# Patient Record
Sex: Male | Born: 1940 | Race: White | Hispanic: No | Marital: Married | Smoking: Former smoker
Health system: Southern US, Community
[De-identification: ages and names within clinical notes are randomized; demographics above are authoritative.]

## PROBLEM LIST (undated history)

## (undated) DIAGNOSIS — K2210 Ulcer of esophagus without bleeding: Secondary | ICD-10-CM

## (undated) DIAGNOSIS — S065XAA Traumatic subdural hemorrhage with loss of consciousness status unknown, initial encounter: Secondary | ICD-10-CM

## (undated) DIAGNOSIS — M1990 Unspecified osteoarthritis, unspecified site: Secondary | ICD-10-CM

## (undated) DIAGNOSIS — Z720 Tobacco use: Secondary | ICD-10-CM

## (undated) DIAGNOSIS — J189 Pneumonia, unspecified organism: Secondary | ICD-10-CM

## (undated) DIAGNOSIS — Z992 Dependence on renal dialysis: Secondary | ICD-10-CM

## (undated) DIAGNOSIS — I509 Heart failure, unspecified: Secondary | ICD-10-CM

## (undated) DIAGNOSIS — I219 Acute myocardial infarction, unspecified: Secondary | ICD-10-CM

## (undated) DIAGNOSIS — R253 Fasciculation: Secondary | ICD-10-CM

## (undated) DIAGNOSIS — Z972 Presence of dental prosthetic device (complete) (partial): Secondary | ICD-10-CM

## (undated) DIAGNOSIS — R0602 Shortness of breath: Secondary | ICD-10-CM

## (undated) DIAGNOSIS — D649 Anemia, unspecified: Secondary | ICD-10-CM

## (undated) DIAGNOSIS — K449 Diaphragmatic hernia without obstruction or gangrene: Secondary | ICD-10-CM

## (undated) DIAGNOSIS — Z973 Presence of spectacles and contact lenses: Secondary | ICD-10-CM

## (undated) DIAGNOSIS — J3489 Other specified disorders of nose and nasal sinuses: Secondary | ICD-10-CM

## (undated) DIAGNOSIS — K52832 Lymphocytic colitis: Secondary | ICD-10-CM

## (undated) DIAGNOSIS — G629 Polyneuropathy, unspecified: Secondary | ICD-10-CM

## (undated) DIAGNOSIS — K5790 Diverticulosis of intestine, part unspecified, without perforation or abscess without bleeding: Secondary | ICD-10-CM

## (undated) DIAGNOSIS — E039 Hypothyroidism, unspecified: Secondary | ICD-10-CM

## (undated) DIAGNOSIS — K222 Esophageal obstruction: Secondary | ICD-10-CM

## (undated) DIAGNOSIS — S065X9A Traumatic subdural hemorrhage with loss of consciousness of unspecified duration, initial encounter: Secondary | ICD-10-CM

## (undated) DIAGNOSIS — K589 Irritable bowel syndrome without diarrhea: Secondary | ICD-10-CM

## (undated) DIAGNOSIS — I2510 Atherosclerotic heart disease of native coronary artery without angina pectoris: Secondary | ICD-10-CM

## (undated) DIAGNOSIS — Z8489 Family history of other specified conditions: Secondary | ICD-10-CM

## (undated) DIAGNOSIS — R519 Headache, unspecified: Secondary | ICD-10-CM

## (undated) DIAGNOSIS — G4730 Sleep apnea, unspecified: Secondary | ICD-10-CM

## (undated) DIAGNOSIS — E7800 Pure hypercholesterolemia, unspecified: Secondary | ICD-10-CM

## (undated) DIAGNOSIS — C801 Malignant (primary) neoplasm, unspecified: Secondary | ICD-10-CM

## (undated) DIAGNOSIS — J449 Chronic obstructive pulmonary disease, unspecified: Secondary | ICD-10-CM

## (undated) DIAGNOSIS — E119 Type 2 diabetes mellitus without complications: Secondary | ICD-10-CM

## (undated) DIAGNOSIS — H269 Unspecified cataract: Secondary | ICD-10-CM

## (undated) DIAGNOSIS — Z9981 Dependence on supplemental oxygen: Secondary | ICD-10-CM

## (undated) DIAGNOSIS — I469 Cardiac arrest, cause unspecified: Secondary | ICD-10-CM

## (undated) DIAGNOSIS — N186 End stage renal disease: Secondary | ICD-10-CM

## (undated) DIAGNOSIS — N189 Chronic kidney disease, unspecified: Secondary | ICD-10-CM

## (undated) DIAGNOSIS — K219 Gastro-esophageal reflux disease without esophagitis: Secondary | ICD-10-CM

## (undated) DIAGNOSIS — I10 Essential (primary) hypertension: Secondary | ICD-10-CM

## (undated) HISTORY — DX: Lymphocytic colitis: K52.832

## (undated) HISTORY — PX: CORONARY ANGIOPLASTY: SHX604

## (undated) HISTORY — PX: OTHER SURGICAL HISTORY: SHX169

## (undated) HISTORY — DX: Ulcer of esophagus without bleeding: K22.10

## (undated) HISTORY — PX: MULTIPLE TOOTH EXTRACTIONS: SHX2053

## (undated) HISTORY — DX: Cardiac arrest, cause unspecified: I46.9

## (undated) HISTORY — PX: VASECTOMY: SHX75

## (undated) HISTORY — DX: Essential (primary) hypertension: I10

## (undated) HISTORY — DX: Hypothyroidism, unspecified: E03.9

## (undated) HISTORY — DX: Pure hypercholesterolemia, unspecified: E78.00

## (undated) HISTORY — DX: Diaphragmatic hernia without obstruction or gangrene: K44.9

## (undated) HISTORY — DX: Diverticulosis of intestine, part unspecified, without perforation or abscess without bleeding: K57.90

## (undated) HISTORY — DX: Acute myocardial infarction, unspecified: I21.9

## (undated) HISTORY — DX: Type 2 diabetes mellitus without complications: E11.9

## (undated) HISTORY — PX: CHOLECYSTECTOMY: SHX55

## (undated) HISTORY — DX: Esophageal obstruction: K22.2

## (undated) HISTORY — DX: Tobacco use: Z72.0

---

## 2000-12-05 ENCOUNTER — Encounter: Payer: Self-pay | Admitting: Emergency Medicine

## 2000-12-05 ENCOUNTER — Emergency Department (HOSPITAL_COMMUNITY): Admission: EM | Admit: 2000-12-05 | Discharge: 2000-12-05 | Payer: Self-pay | Admitting: Emergency Medicine

## 2001-04-10 ENCOUNTER — Encounter: Payer: Self-pay | Admitting: Internal Medicine

## 2001-04-10 ENCOUNTER — Ambulatory Visit (HOSPITAL_COMMUNITY): Admission: RE | Admit: 2001-04-10 | Discharge: 2001-04-10 | Payer: Self-pay | Admitting: Internal Medicine

## 2001-10-13 ENCOUNTER — Encounter: Payer: Self-pay | Admitting: Internal Medicine

## 2001-10-13 ENCOUNTER — Ambulatory Visit (HOSPITAL_COMMUNITY): Admission: RE | Admit: 2001-10-13 | Discharge: 2001-10-13 | Payer: Self-pay | Admitting: Internal Medicine

## 2002-03-09 ENCOUNTER — Ambulatory Visit (HOSPITAL_COMMUNITY): Admission: RE | Admit: 2002-03-09 | Discharge: 2002-03-09 | Payer: Self-pay | Admitting: Internal Medicine

## 2002-03-09 ENCOUNTER — Encounter: Payer: Self-pay | Admitting: Internal Medicine

## 2003-07-28 ENCOUNTER — Inpatient Hospital Stay (HOSPITAL_COMMUNITY): Admission: AD | Admit: 2003-07-28 | Discharge: 2003-07-30 | Payer: Self-pay | Admitting: Internal Medicine

## 2004-10-22 ENCOUNTER — Emergency Department (HOSPITAL_COMMUNITY): Admission: EM | Admit: 2004-10-22 | Discharge: 2004-10-22 | Payer: Self-pay | Admitting: Emergency Medicine

## 2005-10-31 ENCOUNTER — Ambulatory Visit (HOSPITAL_COMMUNITY): Admission: RE | Admit: 2005-10-31 | Discharge: 2005-10-31 | Payer: Self-pay | Admitting: Internal Medicine

## 2006-02-16 ENCOUNTER — Emergency Department (HOSPITAL_COMMUNITY): Admission: EM | Admit: 2006-02-16 | Discharge: 2006-02-17 | Payer: Self-pay | Admitting: Emergency Medicine

## 2006-05-04 HISTORY — PX: COLONOSCOPY: SHX174

## 2006-05-06 ENCOUNTER — Ambulatory Visit: Payer: Self-pay | Admitting: Internal Medicine

## 2006-05-23 ENCOUNTER — Ambulatory Visit: Payer: Self-pay | Admitting: Internal Medicine

## 2006-05-23 ENCOUNTER — Ambulatory Visit (HOSPITAL_COMMUNITY): Admission: RE | Admit: 2006-05-23 | Discharge: 2006-05-23 | Payer: Self-pay | Admitting: Internal Medicine

## 2006-05-23 ENCOUNTER — Encounter (INDEPENDENT_AMBULATORY_CARE_PROVIDER_SITE_OTHER): Payer: Self-pay | Admitting: Specialist

## 2006-07-20 ENCOUNTER — Emergency Department (HOSPITAL_COMMUNITY): Admission: EM | Admit: 2006-07-20 | Discharge: 2006-07-21 | Payer: Self-pay | Admitting: Emergency Medicine

## 2006-09-04 ENCOUNTER — Ambulatory Visit: Payer: Self-pay | Admitting: Internal Medicine

## 2006-09-09 ENCOUNTER — Ambulatory Visit (HOSPITAL_COMMUNITY): Admission: RE | Admit: 2006-09-09 | Discharge: 2006-09-09 | Payer: Self-pay | Admitting: Internal Medicine

## 2006-10-22 ENCOUNTER — Encounter (INDEPENDENT_AMBULATORY_CARE_PROVIDER_SITE_OTHER): Payer: Self-pay | Admitting: Pulmonary Disease

## 2006-10-22 ENCOUNTER — Ambulatory Visit (HOSPITAL_COMMUNITY): Admission: RE | Admit: 2006-10-22 | Discharge: 2006-10-22 | Payer: Self-pay | Admitting: Pulmonary Disease

## 2006-12-03 ENCOUNTER — Ambulatory Visit (HOSPITAL_COMMUNITY): Admission: RE | Admit: 2006-12-03 | Discharge: 2006-12-03 | Payer: Self-pay | Admitting: Internal Medicine

## 2007-03-07 ENCOUNTER — Ambulatory Visit (HOSPITAL_COMMUNITY): Admission: RE | Admit: 2007-03-07 | Discharge: 2007-03-07 | Payer: Self-pay | Admitting: Pulmonary Disease

## 2007-10-03 HISTORY — PX: ESOPHAGOGASTRODUODENOSCOPY: SHX1529

## 2007-10-07 ENCOUNTER — Ambulatory Visit: Payer: Self-pay | Admitting: Internal Medicine

## 2007-10-20 ENCOUNTER — Ambulatory Visit: Payer: Self-pay | Admitting: Internal Medicine

## 2007-10-20 ENCOUNTER — Ambulatory Visit (HOSPITAL_COMMUNITY): Admission: RE | Admit: 2007-10-20 | Discharge: 2007-10-20 | Payer: Self-pay | Admitting: Internal Medicine

## 2008-01-12 ENCOUNTER — Ambulatory Visit: Payer: Self-pay | Admitting: Internal Medicine

## 2008-06-15 ENCOUNTER — Emergency Department (HOSPITAL_COMMUNITY): Admission: EM | Admit: 2008-06-15 | Discharge: 2008-06-15 | Payer: Self-pay | Admitting: Emergency Medicine

## 2008-09-04 ENCOUNTER — Emergency Department (HOSPITAL_COMMUNITY): Admission: EM | Admit: 2008-09-04 | Discharge: 2008-09-04 | Payer: Self-pay | Admitting: Emergency Medicine

## 2008-09-08 ENCOUNTER — Telehealth (INDEPENDENT_AMBULATORY_CARE_PROVIDER_SITE_OTHER): Payer: Self-pay

## 2008-09-09 DIAGNOSIS — F17200 Nicotine dependence, unspecified, uncomplicated: Secondary | ICD-10-CM | POA: Insufficient documentation

## 2008-09-09 DIAGNOSIS — K219 Gastro-esophageal reflux disease without esophagitis: Secondary | ICD-10-CM | POA: Insufficient documentation

## 2008-09-09 DIAGNOSIS — R498 Other voice and resonance disorders: Secondary | ICD-10-CM | POA: Insufficient documentation

## 2008-09-09 DIAGNOSIS — K222 Esophageal obstruction: Secondary | ICD-10-CM | POA: Insufficient documentation

## 2008-09-09 DIAGNOSIS — E119 Type 2 diabetes mellitus without complications: Secondary | ICD-10-CM | POA: Insufficient documentation

## 2008-09-09 DIAGNOSIS — K449 Diaphragmatic hernia without obstruction or gangrene: Secondary | ICD-10-CM | POA: Insufficient documentation

## 2008-09-09 DIAGNOSIS — I2510 Atherosclerotic heart disease of native coronary artery without angina pectoris: Secondary | ICD-10-CM | POA: Insufficient documentation

## 2008-09-10 ENCOUNTER — Ambulatory Visit: Payer: Self-pay | Admitting: Internal Medicine

## 2008-09-10 DIAGNOSIS — K5900 Constipation, unspecified: Secondary | ICD-10-CM | POA: Insufficient documentation

## 2008-09-10 DIAGNOSIS — R109 Unspecified abdominal pain: Secondary | ICD-10-CM | POA: Insufficient documentation

## 2008-09-15 ENCOUNTER — Encounter: Payer: Self-pay | Admitting: Internal Medicine

## 2009-02-25 ENCOUNTER — Ambulatory Visit (HOSPITAL_COMMUNITY): Admission: RE | Admit: 2009-02-25 | Discharge: 2009-02-25 | Payer: Self-pay | Admitting: Internal Medicine

## 2009-03-15 ENCOUNTER — Ambulatory Visit (HOSPITAL_COMMUNITY): Admission: RE | Admit: 2009-03-15 | Discharge: 2009-03-15 | Payer: Self-pay | Admitting: Internal Medicine

## 2010-02-01 ENCOUNTER — Ambulatory Visit: Payer: Self-pay | Admitting: Internal Medicine

## 2010-02-01 DIAGNOSIS — R197 Diarrhea, unspecified: Secondary | ICD-10-CM | POA: Insufficient documentation

## 2010-02-01 DIAGNOSIS — Z8719 Personal history of other diseases of the digestive system: Secondary | ICD-10-CM | POA: Insufficient documentation

## 2010-02-02 ENCOUNTER — Encounter: Payer: Self-pay | Admitting: Gastroenterology

## 2010-02-03 ENCOUNTER — Encounter: Payer: Self-pay | Admitting: Internal Medicine

## 2010-02-07 ENCOUNTER — Encounter: Payer: Self-pay | Admitting: Internal Medicine

## 2010-04-13 ENCOUNTER — Encounter: Payer: Self-pay | Admitting: Gastroenterology

## 2010-07-04 NOTE — Medication Information (Signed)
Summary: BENTYL 10MG   BENTYL 10MG    Imported By: Hoy Morn 04/13/2010 08:35:22  _____________________________________________________________________  External Attachment:    Type:   Image     Comment:   External Document  Appended Document: BENTYL 10MG     Prescriptions: DICYCLOMINE HCL 10 MG CAPS (DICYCLOMINE HCL) one by mouth three times a day as needed diarrhea.  #90 x 1   Entered and Authorized by:   Laureen Ochs. Bernarda Caffey   Signed by:   Laureen Ochs Bernarda Caffey on 04/13/2010   Method used:   Electronically to        [facility] (ret[facility])       [address]       [place], [place]  [address]       Ph: [phone]       Fax: [phone]   RxID:   [id number]

## 2010-07-04 NOTE — Assessment & Plan Note (Signed)
Summary: PT HAS AN H/O DIVERTICULITIS,ABD CRAMPING,DIARRHEA/CM   Visit Type:  Follow-up Visit Primary Care Provider:  [facility]  Chief Complaint:  F/U diverticulitis and abd cramps/diarrhea.  History of Present Illness: Mr. Moreland is here for further evaluation of abd pain, diarrhea. He has h/o diverticulitis. Last Monday saw Dr. Gerarda Fraction for LLQ crampy/pain. Started on Cipro/Flagyl. Pain progressed throughout lower abd/crampy. Few days later, diarrhea started. BM every 2-3 hours. Urgency. Stools very dark. No nocturnal stools. Appetite poor when first started. Alright, but not best. No heartburn. Some indigestion. Lots of flatulence. Abd pain better. No fever. Fiber on hold for now. Had been doing well with BMs prior to this episode. Wife had diarrhea for one day/cramps. Does not take Nexium every day. No recent ABX, travel abroad.  Only medication changes was stopping Norvasc and HCTZ.   Current Medications (verified): 1)  Metoprolol Tartrate 100 Mg Tabs (Metoprolol Tartrate) .... Two Times A Day 2)  Gemfibrozil 600 Mg Tabs (Gemfibrozil) .... Take 1 Tablet By Mouth Once A Day 3)  Plavix 75 Mg Tabs (Clopidogrel Bisulfate) .... 1/2 Once Daily 4)  Acto 30mg  .... As Needed 5)  Multivitamins  Tabs (Multiple Vitamin) .... Once Daily 6)  Fish Oil .... Daily 7)  Zantac .... As Needed 8)  Immodium .... As Needed 9)  Fiorcet .... As Needed 10)  Lantus 100 Unit/ml Soln (Insulin Glargine) .... 30 Units At Bedtime 11)  Calcium 600 1500 Mg Tabs (Calcium Carbonate) .... Once Daily 12)  Chelated Potassium 99 Mg Tabs (Potassium) .... Once Daily 13)  Align .... One By Mouth Daily For Four Weeks 14)  Metformin Hcl 1000 Mg Tabs (Metformin Hcl) .... One Tablet in The Am 15)  Nexium 40 Mg Cpdr (Esomeprazole Magnesium) .... As Needed 16)  Diovan 320 Mg Tabs (Valsartan) .... Take 1 Tablet By Mouth Once A Day 17)  Fiber Tablets .... One Tablet Daily 18)  Flagyl 500 Mg Tabs (Metronidazole) .... One Tablet Daily 19)   Cipro 500 Mg Tabs (Ciprofloxacin Hcl) .... One Tablet Daily  Allergies (verified): 1)  ! Codeine  Past History:  Past Medical History: H/O GERD and LPR EGD 5/08, Dr. Osie Cheeks Schatzki's ring, circumferential distal esophageal erosions s/p dilation.  CAD s/p multiple stents DM TCS, 12/07, Dr. Ala Bent diverticula, pedunculated polyp mid-sigmoid (adenomatous), next TCS due 05/2011.   Family History: Mother, lung cancer, deceased age 27 Father died with lung cancer, age 33  Social History: Married. Three children. 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 No alcohol or drug use. Positive tob use.   Review of Systems      See HPI  Vital Signs:  Patient profile:   70 year old male Height:      71 inches Weight:      195 pounds BMI:     27.30 Temp:     98.3 degrees F oral Pulse rate:   72 / minute BP sitting:   150 / 80  (left arm) Cuff size:   large  Vitals Entered By: Waldon Merl LPN (August 31, 624THL 2:11 PM)  Physical Exam  General:  Well developed, well nourished, no acute distress. Head:  Normocephalic and atraumatic. Eyes:  sclera nonicteric Mouth:  op moist Neck:  Supple; no masses or thyromegaly. Lungs:  Clear throughout to auscultation. Heart:  Regular rate and rhythm; no murmurs, rubs,  or bruits. Abdomen:  Obese. Positive BS, hyperactive. Soft. Mild tenderness noted in LLQ and mid lower abd. No rebound or guarding. No HSM or masses. no abd  bruit or hernia Extremities:  No clubbing, cyanosis, edema or deformities noted. Neurologic:  Alert and  oriented x4;  grossly normal neurologically. Skin:  Intact without significant lesions or rashes. Psych:  Alert and cooperative. Normal mood and affect.  Impression & Recommendations:  Problem # 1:  DIARRHEA (ICD-787.91) Recent LLQ abd crampy pain, improved on Cipro/Flagyl. Treated empirically for diverticulitis. He has been treated empirically several times for diverticulitis but only CT A/P in [place] was from 2007  and showed no evidence of diverticulitis. He developed diarrhea which as been persistent. ?colitis? Discussed options with patient, including starting with stool studies +/- CT A/P. Patient wants to hold off on CT as long as pain is better. If stools are negative, pain persists, then CT A/P with IV/oral contrast. If stools are negative, pain resolves but diarrhea persistent, then TCS.   Retrieve recent labs from PCP. Patient requested Nexium samples, #20 given.  Orders: T-Culture, Stool ([id number]) T-Culture, C-Diff Toxin A/B [id number]) T-Stool Giardia / Crypto- EIA ([id number]) T-Fecal WBC [id number]) Est. Patient Level III [id number])  Appended Document: PT HAS AN H/O DIVERTICULITIS,ABD CRAMPING,DIARRHEA/CM Labs 01/23/10: Hgb A1C 7.1%. No recent CBC. Await stools.

## 2010-07-04 NOTE — Letter (Signed)
Summary: TCS ORDER  TCS ORDER   Imported By: Sofie Rower 02/07/2010 11:47:44  _____________________________________________________________________  External Attachment:    Type:   Image     Comment:   External Document  Appended Document: TCS ORDER Pt called and cx his tcs.He states he will be going out of town and he will call back to reschedule.

## 2010-07-04 NOTE — Letter (Signed)
Summary: LABS FROM [facility]  LABS FROM [facility]   Imported By: Hoy Morn 02/03/2010 10:27:37  _____________________________________________________________________  External Attachment:    Type:   Image     Comment:   External Document

## 2010-09-18 LAB — URINALYSIS, ROUTINE W REFLEX MICROSCOPIC
Leukocytes, UA: NEGATIVE
Nitrite: NEGATIVE
Protein, ur: >300 mg/dL — AB
Specific Gravity, Urine: >1.03 — ABNORMAL HIGH (ref 1.005–1.030)

## 2010-09-18 LAB — GLUCOSE, CAPILLARY
Glucose-Capillary: 140 mg/dL — ABNORMAL HIGH (ref 70–99)
Glucose-Capillary: 62 mg/dL — ABNORMAL LOW (ref 70–99)

## 2010-09-18 LAB — BASIC METABOLIC PANEL
BUN: 26 mg/dL — ABNORMAL HIGH (ref 6–23)
Chloride: 105 mEq/L (ref 96–112)
Creatinine, Ser: 1.11 mg/dL (ref 0.4–1.5)
GFR calc Af Amer: >60 mL/min (ref >60)

## 2010-09-18 LAB — DIFFERENTIAL
Basophils Relative: 0 % (ref 0–1)
Lymphocytes Relative: 20 % (ref 12–46)
Lymphs Abs: 1.2 10*3/uL (ref 0.7–4.0)
Neutro Abs: 4.5 10*3/uL (ref 1.7–7.7)

## 2010-09-18 LAB — CBC
HCT: 42.7 % (ref 39.0–52.0)
Platelets: 244 10*3/uL (ref 150–400)
RDW: 14.2 % (ref 11.5–15.5)
WBC: 6.2 10*3/uL (ref 4.0–10.5)

## 2010-09-18 LAB — URINE MICROSCOPIC-ADD ON

## 2010-10-17 NOTE — Op Note (Signed)
Thomas [name], Thomas [name]               ACCOUNT NO.:  [id number]   MEDICAL RECORD NO.:  [id number]          PATIENT TYPE:  AMB   LOCATION:  DAY                           FACILITY:  [facility]   PHYSICIAN:  R. Garfield Cornea, M.D. DATE OF BIRTH:  May 13, 1941   DATE OF PROCEDURE:  10/20/2007  DATE OF DISCHARGE:                               OPERATIVE REPORT   Esophagogastroduodenoscopy with Venia Minks dilation.   INDICATIONS FOR PROCEDURE:  A 70 year old gentleman, lifelong smoker  with progressive hoarseness, has had longstanding gastroesophageal  reflux disease symptoms.  He also has some intermittent esophageal  dysphagia to solids.  EGD is now being done.  This approach has been  discussed with the patient at length.  Potential risks, benefits, and  alternatives have been reviewed, questions answered.  He is agreeable.  Please see the documentation in the medical record.   PROCEDURE NOTE:  O2 saturation, blood pressure, pulse, and respirations  monitored throughout the entire procedure.   CONSCIOUS SEDATION:  Versed 4 mg IV and Demerol 75 mg IV in divided  doses. Cetacaine spray for topical pharyngeal anesthesia.   INSTRUMENT:  Pentax video chip system.   FINDINGS:  Examination of tubular esophagus revealed a noncritical  Schatzki's ring and circumferential distal esophageal erosions within 1  cm of the EG junction, there was no Barrett esophagus.  There was a  patchy salmon-colored epithelium of the UES consistent with inlet patch.  EG junction easily traversed with the scope.  Stomach:  Gastric cavity  was empty and insufflated well with air.  Thorough examination of the  gastric mucosa including retroflexion of the proximal stomach and  esophagogastric junction demonstrated only a small hiatal hernia.  Pylorus is patent and easily traversed.  Examination of the bulb, second  portion, revealed no abnormalities.  Therapeutic/diagnostic maneuvers  performed:  A 56-French Maloney dilator was  passed, full insertion with  ease.  Look back revealed no apparent complications related to the  passage of the dilator.  The patient tolerated the procedure well and  was reacted in endoscopy.   IMPRESSION:  Small inlet patch, upper esophageal sphincter not  manipulated, distal esophageal erosion consistent with mild erosive  reflux esophagitis, noncritical Schatzki's ring, status post dilation as  described above, small hiatal hernia, otherwise normal stomach, D1, and  D2.   RECOMMENDATIONS:  1. Mr. Rask __________ acid suppression therapy in the way of proton      pump inhibitor.  For the next 3 months, we will put him on Zegerid      40 mg orally twice daily before breakfast and supper and drop back      to once daily thereafter.   As far as his hoarseness is concerned, he is a lifelong smoker.  It may  or may not be related to gastroesophageal reflux disease.  I feel he  will have an ENT evaluation separately, therefore, I have recommended  that he go see the otolaryngologist for hoarseness as a potential  separate issue for reflux.  1. Followup appointment with [place] in 3 months.      Otelia Limes  Rourk, M.D.  Electronically Signed     RMR/MEDQ  D:  10/20/2007  T:  10/21/2007  Job:  [id number]   cc:   Sherrilee Gilles. Gerarda Fraction, MD  Fax: [phone]

## 2010-10-17 NOTE — H&P (Signed)
[name], [name]               ACCOUNT NO.:  [id number]   MEDICAL RECORD NO.:  [id number]           PATIENT TYPE:  AMB   LOCATION:                                FACILITY:  [facility]   PHYSICIAN:  Edward L. Luan Pulling, M.D.DATE OF BIRTH:  [birth year]   DATE OF ADMISSION:  DATE OF DISCHARGE:  LH                              HISTORY & PHYSICAL   REASON FOR ADMISSION:  For bronchoscopy.   HISTORY:  [name] is a 70 year old who has had cough and congestion  and went to see Dr. Gerarda Fraction, his primary care physician, and eventually  had a CT chest, which shows a spiculated lesion in his left lingula.  He  is undergoing bronchoscopy to determine if he has to get, hopefully, a  tissue diagnosis.  He also has a smaller lesion in his right lung.   PAST MEDICAL HISTORY:  Positive mostly for heart disease.  He has had  cardiac stents several times.  He has had a cardiac arrest, and he also  has a history of pneumonia.   FAMILY HISTORY:  Positive for cancer in both of his parents, with caused  their death, and it sounds like both of these were lung cancer.   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  He smokes about a pack of  cigarettes daily and has for about 40 years.   REVIEW OF SYSTEMS:  Otherwise pretty much negative.   PHYSICAL EXAMINATION:  VITAL SIGNS:  Blood pressure 122/70, pulse 60,  respirations are 18.  He is afebrile.  HEENT:  His pupils are reactive to light and accommodation.  His nose  and throat are clear.  NECK:  Supple without masses, bruits or jugular venous distension.  CHEST:  Fairly clear with minimal rhonchi.  HEART:  Regular without gallop.  ABDOMEN:  Soft without masses.  EXTREMITIES:  No edema.  No clubbing or cyanosis.  NEUROLOGIC:  His CNS is grossly intact.   ASSESSMENT:  He has an abnormal chest CT, he has a long smoking history,  he has a positive family history of lung cancer, so he is going to  undergo a bronchoscopy to try to make a tissue  diagnosis.      Edward L. Luan Pulling, M.D.  Electronically Signed     ELH/MEDQ  D:  10/14/2006  T:  10/15/2006  Job:  [id number]   cc:   Sherrilee Gilles. Gerarda Fraction, MD  Fax: [phone]

## 2010-10-17 NOTE — Assessment & Plan Note (Signed)
NAMEMarland Kitchen  Thomas, [name]                CHART#:  [id number]   DATE:  01/12/2008                       DOB:  1941-04-21   CHIEF COMPLAINT:  Followup of reflux and soft stools.   SUBJECTIVE:  The patient is a 70 year old gentleman who presents today  for a 55-month followup.  He has a history of lifelong smoking with  progressive hoarseness and longstanding GERD.  He was seen at the time  of EGD on Oct 20, 2007.  He had a small inlet patch in the upper  esophageal sphincter area, not manipulated, distal esophageal erosion  consistent with mild erosive reflux esophagitis, noncritical Schatzki  ring dilated, small hiatal hernia.  It was felt that he needed  aggressive PPI therapy.  He has since seen Dr. Jerrell Belfast.  He had  a diagnostic nasolaryngoscopy.  Dr. Wilburn Cornelia felt that he had LPR with  evidence of moderate postglottic erythema with reflux.  He is supposed  to follow up with Dr. Wilburn Cornelia sometime at the end of this month.  The  patient states his hoarseness has improved.  He is much better than he  was previously.  He denies any typical heartburn symptoms.  No dysphagia  or odynophagia.  The only thing that he noticed is a change in his  bowels.  He seemed to be more prone to constipation.  For the past 2-3  months, he has been having more softer loose stools.  He generally has  one formed stool every morning followed by 1-2 softer stools in the  early a.m.  He denies any melena or rectal bleeding.  No nocturnal  stools.  No abdominal pain.  His only change is that he has been on a  course of his antibiotics back in June.  He did have a negative C. diff  when he first started having the symptoms.  He has also been on Zegerid  about the length of time.  The patient has had an intentional weight  loss of approximately 10 pounds due to dietary changes.   CURRENT MEDICATIONS:  See updated list.   ALLERGIES:  CODEINE.   PHYSICAL EXAMINATION:  VITAL SIGNS:  Weight 190, down 8  pounds.  Temperature 97.9, blood pressure 138/78, and pulse 60.  GENERAL:  Pleasant, well-nourished, well-developed Caucasian male in no  acute distress.  SKIN:  Warm and dry.  No jaundice.  HEENT:  Sclerae nonicteric.  Oropharyngeal mucosa moist and pink.  ABDOMEN:  Positive bowel sounds.  Soft, nontender, and nondistended.  No  organomegaly or masses.  No rebound or guarding.  No abdominal bruits or  hernias.   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  His hoarseness has  now resolved.  He has been on proton pump inhibitor b.i.d. for about 3  months.  He has noticed some softer or looser stools, which maybe  related to the Zegerid or even related to a couple of rounds of  antibiotics.  We will initially try probiotics and go from there.   PLAN:  1. Continue Zegerid 40 mg b.i.d. at least until he sees Dr. Wilburn Cornelia      again.  May opt to decrease to once a day dosing after that if he  continues to do well.  2. Trial of probiotics, irritable bowel syndrome advantage, 1 p.o.      daily for 4 weeks, samples provided.  3. Continue his Zegerid, #30 samples provided as well.  4. Office visit on a p.Thomasn. basis.       Thomas [name], P.A.  Electronically Signed     Thomas [name], M.D.  Electronically Signed    LL/MEDQ  D:  01/12/2008  T:  01/12/2008  Job:  [id number]   cc:   Shanon Brow L. Wilburn Cornelia, M.D.  Sherrilee Gilles. Gerarda Fraction, MD

## 2010-10-17 NOTE — Op Note (Signed)
[name], Thomas               ACCOUNT NO.:  [id number]   MEDICAL RECORD NO.:  [id number]          PATIENT TYPE:  AMB   LOCATION:  DAY                           FACILITY:  [facility]   PHYSICIAN:  Edward L. Luan Pulling, M.D.DATE OF BIRTH:  Nov 29, 1940   DATE OF PROCEDURE:  10/22/2006  DATE OF DISCHARGE:                               OPERATIVE REPORT   INDICATION FOR PROCEDURE:  Abnormal chest CT.   HISTORY:  [name] has had a chest CT that was abnormal with a  spiculated lesion in the lingula on the left.  He has a long smoking  history and is undergoing bronchoscopy to try to establish a diagnosis.   PREOPERATIVE DIAGNOSIS:  Abnormal chest CT.   POSTOPERATIVE DIAGNOSIS:  Abnormal chest CT.   PROCEDURE:  Fiberoptic bronchoscopy with biopsy, brushings and washings.   SURGEON:  Edward L. Luan Pulling, M.D.   BODY OF REPORT:  After satisfactory local anesthesia and 5 mg of Versed  intravenously, the bronchoscope was introduced into the right naris.  The upper airway was inspected and found to be within normal limits.  The vocal cords were identified and anesthetized with Xylocaine and the  bronchoscope was introduced into the trachea.  There was a moderate  amount of changes of chronic bronchitis with bronchial pitting and  erythema of the bronchus.  The right lung was inspected and found to be  within normal limits and no endobronchial lesions were seen.  The left  lung was then inspected and found to have no endobronchial lesions.  The  bronchoscope was positioned at the opening of the lingula and using  fluoroscopic guidance, multiple transbronchial biopsies were made.  Washings and brushings were then made.  The patient tolerated the  procedure well and was taken to the recovery room in good condition.      Edward L. Luan Pulling, M.D.  Electronically Signed     ELH/MEDQ  D:  10/22/2006  T:  10/22/2006  Job:  [id number]   cc:   Sherrilee Gilles. Gerarda Fraction, MD  Fax: [phone]

## 2010-10-17 NOTE — H&P (Signed)
Thomas [name], [name]               ACCOUNT NO.:  [id number]   MEDICAL RECORD NO.:  [id number]           PATIENT TYPE:  AMB   LOCATION:  DAY                           FACILITY:  [facility]   PHYSICIAN:  Thomas [name], M.D. DATE OF BIRTH:  [birth year]   DATE OF ADMISSION:  DATE OF DISCHARGE:  LH                              HISTORY & PHYSICAL   CHIEF COMPLAINT:  Losing his voice/hoarseness, reflux, and occasional  dysphagia.   HISTORY OF PRESENT ILLNESS:  The patient is a 69 year old Caucasian  male, smoker x45 years.  He came to see me with the assistance of his  daughter to evaluate a several-month history of raspy, hoarse voice.  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  He recently got a scare and underwent  bronchoscopy by Dr. Luan [name] to evaluate a left lingular lung lesion,  which fortunately turned out to be benign.  He has intermittent  esophageal dysphagia with things like corn bread.  Has not had any  odynophagia or dysphagia.  He has had gastroesophageal reflux disease  symptoms off and on over the years, so he has been on the PPI  previously, but currently denies any symptoms.  He has not lost any  weight.  He has had no melena or hematochezia.  He underwent a  colonoscopy in 2007 by me and was found to have a single adenomatous  polyp, which was removed and some left-sided diverticula.  So far, as he  can recall, he has never had his upper GI tract evaluated.  He is due  for a surveillance colonoscopy in 2013.   SOCIAL HISTORY:  He does not consume alcohol, but has smoked 1 pack  cigarettes daily for the better part of 46 years.   PAST MEDICAL HISTORY:  Coronary artery disease, status post multiple  stent placements.  He is on Plavix.  Status post cholecystectomy.  History of type 2 diabetes mellitus.   MEDICATIONS:  1. Glipizide 10 mg ER b.i.d.  2. Enalapril 10 mg daily.  3. Metoprolol 50 mg in the  morning and in the evening.  4. Gemfibrozil 600 mg b.i.d.  5. Plavix 75 mg daily.  6. Actos 10 mg daily.  7. ASA 81 mg daily.  8. Multivitamin daily.  9. Fish oil daily.  10.Estra-C.  11.Zantac p.Thomasn.  12.Dicyclomine p.Thomasn.  13.Glucophage 1 g daily.   ALLERGIES:  Codeine.   FAMILY HISTORY:  Mother died with liver cancer at 83.  Father died with  lung and liver cancer at 65.   SOCIAL HISTORY:  He is married.  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  No alcohol.  No illicit  drugs.  Tobacco as outlined above.   REVIEW OF SYSTEMS:  No recent chest pain, dyspnea on exertion.  No fever  or chills.  Actually, he says he has gained a little weight in the past  couple of months.  In fact, we have him up 5  pounds from his last weight  on September 04, 2006.   PHYSICAL EXAMINATION:  GENERAL:  A pleasant 70 year old gentleman.  He  does have a little bit of a raspy voice.  VITAL SIGNS:  Weight 198 pounds, height 5 feet 11 inches, temperature  98, BP 130/88, and pulse 60.  HEENT:  No scleral icterus.  Conjunctivae are pink.  Oral cavity, no  lesions.  I do not detect any masses or cervical adenopathy.  CHEST/LUNGS:  Clear to auscultation.  CARDIAC:  Regular rate and rhythm without murmur, gallop, or rub.  ABDOMEN:  Nondistended.  Positive bowel sounds.  Soft, nontender without  appreciable mass or organomegaly.  EXTREMITIES:  No edema.   IMPRESSION:  The patient is a 70 year old lifelong smoker with a change  in his voice over the past couple of months.  He has got long-standing  gastroesophageal reflux disease symptoms and describes intermittent  esophageal dysphagia.  He recently was found to have a benign lesion on  chest CT.   In this setting, I have told the patient that it may well be that he has  a non-GI origin for his hoarseness, and in all likelihood, I will be  ultimately sending him to an otolaryngologist for evaluation.  Having   said that, I do feel that given his age and symptoms and long-standing  tobacco exposure, we ought to go ahead and have an EGD for further  evaluation.  The potential for esophageal dilation has been reviewed.  Risks, benefits, and alternatives have been discussed.  Questions  answered.  He is agreeable.  We will allow him to stay on Plavix and  aspirin.  Further recommendations are to follow.      Thomas [name], M.D.  Electronically Signed     RMR/MEDQ  D:  10/07/2007  T:  10/08/2007  Job:  [id number]

## 2010-10-20 NOTE — Procedures (Signed)
Thomas [name], Thomas [name]               ACCOUNT NO.:  [id number]   MEDICAL RECORD NO.:  [id number]          PATIENT TYPE:  OUT   LOCATION:  RAD                           FACILITY:  [facility]   PHYSICIAN:  Leslye Peer, MD       DATE OF BIRTH:  [birth year]   DATE OF PROCEDURE:  10/31/2005  DATE OF DISCHARGE:                                  ECHOCARDIOGRAM   INDICATION:  Known CAD and hypertension with possibly a recent stroke.   The technical quality of the study is somewhat marginal secondary to patient  body habitus __________.   The aorta measures normally at 3.1 cm.   Left atrium also measures normally at 4.0 cm.  No obvious clots or masses  were appreciated, and the patient appeared to be in sinus rhythm during this  procedure.   The interventricular septum and posterior wall were mildly thickened, and  measured 1.3 cm and 1.2 cm respectively.   The aortic valve is not well visualized but appeared to be grossly  structurally normal with normal opening.  No significant aortic  insufficiency is noted.  Doppler interrogation of the aortic valve is within  normal limits.   The mitral valve also appears structurally normal.  No mitral valve prolapse  is noted.  Trace to mild mitral regurgitation is noted.  Doppler  interrogation of the mitral valve leaflet is within normal limits.   Pulmonic valve is not well visualized.   Tricuspid valve appears grossly structurally normal with mild tricuspid  regurgitation noted.   Left ventricle subjectively appears normal in size.  Overall left systolic  function is normal.  No obvious regional wall motion abnormalities are  noted.  Overall ejection fraction appears normal.  The presence of diastolic  dysfunction is inferred from pulsed wave Doppler across the  mitral valve.   The right atrium and right ventricle appeared normal in size.  Right  ventricular systolic function appeared normal.  There was the suggestion of  mild RVH.    IMPRESSION:  1.  Mild concentric left ventricular hypertrophy.  2.  Trace to mild mitral regurgitation.  3.  Mild tricuspid regurgitation.  4.  Subjectively normal left ventricular size and overall ejection fraction.      No obvious regional wall motion abnormalities were noted.  5.  The presence of diastolic dysfunction is inferred from pulsed wave      Doppler across the mitral valve.  6.  No obvious clots or masses were appreciated, but this study is not      adequate for assessment of subtle findings.  Consider transesophageal      echocardiogram if clinically indicated.           ______________________________  Leslye Peer, MD     AB/MEDQ  D:  10/31/2005  T:  11/01/2005  Job:  [id number]   cc:   Sherrilee Gilles. Gerarda Fraction, MD  Fax: [phone]

## 2010-10-20 NOTE — Op Note (Signed)
Thomas [name], Thomas [name]               ACCOUNT NO.:  [id number]   MEDICAL RECORD NO.:  [id number]          PATIENT TYPE:  AMB   LOCATION:  DAY                           FACILITY:  [facility]   PHYSICIAN:  R. Garfield Cornea, M.D. DATE OF BIRTH:  04-21-1941   DATE OF PROCEDURE:  05/23/2006  DATE OF DISCHARGE:                               OPERATIVE REPORT   PROCEDURE:  Colonoscopy and hot snare polypectomy.   INDICATIONS:  The patient is a 70 year old with self-limiting __________  abdominal pain, bloating recently.  His symptoms have resolved.  He had  a CT scan back in September 2007 for left-sided abdominal pain and was  found to have diverticulosis __________  diverticulitis.  Colonoscopy is  now being done.  There is no history of colorectal neoplasia in any  family members.  Colonoscopy is now being done.  This approach has been  discussed with the patient at length.  The potential risks, benefits and  alternatives have been reviewed, questions answered and he is agreeable.  Please see documentation in the medical record.   DESCRIPTION OF PROCEDURE:  Oxygen saturation, blood pressure, pulse and  respiration were monitored throughout the entire procedure.  Conscious  sedation with Demerol 50 mg and Versed 3 mg IV in divided doses.  The  instrument was the Olympus video chip system.   FINDINGS:  Digital rectal exam revealed no abnormalities.   ENDOSCOPIC FINDINGS:  Prep was adequate.   Colon:  Colonic mucosa was surveyed from the rectosigmoid junction  through the left, transverse,  right colon to the area of the  appendiceal orifice, ileocecal valve and cecum.  These structures were  well seen and photographed for the record.  From this level, the scope  was slowly withdrawn.  All previously mentioned mucosal surfaces were  again seen.  The patient had left-sided diverticula.  He had a 7 mm bi-  lobed polyp on a stalk in the mid sigmoid colon which was engaged with  snare removal and  hot snare cautery technique.  The remaining colonic  mucosa appeared normal.  The scope was pulled down to the rectum where a  thorough examination of the rectal mucosa and retroflexed view of the  anal verge was undertaken.  The patient had a normal-appearing rectum.  The patient tolerated the procedure well and reactive after endoscopy.   IMPRESSION:  1. Normal rectum and sigmoid diverticula.  2. Pedunculated polyp, mid sigmoid colon removed with hot snare      technique.  3. Otherwise normal colon.   RECOMMENDATIONS:  1. No aspirin or __________  medications for the next five days.  2. Followup on pathology.  3. Diverticulosis literature provided to Mr. Hardcastle.  4. Further recommendations to follow.      Bridgette Habermann, M.D.  Electronically Signed     RMR/MEDQ  D:  05/23/2006  T:  05/24/2006  Job:  [id number]   cc:   Sherrilee Gilles. Gerarda Fraction, MD  Fax: [phone]

## 2010-10-20 NOTE — Consult Note (Signed)
Thomas [name], Thomas [name]               ACCOUNT NO.:  [id number]   MEDICAL RECORD NO.:  [id number]          PATIENT TYPE:  AMB   LOCATION:  DAY                           FACILITY:  [facility]   PHYSICIAN:  R. Garfield Cornea, M.D. DATE OF BIRTH:  1941/03/23   DATE OF CONSULTATION:  05/06/2006  DATE OF DISCHARGE:                                 CONSULTATION   REASON FOR CONSULTATION:  Abdominal pain, need for colonoscopy.   HISTORY OF PRESENT ILLNESS:  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 Kerin Perna to further evaluate a week or so history  of bilateral left greater than right lower abdominal pain and bloating,  which occurred approximately 6-8 weeks ago.  He states that he has never  really had symptoms like this.  His bowel function slowed down during  this period of time, but then things resolved spontaneously.  He has  been taking an over-the-counter fiber supplement when he feels he needs  it.  He is generally moving his bowels once daily and has not had any  melena or rectal bleeding.  He has no abdominal pain currently.  He saw  Dr. Domingo Cocking __________ back on February 17, 2006 for left-sided  abdominal pain.  He was found to have diverticulosis without  diverticulitis and CT.  A slight amount of bronchiectasis, right lower  lobe, and indeterminate low density of lesions in both kidneys, likely  representing cysts.  He has never had his lower GI tract imaged.  There  is no family history of colorectal neoplasia.  He does not have any  upper GI tract symptoms such as odynophagia, dysphagia, early satiety,  reflux symptoms, nausea or vomiting.  He tells me he has gained a good  25 pounds in the past one year.  I also note that his diabetes has been  under relatively poor control, as evidenced by an hemoglobin A1C of 9.3  back in August of this year.  He now takes Actos as well as glipizide.   There is no family history of GI neoplasia.   PAST MEDICAL HISTORY:  1. Type 2 diabetes mellitus.  2. History of coronary artery disease.  He is status post four stent      placements, one in 1991.  He had three placed in October, 2006.  He      is on Plavix and aspirin.  3. 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  No      symptoms related to that diagnosis resolved.   PAST SURGICAL HISTORY:  1. Cholecystectomy.  2. Heart catheterization.   CURRENT MEDICATIONS:  1. Glipizide 10 mg b.i.d.  2. Enalapril 10 mg daily.  3. Metoprolol 100 mg 1/2 tablet in the morning and in the evening.  4. Gemfibrozil 600 mg b.i.d.  5. Plavix 75 mg daily.  6. Actos 10 mg daily.  7. ASA 81 mg daily.  8.  Multivitamin daily.  9. Fish oil daily.  10.Cinnamon daily.  11.Ester-C daily.   ALLERGIES:  CODEINE.   FAMILY HISTORY:  Both mother and father succumbed to lung cancer.  They  were both smokers.  No history of chronic GI or liver illness.  Has  three sisters and one brother alive in good health.   SOCIAL HISTORY:  Patient is married and has three children.  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  He smokes one pack of cigarettes per  day and has done so for years.  No alcohol or illicit drugs.   REVIEW OF SYSTEMS:  No recent chest pain, dyspnea on exertion.  Weight  gain, as outlined above.  No fevers or chills.  Otherwise as in history  of present illness.   PHYSICAL EXAMINATION:  GENERAL:  A pleasant 70 year old gentleman  resting comfortably.  VITAL SIGNS:  Weight 196.5.  Height 5 feet 11.  Temp 97.9, BP 130/70,  pulse 64.  SKIN:  Warm and dry.  No jaundice.  No acute stigmata of chronic liver  disease.  HEENT:  No scleral icterus.  Conjunctivae are pink.  Oral cavity:  No  lesions.  He has a partial plate below and a full set of dentures above.  CHEST:  Lungs are clear to auscultation.  HEART:   Regular rate and rhythm without murmur, gallop or rub.  ABDOMEN:  Nondistended.  Positive bowel sounds.  Soft, nontender without  appreciable mass or organomegaly.  EXTREMITIES:  No edema.  RECTAL:  Deferred until the time of colonoscopy.   IMPRESSION:  [name] is a pleasant 70 year old gentleman with  a recent self-limiting bout of lower abdominal pain/bloating.  These  symptoms are somewhat nonspecific.  Findings of CT scanning are somewhat  reassuring.  This gentleman needs to have a screening colonoscopy now.  He takes fiber only intermittently, which is probably not the best  approach for colonic harmony.   RECOMMENDATIONS:  Screening colonoscopy in the near future.  Potential  risks, benefits and alternatives have been reviewed.  Will make further  recommendations in the very near future.   I would like to thank Dr. Kerin Perna for allowing me to see this nice  gentleman today.      Bridgette Habermann, M.D.  Electronically Signed     RMR/MEDQ  D:  05/06/2006  T:  05/06/2006  Job:  [id number]   cc:   Sherrilee Gilles. Gerarda Fraction, MD  Fax: [phone]

## 2010-10-20 NOTE — Discharge Summary (Signed)
NAME:  Thomas [name], Thomas [name]                         ACCOUNT NO.:  [id number]   MEDICAL RECORD NO.:  [id number]                   PATIENT TYPE:  INP   LOCATION:  A220                                 FACILITY:  [facility]   PHYSICIAN:  Sherrilee Gilles. Gerarda Fraction, M.D.             DATE OF BIRTH:  25-Oct-1940   DATE OF ADMISSION:  07/28/2003  DATE OF DISCHARGE:  07/30/2003                                 DISCHARGE SUMMARY   DISCHARGE DIAGNOSES:  1. Pneumonia.  2. Diabetes mellitus, type 2.  3. Hypertension.  4. Hyperlipidemia.  5. Coronary artery disease.  6. Status post coronary angioplasty.   DISCHARGE MEDICATIONS:  1. Levaquin 500 mg p.o. daily.  2. Prednisone Dosepak tapered over 1 week.  3. His usual home medications.   SUMMARY:  The patient was admitted with cough, sputum, fever, and chills  through the emergency department at [facility].  He had adequate outpatient  therapy, however, progressed in his symptoms.  He had marked bronchospasm  and tachypnea and was subsequently brought in for IV antibiotics and  bronchodilator therapy.  He responded well to intervention and was  discharged asymptomatic.   FOLLOW UP:  In the office.     ___________________________________________                                         Sherrilee Gilles. Gerarda Fraction, M.D.   LJF/MEDQ  D:  08/09/2003  T:  08/09/2003  Job:  [id number]

## 2010-10-20 NOTE — H&P (Signed)
NAME:  Thomas [name], Thomas [name]                         ACCOUNT NO.:  [id number]   MEDICAL RECORD NO.:  [id number]                   PATIENT TYPE:  INP   LOCATION:  A220                                 FACILITY:  [facility]   PHYSICIAN:  Sherrilee Gilles. Gerarda Fraction, M.D.             DATE OF BIRTH:  12/19/1940   DATE OF ADMISSION:  07/28/2003  DATE OF DISCHARGE:                                HISTORY & PHYSICAL   CHIEF COMPLAINT:  Cough with sputum.   HISTORY OF PRESENT ILLNESS:  The patient has had chills, fever, congestion,  cough, shortness of breath, and wheezing for at least 4 days.  He presented  to the emergency department at [facility] and was treated  with antibiotics.  In spite of antibiotic therapy as an outpatient he  progressively had worsening symptoms of cough and sputum production as well  as malaise.  He denies any hematemesis, hematochezia or melena; although  initially his illness did begin with some nausea and vomiting. He denied any  abdominal pain at the present time.   PAST MEDICAL HISTORY:  1. Non-insulin-dependent diabetes.  2. Hypertension.  3. Hyperlipidemia.  4. Status post cholecystectomy.  5. Coronary artery disease status post stenting.   SOCIAL HISTORY:  He is married and lives with his wife.  Positive for  cigarette smoking.  Negative for alcohol or other drug use.   FAMILY HISTORY:  Positive for cancer in both his mother and father, deceased  at present.  Coronary artery disease is also noted.  Diabetes mellitus is  positive in his family history as well.   REVIEW OF SYSTEMS:  As under HPI, otherwise negative.   PHYSICAL EXAMINATION:  GENERAL:  He appears ill.  HEAD AND NECK:  No JVD or adenopathy.  Neck was supple.  CHEST:  Diffuse inspiratory and expiratory wheezing, scattered rhonchi in  all fields, bibasilar rales.  CARDIAC:  Regular rhythm without gallop or rub.  ABDOMEN:  Soft, no organomegaly or masses.  EXTREMITIES:  Without clubbing,  cyanosis, or edema.  NEUROLOGIC:  Examination is nonfocal.   LABORATORY STUDIES:  Pending at present, will be reviewed when available.   IMPRESSION:  1. Clinical pneumonia, complicated by a bronchospasm and shortness of     breath; will be admitted for bronchodilator therapy and IV antibiotic     therapy.  2. Diabetes mellitus, monitor sugars on IV steroids with sliding scale as     needed.  3. Hyperlipidemia, expectant observation.  4. Hypertension, monitor.     ___________________________________________                                         Sherrilee Gilles. Gerarda Fraction, M.D.   LJF/MEDQ  D:  07/28/2003  T:  07/28/2003  Job:  [id number]

## 2011-03-21 ENCOUNTER — Encounter: Payer: Self-pay | Admitting: Internal Medicine

## 2011-06-05 DIAGNOSIS — K52832 Lymphocytic colitis: Secondary | ICD-10-CM

## 2011-06-05 DIAGNOSIS — K5790 Diverticulosis of intestine, part unspecified, without perforation or abscess without bleeding: Secondary | ICD-10-CM

## 2011-06-05 HISTORY — DX: Lymphocytic colitis: K52.832

## 2011-06-05 HISTORY — DX: Diverticulosis of intestine, part unspecified, without perforation or abscess without bleeding: K57.90

## 2011-07-02 ENCOUNTER — Ambulatory Visit (HOSPITAL_COMMUNITY)
Admission: RE | Admit: 2011-07-02 | Discharge: 2011-07-02 | Disposition: A | Discharge status: Home / Self Care | Payer: Medicare Other | Source: Ambulatory Visit | Attending: Internal Medicine | Admitting: Internal Medicine

## 2011-07-02 ENCOUNTER — Other Ambulatory Visit (HOSPITAL_COMMUNITY): Payer: Self-pay | Admitting: Internal Medicine

## 2011-07-02 DIAGNOSIS — R05 Cough: Secondary | ICD-10-CM

## 2011-07-02 DIAGNOSIS — R059 Cough, unspecified: Secondary | ICD-10-CM

## 2011-07-02 DIAGNOSIS — R0602 Shortness of breath: Secondary | ICD-10-CM | POA: Insufficient documentation

## 2011-07-02 DIAGNOSIS — J984 Other disorders of lung: Secondary | ICD-10-CM | POA: Insufficient documentation

## 2011-12-25 LAB — COMPREHENSIVE METABOLIC PANEL
Albumin: 4
BUN: 41 mg/dL — AB (ref 4–21)
CO2: 24 mmol/L
Calcium: 9.2 mg/dL
Chloride: 108 mmol/L
Glucose: 126
Potassium: 5 mmol/L

## 2012-01-09 ENCOUNTER — Ambulatory Visit (INDEPENDENT_AMBULATORY_CARE_PROVIDER_SITE_OTHER): Payer: Medicare Other | Admitting: Gastroenterology

## 2012-01-09 ENCOUNTER — Encounter: Payer: Self-pay | Admitting: Gastroenterology

## 2012-01-09 VITALS — BP 167/83 | HR 70 | Temp 97.6°F | Ht 71.0 in | Wt 183.4 lb

## 2012-01-09 DIAGNOSIS — R197 Diarrhea, unspecified: Secondary | ICD-10-CM

## 2012-01-09 LAB — BASIC METABOLIC PANEL
Calcium: 9.4 mg/dL (ref 8.4–10.5)
Chloride: 106 mEq/L (ref 96–112)
Creat: 2.9 mg/dL — ABNORMAL HIGH (ref 0.50–1.35)
Potassium: 4.2 mEq/L (ref 3.5–5.3)

## 2012-01-09 MED ORDER — DICYCLOMINE HCL 10 MG PO CAPS: 10.0000 mg | ORAL_CAPSULE | Freq: Three times a day (TID) | ORAL | Status: DC

## 2012-01-09 NOTE — Progress Notes (Signed)
Referring Provider: Glo Herring., MD Primary Care Physician:  Glo Herring., MD Primary Gastroenterologist: Dr. Gala Romney   Chief Complaint  Patient presents with  . Diarrhea    HPI:   Thomas [name] is a 71 year old male who presents today secondary to diarrhea. Notes 3 weeks of loose stools.Watery, "acidy", feels gassy. Weak. Intermittent nausea, no vomiting. LLQ discomfort when bloated. No pain today. Notes 2-3 lbs of wt loss. Feels like "everything goes straight through". No rectal bleeding. Given Lomotil by PCP. No abx in past few months. [place] water, filtration system. No sick contacts. Urine clear. +dry mouth. Weak. Drinking fluids.   Last TCS in 2007: adenomatous polyps.  Outside labs:  BUN 41 Cr 3.15 Na 141 K 5 Hgb 12.9 (low) Hct 36.1 (low) TSH normal 3.555  [place]: Nephrologist.   Past Medical History  Diagnosis Date  . HTN (hypertension)   . Diabetes mellitus   . Hypercholesterolemia   . Myocardial infarction     X2  . Cardiac arrest     Past Surgical History  Procedure Date  . Cholecystectomy   . Cardiac stents     5  . Coronary angioplasty   . Esophagogastroduodenoscopy May 2009    RMR: non-critical Schatzki's ring, sp dilation with 28 F Maloney, small inlet patch, ild erosive relufx esophagitis  . Colonoscopy December 2007    adenomatous polyps, sigmoid diverticula    Current Outpatient Prescriptions  Medication Sig Dispense Refill  . diphenoxylate-atropine (LOMOTIL) 2.5-0.025 MG per tablet Take 1 tablet by mouth every 6 (six) hours.       . furosemide (LASIX) 40 MG tablet Take 40 mg by mouth daily.       Marland Kitchen gemfibrozil (LOPID) 600 MG tablet Take 600 mg by mouth daily.      Marland Kitchen JANUVIA 50 MG tablet Take 50 mg by mouth daily.       Marland Kitchen LANTUS SOLOSTAR 100 UNIT/ML injection Inject 20 Units into the skin at bedtime.       . metoprolol (LOPRESSOR) 100 MG tablet Take 100 mg by mouth 2 (two) times daily.       . pravastatin (PRAVACHOL) 40 MG tablet  Take 40 mg by mouth daily.       Marland Kitchen dicyclomine (BENTYL) 10 MG capsule Take 1 capsule (10 mg total) by mouth 3 (three) times daily before meals.  90 capsule  0    Allergies as of 01/09/2012 - Review Complete 01/09/2012  Allergen Reaction Noted  . Codeine      Family History  Problem Relation Age of Onset  . Colon cancer Neg Hx     History   Social History  . Marital Status: Married    Spouse Name: N/A    Number of Children: N/A  . Years of Education: N/A   Social History Main Topics  . Smoking status: Current Everyday Smoker -- 1.0 packs/day    Types: Cigarettes  . Smokeless tobacco: None  . Alcohol Use: No  . Drug Use: No  . Sexually Active: None   Other Topics Concern  . None   Social History Narrative  . None    Review of Systems: Gen: SEE HPI CV: Denies chest pain, palpitations, syncope, peripheral edema, and claudication. Resp: +cough GI: Denies vomiting blood, jaundice, and fecal incontinence.   Denies dysphagia or odynophagia. Derm: Denies rash, itching, dry skin Psych: Denies depression, anxiety, memory loss, confusion. No homicidal or suicidal ideation.  Heme: Denies bruising, bleeding, and enlarged lymph nodes.  Physical Exam: BP 167/83  Pulse 70  Temp 97.6 F (36.4 C) (Temporal)  Ht 5\' 11"  (1.803 m)  Wt 183 lb 6.4 oz (83.19 kg)  BMI 25.58 kg/m2 General:   Alert and oriented. No distress noted. Pleasant and cooperative.  Head:  Normocephalic and atraumatic. Eyes:  Conjuctiva clear without scleral icterus. Mouth:  Oral mucosa pink and moist.No lesions. Neck:  Supple, without mass or thyromegaly. Heart:  S1, S2 present without murmurs, rubs, or gallops. Regular rate and rhythm. Abdomen:  +BS, soft, non-tender and non-distended. No rebound or guarding. No HSM or masses noted. Msk:  Symmetrical without gross deformities. Normal posture. Extremities:  Without edema. Neurologic:  Alert and  oriented x4;  grossly normal neurologically. Skin:  Intact  without significant lesions or rashes. Cervical Nodes:  No significant cervical adenopathy. Psych:  Alert and cooperative. Normal mood and affect.

## 2012-01-09 NOTE — Patient Instructions (Addendum)
Complete blood work today.   Complete stool studies and return to the lab as soon as possible. We will call with the results.  Follow the bland diet. I have sent a prescription for Bentyl to take before meals 2-3 times per day. You may take Kaopectate as well as needed.  We have set you up for a colonoscopy in the future.   Seek medical attention if worsening diarrhea, rectal bleeding, worsening abdominal pain   Diabetes medication prior to colonoscopy: Take only 1/2 dose of Lantus the evening before No diabetes medication the day of

## 2012-01-11 LAB — CLOSTRIDIUM DIFFICILE BY PCR: Toxigenic C. Difficile by PCR: NOT DETECTED

## 2012-01-14 ENCOUNTER — Encounter: Payer: Self-pay | Admitting: Gastroenterology

## 2012-01-14 ENCOUNTER — Other Ambulatory Visit: Payer: Self-pay | Admitting: Internal Medicine

## 2012-01-14 DIAGNOSIS — R197 Diarrhea, unspecified: Secondary | ICD-10-CM

## 2012-01-14 DIAGNOSIS — Z1211 Encounter for screening for malignant neoplasm of colon: Secondary | ICD-10-CM

## 2012-01-14 MED ORDER — PEG 3350-KCL-NA BICARB-NACL 420 G PO SOLR: 4000.0000 L | ORAL | Status: AC

## 2012-01-14 NOTE — Assessment & Plan Note (Addendum)
71 year old with several weeks of loose stools, increase in frequency from normal, nausea, several pounds wt loss. LLQ discomfort intermittent, specifically when feeling "bloated". No rectal bleeding. No acute findings today no physical exam. Denies exposure to abx. Last TCS in 2007, adenomatous polyps. Somewhat overdue for surveillance. Upon review of medical records, appears has had similar presentation in past with change in bowel habits, abdominal pain. Question underlying IBS. Need to obtain stool studies and set up for TCS.   Cdiff PCR, Culture, Giardia, Lactoferrin BMP  Proceed with TCS with Dr. Gala Romney in near future: the risks, benefits, and alternatives have been discussed with the patient in detail. The patient states understanding and desires to proceed. Add Bentyl low-dose, BID Stop Lomotil. Kaopectate prn Low-residue diet until stool studies return Further recommendations after stool studies complete.

## 2012-01-14 NOTE — Progress Notes (Signed)
Faxed to PCP

## 2012-01-22 NOTE — Progress Notes (Signed)
Quick Note:  BUN, Cr improved from previous lab via PCP (BUN was 41, Cr 3.15). Please fax these to PCP.  Stool studies all negative.  Proceed with TCS as planned. ______

## 2012-01-23 ENCOUNTER — Encounter (HOSPITAL_COMMUNITY): Payer: Self-pay | Admitting: Pharmacy Technician

## 2012-01-23 NOTE — Progress Notes (Signed)
Faxed to PCP

## 2012-02-05 MED ORDER — SODIUM CHLORIDE 0.45 % IV SOLN
INTRAVENOUS | Status: DC
  Administered 2012-02-06: 11:00:00 via INTRAVENOUS

## 2012-02-05 MED ORDER — SODIUM CHLORIDE 0.9 % IV SOLN: INTRAVENOUS | Status: DC

## 2012-02-06 ENCOUNTER — Encounter (HOSPITAL_COMMUNITY): Payer: Self-pay

## 2012-02-06 ENCOUNTER — Encounter (HOSPITAL_COMMUNITY)
Admission: RE | Disposition: A | Discharge status: Home / Self Care | Payer: Self-pay | Source: Ambulatory Visit | Attending: Internal Medicine

## 2012-02-06 ENCOUNTER — Ambulatory Visit (HOSPITAL_COMMUNITY)
Admission: RE | Admit: 2012-02-06 | Discharge: 2012-02-06 | Disposition: A | Discharge status: Home / Self Care | Payer: Medicare Other | Source: Ambulatory Visit | Attending: Internal Medicine | Admitting: Internal Medicine

## 2012-02-06 DIAGNOSIS — R197 Diarrhea, unspecified: Secondary | ICD-10-CM | POA: Insufficient documentation

## 2012-02-06 DIAGNOSIS — K5730 Diverticulosis of large intestine without perforation or abscess without bleeding: Secondary | ICD-10-CM

## 2012-02-06 DIAGNOSIS — I10 Essential (primary) hypertension: Secondary | ICD-10-CM | POA: Insufficient documentation

## 2012-02-06 DIAGNOSIS — K5289 Other specified noninfective gastroenteritis and colitis: Secondary | ICD-10-CM | POA: Insufficient documentation

## 2012-02-06 DIAGNOSIS — E119 Type 2 diabetes mellitus without complications: Secondary | ICD-10-CM | POA: Insufficient documentation

## 2012-02-06 DIAGNOSIS — Z860100 Personal history of colon polyps, unspecified: Secondary | ICD-10-CM | POA: Insufficient documentation

## 2012-02-06 DIAGNOSIS — Z79899 Other long term (current) drug therapy: Secondary | ICD-10-CM | POA: Insufficient documentation

## 2012-02-06 DIAGNOSIS — Z1211 Encounter for screening for malignant neoplasm of colon: Secondary | ICD-10-CM

## 2012-02-06 DIAGNOSIS — Z794 Long term (current) use of insulin: Secondary | ICD-10-CM | POA: Insufficient documentation

## 2012-02-06 DIAGNOSIS — E7800 Pure hypercholesterolemia, unspecified: Secondary | ICD-10-CM | POA: Insufficient documentation

## 2012-02-06 HISTORY — PX: COLONOSCOPY: SHX5424

## 2012-02-06 SURGERY — COLONOSCOPY
Anesthesia: Moderate Sedation

## 2012-02-06 MED ORDER — MEPERIDINE HCL 100 MG/ML IJ SOLN
INTRAMUSCULAR | Status: DC | PRN
  Administered 2012-02-06: 25 mg via INTRAVENOUS
  Administered 2012-02-06: 50 mg via INTRAVENOUS

## 2012-02-06 MED ORDER — MIDAZOLAM HCL 5 MG/5ML IJ SOLN
INTRAMUSCULAR | Status: AC
  Filled 2012-02-06: qty 10

## 2012-02-06 MED ORDER — STERILE WATER FOR IRRIGATION IR SOLN
Status: DC | PRN
  Administered 2012-02-06: 11:00:00

## 2012-02-06 MED ORDER — MIDAZOLAM HCL 5 MG/5ML IJ SOLN
INTRAMUSCULAR | Status: DC | PRN
  Administered 2012-02-06 (×2): 1 mg via INTRAVENOUS
  Administered 2012-02-06: 2 mg via INTRAVENOUS

## 2012-02-06 MED ORDER — MEPERIDINE HCL 100 MG/ML IJ SOLN
INTRAMUSCULAR | Status: AC
  Filled 2012-02-06: qty 2

## 2012-02-06 NOTE — Op Note (Signed)
[place], [id number]   COLONOSCOPY PROCEDURE REPORT  PATIENT: Thomas [name], Thomas [name]  Mr  MR#:         [id number] BIRTHDATE: 1941-03-15 , 71  yrs. old GENDER: Male ENDOSCOPIST: R.  Garfield Cornea, MD FACP FACG REFERRED BY:  Kerin Perna, M.D. PROCEDURE DATE:  02/06/2012 PROCEDURE:    Colonoscopy was a little biopsy  INDICATIONS: History of colonic adenoma; recent course of protracted diarrhea with negative stool studies-improved in just the past couple of weeks  INFORMED CONSENT:  The risks, benefits, alternatives and imponderables including but not limited to bleeding, perforation as well as the possibility of a missed lesion have been reviewed.  The potential for biopsy, lesion removal, etc. have also been discussed.  Questions have been answered.  All parties agreeable. Please see the history and physical in the medical record for more information.  MEDICATIONS: Versed 4 mg IV Demerol 75 mg IV in divided doses.  DESCRIPTION OF PROCEDURE:  After a digital rectal exam was performed, the Pentax Colonoscope [phone]  colonoscope was advanced from the anus through the rectum and colon to the area of the cecum, ileocecal valve and appendiceal orifice.  The cecum was deeply intubated.  These structures were well-seen and photographed for the record.  From the level of the cecum and ileocecal valve, the scope was slowly and cautiously withdrawn.  The mucosal surfaces were carefully surveyed utilizing scope tip deflection to facilitate fold flattening as needed.  The scope was pulled down into the rectum where a thorough examination including retroflexion was performed.    FINDINGS:  adequate preparation. Normal rectum. Scattered pancolonic diverticula; the remainder of the colonic mucosa appeared normal.  THERAPEUTIC / DIAGNOSTIC MANEUVERS PERFORMED:  segment biopsies of the ascending and descending colonic segments taken to evaluate  for microscopic colitis.  COMPLICATIONS: none  CECAL WITHDRAWAL TIME:  10 minutes  IMPRESSION:  Colonic diverticulosis-status post segmental biopsy  RECOMMENDATIONS: repeat surveillance colonoscopy in 5 years-given history of colonic adenomas. Further recommendations to follow pending review of pathology report.   _______________________________ eSigned:  R. Garfield Cornea, MD FACP [facility] 02/06/2012 11:56 AM   CC:

## 2012-02-10 ENCOUNTER — Encounter: Payer: Self-pay | Admitting: Internal Medicine

## 2012-02-11 ENCOUNTER — Encounter (HOSPITAL_COMMUNITY): Payer: Self-pay | Admitting: Internal Medicine

## 2012-02-11 ENCOUNTER — Other Ambulatory Visit: Payer: Self-pay

## 2012-02-11 ENCOUNTER — Encounter: Payer: Self-pay | Admitting: *Deleted

## 2012-02-11 MED ORDER — BUDESONIDE 3 MG PO CP24: ORAL_CAPSULE | ORAL | Status: DC

## 2012-02-12 NOTE — H&P (Signed)
Thomas Emperor, NP 01/14/2012 2:52 PM Signed  Referring Provider: Glo Herring., MD  Primary Care Physician: Glo Herring., MD  Primary Gastroenterologist: Dr. Gala Romney  Chief Complaint   Patient presents with   .  Diarrhea    HPI:  Thomas [name] is a 71 year old male who presents today secondary to diarrhea. Notes 3 weeks of loose stools.Watery, "acidy", feels gassy. Weak. Intermittent nausea, no vomiting. LLQ discomfort when bloated. No pain today. Notes 2-3 lbs of wt loss. Feels like "everything goes straight through". No rectal bleeding. Given Lomotil by PCP. No abx in past few months. [place] water, filtration system. No sick contacts. Urine clear. +dry mouth. Weak. Drinking fluids.  Last TCS in 2007: adenomatous polyps.  Outside labs:  BUN 41  Cr 3.15  Na 141  K 5  Hgb 12.9 (low)  Hct 36.1 (low)  TSH normal 3.555  [place]: Nephrologist.  Past Medical History   Diagnosis  Date   .  HTN (hypertension)    .  Diabetes mellitus    .  Hypercholesterolemia    .  Myocardial infarction      X2   .  Cardiac arrest     Past Surgical History   Procedure  Date   .  Cholecystectomy    .  Cardiac stents      5   .  Coronary angioplasty    .  Esophagogastroduodenoscopy  May 2009     RMR: non-critical Schatzki's ring, sp dilation with 61 F Maloney, small inlet patch, ild erosive relufx esophagitis   .  Colonoscopy  December 2007     adenomatous polyps, sigmoid diverticula    Current Outpatient Prescriptions   Medication  Sig  Dispense  Refill   .  diphenoxylate-atropine (LOMOTIL) 2.5-0.025 MG per tablet  Take 1 tablet by mouth every 6 (six) hours.     .  furosemide (LASIX) 40 MG tablet  Take 40 mg by mouth daily.     Marland Kitchen  gemfibrozil (LOPID) 600 MG tablet  Take 600 mg by mouth daily.     Marland Kitchen  JANUVIA 50 MG tablet  Take 50 mg by mouth daily.     Marland Kitchen  LANTUS SOLOSTAR 100 UNIT/ML injection  Inject 20 Units into the skin at bedtime.     .  metoprolol (LOPRESSOR) 100 MG tablet  Take 100 mg  by mouth 2 (two) times daily.     .  pravastatin (PRAVACHOL) 40 MG tablet  Take 40 mg by mouth daily.     Marland Kitchen  dicyclomine (BENTYL) 10 MG capsule  Take 1 capsule (10 mg total) by mouth 3 (three) times daily before meals.  90 capsule  0    Allergies as of 01/09/2012 - Review Complete 01/09/2012   Allergen  Reaction  Noted   .  Codeine      Family History   Problem  Relation  Age of Onset   .  Colon cancer  Neg Hx     History    Social History   .  Marital Status:  Married     Spouse Name:  N/A     Number of Children:  N/A   .  Years of Education:  N/A    Social History Main Topics   .  Smoking status:  Current Everyday Smoker -- 1.0 packs/day     Types:  Cigarettes   .  Smokeless tobacco:  None   .  Alcohol Use:  No   .  Drug Use:  No   .  Sexually Active:  None    Other Topics  Concern   .  None    Social History Narrative   .  None    Review of Systems:  Gen: SEE HPI  CV: Denies chest pain, palpitations, syncope, peripheral edema, and claudication.  Resp: +cough  GI: Denies vomiting blood, jaundice, and fecal incontinence. Denies dysphagia or odynophagia.  Derm: Denies rash, itching, dry skin  Psych: Denies depression, anxiety, memory loss, confusion. No homicidal or suicidal ideation.  Heme: Denies bruising, bleeding, and enlarged lymph nodes.  Physical Exam:  BP 167/83  Pulse 70  Temp 97.6 F (36.4 C) (Temporal)  Ht 5\' 11"  (1.803 m)  Wt 183 lb 6.4 oz (83.19 kg)  BMI 25.58 kg/m2  General: Alert and oriented. No distress noted. Pleasant and cooperative.  Head: Normocephalic and atraumatic.  Eyes: Conjuctiva clear without scleral icterus.  Mouth: Oral mucosa pink and moist.No lesions.  Neck: Supple, without mass or thyromegaly.  Heart: S1, S2 present without murmurs, rubs, or gallops. Regular rate and rhythm.  Abdomen: +BS, soft, non-tender and non-distended. No rebound or guarding. No HSM or masses noted.  Msk: Symmetrical without gross deformities. Normal  posture.  Extremities: Without edema.  Neurologic: Alert and oriented x4; grossly normal neurologically.  Skin: Intact without significant lesions or rashes.  Cervical Nodes: No significant cervical adenopathy.  Psych: Alert and cooperative. Normal mood and affect.   Kerry Fort 01/14/2012 2:55 PM Signed  Faxed to PCP Thomas Emperor, NP 01/22/2012 4:39 PM Signed  Quick Note:  BUN, Cr improved from previous lab via PCP (BUN was 41, Cr 3.15).  Please fax these to PCP.  Stool studies all negative.  Proceed with TCS as planned.  ______ Kerry Fort 01/23/2012 11:44 AM Signed  Faxed to PCP [place], NP 01/14/2012 2:45 PM Addendum  71 year old with several weeks of loose stools, increase in frequency from normal, nausea, several pounds wt loss. LLQ discomfort intermittent, specifically when feeling "bloated". No rectal bleeding. No acute findings today no physical exam. Denies exposure to abx. Last TCS in 2007, adenomatous polyps. Somewhat overdue for surveillance. Upon review of medical records, appears has had similar presentation in past with change in bowel habits, abdominal pain. Question underlying IBS. Need to obtain stool studies and set up for TCS.  Cdiff PCR, Culture, Giardia, Lactoferrin  BMP  Proceed with TCS with Dr. Gala Romney in near future: the risks, benefits, and alternatives have been discussed with the patient in detail. The patient states understanding and desires to proceed.  Add Bentyl low-dose, BID  Stop Lomotil. Kaopectate prn  Low-residue diet until stool studies return  Further recommendations after stool studies complete.

## 2012-02-14 ENCOUNTER — Telehealth: Payer: Self-pay | Admitting: Internal Medicine

## 2012-02-14 NOTE — Telephone Encounter (Signed)
Pt's wife called and the budesonide prescription is costing them $1000 and needs something else. Also, wife insisted on changing OV from AS to RMR ONLY. Please advise. [id number]

## 2012-02-15 NOTE — Progress Notes (Signed)
  Letter from: Daneil Dolin Reason for Letter: Results Review Send letter to patient.  Send copy of letter with path to referring provider and PCP.  To [facility]; need entocort 6 mg daily disp 60 - 3mg  tablets w one refill - ov w AS in 1 month

## 2012-02-15 NOTE — Telephone Encounter (Signed)
Routing to RMR- pt cannot afford entocort. Do you want to change Rx?

## 2012-02-15 NOTE — Progress Notes (Signed)
Ginger sent rx to pharmacy.

## 2012-02-18 NOTE — Telephone Encounter (Signed)
Pt will pick up sample in the morning at his office visit.

## 2012-02-18 NOTE — Telephone Encounter (Signed)
Lets try uceris 9 mg daily x 1 month - lets give hm samples

## 2012-02-19 ENCOUNTER — Ambulatory Visit: Payer: Medicare Other | Admitting: Internal Medicine

## 2012-03-07 ENCOUNTER — Ambulatory Visit (INDEPENDENT_AMBULATORY_CARE_PROVIDER_SITE_OTHER): Payer: Medicare Other | Admitting: Internal Medicine

## 2012-03-07 ENCOUNTER — Encounter: Payer: Self-pay | Admitting: Internal Medicine

## 2012-03-07 VITALS — BP 154/82 | HR 62 | Temp 97.0°F | Ht 71.0 in | Wt 186.0 lb

## 2012-03-07 DIAGNOSIS — K52839 Microscopic colitis, unspecified: Secondary | ICD-10-CM | POA: Insufficient documentation

## 2012-03-07 DIAGNOSIS — K5289 Other specified noninfective gastroenteritis and colitis: Secondary | ICD-10-CM

## 2012-03-07 NOTE — Patient Instructions (Addendum)
Continue Uceris - One tablet daily for 4 more weeks; then drop back to every other day for another month and then stop taking entirely  Call if diarrhea recurs  Colonoscopy in 5 years

## 2012-03-07 NOTE — Progress Notes (Signed)
Primary Care Physician:  Glo Herring., MD Primary Gastroenterologist:  Dr. Gala Romney  Pre-Procedure History & Physical: HPI:  Thomas [name] is a 71 y.o. male here for followup of microscopic colitis. Diarrhea has resolved on budesonide. Taking one Uceris daily recently. History of colonic adenoma but none on recent colonoscopy; he will be due for surveillance colonoscopy in 5 years. Chronic kidney disease being followed by Dr. Hinda Lenis. Had a renal biopsy but no specimen recovered recently-he will need another biopsy.  Past Medical History  Diagnosis Date  . HTN (hypertension)   . Diabetes mellitus   . Hypercholesterolemia   . Myocardial infarction     X2  . Cardiac arrest   . Lymphocytic colitis 2013    tcs by RMR  . Diverticulosis 2013    tcs by RMR    Past Surgical History  Procedure Date  . Cholecystectomy   . Cardiac stents     5  . Coronary angioplasty   . Esophagogastroduodenoscopy May 2009    RMR: non-critical Schatzki's ring, sp dilation with 74 F Maloney, small inlet patch, ild erosive relufx esophagitis  . Colonoscopy December 2007    adenomatous polyps, sigmoid diverticula  . Colonoscopy 02/06/2012    Dr. Gala Romney- colonic diverticulosis, bx consistent with lymphocytic colitis    Prior to Admission medications   Medication Sig Start Date End Date Taking? Authorizing Provider  Budesonide (UCERIS) 9 MG TB24 Take 9 mg by mouth daily.   Yes Historical Provider, MD  cholecalciferol (VITAMIN D) 1000 UNITS tablet Take 1,000 Units by mouth daily.   Yes Historical Provider, MD  cloNIDine (CATAPRES) 0.1 MG tablet Take 0.1 mg by mouth daily.   Yes Historical Provider, MD  FIBER PO Take 1 capsule by mouth daily.   Yes Historical Provider, MD  fish oil-omega-3 fatty acids 1000 MG capsule Take 1 g by mouth 4 (four) times daily.   Yes Historical Provider, MD  furosemide (LASIX) 40 MG tablet Take 40 mg by mouth daily.  12/01/11  Yes Historical Provider, MD  gemfibrozil (LOPID) 600  MG tablet Take 600 mg by mouth daily.   Yes Historical Provider, MD  isosorbide mononitrate (IMDUR) 30 MG 24 hr tablet Take 30 mg by mouth daily.  03/03/12  Yes Historical Provider, MD  JANUVIA 50 MG tablet Take 50 mg by mouth daily.  12/25/11  Yes Historical Provider, MD  LANTUS SOLOSTAR 100 UNIT/ML injection Inject 20 Units into the skin at bedtime as needed. According to sugar level.   Yes Historical Provider, MD  magnesium oxide (MAG-OX) 400 MG tablet Take 400 mg by mouth daily.   Yes Historical Provider, MD  metoprolol (LOPRESSOR) 100 MG tablet Take 100 mg by mouth 2 (two) times daily.  12/07/11  Yes Historical Provider, MD  NIFEDICAL XL 60 MG 24 hr tablet Take 60 mg by mouth daily.  02/06/12  Yes Historical Provider, MD  Potassium 99 MG TABS Take 2 tablets by mouth daily.   Yes Historical Provider, MD  pravastatin (PRAVACHOL) 40 MG tablet Take 40 mg by mouth daily.  12/07/11  Yes Historical Provider, MD  vitamin C (ASCORBIC ACID) 500 MG tablet Take 500 mg by mouth 2 (two) times daily.   Yes Historical Provider, MD  budesonide (ENTOCORT EC) 3 MG 24 hr capsule Take 2 capsules(6mg  total) by mouth every morning. 02/11/12   Daneil Dolin, MD    Allergies as of 03/07/2012 - Review Complete 03/07/2012  Allergen Reaction Noted  . Codeine Other (See Comments)  Family History  Problem Relation Age of Onset  . Colon cancer Neg Hx     History   Social History  . Marital Status: Married    Spouse Name: N/A    Number of Children: N/A  . Years of Education: N/A   Occupational History  . Not on file.   Social History Main Topics  . Smoking status: Current Every Day Smoker -- 1.0 packs/day    Types: Cigarettes  . Smokeless tobacco: Not on file  . Alcohol Use: No  . Drug Use: No  . Sexually Active: Not on file   Other Topics Concern  . Not on file   Social History Narrative  . No narrative on file    Review of Systems: See HPI, otherwise negative ROS  Physical Exam: BP 154/82   Pulse 62  Temp 97 F (36.1 C) (Temporal)  Ht 5\' 11"  (1.803 m)  Wt 186 lb (84.369 kg)  BMI 25.94 kg/m2 General:   Alert,  Well-developed, well-nourished, pleasant and cooperative in NAD Skin:  Intact without significant lesions or rashes. Eyes:  Sclera clear, no icterus.   Conjunctiva pink. Ears:  Normal auditory acuity. Nose:  No deformity, discharge,  or lesions. Mouth:  No deformity or lesions. Neck:  Supple; no masses or thyromegaly. No significant cervical adenopathy. Lungs:  Clear throughout to auscultation.   No wheezes, crackles, or rhonchi. No acute distress. Heart:  Regular rate and rhythm; no murmurs, clicks, rubs,  or gallops. Abdomen: Non-distended, obese. Positive bowel sounds. diastases recti; abdomen is soft nontender mass or organomegaly  Pulses:  Normal pulses noted. Extremities:  Without clubbing or edema.  Impression/Plan:  71 year old gentleman with microscopic colitis now much improved on budesonide. History of colonic adenoma.   Recommend he stay on Uceris for one more month (9 mg once daily); and drop back to once every other day x1 month and then stop. If he has any recurrent diarrhea in the future he is to let me know. Otherwise, we'll put him down for surveillance colonoscopy in 5 years.

## 2012-03-10 ENCOUNTER — Ambulatory Visit: Payer: Medicare Other | Admitting: Gastroenterology

## 2012-04-25 ENCOUNTER — Telehealth: Payer: Self-pay | Admitting: *Deleted

## 2012-04-25 NOTE — Telephone Encounter (Addendum)
Pt was given rx for entocort at the last ov.  Is it ok to give pt uceris samples and rx? Please advise.

## 2012-04-25 NOTE — Telephone Encounter (Signed)
Thomas [name] called this am. He is still having trouble with his stomach. He said that the last medication that was given to him didn't work that well, however, he felt that the Uceris did work for him.  He would like some more samples of that and a prescription of it if is available with a generic prescription. Thanks.

## 2012-04-28 ENCOUNTER — Telehealth: Payer: Self-pay | Admitting: Internal Medicine

## 2012-04-28 NOTE — Telephone Encounter (Signed)
Pt came to front window late this afternoon to pick up samples that he said that he had spoken to someone about. Uceris. There was nothing in sample drawer and nurse said she was waiting to hear back from RMR. I told patient that someone will call him regarding his and his wife's samples. [id number]

## 2012-04-29 NOTE — Telephone Encounter (Signed)
See separate phone note.

## 2012-04-29 NOTE — Telephone Encounter (Signed)
Eucerin may be substituted for Entocort for this particular patient only

## 2012-04-29 NOTE — Telephone Encounter (Signed)
uceris samples #12 packs at the front desk. Pts wife is aware, she said Dr. Gerarda Fraction called in rx for pt but it was 240.00 and they cant afford it right now.

## 2012-05-12 ENCOUNTER — Telehealth: Payer: Self-pay | Admitting: *Deleted

## 2012-05-12 DIAGNOSIS — R197 Diarrhea, unspecified: Secondary | ICD-10-CM

## 2012-05-12 NOTE — Telephone Encounter (Signed)
Ms Brosius called today for her husband. He is not feeling well and she would like an antibiotic called in for him. She said that usually clears him up. Please call her back. Thanks.

## 2012-05-13 ENCOUNTER — Other Ambulatory Visit: Payer: Self-pay

## 2012-05-13 DIAGNOSIS — R197 Diarrhea, unspecified: Secondary | ICD-10-CM

## 2012-05-13 NOTE — Telephone Encounter (Addendum)
pts wife aware, will try to come by today and get stool sample container and lab orders. Pt was scheduled an appt tomorrow with [facility]SL. He picked up samples late and only has 3 days of uceris left. No recent abx.

## 2012-05-13 NOTE — Addendum Note (Signed)
Addended by: Claudina Lick on: 05/13/2012 02:41 PM   Modules accepted: Orders

## 2012-05-13 NOTE — Telephone Encounter (Signed)
Spoke with pts wife- she stated pt started having diarrhea on Friday, he has continued to have diarrhea since Friday and his stomach is hurting and he feels weak, no vomiting, no fever. He is taking the uceris daily. She stated the last time he got like this RMR gave him an antibiotic and he got better. They want to know if we can call in an abx to [facility]. Please advise.

## 2012-05-13 NOTE — Telephone Encounter (Signed)
Per RMR-  He should have come off of Uceris completely by December 4 according to the October 4 office visit plan. Has he taken any antibiotics recently?Marland Kitchen He can use Imodium for diarrhea. Stat C. difficile PCR and office visit with extender ASAP ----- Message ----- From: Marylou Mccoy, LPN Sent: [id number] 10:12 AM To: Daneil Dolin, MD

## 2012-05-14 ENCOUNTER — Ambulatory Visit: Payer: Medicare Other | Admitting: Gastroenterology

## 2012-06-24 ENCOUNTER — Other Ambulatory Visit (HOSPITAL_COMMUNITY): Payer: Self-pay | Admitting: Internal Medicine

## 2012-06-24 DIAGNOSIS — N184 Chronic kidney disease, stage 4 (severe): Secondary | ICD-10-CM

## 2012-06-24 DIAGNOSIS — R509 Fever, unspecified: Secondary | ICD-10-CM

## 2012-06-24 DIAGNOSIS — I10 Essential (primary) hypertension: Secondary | ICD-10-CM

## 2012-06-26 ENCOUNTER — Ambulatory Visit (HOSPITAL_COMMUNITY): Payer: Medicare Other

## 2012-06-29 LAB — CBC
Protein, Ur: 300
WBC: 8.6
platelet count: 262

## 2012-06-30 LAB — COMPREHENSIVE METABOLIC PANEL
CO2: 24 mmol/L
Calcium: 8.1 mg/dL
Chloride: 106 mmol/L
Potassium: 4.1 mmol/L

## 2012-07-03 ENCOUNTER — Ambulatory Visit (HOSPITAL_COMMUNITY)
Admission: RE | Admit: 2012-07-03 | Discharge: 2012-07-03 | Disposition: A | Discharge status: Home / Self Care | Payer: Medicare Other | Source: Ambulatory Visit | Attending: Internal Medicine | Admitting: Internal Medicine

## 2012-07-03 ENCOUNTER — Other Ambulatory Visit (HOSPITAL_COMMUNITY): Payer: Self-pay | Admitting: Internal Medicine

## 2012-07-03 DIAGNOSIS — N184 Chronic kidney disease, stage 4 (severe): Secondary | ICD-10-CM

## 2012-07-03 DIAGNOSIS — R1032 Left lower quadrant pain: Secondary | ICD-10-CM | POA: Insufficient documentation

## 2012-07-03 DIAGNOSIS — R509 Fever, unspecified: Secondary | ICD-10-CM

## 2012-07-03 DIAGNOSIS — N179 Acute kidney failure, unspecified: Secondary | ICD-10-CM

## 2012-07-03 DIAGNOSIS — K5730 Diverticulosis of large intestine without perforation or abscess without bleeding: Secondary | ICD-10-CM | POA: Insufficient documentation

## 2012-07-03 DIAGNOSIS — Q619 Cystic kidney disease, unspecified: Secondary | ICD-10-CM | POA: Insufficient documentation

## 2012-07-03 DIAGNOSIS — R935 Abnormal findings on diagnostic imaging of other abdominal regions, including retroperitoneum: Secondary | ICD-10-CM | POA: Insufficient documentation

## 2012-07-03 DIAGNOSIS — K5909 Other constipation: Secondary | ICD-10-CM | POA: Insufficient documentation

## 2012-07-03 DIAGNOSIS — R1031 Right lower quadrant pain: Secondary | ICD-10-CM | POA: Insufficient documentation

## 2012-07-03 DIAGNOSIS — I10 Essential (primary) hypertension: Secondary | ICD-10-CM

## 2012-07-03 DIAGNOSIS — N281 Cyst of kidney, acquired: Secondary | ICD-10-CM

## 2012-07-07 ENCOUNTER — Encounter: Payer: Self-pay | Admitting: Urgent Care

## 2012-07-07 ENCOUNTER — Ambulatory Visit (INDEPENDENT_AMBULATORY_CARE_PROVIDER_SITE_OTHER): Payer: Medicare Other | Admitting: Urgent Care

## 2012-07-07 VITALS — BP 146/74 | HR 73 | Temp 97.4°F | Ht 66.0 in | Wt 176.2 lb

## 2012-07-07 DIAGNOSIS — R109 Unspecified abdominal pain: Secondary | ICD-10-CM

## 2012-07-07 DIAGNOSIS — R1010 Upper abdominal pain, unspecified: Secondary | ICD-10-CM

## 2012-07-07 DIAGNOSIS — R110 Nausea: Secondary | ICD-10-CM

## 2012-07-07 DIAGNOSIS — E119 Type 2 diabetes mellitus without complications: Secondary | ICD-10-CM

## 2012-07-07 DIAGNOSIS — D649 Anemia, unspecified: Secondary | ICD-10-CM

## 2012-07-07 MED ORDER — OMEPRAZOLE 20 MG PO CPDR: 20.0000 mg | DELAYED_RELEASE_CAPSULE | Freq: Every day | ORAL | Status: DC

## 2012-07-07 MED ORDER — ONDANSETRON HCL 4 MG PO TABS: 4.0000 mg | ORAL_TABLET | Freq: Four times a day (QID) | ORAL | Status: DC | PRN

## 2012-07-07 NOTE — Progress Notes (Signed)
Primary Care Physician:  Glo Herring., MD Primary Gastroenterologist:  Dr.   Laurel Dimmer Complaint  Patient presents with  . Follow-up  . Abdominal Pain  . Nausea    HPI:  Thomas [name] is a 72 y.o. male here for follow-up for microscopic colitis, however pt tells me he is "very sick".  He was hospitalized last week at [facility] for pneumonia & COPD exacerbation.  He reports upper abdominal pain started upon hospitalization & has not gotten any better.  He was seen in follow-up with Dr Gerarda Fraction & noncontrast CT A/P show a complex right renal cyst that he will follow up with Dr Gerarda Fraction & mild stranding around the head of the pancreas & duodenum.  He says he has had chills & fever but did not take his temp.  He has had nausea that is constant.  He has upper abdominal pain in epigastric area & LUQ.  He has had alternating BMs between constiaption & diarrhea.  He denies rectal bleeding or melena.  He denies ETOH use or illicit drug use.  He is s/p cholecystectomy some 25 years ago for cholelithiasis.  He has been on new antibiotics & a new blood pressure pill.  He reports a 10# wt loss documented here in the past 6 months. He reports 3 loose stools today.  Denies vomiting but "feels bad" with nausea that is worse in the morning.  The upper abdominal pain is 8/10 at worst, 0 now.  It feels like a balloon blown up inside, not always worse after eating, but some times it is.  Pain usually lasts 1 hr or so.  He takes PRN zantac.  Labs show anemia with a Hgb 10.5, Hct 32.1.  Last EGD by Dr Gala Romney May 2009-Schatzki's ring, s/p 29F dilation, & mild erosive esophagitis.  Last colonoscopy  Sept 2013 showed lymphocytic colitis & diverticulosis.  Labs from [facility] 1/14:  Creat 3.93, BUN 62, glucose 109, Ca 8.1, otherwise normal Met 7. Cardiac panel Neg x 3. CBC shows anemia as above, platelets, WBC normal INR 1 UA shows blood, glucose, protein Influenza A/B negative, Blood cx negative x2, U cx  negative ESR 38 RMSF IgG 2.56, IgM 0.88 Lyme AB negative  CXR-resolution right basilar opacity, continued left base atelectasis  Vitals - 1 value per visit 07/07/2012 03/07/2012  Weight (lb) 176.2 186   07/03/12 CT A/P without contrast due to renal disease.  No acute findings in the abdomen or pelvis.  2. Sigmoid diverticulosis without diverticulitis.  3. Complex cyst in the upper pole of the right kidney has increased in volume compared to prior. If the patient cannot receive IV contrast for a contrast enhanced renal MRI, recommend renal ultrasound for further characterization.  There is a mild stranding around the head the pancreas and second portion of the duodenum. Recommend clinical correlation with duodenitis or mild pancreatitis. Recent Results (from the past 2016 hour(s))  CLOSTRIDIUM DIFFICILE BY PCR   Collection Time   06/24/12  6:00 AM      Component Value Range   C difficile by pcr Not Detected  Not Detected    Past Medical History  Diagnosis Date  . HTN (hypertension)   . Diabetes mellitus   . Hypercholesterolemia   . Myocardial infarction     X2  . Cardiac arrest   . Lymphocytic colitis 2013    tcs by RMR  . Diverticulosis 2013    tcs by RMR  . Tobacco abuse  Past Surgical History  Procedure Date  . Cholecystectomy   . Cardiac stents     5  . Coronary angioplasty   . Esophagogastroduodenoscopy May 2009    RMR: non-critical Schatzki's ring, sp dilation with 34 F Maloney, small inlet patch, mild erosive relufx esophagitis  . Colonoscopy December 2007    adenomatous polyps, sigmoid diverticula  . Colonoscopy 02/06/2012    Dr. Gala Romney- colonic diverticulosis, bx consistent with lymphocytic colitis    Current Outpatient Prescriptions  Medication Sig Dispense Refill  . cholecalciferol (VITAMIN D) 1000 UNITS tablet Take 1,000 Units by mouth daily.      . cloNIDine (CATAPRES) 0.2 MG tablet Take 0.2 mg by mouth 2 (two) times daily.       Marland Kitchen FIBER PO Take 1 capsule by  mouth daily.      . fish oil-omega-3 fatty acids 1000 MG capsule Take 1 g by mouth 4 (four) times daily.      . furosemide (LASIX) 40 MG tablet Take 40 mg by mouth daily.       . hydrALAZINE (APRESOLINE) 50 MG tablet Take 50 mg by mouth 2 (two) times daily.       . isosorbide mononitrate (IMDUR) 30 MG 24 hr tablet Take 30 mg by mouth daily.       Marland Kitchen JANUVIA 50 MG tablet Take 50 mg by mouth daily.       Marland Kitchen LANTUS SOLOSTAR 100 UNIT/ML injection Inject 20 Units into the skin at bedtime as needed. According to sugar level.      Marland Kitchen losartan-hydrochlorothiazide (HYZAAR) 100-12.5 MG per tablet Take 1 tablet by mouth daily.       . metoprolol (LOPRESSOR) 100 MG tablet Take 100 mg by mouth 2 (two) times daily.       Marland Kitchen oxyCODONE-acetaminophen (PERCOCET/ROXICET) 5-325 MG per tablet Take 1 tablet by mouth every 6 (six) hours as needed.       . Potassium 99 MG TABS Take 2 tablets by mouth daily.      . pravastatin (PRAVACHOL) 40 MG tablet Take 40 mg by mouth daily.       . vitamin C (ASCORBIC ACID) 500 MG tablet Take 500 mg by mouth 2 (two) times daily.      Marland Kitchen omeprazole (PRILOSEC) 20 MG capsule Take 1 capsule (20 mg total) by mouth daily.  30 capsule  2  . ondansetron (ZOFRAN) 4 MG tablet Take 1 tablet (4 mg total) by mouth every 6 (six) hours as needed for nausea.  30 tablet  0    Allergies as of 07/07/2012 - Review Complete 07/07/2012  Allergen Reaction Noted  . Codeine Other (See Comments)     Family History:There is no known family history of colorectal carcinoma , liver disease, or inflammatory bowel disease.  Problem Relation Age of Onset  . Colon cancer Neg Hx     History   Social History  . Marital Status: Married    Spouse Name: N/A    Number of Children: 3  . Years of Education: N/A   Occupational History  . 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 Smoking status: Current Every Day Smoker -- 1.0 packs/day    Types: Cigarettes  . Smokeless tobacco: Not on file   . Alcohol Use: No  . Drug Use: No  . Sexually Active: Not on file   Review of Systems: Gen: See HPI CV: Denies chest pain, angina, palpitations, syncope, orthopnea, PND, peripheral edema, and claudication.  Resp: Some nonproductive cough, SOBOE GI: Denies vomiting blood, jaundice, and fecal incontinence.   GU : Denies urinary burning, blood in urine, urinary frequency, urinary hesitancy, nocturnal urination, and urinary incontinence. MS: Denies joint pain, limitation of movement, and swelling, stiffness, low back pain, extremity pain. Denies muscle weakness, cramps, atrophy.  Derm: Denies rash, itching, dry skin, hives, moles, warts, or unhealing ulcers.  Psych: Denies depression, anxiety, memory loss, suicidal ideation, hallucinations, paranoia, and confusion. Heme: Denies bruising, bleeding, and enlarged lymph nodes. Neuro:  Denies any headaches, dizziness, paresthesias. Endo:  See HPI  Physical Exam: BP 146/74  Pulse 73  Temp 97.4 F (36.3 C) (Oral)  Ht 5\' 6"  (1.676 m)  Wt 176 lb 3.2 oz (79.924 kg)  BMI 28.44 kg/m2 No LMP for male patient. General:   Alert,  Well-developed, well-nourished, pleasant and cooperative in NAD Head:  Normocephalic and atraumatic. Eyes:  Sclera clear, no icterus.   Conjunctiva pink. Ears:  Normal auditory acuity. Nose:  No deformity, discharge, or lesions. Mouth:  No deformity or lesions,oropharynx pink & moist. Neck:  Supple; no masses or thyromegaly. Lungs:  Mild insp/exp wheezes bilat.  NAD> Heart:  Regular rate and rhythm; no murmurs, clicks, rubs,  or gallops. Abdomen:  Normal bowel sounds.  No bruits.  Soft, non-distended without masses, hepatosplenomegaly or hernias noted.  +Mild TTP upper abdomen & LUQ.  No guarding or rebound tenderness.   Rectal:  Deferred. Msk:  Symmetrical without gross deformities. Normal posture. Pulses:  Normal pulses noted. Extremities:  + clubbing Neurologic:  Alert and  oriented x4;  grossly normal  neurologically. Skin:  Intact without significant lesions or rashes. Lymph Nodes:  No significant cervical adenopathy. Psych:  Alert and cooperative. Normal mood and affect.

## 2012-07-07 NOTE — Patient Instructions (Addendum)
We have requested your records from [facility] Begin omeprazole 20mg  daily  Use zofran 4mg  every 6 hrs as needed for nausea We will call with lab results

## 2012-07-08 LAB — CBC WITH DIFFERENTIAL/PLATELET
Basophils Relative: 0 % (ref 0–1)
Eosinophils Absolute: 0.2 10*3/uL (ref 0.0–0.7)
Eosinophils Relative: 2 % (ref 0–5)
HCT: 35.5 % — ABNORMAL LOW (ref 39.0–52.0)
Hemoglobin: 12 g/dL — ABNORMAL LOW (ref 13.0–17.0)
MCH: 26.7 pg (ref 26.0–34.0)
MCHC: 33.8 g/dL (ref 30.0–36.0)
MCV: 79.1 fL (ref 78.0–100.0)
Monocytes Absolute: 0.5 10*3/uL (ref 0.1–1.0)
Monocytes Relative: 5 % (ref 3–12)

## 2012-07-08 LAB — HEPATIC FUNCTION PANEL
ALT: 11 U/L (ref 0–53)
AST: 16 U/L (ref 0–37)
Albumin: 3.7 g/dL (ref 3.5–5.2)
Alkaline Phosphatase: 106 U/L (ref 39–117)

## 2012-07-08 LAB — IRON AND TIBC
%SAT: 16 % — ABNORMAL LOW (ref 20–55)
Iron: 37 ug/dL — ABNORMAL LOW (ref 42–165)
UIBC: 201 ug/dL (ref 125–400)

## 2012-07-08 LAB — FOLATE: Folate: >20 ng/mL

## 2012-07-08 LAB — MAGNESIUM: Magnesium: 1.9 mg/dL (ref 1.5–2.5)

## 2012-07-08 LAB — FERRITIN: Ferritin: 146 ng/mL (ref 22–322)

## 2012-07-09 ENCOUNTER — Encounter: Payer: Self-pay | Admitting: Urgent Care

## 2012-07-09 ENCOUNTER — Other Ambulatory Visit: Payer: Self-pay | Admitting: Internal Medicine

## 2012-07-09 DIAGNOSIS — K319 Disease of stomach and duodenum, unspecified: Secondary | ICD-10-CM

## 2012-07-09 DIAGNOSIS — R112 Nausea with vomiting, unspecified: Secondary | ICD-10-CM

## 2012-07-09 DIAGNOSIS — R1010 Upper abdominal pain, unspecified: Secondary | ICD-10-CM

## 2012-07-09 NOTE — Progress Notes (Signed)
Quick Note:  Results given to pt. Feeling much better. Needs EGD w/ Dr Gala Romney [id number] abd pain, N/V, abnormal duodenum on CT I have discussed risks & benefits which include, but are not limited to, bleeding, infection, perforation & drug reaction. The patient agrees with this plan & written consent will be obtained.  Darius Bump, please arrange Take half of your Lantus (10 UNITS) the night before your procedure Take half januvia day prior to procedure. Hold day of procedure. Needs Early morning appointment Bring all your medications and/or any insulin to the hospital the day of the procedure. Follow blood sugars, call us or your PCP if any problems.  CC: Glo Herring., MD  ______

## 2012-07-09 NOTE — Assessment & Plan Note (Signed)
Likely anemia of chronic disease.  Creatinine 3.93, hematuria, complex renal cystic lesion on CT to be followed by PCP to r/o malignancy.  No gross GI bleeding.  Anemia panel TSH

## 2012-07-09 NOTE — Assessment & Plan Note (Addendum)
Thomas [name] is a pleasant 72 y.o. male  With 2 weeks of upper abdominal pain, anemia, nausea & vomiting that started after admission to [facility] for Pneumonia & COPD exacerbation.  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  No gross GI bleeding.  He has not been on PPI.  Non-contrast CT suggests stranding around head of the pancreas & D2.  He also has a complex right renal lesion that will need to be characterized further.  He is following up with Dr Gerarda Fraction for this.  Differentials include pancreatitis, peptic or duodenal ulcer, gastritis or erosive esophagitis.  Labs today: CBC, LFTs, lipase Begin omeprazole 20mg  daily Use zofran 4mg  every 6 hrs as needed for nausea EGD with Dr Gala Romney in the near future pending review of labs.

## 2012-07-09 NOTE — Assessment & Plan Note (Addendum)
See upper abdominal pain.  Differentials include PUD, gastritis, pancreatitis, renal disease or gastroparesis.  Check Mag.

## 2012-07-09 NOTE — Progress Notes (Signed)
Labs faxed to Dr Gerarda Fraction

## 2012-07-09 NOTE — Progress Notes (Signed)
Faxed to PCP

## 2012-07-09 NOTE — Progress Notes (Signed)
Patient is scheduled for EGD w/RMR on Thurs Feb 13 at 1:00 and I have mailed him the instructions and he is aware

## 2012-07-10 ENCOUNTER — Ambulatory Visit (HOSPITAL_COMMUNITY)
Admission: RE | Admit: 2012-07-10 | Discharge: 2012-07-10 | Disposition: A | Discharge status: Home / Self Care | Payer: Medicare Other | Source: Ambulatory Visit | Attending: Internal Medicine | Admitting: Internal Medicine

## 2012-07-10 DIAGNOSIS — Q619 Cystic kidney disease, unspecified: Secondary | ICD-10-CM | POA: Insufficient documentation

## 2012-07-10 DIAGNOSIS — N281 Cyst of kidney, acquired: Secondary | ICD-10-CM

## 2012-07-10 DIAGNOSIS — N179 Acute kidney failure, unspecified: Secondary | ICD-10-CM | POA: Insufficient documentation

## 2012-07-11 ENCOUNTER — Encounter (HOSPITAL_COMMUNITY): Payer: Self-pay | Admitting: Pharmacy Technician

## 2012-07-15 ENCOUNTER — Encounter (HOSPITAL_COMMUNITY): Payer: Self-pay | Admitting: *Deleted

## 2012-07-15 ENCOUNTER — Encounter (HOSPITAL_COMMUNITY)
Admission: RE | Disposition: A | Discharge status: Home / Self Care | Payer: Self-pay | Source: Ambulatory Visit | Attending: Internal Medicine

## 2012-07-15 ENCOUNTER — Ambulatory Visit (HOSPITAL_COMMUNITY)
Admission: RE | Admit: 2012-07-15 | Discharge: 2012-07-15 | Disposition: A | Discharge status: Home / Self Care | Payer: Medicare Other | Source: Ambulatory Visit | Attending: Internal Medicine | Admitting: Internal Medicine

## 2012-07-15 DIAGNOSIS — E119 Type 2 diabetes mellitus without complications: Secondary | ICD-10-CM | POA: Insufficient documentation

## 2012-07-15 DIAGNOSIS — Z01812 Encounter for preprocedural laboratory examination: Secondary | ICD-10-CM | POA: Insufficient documentation

## 2012-07-15 DIAGNOSIS — R1010 Upper abdominal pain, unspecified: Secondary | ICD-10-CM

## 2012-07-15 DIAGNOSIS — K2940 Chronic atrophic gastritis without bleeding: Secondary | ICD-10-CM | POA: Insufficient documentation

## 2012-07-15 DIAGNOSIS — R109 Unspecified abdominal pain: Secondary | ICD-10-CM

## 2012-07-15 DIAGNOSIS — K269 Duodenal ulcer, unspecified as acute or chronic, without hemorrhage or perforation: Secondary | ICD-10-CM

## 2012-07-15 DIAGNOSIS — K319 Disease of stomach and duodenum, unspecified: Secondary | ICD-10-CM

## 2012-07-15 DIAGNOSIS — K2980 Duodenitis without bleeding: Secondary | ICD-10-CM | POA: Insufficient documentation

## 2012-07-15 DIAGNOSIS — I10 Essential (primary) hypertension: Secondary | ICD-10-CM | POA: Insufficient documentation

## 2012-07-15 DIAGNOSIS — R933 Abnormal findings on diagnostic imaging of other parts of digestive tract: Secondary | ICD-10-CM

## 2012-07-15 DIAGNOSIS — K449 Diaphragmatic hernia without obstruction or gangrene: Secondary | ICD-10-CM | POA: Insufficient documentation

## 2012-07-15 DIAGNOSIS — R112 Nausea with vomiting, unspecified: Secondary | ICD-10-CM

## 2012-07-15 HISTORY — PX: ESOPHAGOGASTRODUODENOSCOPY: SHX5428

## 2012-07-15 SURGERY — EGD (ESOPHAGOGASTRODUODENOSCOPY)
Anesthesia: Moderate Sedation

## 2012-07-15 MED ORDER — ONDANSETRON HCL 4 MG/2ML IJ SOLN
INTRAMUSCULAR | Status: AC
  Filled 2012-07-15: qty 2

## 2012-07-15 MED ORDER — STERILE WATER FOR IRRIGATION IR SOLN
Status: DC | PRN
  Administered 2012-07-15: 15:00:00

## 2012-07-15 MED ORDER — SODIUM CHLORIDE 0.45 % IV SOLN
INTRAVENOUS | Status: DC
  Administered 2012-07-15: 13:00:00 via INTRAVENOUS

## 2012-07-15 MED ORDER — ONDANSETRON HCL 4 MG/2ML IJ SOLN
INTRAMUSCULAR | Status: DC | PRN
  Administered 2012-07-15: 4 mg via INTRAVENOUS

## 2012-07-15 MED ORDER — BUTAMBEN-TETRACAINE-BENZOCAINE 2-2-14 % EX AERO
INHALATION_SPRAY | CUTANEOUS | Status: DC | PRN
  Administered 2012-07-15: 2 via TOPICAL

## 2012-07-15 MED ORDER — MIDAZOLAM HCL 5 MG/5ML IJ SOLN
INTRAMUSCULAR | Status: AC
  Filled 2012-07-15: qty 10

## 2012-07-15 MED ORDER — MEPERIDINE HCL 100 MG/ML IJ SOLN
INTRAMUSCULAR | Status: AC
  Filled 2012-07-15: qty 2

## 2012-07-15 MED ORDER — MIDAZOLAM HCL 5 MG/5ML IJ SOLN
INTRAMUSCULAR | Status: DC | PRN
  Administered 2012-07-15: 2 mg via INTRAVENOUS

## 2012-07-15 MED ORDER — MEPERIDINE HCL 100 MG/ML IJ SOLN
INTRAMUSCULAR | Status: DC | PRN
  Administered 2012-07-15: 50 mg via INTRAVENOUS

## 2012-07-15 NOTE — Interval H&P Note (Signed)
History and Physical Interval Note:  07/15/2012 2:42 PM  Thomas [name]  has presented today for surgery, with the diagnosis of :upper abd pain, N/V, abnormal duodenum on CT  The various methods of treatment have been discussed with the patient and family. After consideration of risks, benefits and other options for treatment, the patient has consented to  Procedure(s) with comments: ESOPHAGOGASTRODUODENOSCOPY (EGD) (N/A) - 1:00 as a surgical intervention .  The patient's history has been reviewed, patient examined, no change in status, stable for surgery.  I have reviewed the patient's chart and labs.  Questions were answered to the patient's satisfaction.     Keerstin Bjelland  Abdominal pain resolved. EGD per plan.The risks, benefits, limitations, alternatives and imponderables have been reviewed with the patient. Potential for esophageal dilation, biopsy, etc. have also been reviewed.  Questions have been answered. All parties agreeable.

## 2012-07-15 NOTE — H&P (View-Only) (Signed)
Primary Care Physician:  Glo Herring., MD Primary Gastroenterologist:  Dr.   Laurel Dimmer Complaint  Patient presents with  . Follow-up  . Abdominal Pain  . Nausea    HPI:  Thomas [name] is a 72 y.o. male here for follow-up for microscopic colitis, however pt tells me he is "very sick".  He was hospitalized last week at [facility] for pneumonia & COPD exacerbation.  He reports upper abdominal pain started upon hospitalization & has not gotten any better.  He was seen in follow-up with Dr Gerarda Fraction & noncontrast CT A/P show a complex right renal cyst that he will follow up with Dr Gerarda Fraction & mild stranding around the head of the pancreas & duodenum.  He says he has had chills & fever but did not take his temp.  He has had nausea that is constant.  He has upper abdominal pain in epigastric area & LUQ.  He has had alternating BMs between constiaption & diarrhea.  He denies rectal bleeding or melena.  He denies ETOH use or illicit drug use.  He is s/p cholecystectomy some 25 years ago for cholelithiasis.  He has been on new antibiotics & a new blood pressure pill.  He reports a 10# wt loss documented here in the past 6 months. He reports 3 loose stools today.  Denies vomiting but "feels bad" with nausea that is worse in the morning.  The upper abdominal pain is 8/10 at worst, 0 now.  It feels like a balloon blown up inside, not always worse after eating, but some times it is.  Pain usually lasts 1 hr or so.  He takes PRN zantac.  Labs show anemia with a Hgb 10.5, Hct 32.1.  Last EGD by Dr Gala Romney May 2009-Schatzki's ring, s/p 22F dilation, & mild erosive esophagitis.  Last colonoscopy  Sept 2013 showed lymphocytic colitis & diverticulosis.  Labs from [facility] 1/14:  Creat 3.93, BUN 62, glucose 109, Ca 8.1, otherwise normal Met 7. Cardiac panel Neg x 3. CBC shows anemia as above, platelets, WBC normal INR 1 UA shows blood, glucose, protein Influenza A/B negative, Blood cx negative x2, U cx  negative ESR 38 RMSF IgG 2.56, IgM 0.88 Lyme AB negative  CXR-resolution right basilar opacity, continued left base atelectasis  Vitals - 1 value per visit 07/07/2012 03/07/2012  Weight (lb) 176.2 186   07/03/12 CT A/P without contrast due to renal disease.  No acute findings in the abdomen or pelvis.  2. Sigmoid diverticulosis without diverticulitis.  3. Complex cyst in the upper pole of the right kidney has increased in volume compared to prior. If the patient cannot receive IV contrast for a contrast enhanced renal MRI, recommend renal ultrasound for further characterization.  There is a mild stranding around the head the pancreas and second portion of the duodenum. Recommend clinical correlation with duodenitis or mild pancreatitis. Recent Results (from the past 2016 hour(s))  CLOSTRIDIUM DIFFICILE BY PCR   Collection Time   06/24/12  6:00 AM      Component Value Range   C difficile by pcr Not Detected  Not Detected    Past Medical History  Diagnosis Date  . HTN (hypertension)   . Diabetes mellitus   . Hypercholesterolemia   . Myocardial infarction     X2  . Cardiac arrest   . Lymphocytic colitis 2013    tcs by RMR  . Diverticulosis 2013    tcs by RMR  . Tobacco abuse  Past Surgical History  Procedure Date  . Cholecystectomy   . Cardiac stents     5  . Coronary angioplasty   . Esophagogastroduodenoscopy May 2009    RMR: non-critical Schatzki's ring, sp dilation with 63 F Maloney, small inlet patch, mild erosive relufx esophagitis  . Colonoscopy December 2007    adenomatous polyps, sigmoid diverticula  . Colonoscopy 02/06/2012    Dr. Gala Romney- colonic diverticulosis, bx consistent with lymphocytic colitis    Current Outpatient Prescriptions  Medication Sig Dispense Refill  . cholecalciferol (VITAMIN D) 1000 UNITS tablet Take 1,000 Units by mouth daily.      . cloNIDine (CATAPRES) 0.2 MG tablet Take 0.2 mg by mouth 2 (two) times daily.       Marland Kitchen FIBER PO Take 1 capsule by  mouth daily.      . fish oil-omega-3 fatty acids 1000 MG capsule Take 1 g by mouth 4 (four) times daily.      . furosemide (LASIX) 40 MG tablet Take 40 mg by mouth daily.       . hydrALAZINE (APRESOLINE) 50 MG tablet Take 50 mg by mouth 2 (two) times daily.       . isosorbide mononitrate (IMDUR) 30 MG 24 hr tablet Take 30 mg by mouth daily.       Marland Kitchen JANUVIA 50 MG tablet Take 50 mg by mouth daily.       Marland Kitchen LANTUS SOLOSTAR 100 UNIT/ML injection Inject 20 Units into the skin at bedtime as needed. According to sugar level.      Marland Kitchen losartan-hydrochlorothiazide (HYZAAR) 100-12.5 MG per tablet Take 1 tablet by mouth daily.       . metoprolol (LOPRESSOR) 100 MG tablet Take 100 mg by mouth 2 (two) times daily.       Marland Kitchen oxyCODONE-acetaminophen (PERCOCET/ROXICET) 5-325 MG per tablet Take 1 tablet by mouth every 6 (six) hours as needed.       . Potassium 99 MG TABS Take 2 tablets by mouth daily.      . pravastatin (PRAVACHOL) 40 MG tablet Take 40 mg by mouth daily.       . vitamin C (ASCORBIC ACID) 500 MG tablet Take 500 mg by mouth 2 (two) times daily.      Marland Kitchen omeprazole (PRILOSEC) 20 MG capsule Take 1 capsule (20 mg total) by mouth daily.  30 capsule  2  . ondansetron (ZOFRAN) 4 MG tablet Take 1 tablet (4 mg total) by mouth every 6 (six) hours as needed for nausea.  30 tablet  0    Allergies as of 07/07/2012 - Review Complete 07/07/2012  Allergen Reaction Noted  . Codeine Other (See Comments)     Family History:There is no known family history of colorectal carcinoma , liver disease, or inflammatory bowel disease.  Problem Relation Age of Onset  . Colon cancer Neg Hx     History   Social History  . Marital Status: Married    Spouse Name: N/A    Number of Children: 3  . Years of Education: N/A   Occupational History  . 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 Smoking status: Current Every Day Smoker -- 1.0 packs/day    Types: Cigarettes  . Smokeless tobacco: Not on file   . Alcohol Use: No  . Drug Use: No  . Sexually Active: Not on file   Review of Systems: Gen: See HPI CV: Denies chest pain, angina, palpitations, syncope, orthopnea, PND, peripheral edema, and claudication.  Resp: Some nonproductive cough, SOBOE GI: Denies vomiting blood, jaundice, and fecal incontinence.   GU : Denies urinary burning, blood in urine, urinary frequency, urinary hesitancy, nocturnal urination, and urinary incontinence. MS: Denies joint pain, limitation of movement, and swelling, stiffness, low back pain, extremity pain. Denies muscle weakness, cramps, atrophy.  Derm: Denies rash, itching, dry skin, hives, moles, warts, or unhealing ulcers.  Psych: Denies depression, anxiety, memory loss, suicidal ideation, hallucinations, paranoia, and confusion. Heme: Denies bruising, bleeding, and enlarged lymph nodes. Neuro:  Denies any headaches, dizziness, paresthesias. Endo:  See HPI  Physical Exam: BP 146/74  Pulse 73  Temp 97.4 F (36.3 C) (Oral)  Ht 5\' 6"  (1.676 m)  Wt 176 lb 3.2 oz (79.924 kg)  BMI 28.44 kg/m2 No LMP for male patient. General:   Alert,  Well-developed, well-nourished, pleasant and cooperative in NAD Head:  Normocephalic and atraumatic. Eyes:  Sclera clear, no icterus.   Conjunctiva pink. Ears:  Normal auditory acuity. Nose:  No deformity, discharge, or lesions. Mouth:  No deformity or lesions,oropharynx pink & moist. Neck:  Supple; no masses or thyromegaly. Lungs:  Mild insp/exp wheezes bilat.  NAD> Heart:  Regular rate and rhythm; no murmurs, clicks, rubs,  or gallops. Abdomen:  Normal bowel sounds.  No bruits.  Soft, non-distended without masses, hepatosplenomegaly or hernias noted.  +Mild TTP upper abdomen & LUQ.  No guarding or rebound tenderness.   Rectal:  Deferred. Msk:  Symmetrical without gross deformities. Normal posture. Pulses:  Normal pulses noted. Extremities:  + clubbing Neurologic:  Alert and  oriented x4;  grossly normal  neurologically. Skin:  Intact without significant lesions or rashes. Lymph Nodes:  No significant cervical adenopathy. Psych:  Alert and cooperative. Normal mood and affect.

## 2012-07-15 NOTE — Op Note (Signed)
[facility] [address] [place], [address]   ENDOSCOPY PROCEDURE REPORT  PATIENT: Thomas [name], Thomas [name]  Mr  MR#: [id number] BIRTHDATE: [birth year] , 71  yrs. old GENDER: Male ENDOSCOPIST: R.  Garfield Cornea, MD FACP FACG REFERRED BY:  Kerin Perna, M.D. PROCEDURE DATE:  07/15/2012 PROCEDURE:     EGD with gastric and duodenal biopsy  INDICATIONS:     Recent upper abdominal pain-resolved; history of subtle abnormality in the area of the duodenum and head of the pancreas on recent non-IV contrast CT imaging  INFORMED CONSENT:   The risks, benefits, limitations, alternatives and imponderables have been discussed.  The potential for biopsy, esophogeal dilation, etc. have also been reviewed.  Questions have been answered.  All parties agreeable.  Please see the history and physical in the medical record for more information.  MEDICATIONS:    Versed 2 mg IV and Demerol 50 mg IV. Zofran 4 mg IV Cetacaine spray  DESCRIPTION OF PROCEDURE:   The Pentax Gastroscope [id number] endoscope was introduced through the mouth and advanced to the second portion of the duodenum without difficulty or limitations. The mucosal surfaces were surveyed very carefully during advancement of the scope and upon withdrawal.  Retroflexion view of the proximal stomach and esophagogastric junction was performed.      FINDINGS: Normal esophagus. Stomach empty. Small hiatal hernia. Pylorus patent. Examination of bulb second and third portion revealed a diffusely eroded and friable mucosa. There was an 78mm friable ulcer crater straddling the junction of the first and second portion of the duodenum. Please see photos.  THERAPEUTIC / DIAGNOSTIC MANEUVERS PERFORMED:  The abnormal duodenal mucosa was biopsied. Also, biopsies of the gastric because were taken to assess for Helicobacter pylori.   COMPLICATIONS:  None  IMPRESSION:  Hiatal hernia. Duodenal ulcer with surrounding erosions. This may well  be related to the "haziness" seen around the head of the pancreas and duodenum on recent noncontrast CT scan and may have much to do with his abdominal pain.  RECOMMENDATIONS:  Continue omeprazole 20 mg daily. Avoid all nonsteroidals. Follow up on biopsies.    _______________________________ R. Garfield Cornea, MD FACP [facility] eSigned:  R. Garfield Cornea, MD FACP [facility] 07/15/2012 3:09 PM     CC:

## 2012-07-20 ENCOUNTER — Encounter: Payer: Self-pay | Admitting: Internal Medicine

## 2012-07-21 ENCOUNTER — Encounter (HOSPITAL_COMMUNITY): Payer: Self-pay | Admitting: Internal Medicine

## 2012-07-22 ENCOUNTER — Encounter: Payer: Self-pay | Admitting: *Deleted

## 2012-07-22 ENCOUNTER — Other Ambulatory Visit: Payer: Self-pay | Admitting: Internal Medicine

## 2012-07-22 ENCOUNTER — Other Ambulatory Visit: Payer: Self-pay

## 2012-07-22 ENCOUNTER — Telehealth: Payer: Self-pay

## 2012-07-22 DIAGNOSIS — R1010 Upper abdominal pain, unspecified: Secondary | ICD-10-CM

## 2012-07-22 DIAGNOSIS — R110 Nausea: Secondary | ICD-10-CM

## 2012-07-22 NOTE — Telephone Encounter (Signed)
Path and letter faxed to PCP

## 2012-07-22 NOTE — Telephone Encounter (Signed)
Per RMR- pt needs MRI- pancreas and kidneys and hp serologies. Called and informed pt and gave him bx results from RMR letter. Lab order faxed to lab at [facility].   Leighann please schedule MRI.  Dawn, please mail letter to pt and cc pcp.

## 2012-07-23 ENCOUNTER — Other Ambulatory Visit: Payer: Self-pay | Admitting: Internal Medicine

## 2012-07-23 ENCOUNTER — Other Ambulatory Visit: Payer: Self-pay | Admitting: Gastroenterology

## 2012-07-23 DIAGNOSIS — R932 Abnormal findings on diagnostic imaging of liver and biliary tract: Secondary | ICD-10-CM

## 2012-07-23 DIAGNOSIS — R935 Abnormal findings on diagnostic imaging of other abdominal regions, including retroperitoneum: Secondary | ICD-10-CM

## 2012-07-23 NOTE — Telephone Encounter (Signed)
Patient is scheduled for MRI abd/pel on Wednesday Feb 26th at 8:00 am and patient is aware to go by [facility] and have creatinine drawn before MRI

## 2012-07-30 ENCOUNTER — Other Ambulatory Visit: Payer: Self-pay | Admitting: Internal Medicine

## 2012-07-30 ENCOUNTER — Ambulatory Visit (HOSPITAL_COMMUNITY)
Admission: RE | Admit: 2012-07-30 | Discharge: 2012-07-30 | Disposition: A | Discharge status: Home / Self Care | Payer: Medicare Other | Source: Ambulatory Visit | Attending: Internal Medicine | Admitting: Internal Medicine

## 2012-07-30 ENCOUNTER — Other Ambulatory Visit (HOSPITAL_COMMUNITY): Payer: Medicare Other

## 2012-07-30 ENCOUNTER — Ambulatory Visit (HOSPITAL_COMMUNITY): Payer: Medicare Other

## 2012-07-30 DIAGNOSIS — R932 Abnormal findings on diagnostic imaging of liver and biliary tract: Secondary | ICD-10-CM

## 2012-07-30 DIAGNOSIS — K859 Acute pancreatitis without necrosis or infection, unspecified: Secondary | ICD-10-CM

## 2012-07-30 DIAGNOSIS — R933 Abnormal findings on diagnostic imaging of other parts of digestive tract: Secondary | ICD-10-CM | POA: Insufficient documentation

## 2012-07-30 DIAGNOSIS — R110 Nausea: Secondary | ICD-10-CM | POA: Insufficient documentation

## 2012-07-30 DIAGNOSIS — R197 Diarrhea, unspecified: Secondary | ICD-10-CM | POA: Insufficient documentation

## 2012-08-01 ENCOUNTER — Ambulatory Visit (INDEPENDENT_AMBULATORY_CARE_PROVIDER_SITE_OTHER): Payer: Medicare Other | Admitting: Urology

## 2012-08-01 DIAGNOSIS — N281 Cyst of kidney, acquired: Secondary | ICD-10-CM

## 2012-08-26 ENCOUNTER — Encounter: Payer: Self-pay | Admitting: Internal Medicine

## 2012-09-03 ENCOUNTER — Other Ambulatory Visit: Payer: Self-pay | Admitting: Internal Medicine

## 2012-09-05 LAB — CLOSTRIDIUM DIFFICILE BY PCR: Toxigenic C. Difficile by PCR: NOT DETECTED

## 2012-09-30 ENCOUNTER — Ambulatory Visit: Payer: Medicare Other | Admitting: Internal Medicine

## 2012-10-07 ENCOUNTER — Other Ambulatory Visit (HOSPITAL_COMMUNITY): Payer: Self-pay | Admitting: Internal Medicine

## 2012-10-07 ENCOUNTER — Ambulatory Visit (HOSPITAL_COMMUNITY)
Admission: RE | Admit: 2012-10-07 | Discharge: 2012-10-07 | Disposition: A | Discharge status: Home / Self Care | Payer: Medicare Other | Source: Ambulatory Visit | Attending: Internal Medicine | Admitting: Internal Medicine

## 2012-10-07 DIAGNOSIS — R911 Solitary pulmonary nodule: Secondary | ICD-10-CM | POA: Insufficient documentation

## 2012-10-07 DIAGNOSIS — R05 Cough: Secondary | ICD-10-CM | POA: Insufficient documentation

## 2012-10-07 DIAGNOSIS — R0602 Shortness of breath: Secondary | ICD-10-CM | POA: Insufficient documentation

## 2012-10-07 DIAGNOSIS — R059 Cough, unspecified: Secondary | ICD-10-CM

## 2012-11-02 DIAGNOSIS — R112 Nausea with vomiting, unspecified: Secondary | ICD-10-CM

## 2012-11-27 ENCOUNTER — Ambulatory Visit (INDEPENDENT_AMBULATORY_CARE_PROVIDER_SITE_OTHER): Payer: Medicare Other | Admitting: Gastroenterology

## 2012-11-27 ENCOUNTER — Encounter: Payer: Self-pay | Admitting: Gastroenterology

## 2012-11-27 VITALS — BP 163/93 | HR 71 | Temp 98.3°F | Ht 71.0 in | Wt 169.8 lb

## 2012-11-27 DIAGNOSIS — R110 Nausea: Secondary | ICD-10-CM

## 2012-11-27 DIAGNOSIS — K625 Hemorrhage of anus and rectum: Secondary | ICD-10-CM

## 2012-11-27 DIAGNOSIS — R197 Diarrhea, unspecified: Secondary | ICD-10-CM

## 2012-11-27 MED ORDER — HYDROCORTISONE 2.5 % RE CREA: TOPICAL_CREAM | Freq: Two times a day (BID) | RECTAL | Status: DC

## 2012-11-27 MED ORDER — ONDANSETRON HCL 4 MG PO TABS: 4.0000 mg | ORAL_TABLET | Freq: Four times a day (QID) | ORAL | Status: DC | PRN

## 2012-11-27 NOTE — Patient Instructions (Addendum)
1. Please collect stools and take to the lab as soon as possible. 2. Anusol cream for hemorrhoids, apply twice a day. Prescription sent to pharmacy. 3. Prescription for Zofran since your pharmacy for nausea.  Diet for Diarrhea, Adult Frequent, runny stools (diarrhea) may be caused or worsened by food or drink. Diarrhea may be relieved by changing your diet. Since diarrhea can last up to 7 days, it is easy for you to lose too much fluid from the body and become dehydrated. Fluids that are lost need to be replaced. Along with a modified diet, make sure you drink enough fluids to keep your urine clear or pale yellow. DIET INSTRUCTIONS  Ensure adequate fluid intake (hydration): have 1 cup (8 oz) of fluid for each diarrhea episode. Avoid fluids that contain simple sugars or sports drinks, fruit juices, whole milk products, and sodas. Your urine should be clear or pale yellow if you are drinking enough fluids. Hydrate with an oral rehydration solution that you can purchase at pharmacies, retail stores, and online. You can prepare an oral rehydration solution at home by mixing the following ingredients together:    tsp table salt.   tsp baking soda.   tsp salt substitute containing potassium chloride.  1  tablespoons sugar.  1 L (34 oz) of water.  Certain foods and beverages may increase the speed at which food moves through the gastrointestinal (GI) tract. These foods and beverages should be avoided and include:  Caffeinated and alcoholic beverages.  High-fiber foods, such as raw fruits and vegetables, nuts, seeds, and whole grain breads and cereals.  Foods and beverages sweetened with sugar alcohols, such as xylitol, sorbitol, and mannitol.  Some foods may be well tolerated and may help thicken stool including:  Starchy foods, such as rice, toast, pasta, low-sugar cereal, oatmeal, grits, baked potatoes, crackers, and bagels.   Bananas.   Applesauce.  Add probiotic-rich foods to help  increase healthy bacteria in the GI tract, such as yogurt and fermented milk products. RECOMMENDED FOODS AND BEVERAGES Starches Choose foods with less than 2 g of fiber per serving.  Recommended:  White, [place], and pita breads, plain rolls, buns, bagels. Plain muffins, matzo. Soda, saltine, or graham crackers. Pretzels, melba toast, zwieback. Cooked cereals made with water: cornmeal, farina, cream cereals. Dry cereals: refined corn, wheat, rice. Potatoes prepared any way without skins, refined macaroni, spaghetti, noodles, refined rice.  Avoid:  Bread, rolls, or crackers made with whole wheat, multi-grains, rye, bran seeds, nuts, or coconut. Corn tortillas or taco shells. Cereals containing whole grains, multi-grains, bran, coconut, nuts, raisins. Cooked or dry oatmeal. Coarse wheat cereals, granola. Cereals advertised as "high-fiber." Potato skins. Whole grain pasta, wild or brown rice. Popcorn. Sweet potatoes, yams. Sweet rolls, doughnuts, waffles, pancakes, sweet breads. Vegetables  Recommended: Strained tomato and vegetable juices. Most well-cooked and canned vegetables without seeds. Fresh: Tender lettuce, cucumber without the skin, cabbage, spinach, bean sprouts.  Avoid: Fresh, cooked, or canned: Artichokes, baked beans, beet greens, broccoli, Brussels sprouts, corn, kale, legumes, peas, sweet potatoes. Cooked: Green or red cabbage, spinach. Avoid large servings of any vegetables because vegetables shrink when cooked, and they contain more fiber per serving than fresh vegetables. Fruit  Recommended: Cooked or canned: Apricots, applesauce, cantaloupe, cherries, fruit cocktail, grapefruit, grapes, kiwi, mandarin oranges, peaches, pears, plums, watermelon. Fresh: Apples without skin, ripe banana, grapes, cantaloupe, cherries, grapefruit, peaches, oranges, plums. Keep servings limited to  cup or 1 piece.  Avoid: Fresh: Apples with skin, apricots, mangoes, pears, raspberries,  strawberries. Prune  juice, stewed or dried prunes. Dried fruits, raisins, dates. Large servings of all fresh fruits. Protein  Recommended: Ground or well-cooked tender beef, ham, veal, lamb, pork, or poultry. Eggs. Fish, oysters, shrimp, lobster, other seafoods. Liver, organ meats.  Avoid: Tough, fibrous meats with gristle. Peanut butter, smooth or chunky. Cheese, nuts, seeds, legumes, dried peas, beans, lentils. Dairy  Recommended: Yogurt, lactose-free milk, kefir, drinkable yogurt, buttermilk, soy milk, or plain hard cheese.  Avoid: Milk, chocolate milk, beverages made with milk, such as milkshakes. Soups  Recommended: Bouillon, broth, or soups made from allowed foods. Any strained soup.  Avoid: Soups made from vegetables that are not allowed, cream or milk-based soups. Desserts and Sweets  Recommended: Sugar-free gelatin, sugar-free frozen ice pops made without sugar alcohol.  Avoid: Plain cakes and cookies, pie made with fruit, pudding, custard, cream pie. Gelatin, fruit, ice, sherbet, frozen ice pops. Ice cream, ice milk without nuts. Plain hard candy, honey, jelly, molasses, syrup, sugar, chocolate syrup, gumdrops, marshmallows. Fats and Oils  Recommended: Limit fats to less than 8 tsp per day.  Avoid: Seeds, nuts, olives, avocados. Margarine, butter, cream, mayonnaise, salad oils, plain salad dressings. Plain gravy, crisp bacon without rind. Beverages  Recommended: Water, decaffeinated teas, oral rehydration solutions, sugar-free beverages not sweetened with sugar alcohols.  Avoid: Fruit juices, caffeinated beverages (coffee, tea, soda), alcohol, sports drinks, or lemon-lime soda. Condiments  Recommended: Ketchup, mustard, horseradish, vinegar, cocoa powder. Spices in moderation: allspice, basil, bay leaves, celery powder or leaves, cinnamon, cumin powder, curry powder, ginger, mace, marjoram, onion or garlic powder, oregano, paprika, parsley flakes, ground pepper, rosemary, sage, savory,  tarragon, thyme, turmeric.  Avoid: Coconut, honey. Document Released: 08/11/2003 Document Revised: 02/13/2012 Document Reviewed: 10/05/2011 [facility] Patient Information 2014 [place].

## 2012-11-27 NOTE — Progress Notes (Signed)
Primary Care Physician: Glo Herring., MD  Primary Gastroenterologist:  Garfield Cornea, MD   Chief Complaint  Patient presents with  . Diarrhea    HPI: Thomas [name] is a 72 y.o. male here for urgent OV. Patient called in with c/o bad diarrhea and rectal bleeding. Down seven pounds since 07/2012. Last EGD in February 2014, duodenal ulcer with surrounding erosions felt to be the cause of the haziness seen at the head of the pancreas and duodenum on recent noncontrast CT. Biopsy showed chronic duodenitis but no villous atrophy/blunting. No H. pylori. Colonoscopy in September 2013 showed lymphocytic colitis, diverticulosis.  Couple of weeks ago had to force to have BM. Since then diarrhea. BM every 30 min to 1 hours. Hemorrhoids started bleeding. Today not as bad. Last BM 8 hours ago. Took 1/2 Imodium this AM. Some nocturnal BMs. 5-10 per day. No solid stools. Lots of gas, stomach rolling. Some nausea, no vomiting. Hospital 3 weeks ago, stayed over night. For nausea, given zofran. Developed sinusitis and received antibiotics. Also had abx for RMSF. No heartburn, indigestion.  Vitals - 1 value per visit 11/27/2012 07/15/2012 07/07/2012 03/07/2012  Weight (lb) 169.8  176.2 186    Current Outpatient Prescriptions  Medication Sig Dispense Refill  . amLODipine (NORVASC) 10 MG tablet Take 10 mg by mouth daily.      . cholecalciferol (VITAMIN D) 1000 UNITS tablet Take 1,000 Units by mouth daily.      . fish oil-omega-3 fatty acids 1000 MG capsule Take 1 g by mouth 4 (four) times daily.      . furosemide (LASIX) 40 MG tablet Take 40 mg by mouth daily.       . hydrALAZINE (APRESOLINE) 50 MG tablet Take 50 mg by mouth 2 (two) times daily.       . isosorbide mononitrate (IMDUR) 30 MG 24 hr tablet Take 30 mg by mouth daily.       Marland Kitchen LANTUS SOLOSTAR 100 UNIT/ML injection Inject 15 Units into the skin at bedtime as needed. According to sugar level.      Marland Kitchen losartan-hydrochlorothiazide (HYZAAR) 100-12.5 MG per  tablet Take 1 tablet by mouth daily.       . metoprolol (LOPRESSOR) 100 MG tablet Take 100 mg by mouth 2 (two) times daily.       Marland Kitchen omeprazole (PRILOSEC) 20 MG capsule Take 1 capsule (20 mg total) by mouth daily.  30 capsule  2  . ondansetron (ZOFRAN) 4 MG tablet Take 1 tablet (4 mg total) by mouth every 6 (six) hours as needed for nausea.  30 tablet  0  . Potassium 99 MG TABS Take 2 tablets by mouth daily.      . pravastatin (PRAVACHOL) 40 MG tablet Take 40 mg by mouth daily.       . vitamin C (ASCORBIC ACID) 500 MG tablet Take 500 mg by mouth 2 (two) times daily.       No current facility-administered medications for this visit.    Allergies as of 11/27/2012 - Review Complete 11/27/2012  Allergen Reaction Noted  . Codeine Other (See Comments)     ROS:  General: Negative for anorexia, fever, chills, fatigue. +weight loss and weakness. ENT: Negative for hoarseness, difficulty swallowing , nasal congestion. CV: Negative for chest pain, angina, palpitations, dyspnea on exertion, peripheral edema.  Respiratory: Negative for dyspnea at rest, dyspnea on exertion, cough, sputum, wheezing.  GI: See history of present illness. GU:  Negative for dysuria, hematuria, urinary incontinence, urinary frequency,  nocturnal urination.  Endo: Negative for unusual weight change. See above   Physical Examination:   BP 163/93  Pulse 71  Temp(Src) 98.3 F (36.8 C) (Oral)  Ht 5\' 11"  (1.803 m)  Wt 169 lb 12.8 oz (77.021 kg)  BMI 23.69 kg/m2  General: Well-nourished, well-developed in no acute distress. Accompanied by grandson. Eyes: No icterus. Mouth: Oropharyngeal mucosa moist and pink , no lesions erythema or exudate. Lungs: Clear to auscultation bilaterally.  Heart: Regular rate and rhythm, no murmurs rubs or gallops.  Abdomen: Bowel sounds are normal, nontender, nondistended, no hepatosplenomegaly or masses, no abdominal bruits or hernia , no rebound or guarding.   Extremities: No lower  extremity edema. No clubbing or deformities. Neuro: Alert and oriented x 4   Skin: Warm and dry, no jaundice.   Psych: Alert and cooperative, normal mood and affect.  Labs:  Lab Results  Component Value Date   WBC 10.5 07/07/2012   HGB 12.0* 07/07/2012   HCT 35.5* 07/07/2012   MCV 79.1 07/07/2012   PLT 326 07/07/2012      Imaging Studies: No results found.

## 2012-11-29 NOTE — Assessment & Plan Note (Addendum)
72 y/o with h/o lymphocytic colitis who presents with several week h/o loose frequent stools associated with nausea and ongoing weight loss. C/o "gas". +recent antibiotic use. Suspect brbpr secondary to hemorrhoids. Colonoscopy up to date (02/2012).   1. C.Diff PCR 2. Stool culture 3. Giardia 4. Anusol HC  5. Zofran for nausea. 6. OV in 2 months with Dr. Gala Romney to f/u diarrhea/weight loss.

## 2012-12-01 ENCOUNTER — Telehealth: Payer: Self-pay | Admitting: Gastroenterology

## 2012-12-01 LAB — GIARDIA/CRYPTOSPORIDIUM (EIA): Giardia Screen (EIA): NEGATIVE

## 2012-12-01 NOTE — Progress Notes (Signed)
Cc PCP 

## 2012-12-01 NOTE — Telephone Encounter (Signed)
Reviewed records from [facility], recent hospitalization. Labs from 11/02/2012. BUN 52, creatinine 4.01, total bilirubin 0.6, alkaline phosphatase 142, AST 20, ALT 13, albumin 3.9, amylase 89, lipase 28, white blood cell count 11,400, hemoglobin 13.2, hematocrit 40.2, MCV 79.3, platelets 265,000.   He needs to follow-up with PCP regarding elevated Creatinine. Appears it has been elevated on multiple occasions.   Please send copy Creatinine from [facility] and our last 2 creatinines to PCP.

## 2012-12-02 ENCOUNTER — Telehealth: Payer: Self-pay

## 2012-12-02 NOTE — Telephone Encounter (Signed)
Faxed all blood work results to Dr. Gerarda Fraction.

## 2012-12-02 NOTE — Telephone Encounter (Signed)
Pt called about stool studies results. Informed him that the results just came in on culture today and they are all negative. Pt stated he wasn't having diarrhea any more but he was having multiple soft bowel movements daily. He said they were almost normal now but has went 5x today already. No fever, no abd pain, no blood. Pt c/o a lot of gas. Informed him that LSL has already left for the day but if she has any further recommendations I will call him tomorrow.

## 2012-12-03 NOTE — Telephone Encounter (Signed)
Since he is doing better. Recommend probiotic once daily, such as [place]. He can also use Imodium sparingly, no more than two tablets per day.  If BMs not back to normal within 2-3 weeks, we would consider another course of Uceris for microscopic colitis.

## 2012-12-03 NOTE — Telephone Encounter (Signed)
Pt is aware.  

## 2012-12-03 NOTE — Progress Notes (Signed)
Quick Note:  Patient aware that stool studies are negative. ______

## 2013-02-16 ENCOUNTER — Other Ambulatory Visit (HOSPITAL_COMMUNITY): Payer: Self-pay | Admitting: Internal Medicine

## 2013-02-16 DIAGNOSIS — R42 Dizziness and giddiness: Secondary | ICD-10-CM

## 2013-02-16 DIAGNOSIS — R51 Headache: Secondary | ICD-10-CM

## 2013-02-18 ENCOUNTER — Ambulatory Visit (HOSPITAL_COMMUNITY)
Admission: RE | Admit: 2013-02-18 | Discharge: 2013-02-18 | Disposition: A | Discharge status: Home / Self Care | Payer: Medicare Other | Source: Ambulatory Visit | Attending: Internal Medicine | Admitting: Internal Medicine

## 2013-02-18 DIAGNOSIS — R42 Dizziness and giddiness: Secondary | ICD-10-CM

## 2013-02-18 DIAGNOSIS — R51 Headache: Secondary | ICD-10-CM | POA: Insufficient documentation

## 2013-05-20 ENCOUNTER — Encounter (HOSPITAL_COMMUNITY): Payer: Self-pay | Admitting: Pharmacy Technician

## 2013-05-20 ENCOUNTER — Other Ambulatory Visit: Payer: Self-pay

## 2013-05-20 ENCOUNTER — Encounter (HOSPITAL_COMMUNITY): Payer: Self-pay | Admitting: *Deleted

## 2013-05-20 ENCOUNTER — Other Ambulatory Visit (HOSPITAL_COMMUNITY): Payer: Self-pay | Admitting: *Deleted

## 2013-05-20 MED ORDER — DEXTROSE 5 % IV SOLN
1.5000 g | INTRAVENOUS | Status: AC
  Administered 2013-05-21: 1.5 g via INTRAVENOUS
  Filled 2013-05-20: qty 1.5

## 2013-05-21 ENCOUNTER — Ambulatory Visit (HOSPITAL_COMMUNITY)
Admission: RE | Admit: 2013-05-21 | Discharge: 2013-05-21 | Disposition: A | Discharge status: Home / Self Care | Payer: Medicare Other | Source: Ambulatory Visit | Attending: Vascular Surgery | Admitting: Vascular Surgery

## 2013-05-21 ENCOUNTER — Encounter (HOSPITAL_COMMUNITY): Payer: Medicare Other | Admitting: Anesthesiology

## 2013-05-21 ENCOUNTER — Ambulatory Visit (HOSPITAL_COMMUNITY): Payer: Medicare Other

## 2013-05-21 ENCOUNTER — Encounter (HOSPITAL_COMMUNITY)
Admission: RE | Disposition: A | Discharge status: Home / Self Care | Payer: Self-pay | Source: Ambulatory Visit | Attending: Vascular Surgery

## 2013-05-21 ENCOUNTER — Other Ambulatory Visit: Payer: Self-pay | Admitting: *Deleted

## 2013-05-21 ENCOUNTER — Encounter (HOSPITAL_COMMUNITY): Payer: Self-pay | Admitting: Anesthesiology

## 2013-05-21 ENCOUNTER — Ambulatory Visit (HOSPITAL_COMMUNITY): Payer: Medicare Other | Admitting: Anesthesiology

## 2013-05-21 DIAGNOSIS — F17200 Nicotine dependence, unspecified, uncomplicated: Secondary | ICD-10-CM | POA: Insufficient documentation

## 2013-05-21 DIAGNOSIS — Z8674 Personal history of sudden cardiac arrest: Secondary | ICD-10-CM | POA: Insufficient documentation

## 2013-05-21 DIAGNOSIS — N186 End stage renal disease: Secondary | ICD-10-CM

## 2013-05-21 DIAGNOSIS — J449 Chronic obstructive pulmonary disease, unspecified: Secondary | ICD-10-CM | POA: Insufficient documentation

## 2013-05-21 DIAGNOSIS — Z992 Dependence on renal dialysis: Secondary | ICD-10-CM | POA: Insufficient documentation

## 2013-05-21 DIAGNOSIS — I120 Hypertensive chronic kidney disease with stage 5 chronic kidney disease or end stage renal disease: Secondary | ICD-10-CM | POA: Insufficient documentation

## 2013-05-21 DIAGNOSIS — I252 Old myocardial infarction: Secondary | ICD-10-CM | POA: Insufficient documentation

## 2013-05-21 DIAGNOSIS — J4489 Other specified chronic obstructive pulmonary disease: Secondary | ICD-10-CM | POA: Insufficient documentation

## 2013-05-21 DIAGNOSIS — E119 Type 2 diabetes mellitus without complications: Secondary | ICD-10-CM | POA: Insufficient documentation

## 2013-05-21 HISTORY — DX: Gastro-esophageal reflux disease without esophagitis: K21.9

## 2013-05-21 HISTORY — DX: Shortness of breath: R06.02

## 2013-05-21 HISTORY — PX: INSERTION OF DIALYSIS CATHETER: SHX1324

## 2013-05-21 HISTORY — DX: Sleep apnea, unspecified: G47.30

## 2013-05-21 HISTORY — DX: Unspecified cataract: H26.9

## 2013-05-21 HISTORY — DX: Chronic obstructive pulmonary disease, unspecified: J44.9

## 2013-05-21 HISTORY — DX: Pneumonia, unspecified organism: J18.9

## 2013-05-21 HISTORY — DX: Chronic kidney disease, unspecified: N18.9

## 2013-05-21 LAB — GLUCOSE, CAPILLARY: Glucose-Capillary: 95 mg/dL (ref 70–99)

## 2013-05-21 LAB — POCT I-STAT 4, (NA,K, GLUC, HGB,HCT)
Hemoglobin: 10.9 g/dL — ABNORMAL LOW (ref 13.0–17.0)
Potassium: 4.3 mEq/L (ref 3.5–5.1)

## 2013-05-21 SURGERY — INSERTION OF DIALYSIS CATHETER
Anesthesia: Monitor Anesthesia Care | Site: Neck | Laterality: Right

## 2013-05-21 MED ORDER — OXYCODONE-ACETAMINOPHEN 5-325 MG PO TABS
1.0000 | ORAL_TABLET | ORAL | Status: DC | PRN
Start: 2013-05-21 — End: 2013-06-22

## 2013-05-21 MED ORDER — FENTANYL CITRATE 0.05 MG/ML IJ SOLN: 25.0000 ug | INTRAMUSCULAR | Status: DC | PRN

## 2013-05-21 MED ORDER — SODIUM CHLORIDE 0.9 % IV SOLN: INTRAVENOUS | Status: DC

## 2013-05-21 MED ORDER — HEPARIN SODIUM (PORCINE) 1000 UNIT/ML IJ SOLN
INTRAMUSCULAR | Status: DC | PRN
  Administered 2013-05-21: 5 mL

## 2013-05-21 MED ORDER — SODIUM CHLORIDE 0.9 % IV SOLN
INTRAVENOUS | Status: DC
  Administered 2013-05-21: 20 mL/h via INTRAVENOUS

## 2013-05-21 MED ORDER — SODIUM CHLORIDE 0.9 % IR SOLN
Status: DC | PRN
  Administered 2013-05-21: 1000 mL

## 2013-05-21 MED ORDER — HEPARIN SODIUM (PORCINE) 1000 UNIT/ML IJ SOLN
INTRAMUSCULAR | Status: AC
  Filled 2013-05-21: qty 1

## 2013-05-21 MED ORDER — LIDOCAINE-EPINEPHRINE (PF) 1 %-1:200000 IJ SOLN
INTRAMUSCULAR | Status: AC
  Filled 2013-05-21: qty 10

## 2013-05-21 MED ORDER — LIDOCAINE-EPINEPHRINE (PF) 1 %-1:200000 IJ SOLN
INTRAMUSCULAR | Status: DC | PRN
  Administered 2013-05-21: 23 mL

## 2013-05-21 MED ORDER — DEXTROSE 50 % IV SOLN
25.0000 mL | Freq: Once | INTRAVENOUS | Status: AC
  Administered 2013-05-21: 25 mL via INTRAVENOUS
  Filled 2013-05-21: qty 50

## 2013-05-21 MED ORDER — SODIUM CHLORIDE 0.9 % IV SOLN
INTRAVENOUS | Status: DC | PRN
  Administered 2013-05-21: 12:00:00 via INTRAVENOUS

## 2013-05-21 MED ORDER — DEXTROSE 50 % IV SOLN
INTRAVENOUS | Status: AC
  Administered 2013-05-21: 25 mL via INTRAVENOUS
  Filled 2013-05-21: qty 50

## 2013-05-21 MED ORDER — DEXTROSE 5 % IV SOLN
INTRAVENOUS | Status: DC | PRN
  Administered 2013-05-21: 14:00:00 via INTRAVENOUS

## 2013-05-21 MED ORDER — SODIUM CHLORIDE 0.9 % IR SOLN
Status: DC | PRN
  Administered 2013-05-21: 13:00:00

## 2013-05-21 SURGICAL SUPPLY — 48 items
ADH SKN CLS LQ APL DERMABOND (GAUZE/BANDAGES/DRESSINGS) ×1
BAG DECANTER FOR FLEXI CONT (MISCELLANEOUS) ×2 IMPLANT
CATH CANNON HEMO 15F 50CM (CATHETERS) IMPLANT
CATH CANNON HEMO 15FR 19 (HEMODIALYSIS SUPPLIES) IMPLANT
CATH CANNON HEMO 15FR 23CM (HEMODIALYSIS SUPPLIES) ×1 IMPLANT
CATH CANNON HEMO 15FR 31CM (HEMODIALYSIS SUPPLIES) IMPLANT
CATH CANNON HEMO 15FR 32 (HEMODIALYSIS SUPPLIES) IMPLANT
CATH CANNON HEMO 15FR 32CM (HEMODIALYSIS SUPPLIES) IMPLANT
CATH STRAIGHT 5FR 65CM (CATHETERS) IMPLANT
COVER PROBE W GEL 5X96 (DRAPES) ×2 IMPLANT
COVER SURGICAL LIGHT HANDLE (MISCELLANEOUS) ×2 IMPLANT
DERMABOND ADHESIVE PROPEN (GAUZE/BANDAGES/DRESSINGS) ×1
DERMABOND ADVANCED .7 DNX6 (GAUZE/BANDAGES/DRESSINGS) IMPLANT
DRAPE C-ARM 42X72 X-RAY (DRAPES) ×2 IMPLANT
DRAPE CHEST BREAST 15X10 FENES (DRAPES) ×2 IMPLANT
GAUZE SPONGE 2X2 8PLY STRL LF (GAUZE/BANDAGES/DRESSINGS) ×1 IMPLANT
GAUZE SPONGE 4X4 16PLY XRAY LF (GAUZE/BANDAGES/DRESSINGS) ×2 IMPLANT
GLOVE BIO SURGEON STRL SZ7 (GLOVE) ×2 IMPLANT
GLOVE BIOGEL PI IND STRL 7.0 (GLOVE) IMPLANT
GLOVE BIOGEL PI IND STRL 7.5 (GLOVE) ×1 IMPLANT
GLOVE BIOGEL PI INDICATOR 7.0 (GLOVE) ×2
GLOVE BIOGEL PI INDICATOR 7.5 (GLOVE) ×1
GOWN STRL NON-REIN LRG LVL3 (GOWN DISPOSABLE) ×6 IMPLANT
KIT BASIN OR (CUSTOM PROCEDURE TRAY) ×2 IMPLANT
KIT ROOM TURNOVER OR (KITS) ×2 IMPLANT
NDL 18GX1X1/2 (RX/OR ONLY) (NEEDLE) ×1 IMPLANT
NDL HYPO 25GX1X1/2 BEV (NEEDLE) ×1 IMPLANT
NEEDLE 18GX1X1/2 (RX/OR ONLY) (NEEDLE) ×2 IMPLANT
NEEDLE HYPO 25GX1X1/2 BEV (NEEDLE) ×2 IMPLANT
NS IRRIG 1000ML POUR BTL (IV SOLUTION) ×2 IMPLANT
PACK SURGICAL SETUP 50X90 (CUSTOM PROCEDURE TRAY) ×2 IMPLANT
PAD ARMBOARD 7.5X6 YLW CONV (MISCELLANEOUS) ×4 IMPLANT
SET MICROPUNCTURE 5F STIFF (MISCELLANEOUS) IMPLANT
SOAP 2 % CHG 4 OZ (WOUND CARE) ×2 IMPLANT
SPONGE GAUZE 2X2 STER 10/PKG (GAUZE/BANDAGES/DRESSINGS) ×1
SUT ETHILON 3 0 PS 1 (SUTURE) ×2 IMPLANT
SUT MNCRL AB 4-0 PS2 18 (SUTURE) ×2 IMPLANT
SYR 20CC LL (SYRINGE) ×2 IMPLANT
SYR 30ML LL (SYRINGE) IMPLANT
SYR 3ML LL SCALE MARK (SYRINGE) ×2 IMPLANT
SYR 5ML LL (SYRINGE) ×2 IMPLANT
SYR CONTROL 10ML LL (SYRINGE) ×2 IMPLANT
SYRINGE 10CC LL (SYRINGE) ×2 IMPLANT
TAPE CLOTH SURG 4X10 WHT LF (GAUZE/BANDAGES/DRESSINGS) ×1 IMPLANT
TOWEL OR 17X24 6PK STRL BLUE (TOWEL DISPOSABLE) ×2 IMPLANT
TOWEL OR 17X26 10 PK STRL BLUE (TOWEL DISPOSABLE) ×2 IMPLANT
WATER STERILE IRR 1000ML POUR (IV SOLUTION) ×1 IMPLANT
WIRE AMPLATZ SS-J .035X180CM (WIRE) IMPLANT

## 2013-05-21 NOTE — H&P (Signed)
[facility] [place]  Brief History and Physical  History of Present Illness  Thomas [name] is a 72 y.o. male who presents with chief complaint: end stage renal disease.  The patient presents today for tunneled dialysis catheter placement.    The patient is RHD dominant.  He has not been evaluated for permanent access yet though his kidney failure has been chronic in nature.    Past Medical History  Diagnosis Date  . HTN (hypertension)   . Diabetes mellitus   . Hypercholesterolemia   . Myocardial infarction     X2  . Cardiac arrest   . Lymphocytic colitis 2013    tcs by RMR  . Diverticulosis 2013    tcs by RMR  . Tobacco abuse   . Diverticulosis   . Erosive esophagitis   . Schatzki's ring   . COPD (chronic obstructive pulmonary disease)   . Shortness of breath   . Pneumonia   . Sleep apnea     uses O2 only  . Chronic kidney disease     CKD  . GERD (gastroesophageal reflux disease)   . Hiatal hernia     pt not aware of this  . Cataract     Past Surgical History  Procedure Laterality Date  . Cholecystectomy    . Cardiac stents      5  . Coronary angioplasty    . Esophagogastroduodenoscopy  May 2009    RMR: non-critical Schatzki's ring, sp dilation with 40 F Maloney, small inlet patch, mild erosive relufx esophagitis  . Colonoscopy  December 2007    adenomatous polyps, sigmoid diverticula  . Colonoscopy  02/06/2012    Dr. Gala Romney- colonic diverticulosis, bx consistent with lymphocytic colitis  . Esophagogastroduodenoscopy N/A 07/15/2012    Dr. Gala Romney- hiatal hernia, duodenal ulcer with surrounding erosions. this may well be related to the "haziness" sen around the head of the pancreas and duodenum on recent noncontrast ct scan and may have much to do with his abdominal pain.  . Vasectomy      History   Social History  . Marital Status: Married    Spouse Name: N/A    Number of Children: 3  . Years of Education: N/A   Occupational History   . 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 Smoking status: Current Every Day Smoker -- 1.00 packs/day    Types: Cigarettes  . Smokeless tobacco: Never Used  . Alcohol Use: No  . Drug Use: No  . Sexual Activity: Not on file   Other Topics Concern  . Not on file   Social History Narrative  . No narrative on file    Family History  Problem Relation Age of Onset  . Colon cancer Neg Hx     No current facility-administered medications on file prior to encounter.   Current Outpatient Prescriptions on File Prior to Encounter  Medication Sig Dispense Refill  . cholecalciferol (VITAMIN D) 1000 UNITS tablet Take 1,000 Units by mouth daily.      . fish oil-omega-3 fatty acids 1000 MG capsule Take 1 g by mouth 2 (two) times daily.       . furosemide (LASIX) 40 MG tablet Take 40 mg by mouth daily as needed for fluid.       . isosorbide mononitrate (IMDUR) 30 MG 24 hr tablet Take 30 mg by mouth daily.       Marland Kitchen LANTUS SOLOSTAR 100 UNIT/ML injection Inject 15 Units  into the skin at bedtime as needed. According to sugar level.      Marland Kitchen losartan-hydrochlorothiazide (HYZAAR) 100-12.5 MG per tablet Take 1 tablet by mouth daily.       . metoprolol (LOPRESSOR) 100 MG tablet Take 100 mg by mouth 2 (two) times daily.       . pravastatin (PRAVACHOL) 40 MG tablet Take 40 mg by mouth daily.       . vitamin C (ASCORBIC ACID) 500 MG tablet Take 500 mg by mouth 2 (two) times daily.        Allergies  Allergen Reactions  . Codeine Other (See Comments)    Muscle Spasms   REVIEW OF SYSTEMS:  (Positives checked otherwise negative)  CARDIOVASCULAR:  [x]  chest pain, [x]  chest pressure, []  palpitations, []  shortness of breath when laying flat, [x]  shortness of breath with exertion,  []  pain in feet when walking, []  pain in feet when laying flat, []  history of blood clot in veins (DVT), []  history of phlebitis, []  swelling in legs, []  varicose veins  PULMONARY:  []  productive cough, []   asthma, []  wheezing  NEUROLOGIC:  []  weakness in arms or legs, []  numbness in arms or legs, []  difficulty speaking or slurred speech, []  temporary loss of vision in one eye, []  dizziness  HEMATOLOGIC:  []  bleeding problems, []  problems with blood clotting too easily  MUSCULOSKEL:  []  joint pain, []  joint swelling  GASTROINTEST:  []  vomiting blood, []  blood in stool     GENITOURINARY:  []  burning with urination, []  blood in urine, [x]  ESRD  PSYCHIATRIC:  []  history of major depression  INTEGUMENTARY:  []  rashes, []  ulcers  CONSTITUTIONAL:  []  fever, []  chills  PRE-ADM LIVING: [x]  Home, []  Nursing home, []  Homeless  AMB STATUS: [x]  Walking, []  Walking w/ Assistance, []  Wheelchair, [] Bed ridden    Physical Examination  Filed Vitals:   05/21/13 0910  Pulse: 57  Temp: 97 F (36.1 C)  TempSrc: Oral  Resp: 20  Height: 5\' 11"  (1.803 m)  Weight: 180 lb (81.647 kg)    General: WDWN, somnolent   Pulmonary: Sym exp, good air movt, CTAB, no rales, rhonchi, & wheezing  Cardiac: RRR, Nl S1, S2, no Murmurs, rubs or gallops  Gastrointestinal: soft, NTND, -G/R, - HSM, - masses, - CVAT B  Musculoskeletal: M/S 5/5 throughout , Extremities without ischemic changes   Laboratory See Trail is a 72 y.o. male who presents with: new end stage renal disease.   The patient is scheduled for: tunneled dialysis catheter placement The patient is aware the risks of tunneled dialysis catheter placement include but are not limited to: bleeding, infection, central venous injury, pneumothorax, possible venous stenosis, possible malpositioning in the venous system, and possible infections related to long-term catheter presence.   The patient is aware of the risks and agrees to proceed.  Adele Barthel, MD [facility] [place] Office: [phone] Pager: [phone]  05/21/2013, 9:42 AM

## 2013-05-21 NOTE — Transfer of Care (Signed)
Immediate Anesthesia Transfer of Care Note  Patient: Thomas [name]  Procedure(s) Performed: Procedure(s): INSERTION OF DIALYSIS CATHETER (Right)  Patient Location: PACU  Anesthesia Type:MAC  Level of Consciousness: awake, alert , oriented and patient cooperative  Airway & Oxygen Therapy: Patient Spontanous Breathing and Patient connected to nasal cannula oxygen  Post-op Assessment: Report given to PACU RN, Post -op Vital signs reviewed and stable and Patient moving all extremities  Post vital signs: Reviewed and stable  Complications: No apparent anesthesia complications

## 2013-05-21 NOTE — Anesthesia Preprocedure Evaluation (Addendum)
Anesthesia Evaluation  Patient identified by MRN, date of birth, ID band Patient awake    Reviewed: Allergy & Precautions, H&P , NPO status , Patient's Chart, lab work & pertinent test results  Airway Mallampati: I      Dental   Pulmonary shortness of breath and with exertion, sleep apnea , pneumonia -, COPDCurrent Smoker,          Cardiovascular hypertension, + CAD and + Past MI     Neuro/Psych    GI/Hepatic Neg liver ROS, PUD,   Endo/Other  diabetes  Renal/GU Renal disease     Musculoskeletal   Abdominal   Peds  Hematology  (+) anemia ,   Anesthesia Other Findings   Reproductive/Obstetrics                          Anesthesia Physical Anesthesia Plan  ASA: IV  Anesthesia Plan: MAC   Post-op Pain Management:    Induction: Intravenous  Airway Management Planned: Simple Face Mask  Additional Equipment:   Intra-op Plan:   Post-operative Plan:   Informed Consent: I have reviewed the patients History and Physical, chart, labs and discussed the procedure including the risks, benefits and alternatives for the proposed anesthesia with the patient or authorized representative who has indicated his/her understanding and acceptance.   Dental advisory given  Plan Discussed with: CRNA, Anesthesiologist and Surgeon  Anesthesia Plan Comments:         Anesthesia Quick Evaluation

## 2013-05-21 NOTE — Progress Notes (Signed)
Dr. Oletta Lamas notified of CBG orders recieved

## 2013-05-21 NOTE — Preoperative (Signed)
Beta Blockers   Reason not to administer Beta Blockers:Mepoprolol 0630 today

## 2013-05-21 NOTE — Op Note (Signed)
OPERATIVE NOTE  PROCEDURE: 1. Right internal jugular vein tunneled dialysis catheter placement 2. Right internal jugular vein cannulation under ultrasound guidance  PRE-OPERATIVE DIAGNOSIS: end-stage renal failure  POST-OPERATIVE DIAGNOSIS: same as above  SURGEON: Adele Barthel, MD  ANESTHESIA: local and MAC  ESTIMATED BLOOD LOSS: 30 cc  FINDING(S): 1.  Tips of the catheter in the right atrium on fluoroscopy 2.  No obvious pneumothorax on fluoroscopy  SPECIMEN(S):  none  INDICATIONS:   Thomas ATTWOOD is a 72 y.o. male who presents with new end stage renal disease.  The patient presents for tunneled dialysis catheter placement.  The patient is aware the risks of tunneled dialysis catheter placement include but are not limited to: bleeding, infection, central venous injury, pneumothorax, possible venous stenosis, possible malpositioning in the venous system, and possible infections related to long-term catheter presence.  The patient was aware of these risks and agreed to proceed.  DESCRIPTION: After written full informed consent was obtained from the patient, the patient was taken back to the operating room.  Prior to induction, the patient was given IV antibiotics.  After obtaining adequate sedation, the patient was prepped and draped in the standard fashion for a chest or neck tunneled dialysis catheter placement.  I anesthesized the neck cannulation site with local anesthetic.  Under ultrasound guidance, the right internal jugular vein was cannulated with the 18 gauge needle.  A J-wire was then placed down in the right ventricle under fluroscopic guidance.  The wire was then secured in place with a clamp to the drapes.  The cannulation site, the catheter exit site, and tract for the subcutaneous tunnel were then anesthesized with a total of 23 cc of a 1:1 mixture of 0.5% Marcaine without epinepherine and 1% Lidocaine with epinepherine.  I then made stab incisions at the neck and exit  sites.   I dissected from the exit site to the cannulation site with a metal tunneler.   The subcutaneous tunnel was dilated by passing a plastic dilator over the metal dissector. The wire was then unclamped and I removed the needle.  The skin tract and venotomy was dilated serially with dilators.  Finally, the dilator-sheath was placed under fluroscopic guidance into the superior vena cava.  The dilator and wire were removed.  A 23 cm Diatek catheter was placed under fluoroscopic guidance down into the right atrium.  The sheath was broken and peeled away while holding the catheter cuff at the level of the skin.  The back end of this catheter was transected, revealing the two lumens of this catheter.  The ports were docked onto these two lumens.  The catheter hub was then screwed into place.  Each port was tested by aspirating and flushing.  No resistance was noted.  Each port was then thoroughly flushed with heparinized saline.  The catheter was secured in placed with two interrupted stitches of 3-0 Nylon tied to the catheter.  The neck incision was closed with a U-stitch of 4-0 Monocryl.  The neck and chest incision were cleaned and sterile bandages applied.  Each port was then loaded with concentrated heparin (1000 Units/mL) at the manufacturer recommended volumes to each port.  Sterile caps were applied to each port.  On completion fluoroscopy, the tips of the catheter were in the right atrium, and there was no evidence of pneumothorax.  COMPLICATIONS: none  CONDITION: stable  Adele Barthel, MD [facility] [place] Office: [phone] Pager: [phone]  05/21/2013, 2:31 PM

## 2013-05-22 ENCOUNTER — Telehealth: Payer: Self-pay | Admitting: Vascular Surgery

## 2013-05-22 NOTE — Telephone Encounter (Addendum)
Message copied by Gena Fray on Fri May 22, 2013 10:04 AM ------      Message from: Alfonso Patten      Created: Thu May 21, 2013  3:55 PM       ORDER IN      ----- Message -----         From: Conrad [place], MD         Sent: 05/21/2013   2:32 PM           To: Patrici Ranks, Alfonso Patten, RN            Thomas [name]      ES:[id number]      06-30-1940            PROCEDURE:      1. Right internal jugular vein tunneled dialysis catheter placement      2. Right internal jugular vein cannulation under ultrasound guidance            Follow-up: Pt needs to be seen in office with next available provider for access placement            Orders(s) for follow-up: B arm vein mapping             ------  05/22/13: lm for pt, dpm

## 2013-05-22 NOTE — Anesthesia Postprocedure Evaluation (Signed)
  Anesthesia Post-op Note  Patient: Thomas [name]  Procedure(s) Performed: Procedure(s): INSERTION OF DIALYSIS CATHETER (Right)  Patient Location: PACU  Anesthesia Type:General  Level of Consciousness: awake  Airway and Oxygen Therapy: Patient Spontanous Breathing  Post-op Pain: mild  Post-op Assessment: Post-op Vital signs reviewed  Post-op Vital Signs: Reviewed  Complications: No apparent anesthesia complications

## 2013-05-25 ENCOUNTER — Other Ambulatory Visit: Payer: Self-pay | Admitting: *Deleted

## 2013-05-25 DIAGNOSIS — N186 End stage renal disease: Secondary | ICD-10-CM

## 2013-05-25 DIAGNOSIS — Z01810 Encounter for preprocedural cardiovascular examination: Secondary | ICD-10-CM

## 2013-05-25 NOTE — Addendum Note (Signed)
Addendum created 05/25/13 1747 by Finis Bud, MD   Modules edited: Anesthesia Responsible Staff

## 2013-05-26 ENCOUNTER — Encounter (HOSPITAL_COMMUNITY): Payer: Self-pay | Admitting: Vascular Surgery

## 2013-05-27 ENCOUNTER — Encounter: Payer: Self-pay | Admitting: Vascular Surgery

## 2013-05-29 ENCOUNTER — Encounter (HOSPITAL_COMMUNITY): Payer: Medicare Other

## 2013-05-29 ENCOUNTER — Ambulatory Visit: Payer: Medicare Other | Admitting: Vascular Surgery

## 2013-05-29 ENCOUNTER — Other Ambulatory Visit (HOSPITAL_COMMUNITY): Payer: Medicare Other

## 2013-06-05 NOTE — Progress Notes (Signed)
REVIEWED.  

## 2013-06-18 ENCOUNTER — Encounter: Payer: Self-pay | Admitting: Vascular Surgery

## 2013-06-19 ENCOUNTER — Encounter: Payer: Self-pay | Admitting: Vascular Surgery

## 2013-06-19 ENCOUNTER — Ambulatory Visit (INDEPENDENT_AMBULATORY_CARE_PROVIDER_SITE_OTHER): Payer: Medicare Other | Admitting: Vascular Surgery

## 2013-06-19 ENCOUNTER — Ambulatory Visit (HOSPITAL_COMMUNITY)
Admission: RE | Admit: 2013-06-19 | Discharge: 2013-06-19 | Disposition: A | Discharge status: Home / Self Care | Payer: Medicare Other | Source: Ambulatory Visit | Attending: Vascular Surgery | Admitting: Vascular Surgery

## 2013-06-19 ENCOUNTER — Ambulatory Visit (INDEPENDENT_AMBULATORY_CARE_PROVIDER_SITE_OTHER)
Admission: RE | Admit: 2013-06-19 | Discharge: 2013-06-19 | Disposition: A | Discharge status: Home / Self Care | Payer: Medicare Other | Source: Ambulatory Visit | Attending: Vascular Surgery | Admitting: Vascular Surgery

## 2013-06-19 ENCOUNTER — Other Ambulatory Visit: Payer: Self-pay | Admitting: *Deleted

## 2013-06-19 VITALS — BP 145/71 | HR 74 | Ht 71.0 in | Wt 174.8 lb

## 2013-06-19 DIAGNOSIS — Z01810 Encounter for preprocedural cardiovascular examination: Secondary | ICD-10-CM | POA: Insufficient documentation

## 2013-06-19 DIAGNOSIS — N186 End stage renal disease: Secondary | ICD-10-CM | POA: Insufficient documentation

## 2013-06-19 NOTE — Progress Notes (Signed)
Referred by:  Redmond School, MD [id number] [place], [place] [address]  Reason for referral: New access  History of Present Illness  Thomas [name] is a 73 y.o. (07-14-1940) male who presents for evaluation for permanent access.  The patient is right hand dominant.  The patient has not had previous access procedures.  Previous central venous cannulation procedures include: RIJV TDC placed by myself.  The patient has never had a PPM placed.   Past Medical History  Diagnosis Date  . HTN (hypertension)   . Diabetes mellitus   . Hypercholesterolemia   . Myocardial infarction     X2  . Cardiac arrest   . Lymphocytic colitis 2013    tcs by RMR  . Diverticulosis 2013    tcs by RMR  . Tobacco abuse   . Diverticulosis   . Erosive esophagitis   . Schatzki's ring   . COPD (chronic obstructive pulmonary disease)   . Shortness of breath   . Pneumonia   . Sleep apnea     uses O2 only  . Chronic kidney disease     CKD  . GERD (gastroesophageal reflux disease)   . Hiatal hernia     pt not aware of this  . Cataract     Past Surgical History  Procedure Laterality Date  . Cholecystectomy    . Cardiac stents      5  . Coronary angioplasty    . Esophagogastroduodenoscopy  May 2009    RMR: non-critical Schatzki's ring, sp dilation with 41 F Maloney, small inlet patch, mild erosive relufx esophagitis  . Colonoscopy  December 2007    adenomatous polyps, sigmoid diverticula  . Colonoscopy  02/06/2012    Dr. Gala Romney- colonic diverticulosis, bx consistent with lymphocytic colitis  . Esophagogastroduodenoscopy N/A 07/15/2012    Dr. Gala Romney- hiatal hernia, duodenal ulcer with surrounding erosions. this may well be related to the "haziness" sen around the head of the pancreas and duodenum on recent noncontrast ct scan and may have much to do with his abdominal pain.  . Vasectomy    . Insertion of dialysis catheter Right 05/21/2013    Procedure: INSERTION OF DIALYSIS  CATHETER;  Surgeon: Conrad [place], MD;  Location: [facility];  Service: Vascular;  Laterality: Right;    History   Social History  . Marital Status: Married    Spouse Name: N/A    Number of Children: 3  . Years of Education: N/A   Occupational History  . 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 Smoking status: Current Every Day Smoker -- 0.50 packs/day    Types: Cigarettes  . Smokeless tobacco: Never Used  . Alcohol Use: No  . Drug Use: No  . Sexual Activity: Not on file   Other Topics Concern  . Not on file   Social History Narrative  . No narrative on file    Family History  Problem Relation Age of Onset  . Colon cancer Neg Hx    Current Outpatient Prescriptions on File Prior to Visit  Medication Sig Dispense Refill  . amLODipine (NORVASC) 5 MG tablet Take 5 mg by mouth daily.      Marland Kitchen aspirin EC 81 MG tablet Take 81 mg by mouth daily.      . calcium acetate (PHOSLO) 667 MG capsule Take 1,334 mg by mouth 3 (three) times daily with meals.      . cholecalciferol (VITAMIN D) 1000 UNITS  tablet Take 1,000 Units by mouth daily.      Marland Kitchen dicyclomine (BENTYL) 20 MG tablet Take 20 mg by mouth every 6 (six) hours as needed for spasms.      . fish oil-omega-3 fatty acids 1000 MG capsule Take 1 g by mouth 2 (two) times daily.       . furosemide (LASIX) 40 MG tablet Take 40 mg by mouth daily as needed for fluid.       Marland Kitchen gemfibrozil (LOPID) 600 MG tablet Take 600 mg by mouth 2 (two) times daily before a meal.      . hyoscyamine (LEVSIN, ANASPAZ) 0.125 MG tablet Take 0.125 mg by mouth daily as needed for bladder spasms or cramping.      . isosorbide mononitrate (IMDUR) 30 MG 24 hr tablet Take 30 mg by mouth daily.       Marland Kitchen LANTUS SOLOSTAR 100 UNIT/ML injection Inject 15 Units into the skin at bedtime as needed. According to sugar level.      Marland Kitchen losartan-hydrochlorothiazide (HYZAAR) 100-12.5 MG per tablet Take 1 tablet by mouth daily.       . metoprolol (LOPRESSOR) 100 MG  tablet Take 100 mg by mouth 2 (two) times daily.       Marland Kitchen omeprazole (PRILOSEC) 20 MG capsule Take 20 mg by mouth 2 (two) times daily before a meal.      . ondansetron (ZOFRAN) 4 MG tablet Take 4 mg by mouth every 6 (six) hours as needed for nausea or vomiting.      Marland Kitchen oxyCODONE-acetaminophen (ROXICET) 5-325 MG per tablet Take 1-2 tablets by mouth every 4 (four) hours as needed for severe pain.  10 tablet  0  . pravastatin (PRAVACHOL) 40 MG tablet Take 40 mg by mouth daily.       . vitamin C (ASCORBIC ACID) 500 MG tablet Take 500 mg by mouth 2 (two) times daily.       No current facility-administered medications on file prior to visit.    Allergies  Allergen Reactions  . Codeine Other (See Comments)    Muscle Spasms   REVIEW OF SYSTEMS:  (Positives checked otherwise negative)  CARDIOVASCULAR:  []  chest pain, []  chest pressure, []  palpitations, []  shortness of breath when laying flat, []  shortness of breath with exertion,  []  pain in feet when walking, []  pain in feet when laying flat, []  history of blood clot in veins (DVT), []  history of phlebitis, []  swelling in legs, []  varicose veins  PULMONARY:  [x]  productive cough, []  asthma, [x]  wheezing  NEUROLOGIC:  []  weakness in arms or legs, []  numbness in arms or legs, []  difficulty speaking or slurred speech, []  temporary loss of vision in one eye, []  dizziness  HEMATOLOGIC:  []  bleeding problems, []  problems with blood clotting too easily  MUSCULOSKEL:  []  joint pain, []  joint swelling  GASTROINTEST:  []  vomiting blood, []  blood in stool     GENITOURINARY:  []  burning with urination, []  blood in urine, [x]  ESRD: T-R-Sat  PSYCHIATRIC:  []  history of major depression  INTEGUMENTARY:  []  rashes, []  ulcers  CONSTITUTIONAL:  []  fever, []  chills  Physical Examination  Filed Vitals:   06/19/13 1343  BP: 145/71  Pulse: 74  Height: 5\' 11"  (1.803 m)  Weight: 174 lb 12.8 oz (79.289 kg)  SpO2: 92%   Body mass index is 24.39  kg/(m^2).  General: A&O x 3, WDWN  Head: Bureau/AT  Ear/Nose/Throat: Hearing grossly intact, nares w/o erythema or drainage,  oropharynx w/o Erythema/Exudate, Mallampati score: 3  Eyes: PERRLA, EOMI  Neck: Supple, no nuchal rigidity, no palpable LAD  Pulmonary: Sym exp, good air movt, CTAB, no rales, rhonchi, & wheezing  Cardiac: RRR, Nl S1, S2, no Murmurs, rubs or gallops  Vascular: Vessel Right Left  Radial Palpable Palpable  Ulnar Not Palpable Not Palpable  Brachial Palpable Palpable  Carotid Palpable, without bruit Palpable, without bruit  Aorta Not palpable N/A  Femoral Palpable Palpable  Popliteal Not palpable Not palpable  PT Palpable Palpable  DP Not Palpable Not Palpable   Gastrointestinal: soft, NTND, -G/R, - HSM, - masses, - CVAT B  Musculoskeletal: M/S 5/5 throughout , Extremities without ischemic changes , palpable distal cephalic vein on L  Neurologic: CN 2-12 intact , Pain and light touch intact in extremities , Motor exam as listed above  Psychiatric: Judgment intact, Mood & affect appropriate for pt's clinical situation  Dermatologic: See M/S exam for extremity exam, no rashes otherwise noted  Lymph : No Cervical, Axillary, or Inguinal lymphadenopathy   Non-Invasive Vascular Imaging  Vein Mapping  (Date: 06/19/2013):   R arm: acceptable vein conduits include marginal upper arm cephalic, possibly upper arm basilic  L arm: acceptable vein conduits include possible forearm cephalic, likely basilic vein  Outside Studies/Documentation 6 pages of outside documents were reviewed including: outpatient nephrology chart.  Medical Decision Making  Thomas [name] is a 73 y.o. male who presents with ESRD requiring hemodialysis  Based on vein mapping and examination, this patient's permanent access options include: L RC AVF vs staged L BVT, possible R staged BVT vs BC AVF.    As patient is right handed, I would start with an attempt at L RC AVF vs staged BVT.   If this fails, I would proceed with B arm and central venogram.  I had an extensive discussion with this patient in regards to the nature of access surgery, including risk, benefits, and alternatives.    The patient is aware that the risks of access surgery include but are not limited to: bleeding, infection, steal syndrome, nerve damage, ischemic monomelic neuropathy, failure of access to mature, and possible need for additional access procedures in the future.  The patient has agreed to proceed with the above procedure which will be scheduled 26 JAN 15.  Adele Barthel, MD [facility] [place] Office: [phone] Pager: [phone]  06/19/2013, 2:52 PM

## 2013-06-22 ENCOUNTER — Encounter (HOSPITAL_COMMUNITY): Payer: Self-pay

## 2013-06-26 ENCOUNTER — Encounter (HOSPITAL_COMMUNITY): Payer: Self-pay | Admitting: *Deleted

## 2013-06-28 MED ORDER — DEXTROSE 5 % IV SOLN
1.5000 g | INTRAVENOUS | Status: AC
  Administered 2013-06-29: 1.5 g via INTRAVENOUS
  Filled 2013-06-28: qty 1.5

## 2013-06-28 MED ORDER — SODIUM CHLORIDE 0.9 % IV SOLN: INTRAVENOUS | Status: DC

## 2013-06-29 ENCOUNTER — Encounter (HOSPITAL_COMMUNITY)
Admission: RE | Disposition: A | Discharge status: Home / Self Care | Payer: Self-pay | Source: Ambulatory Visit | Attending: Vascular Surgery

## 2013-06-29 ENCOUNTER — Encounter (HOSPITAL_COMMUNITY): Payer: Medicare Other | Admitting: Anesthesiology

## 2013-06-29 ENCOUNTER — Ambulatory Visit (HOSPITAL_COMMUNITY): Payer: Medicare Other | Admitting: Anesthesiology

## 2013-06-29 ENCOUNTER — Encounter (HOSPITAL_COMMUNITY): Payer: Self-pay | Admitting: Anesthesiology

## 2013-06-29 ENCOUNTER — Other Ambulatory Visit: Payer: Self-pay | Admitting: *Deleted

## 2013-06-29 ENCOUNTER — Ambulatory Visit (HOSPITAL_COMMUNITY)
Admission: RE | Admit: 2013-06-29 | Discharge: 2013-06-29 | Disposition: A | Discharge status: Home / Self Care | Payer: Medicare Other | Source: Ambulatory Visit | Attending: Vascular Surgery | Admitting: Vascular Surgery

## 2013-06-29 DIAGNOSIS — Z794 Long term (current) use of insulin: Secondary | ICD-10-CM | POA: Insufficient documentation

## 2013-06-29 DIAGNOSIS — Z4931 Encounter for adequacy testing for hemodialysis: Secondary | ICD-10-CM

## 2013-06-29 DIAGNOSIS — G4730 Sleep apnea, unspecified: Secondary | ICD-10-CM | POA: Insufficient documentation

## 2013-06-29 DIAGNOSIS — K5289 Other specified noninfective gastroenteritis and colitis: Secondary | ICD-10-CM | POA: Insufficient documentation

## 2013-06-29 DIAGNOSIS — N186 End stage renal disease: Secondary | ICD-10-CM

## 2013-06-29 DIAGNOSIS — Z8674 Personal history of sudden cardiac arrest: Secondary | ICD-10-CM | POA: Insufficient documentation

## 2013-06-29 DIAGNOSIS — J449 Chronic obstructive pulmonary disease, unspecified: Secondary | ICD-10-CM | POA: Insufficient documentation

## 2013-06-29 DIAGNOSIS — K219 Gastro-esophageal reflux disease without esophagitis: Secondary | ICD-10-CM | POA: Insufficient documentation

## 2013-06-29 DIAGNOSIS — Z7982 Long term (current) use of aspirin: Secondary | ICD-10-CM | POA: Insufficient documentation

## 2013-06-29 DIAGNOSIS — H269 Unspecified cataract: Secondary | ICD-10-CM | POA: Insufficient documentation

## 2013-06-29 DIAGNOSIS — E7800 Pure hypercholesterolemia, unspecified: Secondary | ICD-10-CM | POA: Insufficient documentation

## 2013-06-29 DIAGNOSIS — F17200 Nicotine dependence, unspecified, uncomplicated: Secondary | ICD-10-CM | POA: Insufficient documentation

## 2013-06-29 DIAGNOSIS — Z9981 Dependence on supplemental oxygen: Secondary | ICD-10-CM | POA: Insufficient documentation

## 2013-06-29 DIAGNOSIS — I252 Old myocardial infarction: Secondary | ICD-10-CM | POA: Insufficient documentation

## 2013-06-29 DIAGNOSIS — I120 Hypertensive chronic kidney disease with stage 5 chronic kidney disease or end stage renal disease: Secondary | ICD-10-CM | POA: Insufficient documentation

## 2013-06-29 DIAGNOSIS — E119 Type 2 diabetes mellitus without complications: Secondary | ICD-10-CM | POA: Insufficient documentation

## 2013-06-29 DIAGNOSIS — J4489 Other specified chronic obstructive pulmonary disease: Secondary | ICD-10-CM | POA: Insufficient documentation

## 2013-06-29 DIAGNOSIS — K5730 Diverticulosis of large intestine without perforation or abscess without bleeding: Secondary | ICD-10-CM | POA: Insufficient documentation

## 2013-06-29 HISTORY — PX: AV FISTULA PLACEMENT: SHX1204

## 2013-06-29 HISTORY — DX: Irritable bowel syndrome, unspecified: K58.9

## 2013-06-29 HISTORY — DX: Unspecified osteoarthritis, unspecified site: M19.90

## 2013-06-29 HISTORY — DX: Anemia, unspecified: D64.9

## 2013-06-29 LAB — POCT I-STAT 4, (NA,K, GLUC, HGB,HCT)
GLUCOSE: 138 mg/dL — AB (ref 70–99)
HCT: 34 % — ABNORMAL LOW (ref 39.0–52.0)
Hemoglobin: 11.6 g/dL — ABNORMAL LOW (ref 13.0–17.0)
Potassium: 4.7 mEq/L (ref 3.7–5.3)
SODIUM: 142 meq/L (ref 137–147)

## 2013-06-29 LAB — GLUCOSE, CAPILLARY
GLUCOSE-CAPILLARY: 143 mg/dL — AB (ref 70–99)
Glucose-Capillary: 179 mg/dL — ABNORMAL HIGH (ref 70–99)

## 2013-06-29 SURGERY — ARTERIOVENOUS (AV) FISTULA CREATION
Anesthesia: Monitor Anesthesia Care | Site: Arm Lower | Laterality: Left

## 2013-06-29 MED ORDER — FENTANYL CITRATE 0.05 MG/ML IJ SOLN: 25.0000 ug | INTRAMUSCULAR | Status: DC | PRN

## 2013-06-29 MED ORDER — ROCURONIUM BROMIDE 50 MG/5ML IV SOLN
INTRAVENOUS | Status: AC
  Filled 2013-06-29: qty 1

## 2013-06-29 MED ORDER — MIDAZOLAM HCL 2 MG/2ML IJ SOLN
INTRAMUSCULAR | Status: AC
  Filled 2013-06-29: qty 2

## 2013-06-29 MED ORDER — OXYCODONE HCL 5 MG/5ML PO SOLN: 5.0000 mg | Freq: Once | ORAL | Status: DC | PRN

## 2013-06-29 MED ORDER — MIDAZOLAM HCL 5 MG/5ML IJ SOLN
INTRAMUSCULAR | Status: DC | PRN
  Administered 2013-06-29: 1 mg via INTRAVENOUS

## 2013-06-29 MED ORDER — ONDANSETRON HCL 4 MG/2ML IJ SOLN
INTRAMUSCULAR | Status: DC | PRN
  Administered 2013-06-29: 4 mg via INTRAVENOUS

## 2013-06-29 MED ORDER — LIDOCAINE HCL (CARDIAC) 20 MG/ML IV SOLN
INTRAVENOUS | Status: DC | PRN
  Administered 2013-06-29: 50 mg via INTRAVENOUS

## 2013-06-29 MED ORDER — OXYCODONE HCL 5 MG PO TABS: 5.0000 mg | ORAL_TABLET | ORAL | Status: DC | PRN

## 2013-06-29 MED ORDER — ONDANSETRON HCL 4 MG/2ML IJ SOLN
INTRAMUSCULAR | Status: AC
  Filled 2013-06-29: qty 2

## 2013-06-29 MED ORDER — ACETAMINOPHEN 160 MG/5ML PO SOLN
325.0000 mg | ORAL | Status: DC | PRN
  Filled 2013-06-29: qty 20.3

## 2013-06-29 MED ORDER — SUCCINYLCHOLINE CHLORIDE 20 MG/ML IJ SOLN
INTRAMUSCULAR | Status: AC
  Filled 2013-06-29: qty 1

## 2013-06-29 MED ORDER — PROPOFOL INFUSION 10 MG/ML OPTIME
INTRAVENOUS | Status: DC | PRN
  Administered 2013-06-29: 75 ug/kg/min via INTRAVENOUS

## 2013-06-29 MED ORDER — LIDOCAINE HCL (CARDIAC) 20 MG/ML IV SOLN
INTRAVENOUS | Status: AC
  Filled 2013-06-29: qty 5

## 2013-06-29 MED ORDER — LIDOCAINE-EPINEPHRINE (PF) 1 %-1:200000 IJ SOLN
INTRAMUSCULAR | Status: DC | PRN
  Administered 2013-06-29: 4 mL

## 2013-06-29 MED ORDER — HEPARIN SODIUM (PORCINE) 1000 UNIT/ML IJ SOLN
INTRAMUSCULAR | Status: AC
  Filled 2013-06-29: qty 1

## 2013-06-29 MED ORDER — 0.9 % SODIUM CHLORIDE (POUR BTL) OPTIME
TOPICAL | Status: DC | PRN
  Administered 2013-06-29: 1000 mL

## 2013-06-29 MED ORDER — ONDANSETRON HCL 4 MG/2ML IJ SOLN: 4.0000 mg | Freq: Once | INTRAMUSCULAR | Status: DC | PRN

## 2013-06-29 MED ORDER — FENTANYL CITRATE 0.05 MG/ML IJ SOLN
INTRAMUSCULAR | Status: DC | PRN
  Administered 2013-06-29 (×2): 25 ug via INTRAVENOUS

## 2013-06-29 MED ORDER — SODIUM CHLORIDE 0.9 % IV SOLN
INTRAVENOUS | Status: DC | PRN
  Administered 2013-06-29: 07:00:00 via INTRAVENOUS

## 2013-06-29 MED ORDER — OXYCODONE HCL 5 MG PO TABS: 5.0000 mg | ORAL_TABLET | Freq: Once | ORAL | Status: DC | PRN

## 2013-06-29 MED ORDER — NITROGLYCERIN 0.4 MG SL SUBL: 0.4000 mg | SUBLINGUAL_TABLET | SUBLINGUAL | Status: DC | PRN

## 2013-06-29 MED ORDER — THROMBIN 20000 UNITS EX SOLR
CUTANEOUS | Status: AC
  Filled 2013-06-29: qty 20000

## 2013-06-29 MED ORDER — FENTANYL CITRATE 0.05 MG/ML IJ SOLN
INTRAMUSCULAR | Status: AC
  Filled 2013-06-29: qty 5

## 2013-06-29 MED ORDER — PROPOFOL 10 MG/ML IV BOLUS
INTRAVENOUS | Status: AC
  Filled 2013-06-29: qty 20

## 2013-06-29 MED ORDER — ACETAMINOPHEN 325 MG PO TABS: 325.0000 mg | ORAL_TABLET | ORAL | Status: DC | PRN

## 2013-06-29 MED ORDER — SODIUM CHLORIDE 0.9 % IR SOLN
Status: DC | PRN
  Administered 2013-06-29: 08:00:00

## 2013-06-29 SURGICAL SUPPLY — 40 items
ADH SKN CLS APL DERMABOND .7 (GAUZE/BANDAGES/DRESSINGS) ×1
ARMBAND PINK RESTRICT EXTREMIT (MISCELLANEOUS) ×2 IMPLANT
CANISTER SUCTION 2500CC (MISCELLANEOUS) ×2 IMPLANT
CLIP TI MEDIUM 6 (CLIP) ×2 IMPLANT
CLIP TI WIDE RED SMALL 6 (CLIP) ×2 IMPLANT
COVER PROBE W GEL 5X96 (DRAPES) ×1 IMPLANT
COVER SURGICAL LIGHT HANDLE (MISCELLANEOUS) ×2 IMPLANT
DECANTER SPIKE VIAL GLASS SM (MISCELLANEOUS) ×1 IMPLANT
DERMABOND ADVANCED (GAUZE/BANDAGES/DRESSINGS) ×1
DERMABOND ADVANCED .7 DNX12 (GAUZE/BANDAGES/DRESSINGS) ×1 IMPLANT
ELECT REM PT RETURN 9FT ADLT (ELECTROSURGICAL) ×2
ELECTRODE REM PT RTRN 9FT ADLT (ELECTROSURGICAL) ×1 IMPLANT
GLOVE BIO SURGEON STRL SZ 6.5 (GLOVE) ×1 IMPLANT
GLOVE BIO SURGEON STRL SZ7 (GLOVE) ×2 IMPLANT
GLOVE BIOGEL PI IND STRL 6.5 (GLOVE) IMPLANT
GLOVE BIOGEL PI IND STRL 7.5 (GLOVE) ×1 IMPLANT
GLOVE BIOGEL PI INDICATOR 6.5 (GLOVE) ×3
GLOVE BIOGEL PI INDICATOR 7.5 (GLOVE) ×1
GLOVE SS BIOGEL STRL SZ 6.5 (GLOVE) IMPLANT
GLOVE SUPERSENSE BIOGEL SZ 6.5 (GLOVE) ×1
GLOVE SURG SS PI 7.0 STRL IVOR (GLOVE) ×2 IMPLANT
GOWN STRL REUS W/ TWL LRG LVL3 (GOWN DISPOSABLE) ×3 IMPLANT
GOWN STRL REUS W/TWL LRG LVL3 (GOWN DISPOSABLE) ×8
KIT BASIN OR (CUSTOM PROCEDURE TRAY) ×2 IMPLANT
KIT ROOM TURNOVER OR (KITS) ×2 IMPLANT
NDL HYPO 25GX1X1/2 BEV (NEEDLE) ×1 IMPLANT
NEEDLE HYPO 25GX1X1/2 BEV (NEEDLE) ×2 IMPLANT
NS IRRIG 1000ML POUR BTL (IV SOLUTION) ×2 IMPLANT
PACK CV ACCESS (CUSTOM PROCEDURE TRAY) ×2 IMPLANT
PAD ARMBOARD 7.5X6 YLW CONV (MISCELLANEOUS) ×4 IMPLANT
SPONGE SURGIFOAM ABS GEL 100 (HEMOSTASIS) IMPLANT
SUT MNCRL AB 4-0 PS2 18 (SUTURE) ×2 IMPLANT
SUT PROLENE 6 0 BV (SUTURE) ×1 IMPLANT
SUT PROLENE 7 0 BV 1 (SUTURE) ×3 IMPLANT
SUT VIC AB 3-0 SH 27 (SUTURE) ×2
SUT VIC AB 3-0 SH 27X BRD (SUTURE) ×1 IMPLANT
TOWEL OR 17X24 6PK STRL BLUE (TOWEL DISPOSABLE) ×2 IMPLANT
TOWEL OR 17X26 10 PK STRL BLUE (TOWEL DISPOSABLE) ×2 IMPLANT
UNDERPAD 30X30 INCONTINENT (UNDERPADS AND DIAPERS) ×2 IMPLANT
WATER STERILE IRR 1000ML POUR (IV SOLUTION) ×2 IMPLANT

## 2013-06-29 NOTE — Anesthesia Postprocedure Evaluation (Signed)
  Anesthesia Post-op Note  Patient: Thomas [name]  Procedure(s) Performed: Procedure(s): LEFT RADIAL/CEPHALIC FISTULA (Left)  Patient Location: PACU  Anesthesia Type:MAC  Level of Consciousness: awake, alert  and oriented  Airway and Oxygen Therapy: Patient Spontanous Breathing  Post-op Pain: mild  Post-op Assessment: Post-op Vital signs reviewed, Patient's Cardiovascular Status Stable, Respiratory Function Stable, Patent Airway, No signs of Nausea or vomiting and Pain level controlled  Post-op Vital Signs: Reviewed and stable  Complications: No apparent anesthesia complications

## 2013-06-29 NOTE — Anesthesia Procedure Notes (Signed)
Procedure Name: MAC Date/Time: 06/29/2013 7:25 AM Performed by: Jenne Campus Pre-anesthesia Checklist: Patient identified, Emergency Drugs available, Suction available, Patient being monitored and Timeout performed Patient Re-evaluated:Patient Re-evaluated prior to inductionOxygen Delivery Method: Simple face mask

## 2013-06-29 NOTE — Preoperative (Signed)
Beta Blockers   Reason not to administer Beta Blockers:Not Applicable 

## 2013-06-29 NOTE — H&P (View-Only) (Signed)
Referred by:  Redmond School, MD [id number] [place], [place] [address]  Reason for referral: New access  History of Present Illness  Thomas [name] is a 73 y.o. (12/18/1940) male who presents for evaluation for permanent access.  The patient is right hand dominant.  The patient has not had previous access procedures.  Previous central venous cannulation procedures include: RIJV TDC placed by myself.  The patient has never had a PPM placed.   Past Medical History  Diagnosis Date  . HTN (hypertension)   . Diabetes mellitus   . Hypercholesterolemia   . Myocardial infarction     X2  . Cardiac arrest   . Lymphocytic colitis 2013    tcs by RMR  . Diverticulosis 2013    tcs by RMR  . Tobacco abuse   . Diverticulosis   . Erosive esophagitis   . Schatzki's ring   . COPD (chronic obstructive pulmonary disease)   . Shortness of breath   . Pneumonia   . Sleep apnea     uses O2 only  . Chronic kidney disease     CKD  . GERD (gastroesophageal reflux disease)   . Hiatal hernia     pt not aware of this  . Cataract     Past Surgical History  Procedure Laterality Date  . Cholecystectomy    . Cardiac stents      5  . Coronary angioplasty    . Esophagogastroduodenoscopy  May 2009    RMR: non-critical Schatzki's ring, sp dilation with 30 F Maloney, small inlet patch, mild erosive relufx esophagitis  . Colonoscopy  December 2007    adenomatous polyps, sigmoid diverticula  . Colonoscopy  02/06/2012    Dr. Gala Romney- colonic diverticulosis, bx consistent with lymphocytic colitis  . Esophagogastroduodenoscopy N/A 07/15/2012    Dr. Gala Romney- hiatal hernia, duodenal ulcer with surrounding erosions. this may well be related to the "haziness" sen around the head of the pancreas and duodenum on recent noncontrast ct scan and may have much to do with his abdominal pain.  . Vasectomy    . Insertion of dialysis catheter Right 05/21/2013    Procedure: INSERTION OF DIALYSIS  CATHETER;  Surgeon: Conrad Altura, MD;  Location: [facility];  Service: Vascular;  Laterality: Right;    History   Social History  . Marital Status: Married    Spouse Name: N/A    Number of Children: 3  . Years of Education: N/A   Occupational History  . 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 Smoking status: Current Every Day Smoker -- 0.50 packs/day    Types: Cigarettes  . Smokeless tobacco: Never Used  . Alcohol Use: No  . Drug Use: No  . Sexual Activity: Not on file   Other Topics Concern  . Not on file   Social History Narrative  . No narrative on file    Family History  Problem Relation Age of Onset  . Colon cancer Neg Hx    Current Outpatient Prescriptions on File Prior to Visit  Medication Sig Dispense Refill  . amLODipine (NORVASC) 5 MG tablet Take 5 mg by mouth daily.      Marland Kitchen aspirin EC 81 MG tablet Take 81 mg by mouth daily.      . calcium acetate (PHOSLO) 667 MG capsule Take 1,334 mg by mouth 3 (three) times daily with meals.      . cholecalciferol (VITAMIN D) 1000 UNITS  tablet Take 1,000 Units by mouth daily.      Marland Kitchen dicyclomine (BENTYL) 20 MG tablet Take 20 mg by mouth every 6 (six) hours as needed for spasms.      . fish oil-omega-3 fatty acids 1000 MG capsule Take 1 g by mouth 2 (two) times daily.       . furosemide (LASIX) 40 MG tablet Take 40 mg by mouth daily as needed for fluid.       Marland Kitchen gemfibrozil (LOPID) 600 MG tablet Take 600 mg by mouth 2 (two) times daily before a meal.      . hyoscyamine (LEVSIN, ANASPAZ) 0.125 MG tablet Take 0.125 mg by mouth daily as needed for bladder spasms or cramping.      . isosorbide mononitrate (IMDUR) 30 MG 24 hr tablet Take 30 mg by mouth daily.       Marland Kitchen LANTUS SOLOSTAR 100 UNIT/ML injection Inject 15 Units into the skin at bedtime as needed. According to sugar level.      Marland Kitchen losartan-hydrochlorothiazide (HYZAAR) 100-12.5 MG per tablet Take 1 tablet by mouth daily.       . metoprolol (LOPRESSOR) 100 MG  tablet Take 100 mg by mouth 2 (two) times daily.       Marland Kitchen omeprazole (PRILOSEC) 20 MG capsule Take 20 mg by mouth 2 (two) times daily before a meal.      . ondansetron (ZOFRAN) 4 MG tablet Take 4 mg by mouth every 6 (six) hours as needed for nausea or vomiting.      Marland Kitchen oxyCODONE-acetaminophen (ROXICET) 5-325 MG per tablet Take 1-2 tablets by mouth every 4 (four) hours as needed for severe pain.  10 tablet  0  . pravastatin (PRAVACHOL) 40 MG tablet Take 40 mg by mouth daily.       . vitamin C (ASCORBIC ACID) 500 MG tablet Take 500 mg by mouth 2 (two) times daily.       No current facility-administered medications on file prior to visit.    Allergies  Allergen Reactions  . Codeine Other (See Comments)    Muscle Spasms   REVIEW OF SYSTEMS:  (Positives checked otherwise negative)  CARDIOVASCULAR:  []  chest pain, []  chest pressure, []  palpitations, []  shortness of breath when laying flat, []  shortness of breath with exertion,  []  pain in feet when walking, []  pain in feet when laying flat, []  history of blood clot in veins (DVT), []  history of phlebitis, []  swelling in legs, []  varicose veins  PULMONARY:  [x]  productive cough, []  asthma, [x]  wheezing  NEUROLOGIC:  []  weakness in arms or legs, []  numbness in arms or legs, []  difficulty speaking or slurred speech, []  temporary loss of vision in one eye, []  dizziness  HEMATOLOGIC:  []  bleeding problems, []  problems with blood clotting too easily  MUSCULOSKEL:  []  joint pain, []  joint swelling  GASTROINTEST:  []  vomiting blood, []  blood in stool     GENITOURINARY:  []  burning with urination, []  blood in urine, [x]  ESRD: T-R-Sat  PSYCHIATRIC:  []  history of major depression  INTEGUMENTARY:  []  rashes, []  ulcers  CONSTITUTIONAL:  []  fever, []  chills  Physical Examination  Filed Vitals:   06/19/13 1343  BP: 145/71  Pulse: 74  Height: 5\' 11"  (1.803 m)  Weight: 174 lb 12.8 oz (79.289 kg)  SpO2: 92%   Body mass index is 24.39  kg/(m^2).  General: A&O x 3, WDWN  Head: [place]/AT  Ear/Nose/Throat: Hearing grossly intact, nares w/o erythema or drainage,  oropharynx w/o Erythema/Exudate, Mallampati score: 3  Eyes: PERRLA, EOMI  Neck: Supple, no nuchal rigidity, no palpable LAD  Pulmonary: Sym exp, good air movt, CTAB, no rales, rhonchi, & wheezing  Cardiac: RRR, Nl S1, S2, no Murmurs, rubs or gallops  Vascular: Vessel Right Left  Radial Palpable Palpable  Ulnar Not Palpable Not Palpable  Brachial Palpable Palpable  Carotid Palpable, without bruit Palpable, without bruit  Aorta Not palpable N/A  Femoral Palpable Palpable  Popliteal Not palpable Not palpable  PT Palpable Palpable  DP Not Palpable Not Palpable   Gastrointestinal: soft, NTND, -G/R, - HSM, - masses, - CVAT B  Musculoskeletal: M/S 5/5 throughout , Extremities without ischemic changes , palpable distal cephalic vein on L  Neurologic: CN 2-12 intact , Pain and light touch intact in extremities , Motor exam as listed above  Psychiatric: Judgment intact, Mood & affect appropriate for pt's clinical situation  Dermatologic: See M/S exam for extremity exam, no rashes otherwise noted  Lymph : No Cervical, Axillary, or Inguinal lymphadenopathy   Non-Invasive Vascular Imaging  Vein Mapping  (Date: 06/19/2013):   R arm: acceptable vein conduits include marginal upper arm cephalic, possibly upper arm basilic  L arm: acceptable vein conduits include possible forearm cephalic, likely basilic vein  Outside Studies/Documentation 6 pages of outside documents were reviewed including: outpatient nephrology chart.  Medical Decision Making  SHAMIL BOSSCHER is a 73 y.o. male who presents with ESRD requiring hemodialysis  Based on vein mapping and examination, this patient's permanent access options include: L RC AVF vs staged L BVT, possible R staged BVT vs BC AVF.    As patient is right handed, I would start with an attempt at L RC AVF vs staged BVT.   If this fails, I would proceed with B arm and central venogram.  I had an extensive discussion with this patient in regards to the nature of access surgery, including risk, benefits, and alternatives.    The patient is aware that the risks of access surgery include but are not limited to: bleeding, infection, steal syndrome, nerve damage, ischemic monomelic neuropathy, failure of access to mature, and possible need for additional access procedures in the future.  The patient has agreed to proceed with the above procedure which will be scheduled 26 JAN 15.  Adele Barthel, MD [facility] Office: [phone] Pager: [phone]  06/19/2013, 2:52 PM

## 2013-06-29 NOTE — Transfer of Care (Signed)
Immediate Anesthesia Transfer of Care Note  Patient: Thomas [name]  Procedure(s) Performed: Procedure(s): LEFT RADIAL/CEPHALIC FISTULA (Left)  Patient Location: PACU  Anesthesia Type:MAC  Level of Consciousness: awake, alert , oriented and patient cooperative  Airway & Oxygen Therapy: Patient Spontanous Breathing and Patient connected to nasal cannula oxygen  Post-op Assessment: Report given to PACU RN and Post -op Vital signs reviewed and stable  Post vital signs: Reviewed  Complications: No apparent anesthesia complications

## 2013-06-29 NOTE — Op Note (Signed)
OPERATIVE NOTE   PROCEDURE: left radiocephaic arteriovenous fistula placement  PRE-OPERATIVE DIAGNOSIS: end stage renal disease   POST-OPERATIVE DIAGNOSIS: same as above   SURGEON: Tyann Niehaus LIANG-YU, MD  ANESTHESIA: local and MAC  ESTIMATED BLOOD LOSS: 50 cc  FINDING(S): 1.  Palpable thrill with dopplerable radial signal at end of case  SPECIMEN(S):  none  INDICATIONS:   Thomas [name] is a 73 y.o. male who presents with end stage renal disease.  The patient is scheduled for left radiocephalic arteriovenous fistula placement.  The patient is aware the risks include but are not limited to: bleeding, infection, steal syndrome, nerve damage, ischemic monomelic neuropathy, failure to mature, and need for additional procedures.  The patient is aware of the risks of the procedure and elects to proceed forward.  DESCRIPTION: After full informed written consent was obtained from the patient, the patient was brought back to the operating room and placed supine upon the operating table.  Prior to induction, the patient received IV antibiotics.   After obtaining adequate anesthesia, the patient was then prepped and draped in the standard fashion for a left arm access procedure.  I turned my attention first to identifying the patient's distal cephalic vein and radial artery.  Using SonoSite guidance, the location of these vessels were marked out on the skin.   At this point, I injected local anesthetic to obtain a field block of the wrist.  In total, I injected about 3 mL of a 1:1 mixture of 0.5% Marcaine without epinephrine and 1% lidocaine with epinephrine.  I made a transverse incision at the level of the wrist and dissected through the subcutaneous tissue and fascia to gain exposure of the radial artery.  This was noted to be 2.5 mm in diameter externally.  This was dissected out proximally and distally and controlled with vessel loops .  I then dissected out the cephalic vein.  This was  noted to be 2.5 mm in diameter externally.  The distal segment of the vein was ligated with a  2-0 silk, and the vein was transected.  The proximal segment was iinterrogated with serial dilators.  The vein accepted up to a 3 mm dilator without any difficulty.  I then instilled the heparinized saline into the vein and clamped it.  At this point, I reset my exposure of the radial artery and placed the artery under tension proximally and distally.  I made an arteriotomy with a #11 blade, and then I extended the arteriotomy with a Potts scissor.  I injected heparinized saline proximal and distal to this arteriotomy.  The vein was then sewn to the artery in an end-to-side configuration with a running stitch of 7-0 Prolene.  Prior to completing this anastomosis, I allowed the vein and artery to backbleed.  There was no evidence of clot from any vessels.  I completed the anastomosis in the usual fashion and then released all vessel loops and clamps.  There was a palpable thrill in the venous outflow, and there was a dopplerable radial pulse.  At this point, I irrigated out the surgical wound.  There was no further active bleeding.  The subcutaneous tissue was reapproximated with a running stitch of 3-0 Vicryl.  The skin was then reapproximated with a running subcuticular stitch of 4-0 Vicryl.  The skin was then cleaned, dried, and reinforced with Dermabond.  The patient tolerated this procedure well.   COMPLICATIONS: none  CONDITION: stable   Hinda Lenis, MD 06/29/2013 8:43 AM

## 2013-06-29 NOTE — Anesthesia Preprocedure Evaluation (Addendum)
Anesthesia Evaluation  Patient identified by MRN, date of birth, ID band Patient awake    Reviewed: Allergy & Precautions, H&P , NPO status , Patient's Chart, lab work & pertinent test results, reviewed documented beta blocker date and time   History of Anesthesia Complications Negative for: history of anesthetic complications  Airway Mallampati: II TM Distance: >3 FB Neck ROM: Full    Dental  (+) Edentulous Upper and Edentulous Lower   Pulmonary shortness of breath, neg sleep apnea, COPD COPD inhaler and oxygen dependent, Current Smoker,    Pulmonary exam normal       Cardiovascular Exercise Tolerance: Poor hypertension, Pt. on medications and Pt. 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   Neuro/Psych negative neurological ROS     GI/Hepatic Neg liver ROS, hiatal hernia, PUD, GERD-  Medicated and Controlled,  Endo/Other  diabetes, Well Controlled, Type 2, Insulin Dependent  Renal/GU CRF and DialysisRenal disease  negative genitourinary   Musculoskeletal negative musculoskeletal ROS (+)   Abdominal   Peds  Hematology  (+) anemia ,   Anesthesia Other Findings   Reproductive/Obstetrics                          Anesthesia Physical Anesthesia Plan  ASA: III  Anesthesia Plan: MAC   Post-op Pain Management:    Induction: Intravenous  Airway Management Planned: Mask  Additional Equipment: None  Intra-op Plan:   Post-operative Plan: Extubation in OR  Informed Consent: I have reviewed the patients History and Physical, chart, labs and discussed the procedure including the risks, benefits and alternatives for the proposed anesthesia with the patient or authorized representative who has indicated his/her understanding and acceptance.   Dental advisory given  Plan Discussed with: CRNA and  Surgeon  Anesthesia Plan Comments:         Anesthesia Quick Evaluation

## 2013-06-29 NOTE — Interval H&P Note (Signed)
History and Physical Interval Note:  06/29/2013 7:19 AM  Thomas [name]  has presented today for surgery, with the diagnosis of Chronic kidney disease  The various methods of treatment have been discussed with the patient and family. After consideration of risks, benefits and other options for treatment, the patient has consented to  Procedure(s): ARTERIOVENOUS (AV) FISTULA CREATION VS LEFT BASILAR VEIN TRANSPOSITION (Left) as a surgical intervention .  The patient's history has been reviewed, patient examined, no change in status, stable for surgery.  I have reviewed the patient's chart and labs.  Questions were answered to the patient's satisfaction.     CHEN,BRIAN LIANG-YU

## 2013-06-30 ENCOUNTER — Encounter (HOSPITAL_COMMUNITY): Payer: Self-pay | Admitting: Vascular Surgery

## 2013-07-02 NOTE — Progress Notes (Signed)
Patient states hand is cold and numb in the first two fingers.  Pt encouraged to call Dr. Bridgett Larsson at his first opportunity to get an appointment to check the blood flow to his fingers.

## 2013-08-13 ENCOUNTER — Encounter: Payer: Self-pay | Admitting: Vascular Surgery

## 2013-08-14 ENCOUNTER — Ambulatory Visit (INDEPENDENT_AMBULATORY_CARE_PROVIDER_SITE_OTHER): Payer: Self-pay | Admitting: Vascular Surgery

## 2013-08-14 ENCOUNTER — Encounter: Payer: Self-pay | Admitting: Vascular Surgery

## 2013-08-14 VITALS — BP 126/64 | HR 66 | Ht 71.0 in | Wt 181.8 lb

## 2013-08-14 DIAGNOSIS — N186 End stage renal disease: Secondary | ICD-10-CM

## 2013-08-14 MED ORDER — CLOTRIMAZOLE 1 % EX CREA: 1.0000 "application " | TOPICAL_CREAM | Freq: Two times a day (BID) | CUTANEOUS | Status: DC

## 2013-08-14 NOTE — Progress Notes (Signed)
    Postoperative Access Visit   History of Present Illness  Thomas [name] is a 73 y.o. year old male who presents for postoperative follow-up for: L RC AVF (Date: 06/29/13).  The patient's wounds are healed.  The patient notes some nerve sx: intermittent numbness in thumb, 1st and 2nd fingers.  The patient is able to complete their activities of daily living.  The patient's current symptoms are: coolness in Left hand and intermittent numbness as noted above.  Pt also complain of tape reaction at [facility] site.  For VQI Use Only  PRE-ADM LIVING: Home  AMB STATUS: Ambulatory  Physical Examination Filed Vitals:   08/14/13 0855  BP: 126/64  Pulse: 66   R chest: erythematous skin at tape line  LUE: Incision is healed, skin feels warm, hand grip is 5/5, sensation in digits is intact, palpable thrill, bruit can be auscultated , L RC AVF is visible, on Sonosite: fistula > 6 mm throughout  Thomas [name] is a 73 y.o. year old male who presents s/p L RC AVF.  The patient's access is ready for use.  The patient's tunneled dialysis catheter can be removed after two successful cannulations and completed dialysis treatments.  Thank you for allowing [place] to participate in this patient's care.  Adele Barthel, MD [facility] Office: [phone] Pager: [phone]  08/14/2013, 9:18 AM

## 2013-08-20 ENCOUNTER — Other Ambulatory Visit: Payer: Self-pay | Admitting: *Deleted

## 2013-08-24 ENCOUNTER — Ambulatory Visit (HOSPITAL_COMMUNITY)
Admission: RE | Admit: 2013-08-24 | Discharge: 2013-08-24 | Disposition: A | Discharge status: Home / Self Care | Payer: Medicare Other | Source: Ambulatory Visit | Attending: Vascular Surgery | Admitting: Vascular Surgery

## 2013-08-24 DIAGNOSIS — N186 End stage renal disease: Secondary | ICD-10-CM

## 2013-08-24 DIAGNOSIS — Z452 Encounter for adjustment and management of vascular access device: Secondary | ICD-10-CM | POA: Insufficient documentation

## 2013-08-24 MED ORDER — LIDOCAINE HCL (PF) 2 % IJ SOLN
INTRAMUSCULAR | Status: AC
  Filled 2013-08-24: qty 10

## 2013-08-24 MED ORDER — LIDOCAINE HCL (PF) 1 % IJ SOLN
INTRAMUSCULAR | Status: AC
  Filled 2013-08-24: qty 5

## 2013-08-24 MED ORDER — LIDOCAINE HCL (PF) 1 % IJ SOLN
INTRAMUSCULAR | Status: AC
  Filled 2013-08-24: qty 2

## 2013-08-24 MED ORDER — LIDOCAINE HCL (PF) 1 % IJ SOLN
INTRAMUSCULAR | Status: DC
Start: 2013-08-24 — End: 2013-08-25
  Filled 2013-08-24: qty 5

## 2013-08-24 NOTE — Progress Notes (Signed)
Right upper chest dressing clean, dry, and intact. Pt. Dc/d.

## 2013-08-24 NOTE — Progress Notes (Signed)
[facility] Catheter Removal Procedure Note  Diagnosis: ESRD  Plan:  Remove right diatek catheter  Consent signed:  yes Time out completed:  yes Coumadin:  no PT/INR (if applicable):   Other labs:  Procedure: 1.  Sterile prepping and draping over catheter area 2. 22 ml 2% lidocaine plain instilled at removal site. 3.  right catheter removed in its entirety with cuff in tact. 4.  Complications:  none 5. Tip of catheter sent for culture:  no   Patient tolerated procedure well:  yes Pressure held, no bleeding noted, dressing applied Instructions given to the pt regarding wound care and bleeding.  Other:  Assisted Dr. Donnetta Hutching on catheter removal  Pt may have suture removed in one week.  Discussed with pt he may have it removed at HD or come to our office or his PCP since he does live in [place] and it is a 50 minute drive to [place].  Leontine Locket, PA-C 08/24/2013 12:28 PM

## 2013-08-24 NOTE — Progress Notes (Signed)
[facility] SHORT STAY H&P  CC:  Catheter removal   HPI:  This is a 73 y.o. male here for diatek catheter removal.  He had a diatek placed on 05/21/13 by Dr. Bridgett Larsson.  He had a radio-cephalic AVF placed [id number] also by Dr. Bridgett Larsson.  He has since seen Dr. Bridgett Larsson back and his AVF was ready for use.  It has been used twice without difficulty and pt is here for catheter removal.  The only new issue he has is a rash on his chest where the tape has been placed over his catheter.  The only anticoagulant he is on is a baby aspirin.    Past Medical History  Diagnosis Date  . HTN (hypertension)   . Diabetes mellitus   . Hypercholesterolemia   . Myocardial infarction     X2  . Cardiac arrest   . Lymphocytic colitis 2013    tcs by RMR  . Diverticulosis 2013    tcs by RMR  . Tobacco abuse   . Diverticulosis   . Erosive esophagitis   . Schatzki's ring   . COPD (chronic obstructive pulmonary disease)   . Shortness of breath   . Pneumonia   . Sleep apnea     uses O2 only  . GERD (gastroesophageal reflux disease)   . Hiatal hernia     pt not aware of this  . Cataract   . Chronic kidney disease     CKD, hemodialysis Tues/Thurs/Sat  . Arthritis     hands, back  . Anemia   . Irritable bowel syndrome (IBS)     FH:  Non-Contributory  History   Social History  . Marital Status: Married    Spouse Name: N/A    Number of Children: 3  . Years of Education: N/A   Occupational History  . 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 Smoking status: Current Every Day Smoker -- 0.50 packs/day    Types: Cigarettes  . Smokeless tobacco: Never Used  . Alcohol Use: No  . Drug Use: No  . Sexual Activity: Not on file   Other Topics Concern  . Not on file   Social History Narrative  . No narrative on file    Allergies  Allergen Reactions  . Codeine Other (See Comments)    Muscle Spasms    Current Outpatient Prescriptions  Medication Sig Dispense Refill   . amLODipine (NORVASC) 5 MG tablet Take 5 mg by mouth daily.      Marland Kitchen aspirin EC 81 MG tablet Take 81 mg by mouth daily.      . butalbital-acetaminophen-caffeine (FIORICET, ESGIC) 50-325-40 MG per tablet Take 1 tablet by mouth 2 (two) times daily as needed for headache.      . calcium acetate (PHOSLO) 667 MG capsule Take 1,334 mg by mouth 3 (three) times daily with meals.      . calcium acetate (PHOSLO) 667 MG tablet       . cholecalciferol (VITAMIN D) 1000 UNITS tablet Take 1,000 Units by mouth 2 (two) times daily.       . clotrimazole (LOTRIMIN) 1 % cream Apply 1 application topically 2 (two) times daily.  30 g  1  . Cyanocobalamin (VITAMIN B-12 PO) Take 1 tablet by mouth 2 (two) times daily.      Marland Kitchen dicyclomine (BENTYL) 20 MG tablet Take 20 mg by mouth every 6 (six) hours as needed for spasms.      Marland Kitchen  fish oil-omega-3 fatty acids 1000 MG capsule Take 2 g by mouth 2 (two) times daily.       Marland Kitchen gemfibrozil (LOPID) 600 MG tablet Take 600 mg by mouth 2 (two) times daily before a meal.      . hyoscyamine (LEVSIN, ANASPAZ) 0.125 MG tablet Take 0.125 mg by mouth daily as needed for bladder spasms or cramping.      . isosorbide mononitrate (IMDUR) 30 MG 24 hr tablet Take 30 mg by mouth daily.       Marland Kitchen LANTUS SOLOSTAR 100 UNIT/ML injection Inject 20 Units into the skin at bedtime. According to sugar level.      Marland Kitchen losartan-hydrochlorothiazide (HYZAAR) 100-12.5 MG per tablet Take 1 tablet by mouth daily.       . magnesium oxide (MAG-OX) 400 MG tablet Take 400 mg by mouth daily.      . metoprolol (LOPRESSOR) 100 MG tablet Take 100 mg by mouth 2 (two) times daily.       Marland Kitchen NITROSTAT 0.4 MG SL tablet Place 0.4 mg under the tongue every 5 (five) minutes as needed for chest pain.       Marland Kitchen omeprazole (PRILOSEC) 20 MG capsule Take 20 mg by mouth 2 (two) times daily before a meal.      . ondansetron (ZOFRAN) 8 MG tablet Take 8 mg by mouth daily as needed for nausea or vomiting.       Marland Kitchen OVER THE COUNTER MEDICATION Take  1 tablet by mouth daily. Fiber Therapy tablet      . oxyCODONE (ROXICODONE) 5 MG immediate release tablet Take 1 tablet (5 mg total) by mouth every 4 (four) hours as needed for severe pain.  30 tablet  0  . POTASSIUM GLUCONATE PO Take 1 tablet by mouth 2 (two) times daily.      . pravastatin (PRAVACHOL) 40 MG tablet Take 40 mg by mouth daily.       . VENTOLIN HFA 108 (90 BASE) MCG/ACT inhaler Inhale 2 puffs into the lungs every 6 (six) hours as needed for wheezing or shortness of breath.       . vitamin C (ASCORBIC ACID) 500 MG tablet Take 500 mg by mouth 2 (two) times daily.       No current facility-administered medications for this encounter.    ROS:  See HPI  PHYSICAL EXAM  There were no vitals filed for this visit.  Gen:  Well developed well nourished HEENT:  normocephalic Neck:  Right IJ catheter in place. Heart:  RRR Lungs:  Non-labored Abdomen:  Soft NT/ND Extremities:  + thrill/bruit within fistula left wrist.  The left radial/ulnar pulse is palpable as well as the right radial pulse. Skin:  No obvious rashes Neuro:  In tact.  Lab/X-ray:  Impression: This is a 73 y.o. male here for diatek catheter removal  Plan:  Removal of right diatek catheter  Leontine Locket, PA-C [facility] [phone] 08/24/2013 12:16 PM

## 2013-12-30 ENCOUNTER — Telehealth: Payer: Self-pay | Admitting: General Practice

## 2013-12-30 NOTE — Telephone Encounter (Signed)
Patient will be seen urgently on 8/5 by AS, he was an inpatient at [facility] the week of 7/27.  Please retrieve those records, prior to his office visit.  Routing to [employer]

## 2013-12-31 NOTE — Telephone Encounter (Signed)
Requested Records from [facility] will be faxing them.

## 2013-12-31 NOTE — Telephone Encounter (Signed)
Records are in the cart for appt

## 2014-01-06 ENCOUNTER — Telehealth: Payer: Self-pay | Admitting: Gastroenterology

## 2014-01-06 ENCOUNTER — Ambulatory Visit: Payer: Medicare Other | Admitting: Gastroenterology

## 2014-01-06 ENCOUNTER — Encounter: Payer: Self-pay | Admitting: Gastroenterology

## 2014-01-06 NOTE — Telephone Encounter (Signed)
Please send letter.

## 2014-01-06 NOTE — Telephone Encounter (Signed)
Pt was a no show

## 2014-01-06 NOTE — Telephone Encounter (Signed)
Mailed letter °

## 2014-01-18 ENCOUNTER — Ambulatory Visit: Payer: Medicare Other | Admitting: Gastroenterology

## 2014-05-11 NOTE — Patient Instructions (Signed)
Your procedure is scheduled on: 05/17/2014  Report to [facility] at  1130  AM.  Call this number if you have problems the morning of surgery: [phone]   Do not eat food or drink liquids :After Midnight.      Take these medicines the morning of surgery with A SIP OF WATER: amlodipine, imdur, metoprolol, hyzaar, prilosec, zofran, oxycodone. Take only 1/2 of your insulin dosage the night before. Take your ventolin, sumbicort and spiriva before you come.   Do not wear jewelry, make-up or nail polish.  Do not wear lotions, powders, or perfumes.   Do not shave 48 hours prior to surgery.  Do not bring valuables to the hospital.  Contacts, dentures or bridgework may not be worn into surgery.  Leave suitcase in the car. After surgery it may be brought to your room.  For patients admitted to the hospital, checkout time is 11:00 AM the day of discharge.   Patients discharged the day of surgery will not be allowed to drive home.  :     Please read over the following fact sheets that you were given: Coughing and Deep Breathing, Surgical Site Infection Prevention, Anesthesia Post-op Instructions and Care and Recovery After Surgery    Cataract A cataract is a clouding of the lens of the eye. When a lens becomes cloudy, vision is reduced based on the degree and nature of the clouding. Many cataracts reduce vision to some degree. Some cataracts make people more near-sighted as they develop. Other cataracts increase glare. Cataracts that are ignored and become worse can sometimes look white. The white color can be seen through the pupil. CAUSES   Aging. However, cataracts may occur at any age, even in newborns.   Certain drugs.   Trauma to the eye.   Certain diseases such as diabetes.   Specific eye diseases such as chronic inflammation inside the eye or a sudden attack of a rare form of glaucoma.   Inherited or acquired medical problems.  SYMPTOMS   Gradual, progressive drop in vision in the  affected eye.   Severe, rapid visual loss. This most often happens when trauma is the cause.  DIAGNOSIS  To detect a cataract, an eye doctor examines the lens. Cataracts are best diagnosed with an exam of the eyes with the pupils enlarged (dilated) by drops.  TREATMENT  For an early cataract, vision may improve by using different eyeglasses or stronger lighting. If that does not help your vision, surgery is the only effective treatment. A cataract needs to be surgically removed when vision loss interferes with your everyday activities, such as driving, reading, or watching TV. A cataract may also have to be removed if it prevents examination or treatment of another eye problem. Surgery removes the cloudy lens and usually replaces it with a substitute lens (intraocular lens, IOL).  At a time when both you and your doctor agree, the cataract will be surgically removed. If you have cataracts in both eyes, only one is usually removed at a time. This allows the operated eye to heal and be out of danger from any possible problems after surgery (such as infection or poor wound healing). In rare cases, a cataract may be doing damage to your eye. In these cases, your caregiver may advise surgical removal right away. The vast majority of people who have cataract surgery have better vision afterward. HOME CARE INSTRUCTIONS  If you are not planning surgery, you may be asked to do the following:  Use different eyeglasses.   Use stronger or brighter lighting.   Ask your eye doctor about reducing your medicine dose or changing medicines if it is thought that a medicine caused your cataract. Changing medicines does not make the cataract go away on its own.   Become familiar with your surroundings. Poor vision can lead to injury. Avoid bumping into things on the affected side. You are at a higher risk for tripping or falling.   Exercise extreme care when driving or operating machinery.   Wear sunglasses if you  are sensitive to bright light or experiencing problems with glare.  SEEK IMMEDIATE MEDICAL CARE IF:   You have a worsening or sudden vision loss.   You notice redness, swelling, or increasing pain in the eye.   You have a fever.  Document Released: 05/21/2005 Document Revised: 05/10/2011 Document Reviewed: 01/12/2011 [facility] Patient Information 2012 [place].PATIENT INSTRUCTIONS POST-ANESTHESIA  IMMEDIATELY FOLLOWING SURGERY:  Do not drive or operate machinery for the first twenty four hours after surgery.  Do not make any important decisions for twenty four hours after surgery or while taking narcotic pain medications or sedatives.  If you develop intractable nausea and vomiting or a severe headache please notify your doctor immediately.  FOLLOW-UP:  Please make an appointment with your surgeon as instructed. You do not need to follow up with anesthesia unless specifically instructed to do so.  WOUND CARE INSTRUCTIONS (if applicable):  Keep a dry clean dressing on the anesthesia/puncture wound site if there is drainage.  Once the wound has quit draining you may leave it open to air.  Generally you should leave the bandage intact for twenty four hours unless there is drainage.  If the epidural site drains for more than 36-48 hours please call the anesthesia department.  QUESTIONS?:  Please feel free to call your physician or the hospital operator if you have any questions, and they will be happy to assist you.

## 2014-05-12 ENCOUNTER — Encounter (HOSPITAL_COMMUNITY): Payer: Self-pay

## 2014-05-12 ENCOUNTER — Encounter (HOSPITAL_COMMUNITY)
Admission: RE | Admit: 2014-05-12 | Discharge: 2014-05-12 | Disposition: A | Discharge status: Home / Self Care | Payer: Medicare Other | Source: Ambulatory Visit | Attending: Ophthalmology | Admitting: Ophthalmology

## 2014-05-12 ENCOUNTER — Other Ambulatory Visit: Payer: Self-pay

## 2014-05-12 DIAGNOSIS — Z01810 Encounter for preprocedural cardiovascular examination: Secondary | ICD-10-CM | POA: Insufficient documentation

## 2014-05-12 HISTORY — DX: Polyneuropathy, unspecified: G62.9

## 2014-05-12 NOTE — Pre-Procedure Instructions (Signed)
Patient given information to sign up for my chart at home. 

## 2014-05-14 MED ORDER — PHENYLEPHRINE HCL 2.5 % OP SOLN
OPHTHALMIC | Status: AC
  Filled 2014-05-14: qty 15

## 2014-05-14 MED ORDER — TETRACAINE HCL 0.5 % OP SOLN
OPHTHALMIC | Status: AC
  Filled 2014-05-14: qty 2

## 2014-05-14 MED ORDER — LIDOCAINE HCL (PF) 1 % IJ SOLN
INTRAMUSCULAR | Status: AC
  Filled 2014-05-14: qty 2

## 2014-05-14 MED ORDER — CYCLOPENTOLATE-PHENYLEPHRINE OP SOLN OPTIME - NO CHARGE
OPHTHALMIC | Status: AC
  Filled 2014-05-14: qty 2

## 2014-05-14 MED ORDER — NEOMYCIN-POLYMYXIN-DEXAMETH 3.5-10000-0.1 OP SUSP
OPHTHALMIC | Status: AC
  Filled 2014-05-14: qty 5

## 2014-05-14 MED ORDER — LIDOCAINE HCL 3.5 % OP GEL
OPHTHALMIC | Status: AC
  Filled 2014-05-14: qty 1

## 2014-05-17 ENCOUNTER — Encounter (HOSPITAL_COMMUNITY): Payer: Self-pay | Admitting: *Deleted

## 2014-05-17 ENCOUNTER — Ambulatory Visit (HOSPITAL_COMMUNITY): Payer: Medicare Other | Admitting: Anesthesiology

## 2014-05-17 ENCOUNTER — Ambulatory Visit (HOSPITAL_COMMUNITY)
Admission: RE | Admit: 2014-05-17 | Discharge: 2014-05-17 | Disposition: A | Discharge status: Home / Self Care | Payer: Medicare Other | Source: Ambulatory Visit | Attending: Ophthalmology | Admitting: Ophthalmology

## 2014-05-17 ENCOUNTER — Encounter (HOSPITAL_COMMUNITY)
Admission: RE | Disposition: A | Discharge status: Home / Self Care | Payer: Self-pay | Source: Ambulatory Visit | Attending: Ophthalmology

## 2014-05-17 DIAGNOSIS — H25812 Combined forms of age-related cataract, left eye: Secondary | ICD-10-CM | POA: Diagnosis present

## 2014-05-17 HISTORY — PX: CATARACT EXTRACTION W/PHACO: SHX586

## 2014-05-17 LAB — GLUCOSE, CAPILLARY: GLUCOSE-CAPILLARY: 199 mg/dL — AB (ref 70–99)

## 2014-05-17 SURGERY — PHACOEMULSIFICATION, CATARACT, WITH IOL INSERTION
Anesthesia: Monitor Anesthesia Care | Site: Eye | Laterality: Left

## 2014-05-17 MED ORDER — LACTATED RINGERS IV SOLN
INTRAVENOUS | Status: DC
  Administered 2014-05-17: 12:00:00 via INTRAVENOUS

## 2014-05-17 MED ORDER — NEOMYCIN-POLYMYXIN-DEXAMETH 3.5-10000-0.1 OP SUSP
OPHTHALMIC | Status: DC | PRN
  Administered 2014-05-17: 2 [drp] via OPHTHALMIC

## 2014-05-17 MED ORDER — PROVISC 10 MG/ML IO SOLN
INTRAOCULAR | Status: DC | PRN
  Administered 2014-05-17: 0.85 mL via INTRAOCULAR

## 2014-05-17 MED ORDER — LIDOCAINE 3.5 % OP GEL OPTIME - NO CHARGE
OPHTHALMIC | Status: DC | PRN
  Administered 2014-05-17: 1 [drp] via OPHTHALMIC

## 2014-05-17 MED ORDER — LIDOCAINE HCL (PF) 1 % IJ SOLN
INTRAMUSCULAR | Status: DC | PRN
  Administered 2014-05-17: .8 mL

## 2014-05-17 MED ORDER — CYCLOPENTOLATE-PHENYLEPHRINE 0.2-1 % OP SOLN
1.0000 [drp] | OPHTHALMIC | Status: AC
  Administered 2014-05-17 (×3): 1 [drp] via OPHTHALMIC

## 2014-05-17 MED ORDER — EPINEPHRINE HCL 1 MG/ML IJ SOLN
INTRAMUSCULAR | Status: AC
  Filled 2014-05-17: qty 1

## 2014-05-17 MED ORDER — SODIUM CHLORIDE 0.9 % IV SOLN
INTRAVENOUS | Status: DC | PRN
  Administered 2014-05-17: 12:00:00 via INTRAVENOUS

## 2014-05-17 MED ORDER — TETRACAINE HCL 0.5 % OP SOLN
1.0000 [drp] | OPHTHALMIC | Status: AC
  Administered 2014-05-17 (×3): 1 [drp] via OPHTHALMIC

## 2014-05-17 MED ORDER — FENTANYL CITRATE 0.05 MG/ML IJ SOLN
25.0000 ug | INTRAMUSCULAR | Status: AC
Start: 2014-05-17 — End: 2014-05-17
  Administered 2014-05-17: 25 ug via INTRAVENOUS
  Filled 2014-05-17: qty 2

## 2014-05-17 MED ORDER — BSS IO SOLN
INTRAOCULAR | Status: DC | PRN
  Administered 2014-05-17: 13:00:00

## 2014-05-17 MED ORDER — POVIDONE-IODINE 5 % OP SOLN
OPHTHALMIC | Status: DC | PRN
  Administered 2014-05-17: 1 via OPHTHALMIC

## 2014-05-17 MED ORDER — MIDAZOLAM HCL 2 MG/2ML IJ SOLN
1.0000 mg | INTRAMUSCULAR | Status: DC | PRN
  Administered 2014-05-17: 2 mg via INTRAVENOUS
  Filled 2014-05-17: qty 2

## 2014-05-17 MED ORDER — PHENYLEPHRINE HCL 2.5 % OP SOLN
1.0000 [drp] | OPHTHALMIC | Status: AC
  Administered 2014-05-17 (×3): 1 [drp] via OPHTHALMIC

## 2014-05-17 MED ORDER — LIDOCAINE HCL 3.5 % OP GEL
1.0000 "application " | Freq: Once | OPHTHALMIC | Status: AC
  Administered 2014-05-17: 1 via OPHTHALMIC

## 2014-05-17 MED ORDER — BSS IO SOLN
INTRAOCULAR | Status: DC | PRN
  Administered 2014-05-17: 30 mL via INTRAOCULAR

## 2014-05-17 SURGICAL SUPPLY — 35 items
CAPSULAR TENSION RING-AMO (OPHTHALMIC RELATED) IMPLANT
CLOTH BEACON ORANGE TIMEOUT ST (SAFETY) ×1 IMPLANT
EYE SHIELD UNIVERSAL CLEAR (GAUZE/BANDAGES/DRESSINGS) ×1 IMPLANT
GLOVE BIO SURGEON STRL SZ 6.5 (GLOVE) IMPLANT
GLOVE BIOGEL PI IND STRL 6.5 (GLOVE) IMPLANT
GLOVE BIOGEL PI IND STRL 7.0 (GLOVE) IMPLANT
GLOVE BIOGEL PI IND STRL 7.5 (GLOVE) IMPLANT
GLOVE BIOGEL PI INDICATOR 6.5 (GLOVE) ×2
GLOVE BIOGEL PI INDICATOR 7.0 (GLOVE) ×1
GLOVE BIOGEL PI INDICATOR 7.5 (GLOVE)
GLOVE ECLIPSE 6.5 STRL STRAW (GLOVE) IMPLANT
GLOVE ECLIPSE 7.0 STRL STRAW (GLOVE) IMPLANT
GLOVE ECLIPSE 7.5 STRL STRAW (GLOVE) IMPLANT
GLOVE EXAM NITRILE LRG STRL (GLOVE) ×1 IMPLANT
GLOVE EXAM NITRILE MD LF STRL (GLOVE) IMPLANT
GLOVE SKINSENSE NS SZ6.5 (GLOVE)
GLOVE SKINSENSE NS SZ7.0 (GLOVE)
GLOVE SKINSENSE STRL SZ6.5 (GLOVE) IMPLANT
GLOVE SKINSENSE STRL SZ7.0 (GLOVE) IMPLANT
GOWN STRL REUS W/ TWL LRG LVL3 (GOWN DISPOSABLE) IMPLANT
GOWN STRL REUS W/TWL LRG LVL3 (GOWN DISPOSABLE) ×4
KIT VITRECTOMY (OPHTHALMIC RELATED) IMPLANT
PAD ARMBOARD 7.5X6 YLW CONV (MISCELLANEOUS) ×1 IMPLANT
PROC W NO LENS (INTRAOCULAR LENS)
PROC W SPEC LENS (INTRAOCULAR LENS)
PROCESS W NO LENS (INTRAOCULAR LENS) IMPLANT
PROCESS W SPEC LENS (INTRAOCULAR LENS) IMPLANT
RETRACTOR IRIS SIGHTPATH (OPHTHALMIC RELATED) IMPLANT
RING MALYGIN (MISCELLANEOUS) IMPLANT
SIGHTPATH CAT PROC W REG LENS (Ophthalmic Related) ×2 IMPLANT
SYRINGE LUER LOK 1CC (MISCELLANEOUS) ×1 IMPLANT
TAPE SURG TRANSPORE 1 IN (GAUZE/BANDAGES/DRESSINGS) IMPLANT
TAPE SURGICAL TRANSPORE 1 IN (GAUZE/BANDAGES/DRESSINGS) ×1
VISCOELASTIC ADDITIONAL (OPHTHALMIC RELATED) IMPLANT
WATER STERILE IRR 250ML POUR (IV SOLUTION) ×1 IMPLANT

## 2014-05-17 NOTE — H&P (Signed)
I have reviewed the H&P, the patient was re-examined, and I have identified no interval changes in medical condition and plan of care since the history and physical of record  

## 2014-05-17 NOTE — Op Note (Signed)
Date of Admission: 05/17/2014  Date of Surgery: 05/17/2014   Pre-Op Dx: Cataract Left Eye  Post-Op Dx: Senile Combined Cataract Left  Eye,  Dx Code [id number]  Surgeon: Tonny Branch, M.D.  Assistants: None  Anesthesia: Topical with MAC  Indications: Painless, progressive loss of vision with compromise of daily activities.  Surgery: Cataract Extraction with Intraocular lens Implant Left Eye  Discription: The patient had dilating drops and viscous lidocaine placed into the Left eye in the pre-op holding area. After transfer to the operating room, a time out was performed. The patient was then prepped and draped. Beginning with a 74 degree blade a paracentesis port was made at the surgeon's 2 o'clock position. The anterior chamber was then filled with 1% non-preserved lidocaine. This was followed by filling the anterior chamber with Provisc.  A 2.77mm keratome blade was used to make a clear corneal incision at the temporal limbus.  A bent cystatome needle was used to create a continuous tear capsulotomy. Hydrodissection was performed with balanced salt solution on a Fine canula. The lens nucleus was then removed using the phacoemulsification handpiece. Residual cortex was removed with the I&A handpiece. The anterior chamber and capsular bag were refilled with Provisc. A posterior chamber intraocular lens was placed into the capsular bag with it's injector. The implant was positioned with the Kuglan hook. The Provisc was then removed from the anterior chamber and capsular bag with the I&A handpiece. Stromal hydration of the main incision and paracentesis port was performed with BSS on a Fine canula. The wounds were tested for leak which was negative. The patient tolerated the procedure well. There were no operative complications. The patient was then transferred to the recovery room in stable condition.  Complications: None  Specimen: None  EBL: None  Prosthetic device: Hoya iSert 250, power 18.0 D,  SN [id number].

## 2014-05-17 NOTE — Anesthesia Postprocedure Evaluation (Signed)
  Anesthesia Post-op Note  Patient: Thomas [name]  Procedure(s) Performed: Procedure(s) with comments: CATARACT EXTRACTION PHACO AND INTRAOCULAR LENS PLACEMENT (IOC) (Left) - CDE:7.71  Patient Location: Short Stay  Anesthesia Type:MAC  Level of Consciousness: awake, alert , oriented and patient cooperative  Airway and Oxygen Therapy: Patient Spontanous Breathing  Post-op Pain: none  Post-op Assessment: Post-op Vital signs reviewed, Patient's Cardiovascular Status Stable, Respiratory Function Stable, RESPIRATORY FUNCTION UNSTABLE, No signs of Nausea or vomiting and Pain level controlled  Post-op Vital Signs: Reviewed and stable  Last Vitals:  Filed Vitals:   05/17/14 1245  BP: 144/70  Pulse:   Temp:   Resp: 17    Complications: No apparent anesthesia complications

## 2014-05-17 NOTE — Anesthesia Preprocedure Evaluation (Signed)
Anesthesia Evaluation  Patient identified by MRN, date of birth, ID band Patient awake    Reviewed: Allergy & Precautions, H&P , NPO status , Patient's Chart, lab work & pertinent test results, reviewed documented beta blocker date and time   History of Anesthesia Complications Negative for: history of anesthetic complications  Airway Mallampati: II  TM Distance: >3 FB Neck ROM: Full    Dental  (+) Edentulous Upper, Edentulous Lower   Pulmonary shortness of breath, neg sleep apnea, COPD COPD inhaler and oxygen dependent, Current Smoker,    Pulmonary exam normal       Cardiovascular Exercise Tolerance: Poor hypertension, Pt. on medications and Pt. 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   Neuro/Psych negative neurological ROS     GI/Hepatic Neg liver ROS, hiatal hernia, PUD, GERD-  Medicated and Controlled,  Endo/Other  diabetes, Well Controlled, Type 2, Insulin Dependent  Renal/GU CRF and DialysisRenal disease  negative genitourinary   Musculoskeletal negative musculoskeletal ROS (+)   Abdominal   Peds  Hematology  (+) anemia ,   Anesthesia Other Findings   Reproductive/Obstetrics                             Anesthesia Physical Anesthesia Plan  ASA: III  Anesthesia Plan: MAC   Post-op Pain Management:    Induction: Intravenous  Airway Management Planned: Nasal Cannula  Additional Equipment: None  Intra-op Plan:   Post-operative Plan:   Informed Consent: I have reviewed the patients History and Physical, chart, labs and discussed the procedure including the risks, benefits and alternatives for the proposed anesthesia with the patient or authorized representative who has indicated his/her understanding and acceptance.   Dental advisory given  Plan Discussed with: CRNA and  Surgeon  Anesthesia Plan Comments:         Anesthesia Quick Evaluation

## 2014-05-17 NOTE — Transfer of Care (Signed)
Immediate Anesthesia Transfer of Care Note  Patient: Thomas [name]  Procedure(s) Performed: Procedure(s) with comments: CATARACT EXTRACTION PHACO AND INTRAOCULAR LENS PLACEMENT (IOC) (Left) - CDE:7.71  Patient Location: Short Stay  Anesthesia Type:MAC  Level of Consciousness: awake, alert , oriented and patient cooperative  Airway & Oxygen Therapy: Patient Spontanous Breathing  Post-op Assessment: Report given to PACU RN and Post -op Vital signs reviewed and stable  Post vital signs: Reviewed and stable  Complications: No apparent anesthesia complications

## 2014-05-17 NOTE — Anesthesia Procedure Notes (Signed)
Procedure Name: MAC Date/Time: 05/17/2014 12:46 PM Performed by: Andree Elk, Kelton Bultman A Pre-anesthesia Checklist: Timeout performed, Patient identified, Emergency Drugs available, Suction available and Patient being monitored Oxygen Delivery Method: Nasal cannula

## 2014-05-17 NOTE — Discharge Instructions (Signed)

## 2014-05-19 ENCOUNTER — Encounter (HOSPITAL_COMMUNITY): Payer: Self-pay | Admitting: Ophthalmology

## 2014-06-11 ENCOUNTER — Encounter (HOSPITAL_COMMUNITY): Payer: Self-pay

## 2014-06-11 ENCOUNTER — Encounter (HOSPITAL_COMMUNITY)
Admission: RE | Admit: 2014-06-11 | Discharge: 2014-06-11 | Disposition: A | Discharge status: Home / Self Care | Payer: Medicare Other | Source: Ambulatory Visit | Attending: Ophthalmology | Admitting: Ophthalmology

## 2014-06-11 MED ORDER — TETRACAINE HCL 0.5 % OP SOLN
OPHTHALMIC | Status: AC
  Filled 2014-06-11: qty 2

## 2014-06-11 MED ORDER — PHENYLEPHRINE HCL 2.5 % OP SOLN
OPHTHALMIC | Status: AC
Start: 2014-06-11 — End: 2014-06-11
  Filled 2014-06-11: qty 15

## 2014-06-11 MED ORDER — NEOMYCIN-POLYMYXIN-DEXAMETH 3.5-10000-0.1 OP SUSP
OPHTHALMIC | Status: AC
  Filled 2014-06-11: qty 5

## 2014-06-11 MED ORDER — CYCLOPENTOLATE-PHENYLEPHRINE OP SOLN OPTIME - NO CHARGE
OPHTHALMIC | Status: AC
  Filled 2014-06-11: qty 2

## 2014-06-14 ENCOUNTER — Ambulatory Visit (HOSPITAL_COMMUNITY)
Admission: RE | Admit: 2014-06-14 | Discharge: 2014-06-14 | Disposition: A | Discharge status: Home / Self Care | Payer: Medicare Other | Source: Ambulatory Visit | Attending: Ophthalmology | Admitting: Ophthalmology

## 2014-06-14 ENCOUNTER — Encounter (HOSPITAL_COMMUNITY)
Admission: RE | Disposition: A | Discharge status: Home / Self Care | Payer: Self-pay | Source: Ambulatory Visit | Attending: Ophthalmology

## 2014-06-14 ENCOUNTER — Ambulatory Visit (HOSPITAL_COMMUNITY): Payer: Medicare Other | Admitting: Anesthesiology

## 2014-06-14 ENCOUNTER — Encounter (HOSPITAL_COMMUNITY): Payer: Self-pay | Admitting: Anesthesiology

## 2014-06-14 DIAGNOSIS — H25811 Combined forms of age-related cataract, right eye: Secondary | ICD-10-CM | POA: Insufficient documentation

## 2014-06-14 HISTORY — PX: CATARACT EXTRACTION W/PHACO: SHX586

## 2014-06-14 LAB — POCT I-STAT 4, (NA,K, GLUC, HGB,HCT)
Glucose, Bld: 150 mg/dL — ABNORMAL HIGH (ref 70–99)
HEMATOCRIT: 33 % — AB (ref 39.0–52.0)
Hemoglobin: 11.2 g/dL — ABNORMAL LOW (ref 13.0–17.0)
Potassium: 4.8 mmol/L (ref 3.5–5.1)
Sodium: 141 mmol/L (ref 135–145)

## 2014-06-14 SURGERY — PHACOEMULSIFICATION, CATARACT, WITH IOL INSERTION
Anesthesia: Monitor Anesthesia Care | Site: Eye | Laterality: Right

## 2014-06-14 MED ORDER — ONDANSETRON HCL 4 MG/2ML IJ SOLN: 4.0000 mg | Freq: Once | INTRAMUSCULAR | Status: DC | PRN

## 2014-06-14 MED ORDER — FENTANYL CITRATE 0.05 MG/ML IJ SOLN
25.0000 ug | INTRAMUSCULAR | Status: AC
  Administered 2014-06-14 (×2): 25 ug via INTRAVENOUS
  Filled 2014-06-14: qty 2

## 2014-06-14 MED ORDER — POVIDONE-IODINE 5 % OP SOLN
OPHTHALMIC | Status: DC | PRN
  Administered 2014-06-14: 1 via OPHTHALMIC

## 2014-06-14 MED ORDER — PROVISC 10 MG/ML IO SOLN
INTRAOCULAR | Status: DC | PRN
  Administered 2014-06-14: 0.85 mL via INTRAOCULAR

## 2014-06-14 MED ORDER — FENTANYL CITRATE 0.05 MG/ML IJ SOLN: 25.0000 ug | INTRAMUSCULAR | Status: DC | PRN

## 2014-06-14 MED ORDER — LIDOCAINE HCL 3.5 % OP GEL
1.0000 "application " | Freq: Once | OPHTHALMIC | Status: AC
  Administered 2014-06-14: 1 via OPHTHALMIC

## 2014-06-14 MED ORDER — MIDAZOLAM HCL 2 MG/2ML IJ SOLN
1.0000 mg | INTRAMUSCULAR | Status: DC | PRN
  Administered 2014-06-14: 2 mg via INTRAVENOUS
  Filled 2014-06-14: qty 2

## 2014-06-14 MED ORDER — SODIUM CHLORIDE 0.9 % IV SOLN
INTRAVENOUS | Status: DC | PRN
  Administered 2014-06-14: 11:00:00 via INTRAVENOUS

## 2014-06-14 MED ORDER — TETRACAINE HCL 0.5 % OP SOLN
1.0000 [drp] | OPHTHALMIC | Status: AC
  Administered 2014-06-14 (×3): 1 [drp] via OPHTHALMIC

## 2014-06-14 MED ORDER — EPINEPHRINE HCL 1 MG/ML IJ SOLN
INTRAMUSCULAR | Status: AC
  Filled 2014-06-14: qty 1

## 2014-06-14 MED ORDER — BSS IO SOLN
INTRAOCULAR | Status: DC | PRN
  Administered 2014-06-14: 15 mL via INTRAOCULAR

## 2014-06-14 MED ORDER — PHENYLEPHRINE HCL 2.5 % OP SOLN
1.0000 [drp] | OPHTHALMIC | Status: AC
  Administered 2014-06-14 (×3): 1 [drp] via OPHTHALMIC

## 2014-06-14 MED ORDER — CYCLOPENTOLATE-PHENYLEPHRINE 0.2-1 % OP SOLN
1.0000 [drp] | OPHTHALMIC | Status: AC
  Administered 2014-06-14 (×3): 1 [drp] via OPHTHALMIC

## 2014-06-14 MED ORDER — EPINEPHRINE HCL 1 MG/ML IJ SOLN
INTRAOCULAR | Status: DC | PRN
  Administered 2014-06-14: 12:00:00

## 2014-06-14 MED ORDER — LIDOCAINE HCL (PF) 1 % IJ SOLN
INTRAMUSCULAR | Status: DC | PRN
  Administered 2014-06-14: .5 mL

## 2014-06-14 SURGICAL SUPPLY — 33 items
CAPSULAR TENSION RING-AMO (OPHTHALMIC RELATED) IMPLANT
CLOTH BEACON ORANGE TIMEOUT ST (SAFETY) ×1 IMPLANT
EYE SHIELD UNIVERSAL CLEAR (GAUZE/BANDAGES/DRESSINGS) ×1 IMPLANT
GLOVE BIO SURGEON STRL SZ 6.5 (GLOVE) IMPLANT
GLOVE BIOGEL PI IND STRL 6.5 (GLOVE) IMPLANT
GLOVE BIOGEL PI IND STRL 7.0 (GLOVE) IMPLANT
GLOVE BIOGEL PI IND STRL 7.5 (GLOVE) IMPLANT
GLOVE BIOGEL PI INDICATOR 6.5 (GLOVE) ×1
GLOVE BIOGEL PI INDICATOR 7.0 (GLOVE)
GLOVE BIOGEL PI INDICATOR 7.5 (GLOVE)
GLOVE ECLIPSE 6.5 STRL STRAW (GLOVE) ×1 IMPLANT
GLOVE ECLIPSE 7.0 STRL STRAW (GLOVE) IMPLANT
GLOVE ECLIPSE 7.5 STRL STRAW (GLOVE) IMPLANT
GLOVE EXAM NITRILE LRG STRL (GLOVE) IMPLANT
GLOVE EXAM NITRILE MD LF STRL (GLOVE) IMPLANT
GLOVE SKINSENSE NS SZ6.5 (GLOVE)
GLOVE SKINSENSE NS SZ7.0 (GLOVE)
GLOVE SKINSENSE STRL SZ6.5 (GLOVE) IMPLANT
GLOVE SKINSENSE STRL SZ7.0 (GLOVE) IMPLANT
KIT VITRECTOMY (OPHTHALMIC RELATED) IMPLANT
PAD ARMBOARD 7.5X6 YLW CONV (MISCELLANEOUS) ×1 IMPLANT
PROC W NO LENS (INTRAOCULAR LENS)
PROC W SPEC LENS (INTRAOCULAR LENS)
PROCESS W NO LENS (INTRAOCULAR LENS) IMPLANT
PROCESS W SPEC LENS (INTRAOCULAR LENS) IMPLANT
RETRACTOR IRIS SIGHTPATH (OPHTHALMIC RELATED) IMPLANT
RING MALYGIN (MISCELLANEOUS) IMPLANT
SIGHTPATH CAT PROC W REG LENS (Ophthalmic Related) ×2 IMPLANT
SYRINGE LUER LOK 1CC (MISCELLANEOUS) ×1 IMPLANT
TAPE SURG TRANSPARENT 2IN (GAUZE/BANDAGES/DRESSINGS) IMPLANT
TAPE TRANSPARENT 2IN (GAUZE/BANDAGES/DRESSINGS) ×1
VISCOELASTIC ADDITIONAL (OPHTHALMIC RELATED) IMPLANT
WATER STERILE IRR 250ML POUR (IV SOLUTION) ×1 IMPLANT

## 2014-06-14 NOTE — Anesthesia Preprocedure Evaluation (Signed)
Anesthesia Evaluation  Patient identified by MRN, date of birth, ID band Patient awake    Reviewed: Allergy & Precautions, H&P , NPO status , Patient's Chart, lab work & pertinent test results, reviewed documented beta blocker date and time   History of Anesthesia Complications Negative for: history of anesthetic complications  Airway Mallampati: II  TM Distance: >3 FB Neck ROM: Full    Dental  (+) Edentulous Upper, Edentulous Lower   Pulmonary shortness of breath, neg sleep apnea, COPD COPD inhaler and oxygen dependent, Current Smoker,    Pulmonary exam normal       Cardiovascular Exercise Tolerance: Poor hypertension, Pt. on medications and Pt. 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   Neuro/Psych negative neurological ROS     GI/Hepatic Neg liver ROS, hiatal hernia, PUD, GERD-  Medicated and Controlled,  Endo/Other  diabetes, Well Controlled, Type 2, Insulin Dependent  Renal/GU CRF and DialysisRenal disease  negative genitourinary   Musculoskeletal negative musculoskeletal ROS (+)   Abdominal   Peds  Hematology  (+) anemia ,   Anesthesia Other Findings   Reproductive/Obstetrics                             Anesthesia Physical Anesthesia Plan  ASA: III  Anesthesia Plan: MAC   Post-op Pain Management:    Induction: Intravenous  Airway Management Planned: Nasal Cannula  Additional Equipment: None  Intra-op Plan:   Post-operative Plan:   Informed Consent: I have reviewed the patients History and Physical, chart, labs and discussed the procedure including the risks, benefits and alternatives for the proposed anesthesia with the patient or authorized representative who has indicated his/her understanding and acceptance.   Dental advisory given  Plan Discussed with: CRNA and  Surgeon  Anesthesia Plan Comments:         Anesthesia Quick Evaluation

## 2014-06-14 NOTE — Transfer of Care (Signed)
Immediate Anesthesia Transfer of Care Note  Patient: Thomas [name]  Procedure(s) Performed: Procedure(s) with comments: CATARACT EXTRACTION PHACO AND INTRAOCULAR LENS PLACEMENT RIGHT EYE (Right) - CDE:8.22  Patient Location: Short Stay  Anesthesia Type:MAC  Level of Consciousness: awake, alert  and oriented  Airway & Oxygen Therapy: Patient Spontanous Breathing  Post-op Assessment: Report given to PACU RN  Post vital signs: Reviewed  Complications: No apparent anesthesia complications

## 2014-06-14 NOTE — Discharge Instructions (Signed)

## 2014-06-14 NOTE — H&P (Signed)
I have reviewed the H&P, the patient was re-examined, and I have identified no interval changes in medical condition and plan of care since the history and physical of record  

## 2014-06-14 NOTE — Anesthesia Postprocedure Evaluation (Signed)
  Anesthesia Post-op Note  Patient: Thomas [name]  Procedure(s) Performed: Procedure(s) with comments: CATARACT EXTRACTION PHACO AND INTRAOCULAR LENS PLACEMENT RIGHT EYE (Right) - CDE:8.22  Patient Location: Short Stay  Anesthesia Type:MAC  Level of Consciousness: awake, alert  and oriented  Airway and Oxygen Therapy: Patient Spontanous Breathing  Post-op Pain: none  Post-op Assessment: Post-op Vital signs reviewed  Post-op Vital Signs: Reviewed and stable  Last Vitals:  Filed Vitals:   06/14/14 1150  BP: 128/63  Pulse:   Temp:   Resp: 13    Complications: No apparent anesthesia complications

## 2014-06-14 NOTE — Op Note (Signed)
Date of Admission: 06/14/2014  Date of Surgery: 06/14/2014   Pre-Op Dx: Cataract Right Eye  Post-Op Dx: Senile Combined Cataract Right  Eye,  Dx Code [id number]  Surgeon: Tonny Branch, M.D.  Assistants: None  Anesthesia: Topical with MAC  Indications: Painless, progressive loss of vision with compromise of daily activities.  Surgery: Cataract Extraction with Intraocular lens Implant Right Eye  Discription: The patient had dilating drops and viscous lidocaine placed into the Right eye in the pre-op holding area. After transfer to the operating room, a time out was performed. The patient was then prepped and draped. Beginning with a 35 degree blade a paracentesis port was made at the surgeon's 2 o'clock position. The anterior chamber was then filled with 1% non-preserved lidocaine. This was followed by filling the anterior chamber with Provisc.  A 2.23mm keratome blade was used to make a clear corneal incision at the temporal limbus.  A bent cystatome needle was used to create a continuous tear capsulotomy. Hydrodissection was performed with balanced salt solution on a Fine canula. The lens nucleus was then removed using the phacoemulsification handpiece. Residual cortex was removed with the I&A handpiece. The anterior chamber and capsular bag were refilled with Provisc. A posterior chamber intraocular lens was placed into the capsular bag with it's injector. The implant was positioned with the Kuglan hook. The Provisc was then removed from the anterior chamber and capsular bag with the I&A handpiece. Stromal hydration of the main incision and paracentesis port was performed with BSS on a Fine canula. The wounds were tested for leak which was negative. The patient tolerated the procedure well. There were no operative complications. The patient was then transferred to the recovery room in stable condition.  Complications: None  Specimen: None  EBL: None  Prosthetic device: Hoya iSert 250, power 17.0  D, SN [id number].

## 2014-06-15 ENCOUNTER — Encounter (HOSPITAL_COMMUNITY): Payer: Self-pay | Admitting: Ophthalmology

## 2014-06-15 MED ORDER — NEOMYCIN-POLYMYXIN-DEXAMETH 3.5-10000-0.1 OP SUSP
OPHTHALMIC | Status: DC | PRN
  Administered 2014-06-14: 2 [drp]

## 2015-06-23 ENCOUNTER — Ambulatory Visit (INDEPENDENT_AMBULATORY_CARE_PROVIDER_SITE_OTHER): Payer: Medicare Other | Admitting: Otolaryngology

## 2015-06-23 DIAGNOSIS — J340 Abscess, furuncle and carbuncle of nose: Secondary | ICD-10-CM | POA: Diagnosis not present

## 2015-06-30 ENCOUNTER — Ambulatory Visit (INDEPENDENT_AMBULATORY_CARE_PROVIDER_SITE_OTHER): Payer: Medicare Other | Admitting: Otolaryngology

## 2015-06-30 DIAGNOSIS — J340 Abscess, furuncle and carbuncle of nose: Secondary | ICD-10-CM

## 2015-07-07 ENCOUNTER — Ambulatory Visit (INDEPENDENT_AMBULATORY_CARE_PROVIDER_SITE_OTHER): Payer: Medicare Other | Admitting: Otolaryngology

## 2015-07-07 DIAGNOSIS — L03211 Cellulitis of face: Secondary | ICD-10-CM | POA: Diagnosis not present

## 2015-07-07 DIAGNOSIS — Z992 Dependence on renal dialysis: Secondary | ICD-10-CM | POA: Diagnosis not present

## 2015-07-07 DIAGNOSIS — D509 Iron deficiency anemia, unspecified: Secondary | ICD-10-CM | POA: Diagnosis not present

## 2015-07-07 DIAGNOSIS — N2581 Secondary hyperparathyroidism of renal origin: Secondary | ICD-10-CM | POA: Diagnosis not present

## 2015-07-07 DIAGNOSIS — D631 Anemia in chronic kidney disease: Secondary | ICD-10-CM | POA: Diagnosis not present

## 2015-07-07 DIAGNOSIS — N186 End stage renal disease: Secondary | ICD-10-CM | POA: Diagnosis not present

## 2015-07-14 ENCOUNTER — Ambulatory Visit (INDEPENDENT_AMBULATORY_CARE_PROVIDER_SITE_OTHER): Payer: Medicare Other | Admitting: Otolaryngology

## 2015-07-14 ENCOUNTER — Ambulatory Visit (HOSPITAL_COMMUNITY)
Admission: RE | Admit: 2015-07-14 | Discharge: 2015-07-14 | Disposition: A | Discharge status: Home / Self Care | Payer: Medicare Other | Source: Ambulatory Visit | Attending: Otolaryngology | Admitting: Otolaryngology

## 2015-07-14 ENCOUNTER — Other Ambulatory Visit: Payer: Self-pay | Admitting: Otolaryngology

## 2015-07-14 ENCOUNTER — Other Ambulatory Visit (INDEPENDENT_AMBULATORY_CARE_PROVIDER_SITE_OTHER): Payer: Self-pay | Admitting: Otolaryngology

## 2015-07-14 DIAGNOSIS — L0201 Cutaneous abscess of face: Secondary | ICD-10-CM

## 2015-07-14 DIAGNOSIS — R600 Localized edema: Secondary | ICD-10-CM | POA: Insufficient documentation

## 2015-07-14 DIAGNOSIS — R229 Localized swelling, mass and lump, unspecified: Secondary | ICD-10-CM | POA: Insufficient documentation

## 2015-07-14 DIAGNOSIS — R220 Localized swelling, mass and lump, head: Secondary | ICD-10-CM | POA: Diagnosis not present

## 2015-07-14 DIAGNOSIS — L03211 Cellulitis of face: Secondary | ICD-10-CM | POA: Diagnosis not present

## 2015-07-15 ENCOUNTER — Encounter (HOSPITAL_COMMUNITY): Payer: Self-pay | Admitting: *Deleted

## 2015-07-15 NOTE — Progress Notes (Signed)
Chart reviewed by Dr Al Corpus, due to patient's extensive medical history and being 02 dependent, he will be better served being done at [place].

## 2015-07-15 NOTE — Progress Notes (Signed)
Anesthesia Chart Review: SAME DAY WORK-UP.   Patient is a 75 year old male scheduled for excision of nasal mass on 07/18/2015 by Dr. Benjamine Mola. Case was initially scheduled at [facility], ansthesiologist Dr. Al Corpus recommended that case be done in the Main OR due to his medical history and O2 dependence.   History includes smoking, COPD, diabetes mellitus type 2, neuropathy, hypertension, hypercholesterolemia, CAD/MI (MI/arrest in the 90's s/p stent and MI '06 s/p 4 stents '06 at [facility]), erosive esophagitis, end-stage renal disease on hemodialysis (TTS, LUE AVF), IBS, anemia, lymphocytic colitis, cholecystectomy. He was not felt to be an acceptable candidate for renal transplant (due to age, cardiac history, COPD) per 05/23/15 evaluation at [facility] (initial evaluation 05/23/15). PT and strengthening recommended for three months and then he will be reconsidered.  Cardiologist is Dr. Rich Reining [phone]) with [facility], last visit 12/29/14 (Care Everywhere). Patient was without symptoms of angina, heart failure, arrhythmia. Records from [facility] requested but are pending. I also called Dr. Ursula Beath office. His nurse was gone for the afternoon but staff did look at least a few years back and did not see any records other than office notes and EKGs. One of our PAT RN's was finally able to get in touch with patient around 5 PM today and was told that he thinks his last stress/echo/cath was done in 2006/2007.   Meds listed include albuterol, Spiriva, pravastatin, oxycodone, Prilosec, nitroglycerin, Lopressor, mag oxide, losartan-HCTZ, Lantus, krill oil, Imdur, Lopid, Symbicort, Nephro-Vite, aspirin, amlodipine.  05/23/15 EKG Result Narrative (Care Everywhere): SR. Normal ECG. When compared with ECG of 11/25/13. (Actual tracing requested from [facility], but is still pending.)  05/23/15 CXR (Care Everywhere): FINDINGS:  . Cardiovascular: Cardiac silhouette and pulmonary vasculature are within normal limits.  . Mediastinum: Within  normal limits.  . Lungs/pleura: No acute pulmonary disease. Bibasilar scarring versus atelectasis. No pleural effusion or pneumothorax. There is some flattening of hemidiaphragms suggesting mild hyperaeration. Marland Kitchen Upper abdomen: Visualized portions are unremarkable.  . Osseous structures: No acute osseous abnormalities.  He is for labs on arrival.   Case is posted as general anesthesia. I called and spoke with Dr. Benjamine Mola to get a better sense about the operative procedure and anesthesia concerns regarding patient's extensive cardiac history, and at this point, inability to obtain much of his prior testing results. He said that patient has a nasal mass that was initially thought to be infection, however, it did not respond to antibiotic therapy. The mass is interfering with his home O2. Maxillofacial CT w/o contrast done yesterday showed a solid soft tissue density without evidence of mass. Differential includes infection and tumor. Dr. Benjamine Mola needs to biopsy the mass to get a definitive diagnosis. He thinks the procedure will take about 10 minutes and could be done with MAC anesthesia if needed. I discussed above with anesthesiologist Dr. Linna Caprice. Patient to be further evaluated on the day of surgery. He does have known heart disease, but if he has been able to tolerate hemodialysis three times a week without CV symptoms it is anticipated that he can tolerate nasal mass biopsy under MAC anesthesia; however, formal decision regarding anesthesia plan will need to be determined by his assigned anesthesiologist following evaluation Monday.   Thomas [name] [facility] Stay [facility]/Anesthesiology Phone [phone] 07/15/2015 6:43 PM

## 2015-07-15 NOTE — Progress Notes (Signed)
Pt denies SOB and chest pain but is under the care of Dr. Rich Reining, Cardiology at [facility]. Pt stated that he had his last cardiac cath and echo  in 2006 at [facility] and a stress test in 2007  ; records requested. Pt stated that he was instructed to stop taking Aspirin, last dose was 07/12/14. Pt made aware to take 16 units of Lantus Insulin or 80% of required dose according to BS ( as prescribed). Pt made aware of diabetes protocol, to check BS and interventions for  a BS < 70 and > 220. Pt made aware to stop otc vitamins, fish oil, krill oil, herbal medications and NSAID's. Pt verbalized understanding of all pre-op instructions. Anesthesia to review pt cardiac history ( see note).

## 2015-07-18 ENCOUNTER — Encounter (HOSPITAL_COMMUNITY)
Admission: RE | Disposition: A | Discharge status: Home / Self Care | Payer: Self-pay | Source: Ambulatory Visit | Attending: Otolaryngology

## 2015-07-18 ENCOUNTER — Ambulatory Visit (HOSPITAL_COMMUNITY): Payer: Medicare Other | Admitting: Emergency Medicine

## 2015-07-18 ENCOUNTER — Ambulatory Visit (HOSPITAL_COMMUNITY)
Admission: RE | Admit: 2015-07-18 | Discharge: 2015-07-18 | Disposition: A | Discharge status: Home / Self Care | Payer: Medicare Other | Source: Ambulatory Visit | Attending: Otolaryngology | Admitting: Otolaryngology

## 2015-07-18 ENCOUNTER — Encounter (HOSPITAL_COMMUNITY): Payer: Self-pay | Admitting: Certified Registered Nurse Anesthetist

## 2015-07-18 DIAGNOSIS — C300 Malignant neoplasm of nasal cavity: Secondary | ICD-10-CM | POA: Diagnosis not present

## 2015-07-18 DIAGNOSIS — K219 Gastro-esophageal reflux disease without esophagitis: Secondary | ICD-10-CM | POA: Diagnosis not present

## 2015-07-18 DIAGNOSIS — J449 Chronic obstructive pulmonary disease, unspecified: Secondary | ICD-10-CM | POA: Diagnosis not present

## 2015-07-18 DIAGNOSIS — N186 End stage renal disease: Secondary | ICD-10-CM | POA: Insufficient documentation

## 2015-07-18 DIAGNOSIS — E1122 Type 2 diabetes mellitus with diabetic chronic kidney disease: Secondary | ICD-10-CM | POA: Diagnosis not present

## 2015-07-18 DIAGNOSIS — E1140 Type 2 diabetes mellitus with diabetic neuropathy, unspecified: Secondary | ICD-10-CM | POA: Insufficient documentation

## 2015-07-18 DIAGNOSIS — I2510 Atherosclerotic heart disease of native coronary artery without angina pectoris: Secondary | ICD-10-CM | POA: Diagnosis not present

## 2015-07-18 DIAGNOSIS — R220 Localized swelling, mass and lump, head: Secondary | ICD-10-CM | POA: Insufficient documentation

## 2015-07-18 DIAGNOSIS — C490 Malignant neoplasm of connective and soft tissue of head, face and neck: Secondary | ICD-10-CM | POA: Diagnosis not present

## 2015-07-18 DIAGNOSIS — I252 Old myocardial infarction: Secondary | ICD-10-CM | POA: Insufficient documentation

## 2015-07-18 DIAGNOSIS — Z992 Dependence on renal dialysis: Secondary | ICD-10-CM | POA: Diagnosis not present

## 2015-07-18 DIAGNOSIS — R0602 Shortness of breath: Secondary | ICD-10-CM | POA: Diagnosis not present

## 2015-07-18 DIAGNOSIS — I120 Hypertensive chronic kidney disease with stage 5 chronic kidney disease or end stage renal disease: Secondary | ICD-10-CM | POA: Insufficient documentation

## 2015-07-18 DIAGNOSIS — E1029 Type 1 diabetes mellitus with other diabetic kidney complication: Secondary | ICD-10-CM | POA: Diagnosis not present

## 2015-07-18 DIAGNOSIS — Z419 Encounter for procedure for purposes other than remedying health state, unspecified: Secondary | ICD-10-CM

## 2015-07-18 DIAGNOSIS — J3489 Other specified disorders of nose and nasal sinuses: Secondary | ICD-10-CM | POA: Diagnosis not present

## 2015-07-18 HISTORY — DX: Other specified disorders of nose and nasal sinuses: J34.89

## 2015-07-18 HISTORY — DX: Heart failure, unspecified: I50.9

## 2015-07-18 HISTORY — PX: EXCISION NASAL MASS: SHX6271

## 2015-07-18 LAB — CBC
HEMATOCRIT: 32.4 % — AB (ref 39.0–52.0)
Hemoglobin: 10.6 g/dL — ABNORMAL LOW (ref 13.0–17.0)
MCH: 29 pg (ref 26.0–34.0)
MCHC: 32.7 g/dL (ref 30.0–36.0)
MCV: 88.5 fL (ref 78.0–100.0)
Platelets: 209 10*3/uL (ref 150–400)
RBC: 3.66 MIL/uL — ABNORMAL LOW (ref 4.22–5.81)
RDW: 13.9 % (ref 11.5–15.5)
WBC: 8.2 10*3/uL (ref 4.0–10.5)

## 2015-07-18 LAB — BASIC METABOLIC PANEL
Anion gap: 17 — ABNORMAL HIGH (ref 5–15)
BUN: 58 mg/dL — ABNORMAL HIGH (ref 6–20)
CHLORIDE: 100 mmol/L — AB (ref 101–111)
CO2: 23 mmol/L (ref 22–32)
Calcium: 8.9 mg/dL (ref 8.9–10.3)
Creatinine, Ser: 6.29 mg/dL — ABNORMAL HIGH (ref 0.61–1.24)
GFR calc Af Amer: 9 mL/min — ABNORMAL LOW (ref >60)
GFR calc non Af Amer: 8 mL/min — ABNORMAL LOW (ref >60)
Glucose, Bld: 182 mg/dL — ABNORMAL HIGH (ref 65–99)
POTASSIUM: 5.1 mmol/L (ref 3.5–5.1)
SODIUM: 140 mmol/L (ref 135–145)

## 2015-07-18 LAB — GLUCOSE, CAPILLARY
GLUCOSE-CAPILLARY: 166 mg/dL — AB (ref 65–99)
Glucose-Capillary: 169 mg/dL — ABNORMAL HIGH (ref 65–99)

## 2015-07-18 SURGERY — EXCISION, MASS, NOSE
Anesthesia: General

## 2015-07-18 MED ORDER — ROCURONIUM BROMIDE 50 MG/5ML IV SOLN
INTRAVENOUS | Status: AC
  Filled 2015-07-18: qty 1

## 2015-07-18 MED ORDER — ONDANSETRON HCL 4 MG/2ML IJ SOLN: 4.0000 mg | Freq: Once | INTRAMUSCULAR | Status: DC | PRN

## 2015-07-18 MED ORDER — MIDAZOLAM HCL 5 MG/5ML IJ SOLN
INTRAMUSCULAR | Status: DC | PRN
  Administered 2015-07-18: 1 mg via INTRAVENOUS

## 2015-07-18 MED ORDER — PHENYLEPHRINE 40 MCG/ML (10ML) SYRINGE FOR IV PUSH (FOR BLOOD PRESSURE SUPPORT)
PREFILLED_SYRINGE | INTRAVENOUS | Status: AC
  Filled 2015-07-18: qty 10

## 2015-07-18 MED ORDER — CLINDAMYCIN PHOSPHATE 900 MG/50ML IV SOLN
INTRAVENOUS | Status: AC
  Administered 2015-07-18: 900 mg via INTRAVENOUS
  Filled 2015-07-18: qty 50

## 2015-07-18 MED ORDER — PROPOFOL 10 MG/ML IV BOLUS
INTRAVENOUS | Status: AC
  Filled 2015-07-18: qty 20

## 2015-07-18 MED ORDER — ONDANSETRON HCL 4 MG/2ML IJ SOLN
INTRAMUSCULAR | Status: DC | PRN
  Administered 2015-07-18: 4 mg via INTRAVENOUS

## 2015-07-18 MED ORDER — OXYMETAZOLINE HCL 0.05 % NA SOLN
NASAL | Status: DC | PRN
  Administered 2015-07-18: 3 via NASAL

## 2015-07-18 MED ORDER — SODIUM CHLORIDE 0.9 % IV SOLN
INTRAVENOUS | Status: DC
  Administered 2015-07-18: 09:00:00 via INTRAVENOUS

## 2015-07-18 MED ORDER — BACITRACIN-NEOMYCIN-POLYMYXIN OINTMENT TUBE
TOPICAL_OINTMENT | CUTANEOUS | Status: DC | PRN
  Administered 2015-07-18: 1 via TOPICAL

## 2015-07-18 MED ORDER — ONDANSETRON HCL 4 MG/2ML IJ SOLN
INTRAMUSCULAR | Status: AC
  Filled 2015-07-18: qty 2

## 2015-07-18 MED ORDER — LIDOCAINE-EPINEPHRINE 1 %-1:100000 IJ SOLN
INTRAMUSCULAR | Status: AC
  Filled 2015-07-18: qty 1

## 2015-07-18 MED ORDER — FENTANYL CITRATE (PF) 100 MCG/2ML IJ SOLN: 25.0000 ug | INTRAMUSCULAR | Status: DC | PRN

## 2015-07-18 MED ORDER — LIDOCAINE HCL (CARDIAC) 20 MG/ML IV SOLN
INTRAVENOUS | Status: AC
  Filled 2015-07-18: qty 5

## 2015-07-18 MED ORDER — 0.9 % SODIUM CHLORIDE (POUR BTL) OPTIME
TOPICAL | Status: DC | PRN
  Administered 2015-07-18: 1000 mL

## 2015-07-18 MED ORDER — PHENYLEPHRINE HCL 10 MG/ML IJ SOLN
10.0000 mg | INTRAVENOUS | Status: DC | PRN
  Administered 2015-07-18: 10 ug/min via INTRAVENOUS

## 2015-07-18 MED ORDER — EPINEPHRINE HCL 1 MG/ML IJ SOLN
INTRAMUSCULAR | Status: AC
  Filled 2015-07-18: qty 4

## 2015-07-18 MED ORDER — PROPOFOL 10 MG/ML IV BOLUS
INTRAVENOUS | Status: DC | PRN
  Administered 2015-07-18: 100 mg via INTRAVENOUS

## 2015-07-18 MED ORDER — OXYMETAZOLINE HCL 0.05 % NA SOLN
NASAL | Status: DC | PRN
  Administered 2015-07-18: 1 via TOPICAL

## 2015-07-18 MED ORDER — SODIUM CHLORIDE 0.9 % IV SOLN
INTRAVENOUS | Status: DC | PRN
  Administered 2015-07-18: 09:00:00 via INTRAVENOUS

## 2015-07-18 MED ORDER — FENTANYL CITRATE (PF) 100 MCG/2ML IJ SOLN
INTRAMUSCULAR | Status: DC | PRN
  Administered 2015-07-18 (×2): 25 ug via INTRAVENOUS

## 2015-07-18 MED ORDER — BACITRACIN ZINC 500 UNIT/GM EX OINT
TOPICAL_OINTMENT | CUTANEOUS | Status: AC
  Filled 2015-07-18: qty 28.35

## 2015-07-18 MED ORDER — LIDOCAINE HCL (CARDIAC) 20 MG/ML IV SOLN
INTRAVENOUS | Status: DC | PRN
  Administered 2015-07-18: 100 mg via INTRATRACHEAL

## 2015-07-18 MED ORDER — SUCCINYLCHOLINE CHLORIDE 20 MG/ML IJ SOLN
INTRAMUSCULAR | Status: DC | PRN
  Administered 2015-07-18: 100 mg via INTRAVENOUS

## 2015-07-18 MED ORDER — OXYCODONE-ACETAMINOPHEN 5-325 MG PO TABS: 1.0000 | ORAL_TABLET | ORAL | Status: DC | PRN

## 2015-07-18 MED ORDER — PHENYLEPHRINE HCL 10 MG/ML IJ SOLN
INTRAMUSCULAR | Status: DC | PRN
  Administered 2015-07-18: 80 ug via INTRAVENOUS

## 2015-07-18 MED ORDER — MIDAZOLAM HCL 2 MG/2ML IJ SOLN
INTRAMUSCULAR | Status: AC
  Filled 2015-07-18: qty 2

## 2015-07-18 MED ORDER — FENTANYL CITRATE (PF) 250 MCG/5ML IJ SOLN
INTRAMUSCULAR | Status: AC
  Filled 2015-07-18: qty 5

## 2015-07-18 MED ORDER — OXYMETAZOLINE HCL 0.05 % NA SOLN
NASAL | Status: AC
  Filled 2015-07-18: qty 15

## 2015-07-18 SURGICAL SUPPLY — 30 items
BLADE 10 SAFETY STRL DISP (BLADE) ×2 IMPLANT
BLADE SURG 15 STRL LF DISP TIS (BLADE) IMPLANT
BLADE SURG 15 STRL SS (BLADE)
CANISTER SUCTION 2500CC (MISCELLANEOUS) ×2 IMPLANT
COAGULATOR SUCT SWTCH 10FR 6 (ELECTROSURGICAL) IMPLANT
CONT SPEC 4OZ CLIKSEAL STRL BL (MISCELLANEOUS) ×2 IMPLANT
COVER SURGICAL LIGHT HANDLE (MISCELLANEOUS) ×1 IMPLANT
DRAPE PROXIMA HALF (DRAPES) IMPLANT
ELECT REM PT RETURN 9FT ADLT (ELECTROSURGICAL)
ELECTRODE REM PT RTRN 9FT ADLT (ELECTROSURGICAL) IMPLANT
GAUZE SPONGE 2X2 8PLY STRL LF (GAUZE/BANDAGES/DRESSINGS) ×1 IMPLANT
GLOVE BIOGEL PI IND STRL 6.5 (GLOVE) IMPLANT
GLOVE BIOGEL PI INDICATOR 6.5 (GLOVE) ×2
GLOVE ECLIPSE 6.5 STRL STRAW (GLOVE) ×1 IMPLANT
GLOVE ECLIPSE 7.5 STRL STRAW (GLOVE) ×2 IMPLANT
GOWN STRL REUS W/ TWL LRG LVL3 (GOWN DISPOSABLE) ×2 IMPLANT
GOWN STRL REUS W/TWL LRG LVL3 (GOWN DISPOSABLE) ×4
KIT BASIN OR (CUSTOM PROCEDURE TRAY) ×2 IMPLANT
KIT ROOM TURNOVER OR (KITS) ×2 IMPLANT
NDL HYPO 25GX1X1/2 BEV (NEEDLE) IMPLANT
NEEDLE HYPO 25GX1X1/2 BEV (NEEDLE) IMPLANT
NS IRRIG 1000ML POUR BTL (IV SOLUTION) ×2 IMPLANT
PAD ARMBOARD 7.5X6 YLW CONV (MISCELLANEOUS) ×4 IMPLANT
SPONGE GAUZE 2X2 STER 10/PKG (GAUZE/BANDAGES/DRESSINGS) ×1
SPONGE NEURO XRAY DETECT 1X3 (DISPOSABLE) ×2 IMPLANT
SUT CHROMIC 4 0 PS 2 18 (SUTURE) ×1 IMPLANT
TOWEL OR 17X24 6PK STRL BLUE (TOWEL DISPOSABLE) ×2 IMPLANT
TRAY ENT MC OR (CUSTOM PROCEDURE TRAY) ×2 IMPLANT
TUBE SALEM SUMP 16 FR W/ARV (TUBING) ×2 IMPLANT
WATER STERILE IRR 1000ML POUR (IV SOLUTION) ×2 IMPLANT

## 2015-07-18 NOTE — Op Note (Signed)
DATE OF PROCEDURE:  07/18/2015                              OPERATIVE REPORT  SURGEON:  Leta Baptist, MD  PREOPERATIVE DIAGNOSES: 1. Nasal septal mass  POSTOPERATIVE DIAGNOSES: 1. Nasal septal mass  PROCEDURE PERFORMED:  Open biopsy of a large nasal septal mass  ANESTHESIA:  General endotracheal tube anesthesia.  COMPLICATIONS:  None.  ESTIMATED BLOOD LOSS:  Minimal.  INDICATION FOR PROCEDURE:  Thomas [name] is a 75 y.o. male with a two-month history of nasal swelling and redness. He was treated with multiple courses of antibiotics. He also underwent incision and drainage to treat the presumed abscess. His nasal redness has improved. However he continued to have a swollen nasal tip and septum. On his facial CT scan, a 3 cm solid soft tissue mass was noted at the nasal tip and the anterior nasal septum. Based on the above findings, the decision was made for patient to undergo open biopsy of the nasal mass under general anesthesia. The risks, benefits, alternatives, and details of the procedure were discussed with the patient.  Questions were invited and answered.  Informed consent was obtained.  DESCRIPTION:  The patient was taken to the operating room and placed supine on the operating table.  General endotracheal tube anesthesia was administered by the anesthesiologist.  The patient was positioned and prepped and draped in a standard fashion for nasal surgery. Pledgets soaked with Afrin were placed in both nasal cavities. The pledgets were subsequently removed. Examination of the patient's nose revealed a large 3 cm mass within the nasal tip, extending down the anterior nasal septum to the premaxilla area. A standard hemitransfixion incision was made on the left side. The incision was carried down to the premaxilla. The entire nasal septum was noted to be replaced by a soft tissue mass. Several biopsy specimens were obtained. The specimens were sent to the pathology department for frozen section  analysis.  A separate marginal incision was made on the right side. The entire nasal tip was noted to be replaced by a solid soft tissue mass. Multiple biopsy specimens were also obtained.  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 The specimens were reviewed with the pathologist.  The incisions were closed with 4-0 chromic sutures. The care of the patient was turned over to the anesthesiologist.  The patient was awakened from anesthesia without difficulty.  He was extubated and transferred to the recovery room in good condition.  OPERATIVE FINDINGS: 3 cm nasal septal mass  SPECIMEN: Biopsy specimens from the nasal septal mass  FOLLOWUP CARE:  The patient will be discharged home once awake and alert.  Tylenol with oxycodone can be taken on a p.r.n. basis for pain control.  The patient will follow up in my office in approximately 1 week.  Ascencion Dike 07/18/2015 1:02 PM

## 2015-07-18 NOTE — Anesthesia Preprocedure Evaluation (Addendum)
Anesthesia Evaluation    Airway Mallampati: III  TM Distance: >3 FB Neck ROM: Full    Dental  (+) Edentulous Upper   Pulmonary sleep apnea , COPD,  oxygen dependent, Current Smoker,    breath sounds clear to auscultation       Cardiovascular hypertension, Pt. on medications + CAD, + Past MI, + Cardiac Stents and +CHF   Rhythm:Regular Rate:Normal     Neuro/Psych    GI/Hepatic GERD  ,  Endo/Other  diabetes, Poorly Controlled, Type 2  Renal/GU ESRFRenal disease     Musculoskeletal  (+) Arthritis ,   Abdominal   Peds  Hematology   Anesthesia Other Findings Had dialysis on Saturday,, usually has Tuesday, Thursday, Saturday dialysis,, day of surgery labs pending  Denies angina or signs of worsening heart failure, he does tolerate dialysis well from cardiopulmonary standpoint he says  Reproductive/Obstetrics                            Anesthesia Physical Anesthesia Plan  ASA: III  Anesthesia Plan: General   Post-op Pain Management:    Induction: Intravenous  Airway Management Planned: LMA  Additional Equipment:   Intra-op Plan:   Post-operative Plan:   Informed Consent: I have reviewed the patients History and Physical, chart, labs and discussed the procedure including the risks, benefits and alternatives for the proposed anesthesia with the patient or authorized representative who has indicated his/her understanding and acceptance.   Dental advisory given  Plan Discussed with: CRNA and Anesthesiologist  Anesthesia Plan Comments: (Has been compliant with all heart failure medications, BB taken day of surgery, tolerating dialysis as needed and is on schedule)       Anesthesia Quick Evaluation

## 2015-07-18 NOTE — Anesthesia Procedure Notes (Signed)
Procedure Name: Intubation Date/Time: 07/18/2015 11:48 AM Performed by: Garrison Columbus T Pre-anesthesia Checklist: Patient identified, Emergency Drugs available, Suction available and Patient being monitored Patient Re-evaluated:Patient Re-evaluated prior to inductionOxygen Delivery Method: Circle system utilized Preoxygenation: Pre-oxygenation with 100% oxygen Intubation Type: IV induction Ventilation: Mask ventilation without difficulty and Oral airway inserted - appropriate to patient size Laryngoscope Size: Sabra Heck and 2 Grade View: Grade II Tube type: Oral Tube size: 7.5 mm Number of attempts: 1 Airway Equipment and Method: Stylet,  Oral airway and LTA kit utilized Placement Confirmation: ETT inserted through vocal cords under direct vision,  positive ETCO2 and breath sounds checked- equal and bilateral Secured at: 23 cm Tube secured with: Tape Dental Injury: Teeth and Oropharynx as per pre-operative assessment

## 2015-07-18 NOTE — H&P (Signed)
Cc: Nasal abscess/mass  HPI: The patient is a 75 y/o male who returns today for follow up evaluation of nasal swelling and redness. He was last seen 1 week ago. At that time, he was noted to have improvement in his nasal edema and erythema. However, a persistent area of induration was noted. He underwent repeat I&D and clindamycin po QID was added. The patient was continued on Bactroban ointment. The patient returns today noting a persistent swelling of his nasal tip. A CT was performed. The CT shows a solid soft tissue mass at the nasal tip.  No other ENT, GI, or respiratory issue noted since the last visit.   Exam General: Communicates without difficulty, well nourished, no acute distress. Head: Normocephalic, no evidence injury, no tenderness, facial buttresses intact without stepoff. Eyes: PERRL, EOMI. No scleral icterus, conjunctivae clear. Neuro: CN II exam reveals vision grossly intact. No nystagmus at any point of gaze. Ears: Auricles well formed without lesions. Ear canals are intact without mass or lesion. No erythema or edema is appreciated. The TMs are intact without fluid. Nose: External evaluation reveals improvement in his nasal swelling and erythema. Septum slightly enlarged. Oral:  Oral cavity and oropharynx are intact, symmetric, without erythema or edema. Mucosa is moist without lesions. Neck: Full range of motion without pain. There is no significant lymphadenopathy. No masses palpable. Thyroid bed within normal limits to palpation. Parotid glands and submandibular glands equal bilaterally without mass. Trachea is midline. Neuro:  CN 2-12 grossly intact. Gait normal. Vestibular: No nystagmus at any point of gaze. The cerebellar examination is unremarkable.   Assessment Nasal septal edema/ infection has improved. However, he is noted to have a 3 cm soft tissue mass.  Plan  1. The physical exam findings are reviewed with the patient. 2. Continue Bactroban ointment to external nose and  both nostrils bid. 4. Complete the clindamycin. 5. Plan open biopsy of the soft tissue mass in the operating room.

## 2015-07-18 NOTE — Anesthesia Postprocedure Evaluation (Signed)
Anesthesia Post Note  Patient: Thomas [name]  Procedure(s) Performed: Procedure(s) (LRB): EXCISION OF NASAL MASS (N/A)  Patient location during evaluation: PACU Anesthesia Type: General Level of consciousness: awake and alert Pain management: pain level controlled Vital Signs Assessment: post-procedure vital signs reviewed and stable Respiratory status: spontaneous breathing, nonlabored ventilation, respiratory function stable and patient connected to nasal cannula oxygen Cardiovascular status: blood pressure returned to baseline and stable Postop Assessment: no signs of nausea or vomiting Anesthetic complications: no    Last Vitals:  Filed Vitals:   07/18/15 1241 07/18/15 1300  BP: 161/73 162/74  Pulse:  72  Temp: 36.1 C   Resp: 19 14    Last Pain:  Filed Vitals:   07/18/15 1300  PainSc: Asleep                 Zenaida Deed

## 2015-07-18 NOTE — Transfer of Care (Signed)
Immediate Anesthesia Transfer of Care Note  Patient: Thomas [name]  Procedure(s) Performed: Procedure(s): EXCISION OF NASAL MASS (N/A)  Patient Location: PACU  Anesthesia Type:General  Level of Consciousness: awake and alert   Airway & Oxygen Therapy: Patient Spontanous Breathing and Patient connected to face mask oxygen  Post-op Assessment: Report given to RN, Post -op Vital signs reviewed and stable and Patient moving all extremities X 4  Post vital signs: Reviewed and stable  Last Vitals:  Filed Vitals:   07/18/15 0807  BP: 170/71  Pulse: 70  Temp: 36.6 C  Resp: 16    Complications: No apparent anesthesia complications

## 2015-07-18 NOTE — Discharge Instructions (Signed)
The patient may resume all his previous activities and diet. He will follow-up in my [place] office on Thursday.

## 2015-07-19 ENCOUNTER — Encounter (HOSPITAL_COMMUNITY): Payer: Self-pay | Admitting: Otolaryngology

## 2015-07-19 LAB — HEMOGLOBIN A1C
HEMOGLOBIN A1C: 7.9 % — AB (ref 4.8–5.6)
Mean Plasma Glucose: 180 mg/dL

## 2015-07-20 ENCOUNTER — Encounter (HOSPITAL_COMMUNITY): Payer: Self-pay | Admitting: Lab

## 2015-07-20 NOTE — Progress Notes (Signed)
Referral sent to [facility] / Dr Owens Shark [phone]).  Appt March 1 @1030 . Patient aware of appt.

## 2015-07-21 ENCOUNTER — Ambulatory Visit (INDEPENDENT_AMBULATORY_CARE_PROVIDER_SITE_OTHER): Payer: Medicare Other | Admitting: Otolaryngology

## 2015-07-26 DIAGNOSIS — E1122 Type 2 diabetes mellitus with diabetic chronic kidney disease: Secondary | ICD-10-CM | POA: Diagnosis not present

## 2015-07-26 DIAGNOSIS — J449 Chronic obstructive pulmonary disease, unspecified: Secondary | ICD-10-CM | POA: Diagnosis not present

## 2015-07-26 DIAGNOSIS — Z992 Dependence on renal dialysis: Secondary | ICD-10-CM | POA: Diagnosis not present

## 2015-07-26 DIAGNOSIS — I120 Hypertensive chronic kidney disease with stage 5 chronic kidney disease or end stage renal disease: Secondary | ICD-10-CM | POA: Diagnosis not present

## 2015-07-26 DIAGNOSIS — I2510 Atherosclerotic heart disease of native coronary artery without angina pectoris: Secondary | ICD-10-CM | POA: Diagnosis not present

## 2015-07-26 DIAGNOSIS — I252 Old myocardial infarction: Secondary | ICD-10-CM | POA: Diagnosis not present

## 2015-07-26 DIAGNOSIS — F17210 Nicotine dependence, cigarettes, uncomplicated: Secondary | ICD-10-CM | POA: Diagnosis not present

## 2015-07-26 DIAGNOSIS — C300 Malignant neoplasm of nasal cavity: Secondary | ICD-10-CM | POA: Diagnosis not present

## 2015-07-26 DIAGNOSIS — C319 Malignant neoplasm of accessory sinus, unspecified: Secondary | ICD-10-CM | POA: Diagnosis not present

## 2015-07-26 DIAGNOSIS — Z794 Long term (current) use of insulin: Secondary | ICD-10-CM | POA: Diagnosis not present

## 2015-07-27 DIAGNOSIS — C300 Malignant neoplasm of nasal cavity: Secondary | ICD-10-CM | POA: Diagnosis not present

## 2015-07-27 DIAGNOSIS — C760 Malignant neoplasm of head, face and neck: Secondary | ICD-10-CM | POA: Diagnosis not present

## 2015-08-02 DIAGNOSIS — Z992 Dependence on renal dialysis: Secondary | ICD-10-CM | POA: Diagnosis not present

## 2015-08-02 DIAGNOSIS — N186 End stage renal disease: Secondary | ICD-10-CM | POA: Diagnosis not present

## 2015-08-05 DIAGNOSIS — C300 Malignant neoplasm of nasal cavity: Secondary | ICD-10-CM | POA: Diagnosis not present

## 2015-08-05 DIAGNOSIS — Z01818 Encounter for other preprocedural examination: Secondary | ICD-10-CM | POA: Diagnosis not present

## 2015-08-05 DIAGNOSIS — C801 Malignant (primary) neoplasm, unspecified: Secondary | ICD-10-CM | POA: Diagnosis not present

## 2015-08-05 DIAGNOSIS — C319 Malignant neoplasm of accessory sinus, unspecified: Secondary | ICD-10-CM | POA: Diagnosis not present

## 2015-08-05 DIAGNOSIS — R911 Solitary pulmonary nodule: Secondary | ICD-10-CM | POA: Diagnosis not present

## 2015-08-06 DIAGNOSIS — N186 End stage renal disease: Secondary | ICD-10-CM | POA: Diagnosis not present

## 2015-08-06 DIAGNOSIS — D509 Iron deficiency anemia, unspecified: Secondary | ICD-10-CM | POA: Diagnosis not present

## 2015-08-06 DIAGNOSIS — Z992 Dependence on renal dialysis: Secondary | ICD-10-CM | POA: Diagnosis not present

## 2015-08-06 DIAGNOSIS — N2581 Secondary hyperparathyroidism of renal origin: Secondary | ICD-10-CM | POA: Diagnosis not present

## 2015-08-30 DIAGNOSIS — E119 Type 2 diabetes mellitus without complications: Secondary | ICD-10-CM | POA: Diagnosis not present

## 2015-08-30 DIAGNOSIS — N186 End stage renal disease: Secondary | ICD-10-CM | POA: Diagnosis not present

## 2015-08-30 DIAGNOSIS — Z794 Long term (current) use of insulin: Secondary | ICD-10-CM | POA: Diagnosis not present

## 2015-08-30 DIAGNOSIS — Z992 Dependence on renal dialysis: Secondary | ICD-10-CM | POA: Diagnosis not present

## 2015-08-30 DIAGNOSIS — N2581 Secondary hyperparathyroidism of renal origin: Secondary | ICD-10-CM | POA: Diagnosis not present

## 2015-08-30 DIAGNOSIS — D509 Iron deficiency anemia, unspecified: Secondary | ICD-10-CM | POA: Diagnosis not present

## 2015-09-02 DIAGNOSIS — I2510 Atherosclerotic heart disease of native coronary artery without angina pectoris: Secondary | ICD-10-CM | POA: Diagnosis not present

## 2015-09-02 DIAGNOSIS — I120 Hypertensive chronic kidney disease with stage 5 chronic kidney disease or end stage renal disease: Secondary | ICD-10-CM | POA: Diagnosis not present

## 2015-09-02 DIAGNOSIS — I259 Chronic ischemic heart disease, unspecified: Secondary | ICD-10-CM | POA: Diagnosis not present

## 2015-09-02 DIAGNOSIS — E1122 Type 2 diabetes mellitus with diabetic chronic kidney disease: Secondary | ICD-10-CM | POA: Diagnosis not present

## 2015-09-02 DIAGNOSIS — D62 Acute posthemorrhagic anemia: Secondary | ICD-10-CM | POA: Diagnosis not present

## 2015-09-02 DIAGNOSIS — I252 Old myocardial infarction: Secondary | ICD-10-CM | POA: Diagnosis not present

## 2015-09-02 DIAGNOSIS — R0602 Shortness of breath: Secondary | ICD-10-CM | POA: Diagnosis not present

## 2015-09-02 DIAGNOSIS — I10 Essential (primary) hypertension: Secondary | ICD-10-CM | POA: Diagnosis not present

## 2015-09-02 DIAGNOSIS — J449 Chronic obstructive pulmonary disease, unspecified: Secondary | ICD-10-CM | POA: Diagnosis not present

## 2015-09-02 DIAGNOSIS — C300 Malignant neoplasm of nasal cavity: Secondary | ICD-10-CM | POA: Diagnosis not present

## 2015-09-02 DIAGNOSIS — C319 Malignant neoplasm of accessory sinus, unspecified: Secondary | ICD-10-CM | POA: Diagnosis not present

## 2015-09-02 DIAGNOSIS — Z992 Dependence on renal dialysis: Secondary | ICD-10-CM | POA: Diagnosis not present

## 2015-09-02 DIAGNOSIS — C7839 Secondary malignant neoplasm of other respiratory organs: Secondary | ICD-10-CM | POA: Diagnosis not present

## 2015-09-02 DIAGNOSIS — N186 End stage renal disease: Secondary | ICD-10-CM | POA: Diagnosis not present

## 2015-09-04 DIAGNOSIS — Z992 Dependence on renal dialysis: Secondary | ICD-10-CM | POA: Diagnosis not present

## 2015-09-04 DIAGNOSIS — N186 End stage renal disease: Secondary | ICD-10-CM | POA: Diagnosis not present

## 2015-09-04 DIAGNOSIS — I120 Hypertensive chronic kidney disease with stage 5 chronic kidney disease or end stage renal disease: Secondary | ICD-10-CM | POA: Diagnosis not present

## 2015-09-05 DIAGNOSIS — Z992 Dependence on renal dialysis: Secondary | ICD-10-CM | POA: Diagnosis not present

## 2015-09-05 DIAGNOSIS — N186 End stage renal disease: Secondary | ICD-10-CM | POA: Diagnosis not present

## 2015-09-05 DIAGNOSIS — I120 Hypertensive chronic kidney disease with stage 5 chronic kidney disease or end stage renal disease: Secondary | ICD-10-CM | POA: Diagnosis not present

## 2015-09-06 DIAGNOSIS — Z992 Dependence on renal dialysis: Secondary | ICD-10-CM | POA: Diagnosis not present

## 2015-09-06 DIAGNOSIS — N186 End stage renal disease: Secondary | ICD-10-CM | POA: Diagnosis not present

## 2015-09-07 DIAGNOSIS — I259 Chronic ischemic heart disease, unspecified: Secondary | ICD-10-CM | POA: Diagnosis not present

## 2015-09-07 DIAGNOSIS — I10 Essential (primary) hypertension: Secondary | ICD-10-CM | POA: Diagnosis not present

## 2015-09-07 DIAGNOSIS — R0602 Shortness of breath: Secondary | ICD-10-CM | POA: Diagnosis not present

## 2015-09-07 DIAGNOSIS — Z992 Dependence on renal dialysis: Secondary | ICD-10-CM | POA: Diagnosis not present

## 2015-09-07 DIAGNOSIS — C319 Malignant neoplasm of accessory sinus, unspecified: Secondary | ICD-10-CM | POA: Diagnosis not present

## 2015-09-07 DIAGNOSIS — C300 Malignant neoplasm of nasal cavity: Secondary | ICD-10-CM | POA: Diagnosis not present

## 2015-09-07 DIAGNOSIS — N186 End stage renal disease: Secondary | ICD-10-CM | POA: Diagnosis not present

## 2015-09-09 DIAGNOSIS — N186 End stage renal disease: Secondary | ICD-10-CM | POA: Diagnosis not present

## 2015-09-09 DIAGNOSIS — Z992 Dependence on renal dialysis: Secondary | ICD-10-CM | POA: Diagnosis not present

## 2015-09-10 DIAGNOSIS — D509 Iron deficiency anemia, unspecified: Secondary | ICD-10-CM | POA: Diagnosis not present

## 2015-09-10 DIAGNOSIS — N186 End stage renal disease: Secondary | ICD-10-CM | POA: Diagnosis not present

## 2015-09-10 DIAGNOSIS — N2581 Secondary hyperparathyroidism of renal origin: Secondary | ICD-10-CM | POA: Diagnosis not present

## 2015-09-10 DIAGNOSIS — Z992 Dependence on renal dialysis: Secondary | ICD-10-CM | POA: Diagnosis not present

## 2015-09-10 DIAGNOSIS — D631 Anemia in chronic kidney disease: Secondary | ICD-10-CM | POA: Diagnosis not present

## 2015-09-20 DIAGNOSIS — C319 Malignant neoplasm of accessory sinus, unspecified: Secondary | ICD-10-CM | POA: Diagnosis not present

## 2015-09-20 DIAGNOSIS — C300 Malignant neoplasm of nasal cavity: Secondary | ICD-10-CM | POA: Diagnosis not present

## 2015-09-20 DIAGNOSIS — Z4889 Encounter for other specified surgical aftercare: Secondary | ICD-10-CM | POA: Diagnosis not present

## 2015-09-28 DIAGNOSIS — N186 End stage renal disease: Secondary | ICD-10-CM | POA: Diagnosis not present

## 2015-09-28 DIAGNOSIS — Z992 Dependence on renal dialysis: Secondary | ICD-10-CM | POA: Diagnosis not present

## 2015-09-28 DIAGNOSIS — Z1159 Encounter for screening for other viral diseases: Secondary | ICD-10-CM | POA: Diagnosis not present

## 2015-09-28 DIAGNOSIS — N2581 Secondary hyperparathyroidism of renal origin: Secondary | ICD-10-CM | POA: Diagnosis not present

## 2015-09-30 DIAGNOSIS — R201 Hypoesthesia of skin: Secondary | ICD-10-CM | POA: Diagnosis not present

## 2015-09-30 DIAGNOSIS — L84 Corns and callosities: Secondary | ICD-10-CM | POA: Diagnosis not present

## 2015-09-30 DIAGNOSIS — J449 Chronic obstructive pulmonary disease, unspecified: Secondary | ICD-10-CM | POA: Diagnosis not present

## 2015-09-30 DIAGNOSIS — E1142 Type 2 diabetes mellitus with diabetic polyneuropathy: Secondary | ICD-10-CM | POA: Diagnosis not present

## 2015-09-30 DIAGNOSIS — N185 Chronic kidney disease, stage 5: Secondary | ICD-10-CM | POA: Diagnosis not present

## 2015-09-30 DIAGNOSIS — K219 Gastro-esophageal reflux disease without esophagitis: Secondary | ICD-10-CM | POA: Diagnosis not present

## 2015-09-30 DIAGNOSIS — I5032 Chronic diastolic (congestive) heart failure: Secondary | ICD-10-CM | POA: Diagnosis not present

## 2015-09-30 DIAGNOSIS — E1140 Type 2 diabetes mellitus with diabetic neuropathy, unspecified: Secondary | ICD-10-CM | POA: Diagnosis not present

## 2015-09-30 DIAGNOSIS — Z6828 Body mass index (BMI) 28.0-28.9, adult: Secondary | ICD-10-CM | POA: Diagnosis not present

## 2015-09-30 DIAGNOSIS — E1129 Type 2 diabetes mellitus with other diabetic kidney complication: Secondary | ICD-10-CM | POA: Diagnosis not present

## 2015-10-02 DIAGNOSIS — Z992 Dependence on renal dialysis: Secondary | ICD-10-CM | POA: Diagnosis not present

## 2015-10-02 DIAGNOSIS — N186 End stage renal disease: Secondary | ICD-10-CM | POA: Diagnosis not present

## 2015-10-04 DIAGNOSIS — N2581 Secondary hyperparathyroidism of renal origin: Secondary | ICD-10-CM | POA: Diagnosis not present

## 2015-10-04 DIAGNOSIS — Z992 Dependence on renal dialysis: Secondary | ICD-10-CM | POA: Diagnosis not present

## 2015-10-04 DIAGNOSIS — D631 Anemia in chronic kidney disease: Secondary | ICD-10-CM | POA: Diagnosis not present

## 2015-10-04 DIAGNOSIS — N186 End stage renal disease: Secondary | ICD-10-CM | POA: Diagnosis not present

## 2015-10-05 DIAGNOSIS — L0390 Cellulitis, unspecified: Secondary | ICD-10-CM | POA: Diagnosis not present

## 2015-10-05 DIAGNOSIS — Z6827 Body mass index (BMI) 27.0-27.9, adult: Secondary | ICD-10-CM | POA: Diagnosis not present

## 2015-10-05 DIAGNOSIS — Z1389 Encounter for screening for other disorder: Secondary | ICD-10-CM | POA: Diagnosis not present

## 2015-10-05 DIAGNOSIS — L508 Other urticaria: Secondary | ICD-10-CM | POA: Diagnosis not present

## 2015-10-19 DIAGNOSIS — C319 Malignant neoplasm of accessory sinus, unspecified: Secondary | ICD-10-CM | POA: Diagnosis not present

## 2015-10-19 DIAGNOSIS — Z483 Aftercare following surgery for neoplasm: Secondary | ICD-10-CM | POA: Diagnosis not present

## 2015-10-19 DIAGNOSIS — Z87891 Personal history of nicotine dependence: Secondary | ICD-10-CM | POA: Diagnosis not present

## 2015-10-19 DIAGNOSIS — C300 Malignant neoplasm of nasal cavity: Secondary | ICD-10-CM | POA: Diagnosis not present

## 2015-10-25 DIAGNOSIS — Z992 Dependence on renal dialysis: Secondary | ICD-10-CM | POA: Diagnosis not present

## 2015-10-25 DIAGNOSIS — N186 End stage renal disease: Secondary | ICD-10-CM | POA: Diagnosis not present

## 2015-11-02 DIAGNOSIS — N186 End stage renal disease: Secondary | ICD-10-CM | POA: Diagnosis not present

## 2015-11-02 DIAGNOSIS — Z992 Dependence on renal dialysis: Secondary | ICD-10-CM | POA: Diagnosis not present

## 2015-11-03 DIAGNOSIS — Z992 Dependence on renal dialysis: Secondary | ICD-10-CM | POA: Diagnosis not present

## 2015-11-03 DIAGNOSIS — D631 Anemia in chronic kidney disease: Secondary | ICD-10-CM | POA: Diagnosis not present

## 2015-11-03 DIAGNOSIS — N2581 Secondary hyperparathyroidism of renal origin: Secondary | ICD-10-CM | POA: Diagnosis not present

## 2015-11-03 DIAGNOSIS — N186 End stage renal disease: Secondary | ICD-10-CM | POA: Diagnosis not present

## 2015-12-01 DIAGNOSIS — Z992 Dependence on renal dialysis: Secondary | ICD-10-CM | POA: Diagnosis not present

## 2015-12-01 DIAGNOSIS — D509 Iron deficiency anemia, unspecified: Secondary | ICD-10-CM | POA: Diagnosis not present

## 2015-12-01 DIAGNOSIS — N2581 Secondary hyperparathyroidism of renal origin: Secondary | ICD-10-CM | POA: Diagnosis not present

## 2015-12-01 DIAGNOSIS — E119 Type 2 diabetes mellitus without complications: Secondary | ICD-10-CM | POA: Diagnosis not present

## 2015-12-01 DIAGNOSIS — N186 End stage renal disease: Secondary | ICD-10-CM | POA: Diagnosis not present

## 2015-12-01 DIAGNOSIS — Z794 Long term (current) use of insulin: Secondary | ICD-10-CM | POA: Diagnosis not present

## 2015-12-02 DIAGNOSIS — N186 End stage renal disease: Secondary | ICD-10-CM | POA: Diagnosis not present

## 2015-12-02 DIAGNOSIS — Z992 Dependence on renal dialysis: Secondary | ICD-10-CM | POA: Diagnosis not present

## 2015-12-03 DIAGNOSIS — N2581 Secondary hyperparathyroidism of renal origin: Secondary | ICD-10-CM | POA: Diagnosis not present

## 2015-12-03 DIAGNOSIS — D509 Iron deficiency anemia, unspecified: Secondary | ICD-10-CM | POA: Diagnosis not present

## 2015-12-03 DIAGNOSIS — N186 End stage renal disease: Secondary | ICD-10-CM | POA: Diagnosis not present

## 2015-12-03 DIAGNOSIS — Z992 Dependence on renal dialysis: Secondary | ICD-10-CM | POA: Diagnosis not present

## 2015-12-03 DIAGNOSIS — D631 Anemia in chronic kidney disease: Secondary | ICD-10-CM | POA: Diagnosis not present

## 2015-12-08 DIAGNOSIS — R201 Hypoesthesia of skin: Secondary | ICD-10-CM | POA: Diagnosis not present

## 2015-12-08 DIAGNOSIS — R252 Cramp and spasm: Secondary | ICD-10-CM | POA: Diagnosis not present

## 2015-12-08 DIAGNOSIS — J018 Other acute sinusitis: Secondary | ICD-10-CM | POA: Diagnosis not present

## 2015-12-08 DIAGNOSIS — Z6828 Body mass index (BMI) 28.0-28.9, adult: Secondary | ICD-10-CM | POA: Diagnosis not present

## 2015-12-08 DIAGNOSIS — L84 Corns and callosities: Secondary | ICD-10-CM | POA: Diagnosis not present

## 2015-12-08 DIAGNOSIS — J449 Chronic obstructive pulmonary disease, unspecified: Secondary | ICD-10-CM | POA: Diagnosis not present

## 2015-12-08 DIAGNOSIS — J209 Acute bronchitis, unspecified: Secondary | ICD-10-CM | POA: Diagnosis not present

## 2015-12-08 DIAGNOSIS — E663 Overweight: Secondary | ICD-10-CM | POA: Diagnosis not present

## 2015-12-08 DIAGNOSIS — E1129 Type 2 diabetes mellitus with other diabetic kidney complication: Secondary | ICD-10-CM | POA: Diagnosis not present

## 2015-12-08 DIAGNOSIS — K219 Gastro-esophageal reflux disease without esophagitis: Secondary | ICD-10-CM | POA: Diagnosis not present

## 2015-12-09 DIAGNOSIS — J449 Chronic obstructive pulmonary disease, unspecified: Secondary | ICD-10-CM | POA: Diagnosis not present

## 2015-12-28 DIAGNOSIS — C44321 Squamous cell carcinoma of skin of nose: Secondary | ICD-10-CM | POA: Diagnosis not present

## 2015-12-29 DIAGNOSIS — Z992 Dependence on renal dialysis: Secondary | ICD-10-CM | POA: Diagnosis not present

## 2015-12-29 DIAGNOSIS — N186 End stage renal disease: Secondary | ICD-10-CM | POA: Diagnosis not present

## 2016-01-02 DIAGNOSIS — Z992 Dependence on renal dialysis: Secondary | ICD-10-CM | POA: Diagnosis not present

## 2016-01-02 DIAGNOSIS — N186 End stage renal disease: Secondary | ICD-10-CM | POA: Diagnosis not present

## 2016-01-02 DIAGNOSIS — H524 Presbyopia: Secondary | ICD-10-CM | POA: Diagnosis not present

## 2016-01-03 DIAGNOSIS — N186 End stage renal disease: Secondary | ICD-10-CM | POA: Diagnosis not present

## 2016-01-03 DIAGNOSIS — Z992 Dependence on renal dialysis: Secondary | ICD-10-CM | POA: Diagnosis not present

## 2016-01-03 DIAGNOSIS — D631 Anemia in chronic kidney disease: Secondary | ICD-10-CM | POA: Diagnosis not present

## 2016-01-03 DIAGNOSIS — D509 Iron deficiency anemia, unspecified: Secondary | ICD-10-CM | POA: Diagnosis not present

## 2016-01-03 DIAGNOSIS — N2581 Secondary hyperparathyroidism of renal origin: Secondary | ICD-10-CM | POA: Diagnosis not present

## 2016-01-04 DIAGNOSIS — E1029 Type 1 diabetes mellitus with other diabetic kidney complication: Secondary | ICD-10-CM | POA: Diagnosis not present

## 2016-01-09 DIAGNOSIS — E113413 Type 2 diabetes mellitus with severe nonproliferative diabetic retinopathy with macular edema, bilateral: Secondary | ICD-10-CM | POA: Diagnosis not present

## 2016-01-09 DIAGNOSIS — H26491 Other secondary cataract, right eye: Secondary | ICD-10-CM | POA: Diagnosis not present

## 2016-01-09 DIAGNOSIS — J449 Chronic obstructive pulmonary disease, unspecified: Secondary | ICD-10-CM | POA: Diagnosis not present

## 2016-01-17 DIAGNOSIS — N2581 Secondary hyperparathyroidism of renal origin: Secondary | ICD-10-CM | POA: Diagnosis not present

## 2016-01-19 DIAGNOSIS — E1151 Type 2 diabetes mellitus with diabetic peripheral angiopathy without gangrene: Secondary | ICD-10-CM | POA: Diagnosis not present

## 2016-01-19 DIAGNOSIS — L84 Corns and callosities: Secondary | ICD-10-CM | POA: Diagnosis not present

## 2016-01-19 DIAGNOSIS — B351 Tinea unguium: Secondary | ICD-10-CM | POA: Diagnosis not present

## 2016-01-25 DIAGNOSIS — J439 Emphysema, unspecified: Secondary | ICD-10-CM | POA: Diagnosis not present

## 2016-01-25 DIAGNOSIS — Z483 Aftercare following surgery for neoplasm: Secondary | ICD-10-CM | POA: Diagnosis not present

## 2016-01-25 DIAGNOSIS — C319 Malignant neoplasm of accessory sinus, unspecified: Secondary | ICD-10-CM | POA: Diagnosis not present

## 2016-01-25 DIAGNOSIS — C801 Malignant (primary) neoplasm, unspecified: Secondary | ICD-10-CM | POA: Diagnosis not present

## 2016-01-25 DIAGNOSIS — R911 Solitary pulmonary nodule: Secondary | ICD-10-CM | POA: Diagnosis not present

## 2016-01-25 DIAGNOSIS — C300 Malignant neoplasm of nasal cavity: Secondary | ICD-10-CM | POA: Diagnosis not present

## 2016-01-25 DIAGNOSIS — Z87891 Personal history of nicotine dependence: Secondary | ICD-10-CM | POA: Diagnosis not present

## 2016-01-25 DIAGNOSIS — C44321 Squamous cell carcinoma of skin of nose: Secondary | ICD-10-CM | POA: Diagnosis not present

## 2016-01-25 DIAGNOSIS — R918 Other nonspecific abnormal finding of lung field: Secondary | ICD-10-CM | POA: Diagnosis not present

## 2016-01-26 DIAGNOSIS — N186 End stage renal disease: Secondary | ICD-10-CM | POA: Diagnosis not present

## 2016-01-26 DIAGNOSIS — Z992 Dependence on renal dialysis: Secondary | ICD-10-CM | POA: Diagnosis not present

## 2016-02-02 DIAGNOSIS — Z992 Dependence on renal dialysis: Secondary | ICD-10-CM | POA: Diagnosis not present

## 2016-02-02 DIAGNOSIS — N186 End stage renal disease: Secondary | ICD-10-CM | POA: Diagnosis not present

## 2016-02-03 DIAGNOSIS — E1165 Type 2 diabetes mellitus with hyperglycemia: Secondary | ICD-10-CM | POA: Diagnosis not present

## 2016-02-03 DIAGNOSIS — R201 Hypoesthesia of skin: Secondary | ICD-10-CM | POA: Diagnosis not present

## 2016-02-03 DIAGNOSIS — G252 Other specified forms of tremor: Secondary | ICD-10-CM | POA: Diagnosis not present

## 2016-02-03 DIAGNOSIS — N185 Chronic kidney disease, stage 5: Secondary | ICD-10-CM | POA: Diagnosis not present

## 2016-02-03 DIAGNOSIS — E1142 Type 2 diabetes mellitus with diabetic polyneuropathy: Secondary | ICD-10-CM | POA: Diagnosis not present

## 2016-02-03 DIAGNOSIS — Z6830 Body mass index (BMI) 30.0-30.9, adult: Secondary | ICD-10-CM | POA: Diagnosis not present

## 2016-02-03 DIAGNOSIS — L84 Corns and callosities: Secondary | ICD-10-CM | POA: Diagnosis not present

## 2016-02-03 DIAGNOSIS — R600 Localized edema: Secondary | ICD-10-CM | POA: Diagnosis not present

## 2016-02-03 DIAGNOSIS — J449 Chronic obstructive pulmonary disease, unspecified: Secondary | ICD-10-CM | POA: Diagnosis not present

## 2016-02-03 DIAGNOSIS — G894 Chronic pain syndrome: Secondary | ICD-10-CM | POA: Diagnosis not present

## 2016-02-04 DIAGNOSIS — Z23 Encounter for immunization: Secondary | ICD-10-CM | POA: Diagnosis not present

## 2016-02-04 DIAGNOSIS — D509 Iron deficiency anemia, unspecified: Secondary | ICD-10-CM | POA: Diagnosis not present

## 2016-02-04 DIAGNOSIS — N186 End stage renal disease: Secondary | ICD-10-CM | POA: Diagnosis not present

## 2016-02-04 DIAGNOSIS — N2581 Secondary hyperparathyroidism of renal origin: Secondary | ICD-10-CM | POA: Diagnosis not present

## 2016-02-04 DIAGNOSIS — Z992 Dependence on renal dialysis: Secondary | ICD-10-CM | POA: Diagnosis not present

## 2016-02-04 DIAGNOSIS — D631 Anemia in chronic kidney disease: Secondary | ICD-10-CM | POA: Diagnosis not present

## 2016-02-09 DIAGNOSIS — J449 Chronic obstructive pulmonary disease, unspecified: Secondary | ICD-10-CM | POA: Diagnosis not present

## 2016-02-28 DIAGNOSIS — E119 Type 2 diabetes mellitus without complications: Secondary | ICD-10-CM | POA: Diagnosis not present

## 2016-02-28 DIAGNOSIS — N2581 Secondary hyperparathyroidism of renal origin: Secondary | ICD-10-CM | POA: Diagnosis not present

## 2016-02-28 DIAGNOSIS — N186 End stage renal disease: Secondary | ICD-10-CM | POA: Diagnosis not present

## 2016-02-28 DIAGNOSIS — D509 Iron deficiency anemia, unspecified: Secondary | ICD-10-CM | POA: Diagnosis not present

## 2016-02-28 DIAGNOSIS — E785 Hyperlipidemia, unspecified: Secondary | ICD-10-CM | POA: Diagnosis not present

## 2016-02-28 DIAGNOSIS — Z794 Long term (current) use of insulin: Secondary | ICD-10-CM | POA: Diagnosis not present

## 2016-02-28 DIAGNOSIS — Z992 Dependence on renal dialysis: Secondary | ICD-10-CM | POA: Diagnosis not present

## 2016-03-03 DIAGNOSIS — N186 End stage renal disease: Secondary | ICD-10-CM | POA: Diagnosis not present

## 2016-03-03 DIAGNOSIS — Z992 Dependence on renal dialysis: Secondary | ICD-10-CM | POA: Diagnosis not present

## 2016-03-06 DIAGNOSIS — N2581 Secondary hyperparathyroidism of renal origin: Secondary | ICD-10-CM | POA: Diagnosis not present

## 2016-03-06 DIAGNOSIS — D509 Iron deficiency anemia, unspecified: Secondary | ICD-10-CM | POA: Diagnosis not present

## 2016-03-06 DIAGNOSIS — D631 Anemia in chronic kidney disease: Secondary | ICD-10-CM | POA: Diagnosis not present

## 2016-03-06 DIAGNOSIS — N186 End stage renal disease: Secondary | ICD-10-CM | POA: Diagnosis not present

## 2016-03-06 DIAGNOSIS — Z992 Dependence on renal dialysis: Secondary | ICD-10-CM | POA: Diagnosis not present

## 2016-03-10 DIAGNOSIS — J449 Chronic obstructive pulmonary disease, unspecified: Secondary | ICD-10-CM | POA: Diagnosis not present

## 2016-03-16 ENCOUNTER — Ambulatory Visit: Payer: Medicare Other | Admitting: Neurology

## 2016-03-27 DIAGNOSIS — N186 End stage renal disease: Secondary | ICD-10-CM | POA: Diagnosis not present

## 2016-03-27 DIAGNOSIS — Z992 Dependence on renal dialysis: Secondary | ICD-10-CM | POA: Diagnosis not present

## 2016-04-03 DIAGNOSIS — N186 End stage renal disease: Secondary | ICD-10-CM | POA: Diagnosis not present

## 2016-04-03 DIAGNOSIS — Z992 Dependence on renal dialysis: Secondary | ICD-10-CM | POA: Diagnosis not present

## 2016-04-05 DIAGNOSIS — Z992 Dependence on renal dialysis: Secondary | ICD-10-CM | POA: Diagnosis not present

## 2016-04-05 DIAGNOSIS — D509 Iron deficiency anemia, unspecified: Secondary | ICD-10-CM | POA: Diagnosis not present

## 2016-04-05 DIAGNOSIS — D631 Anemia in chronic kidney disease: Secondary | ICD-10-CM | POA: Diagnosis not present

## 2016-04-05 DIAGNOSIS — N186 End stage renal disease: Secondary | ICD-10-CM | POA: Diagnosis not present

## 2016-04-05 DIAGNOSIS — N2581 Secondary hyperparathyroidism of renal origin: Secondary | ICD-10-CM | POA: Diagnosis not present

## 2016-04-10 DIAGNOSIS — J449 Chronic obstructive pulmonary disease, unspecified: Secondary | ICD-10-CM | POA: Diagnosis not present

## 2016-04-17 DIAGNOSIS — N2581 Secondary hyperparathyroidism of renal origin: Secondary | ICD-10-CM | POA: Diagnosis not present

## 2016-04-19 DIAGNOSIS — E6609 Other obesity due to excess calories: Secondary | ICD-10-CM | POA: Diagnosis not present

## 2016-04-19 DIAGNOSIS — E1140 Type 2 diabetes mellitus with diabetic neuropathy, unspecified: Secondary | ICD-10-CM | POA: Diagnosis not present

## 2016-04-19 DIAGNOSIS — R0602 Shortness of breath: Secondary | ICD-10-CM | POA: Diagnosis not present

## 2016-04-19 DIAGNOSIS — Z1389 Encounter for screening for other disorder: Secondary | ICD-10-CM | POA: Diagnosis not present

## 2016-04-19 DIAGNOSIS — R6889 Other general symptoms and signs: Secondary | ICD-10-CM | POA: Diagnosis not present

## 2016-04-19 DIAGNOSIS — J040 Acute laryngitis: Secondary | ICD-10-CM | POA: Diagnosis not present

## 2016-04-19 DIAGNOSIS — Z6830 Body mass index (BMI) 30.0-30.9, adult: Secondary | ICD-10-CM | POA: Diagnosis not present

## 2016-04-19 DIAGNOSIS — J343 Hypertrophy of nasal turbinates: Secondary | ICD-10-CM | POA: Diagnosis not present

## 2016-04-19 DIAGNOSIS — E1029 Type 1 diabetes mellitus with other diabetic kidney complication: Secondary | ICD-10-CM | POA: Diagnosis not present

## 2016-04-19 DIAGNOSIS — E669 Obesity, unspecified: Secondary | ICD-10-CM | POA: Diagnosis not present

## 2016-04-19 DIAGNOSIS — J069 Acute upper respiratory infection, unspecified: Secondary | ICD-10-CM | POA: Diagnosis not present

## 2016-04-23 ENCOUNTER — Ambulatory Visit: Payer: Medicare Other | Admitting: Neurology

## 2016-04-24 DIAGNOSIS — D509 Iron deficiency anemia, unspecified: Secondary | ICD-10-CM | POA: Diagnosis not present

## 2016-05-01 DIAGNOSIS — Z992 Dependence on renal dialysis: Secondary | ICD-10-CM | POA: Diagnosis not present

## 2016-05-01 DIAGNOSIS — N186 End stage renal disease: Secondary | ICD-10-CM | POA: Diagnosis not present

## 2016-05-03 DIAGNOSIS — Z992 Dependence on renal dialysis: Secondary | ICD-10-CM | POA: Diagnosis not present

## 2016-05-03 DIAGNOSIS — N186 End stage renal disease: Secondary | ICD-10-CM | POA: Diagnosis not present

## 2016-05-05 DIAGNOSIS — N2581 Secondary hyperparathyroidism of renal origin: Secondary | ICD-10-CM | POA: Diagnosis not present

## 2016-05-05 DIAGNOSIS — D631 Anemia in chronic kidney disease: Secondary | ICD-10-CM | POA: Diagnosis not present

## 2016-05-05 DIAGNOSIS — N186 End stage renal disease: Secondary | ICD-10-CM | POA: Diagnosis not present

## 2016-05-05 DIAGNOSIS — D509 Iron deficiency anemia, unspecified: Secondary | ICD-10-CM | POA: Diagnosis not present

## 2016-05-05 DIAGNOSIS — Z992 Dependence on renal dialysis: Secondary | ICD-10-CM | POA: Diagnosis not present

## 2016-05-07 ENCOUNTER — Encounter: Payer: Self-pay | Admitting: Vascular Surgery

## 2016-05-08 DIAGNOSIS — N2581 Secondary hyperparathyroidism of renal origin: Secondary | ICD-10-CM | POA: Diagnosis not present

## 2016-05-08 DIAGNOSIS — Z992 Dependence on renal dialysis: Secondary | ICD-10-CM | POA: Diagnosis not present

## 2016-05-08 DIAGNOSIS — N186 End stage renal disease: Secondary | ICD-10-CM | POA: Diagnosis not present

## 2016-05-08 NOTE — Progress Notes (Addendum)
Established Dialysis Access  History of Present Illness   Thomas [name] is a 75 y.o. ([birth year]) male who presents for re-evaluation of swelling in L arm.  I previously placed this fistula in 06/29/13.  The patient had previously had no difficulties with cannulation.  He has developed swelling in his entire body after a bout of flu recently.  Since then he has had more swelling in his L arm also that has made cannulation more difficult.  The patient's PMH, PSH, SH, and FamHx are unchanged from 08/14/13.  Current Outpatient Prescriptions  Medication Sig Dispense Refill  . acetaminophen (TYLENOL) 500 MG tablet Take 500 mg by mouth every 6 (six) hours as needed for headache.    . albuterol (PROVENTIL,VENTOLIN) 2 MG/5ML syrup Take 2 mg by mouth 3 (three) times daily.    Marland Kitchen amLODipine (NORVASC) 5 MG tablet Take 5 mg by mouth daily.    Marland Kitchen aspirin EC 81 MG tablet Take 81 mg by mouth daily.    Marland Kitchen b complex-vitamin c-folic acid (NEPHRO-VITE) 0.8 MG TABS tablet Take 1 tablet by mouth daily.    . budesonide-formoterol (SYMBICORT) 160-4.5 MCG/ACT inhaler Inhale 2 puffs into the lungs daily.    . butalbital-acetaminophen-caffeine (FIORICET, ESGIC) 50-325-40 MG per tablet Take 1 tablet by mouth every 6 (six) hours as needed for headache.     . calcium acetate (PHOSLO) 667 MG capsule Take 667-1,334 mg by mouth See admin instructions. TAKE 2 CAPS BY MOUTH 3 TIMES DAILY WITH MEALS. TAKE 1 CAP 2 TIMES DAILY WITH SNACKS    . calcium-vitamin D (OSCAL WITH D) 500-200 MG-UNIT per tablet Take 1 tablet by mouth daily with breakfast.    . cholecalciferol (VITAMIN D) 1000 UNITS tablet Take 1,000 Units by mouth daily.     . Cyanocobalamin (VITAMIN B-12 PO) Take 1 tablet by mouth 2 (two) times daily.    Marland Kitchen dicyclomine (BENTYL) 20 MG tablet Take 20 mg by mouth 4 (four) times daily as needed for spasms.     Marland Kitchen gemfibrozil (LOPID) 600 MG tablet Take 600 mg by mouth daily.     . hyoscyamine (LEVSIN, ANASPAZ) 0.125 MG tablet  Take 0.125 mg by mouth 3 (three) times daily as needed for bladder spasms or cramping.     . isosorbide mononitrate (IMDUR) 30 MG 24 hr tablet Take 30 mg by mouth daily.     [name] Oil 300 MG CAPS Take 1 capsule by mouth daily.     Marland Kitchen LANTUS SOLOSTAR 100 UNIT/ML injection Inject 20 Units into the skin at bedtime. According to sugar level.    Marland Kitchen losartan-hydrochlorothiazide (HYZAAR) 100-12.5 MG per tablet Take 1 tablet by mouth daily.     . magnesium oxide (MAG-OX) 400 MG tablet Take 400 mg by mouth daily.    . Methylcellulose, Laxative, (FIBER THERAPY PO) Take 1 tablet by mouth 2 (two) times daily.    . metoprolol (LOPRESSOR) 100 MG tablet Take 100 mg by mouth 2 (two) times daily.     Marland Kitchen NITROSTAT 0.4 MG SL tablet Place 0.4 mg under the tongue every 5 (five) minutes as needed for chest pain.     Marland Kitchen omeprazole (PRILOSEC) 20 MG capsule Take 20 mg by mouth 2 (two) times daily before a meal.    . ondansetron (ZOFRAN) 8 MG tablet Take 8 mg by mouth every 6 (six) hours as needed for nausea or vomiting.     Marland Kitchen oxyCODONE-acetaminophen (ROXICET) 5-325 MG tablet Take 1-2 tablets by mouth  every 4 (four) hours as needed for severe pain. 30 tablet 0  . pravastatin (PRAVACHOL) 40 MG tablet Take 40 mg by mouth daily.     Marland Kitchen tiotropium (SPIRIVA) 18 MCG inhalation capsule Place 18 mcg into inhaler and inhale daily.    . VENTOLIN HFA 108 (90 BASE) MCG/ACT inhaler Inhale 2 puffs into the lungs every 6 (six) hours as needed for wheezing or shortness of breath.     . vitamin C (ASCORBIC ACID) 500 MG tablet Take 500 mg by mouth 2 (two) times daily.     No current facility-administered medications for this visit.     Allergies  Allergen Reactions  . Statins Swelling    Caused feet, tongue and throat to swell.  . Codeine Other (See Comments)    Muscle Spasms    On ROS today: increased SOB, increased edema in legs   Physical Examination  Vitals:   05/11/16 1352 05/11/16 1357  BP: (!) 160/64 (!) 168/68  Pulse:  64 68  Resp: 20   Temp: 97.3 F (36.3 C)   TempSrc: Oral   SpO2: 93%   Weight: 216 lb (98 kg)   Height: 5\' 11"  (1.803 m)    Body mass index is 30.13 kg/m.  General: A&O x 3, WD, WN, artifical nose  Pulmonary: Sym exp, good air movt, CTAB, no rales, rhonchi, & wheezing  Cardiac: RRR, Nl S1, S2, no Murmurs, rubs or gallops  Vascular: Vessel Right Left  Radial Palpable Palpable  Ulnar Not Palpable Not Palpable  Brachial Palpable Palpable   Gastrointestinal: soft, NTND, no G/R, bo HSM, no masses, no CVAT B  Musculoskeletal: M/S 5/5 throughout , Extremities without  ischemic changes , + palpable thrill in access in L forearm, + bruit in access, moderate sized PSA in mid-forearm, LUE 1-2+ edema in whole arm, LLE 2+ edema, RUE 1+ edema, RLE 1-2+ edema  Neurologic: Pain and light touch intact in extremities , Motor exam as listed above   Medical Decision Making  Thomas [name] is a 75 y.o. 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  I recommended: re-evaluation with Cardiology.  I also recommend: L arm fistulogram, possible intervention after optimization by Cardiology. I discussed with the patient the nature of angiographic procedures, especially the limited patencies of any endovascular intervention.   The patient is aware of that the risks of an angiographic procedure include but are not limited to: bleeding, infection, access site complications, renal failure, embolization, rupture of vessel, dissection, arteriovenous fistula, possible need for emergent surgical intervention, possible need for surgical procedures to treat the patient's pathology, anaphylactic reaction to contrast, and stroke and death.   The patient will contact us once his cardiac optimization has occurred so we proceed with  scheduling the L arm fistulogram.  Adele Barthel, MD, FACS [facility] Office: [phone] Pager: [phone]

## 2016-05-10 DIAGNOSIS — J449 Chronic obstructive pulmonary disease, unspecified: Secondary | ICD-10-CM | POA: Diagnosis not present

## 2016-05-11 ENCOUNTER — Encounter: Payer: Self-pay | Admitting: Vascular Surgery

## 2016-05-11 ENCOUNTER — Ambulatory Visit (INDEPENDENT_AMBULATORY_CARE_PROVIDER_SITE_OTHER): Payer: Medicare Other | Admitting: Vascular Surgery

## 2016-05-11 VITALS — BP 168/68 | HR 68 | Temp 97.3°F | Resp 20 | Ht 71.0 in | Wt 216.0 lb

## 2016-05-11 DIAGNOSIS — I871 Compression of vein: Secondary | ICD-10-CM | POA: Diagnosis not present

## 2016-05-11 DIAGNOSIS — I509 Heart failure, unspecified: Secondary | ICD-10-CM | POA: Insufficient documentation

## 2016-05-15 DIAGNOSIS — N186 End stage renal disease: Secondary | ICD-10-CM | POA: Diagnosis not present

## 2016-05-15 DIAGNOSIS — R0602 Shortness of breath: Secondary | ICD-10-CM | POA: Diagnosis not present

## 2016-05-15 DIAGNOSIS — E1122 Type 2 diabetes mellitus with diabetic chronic kidney disease: Secondary | ICD-10-CM | POA: Diagnosis not present

## 2016-05-15 DIAGNOSIS — R918 Other nonspecific abnormal finding of lung field: Secondary | ICD-10-CM | POA: Diagnosis not present

## 2016-05-15 DIAGNOSIS — E8770 Fluid overload, unspecified: Secondary | ICD-10-CM | POA: Diagnosis not present

## 2016-05-15 DIAGNOSIS — Z888 Allergy status to other drugs, medicaments and biological substances status: Secondary | ICD-10-CM | POA: Diagnosis not present

## 2016-05-15 DIAGNOSIS — J441 Chronic obstructive pulmonary disease with (acute) exacerbation: Secondary | ICD-10-CM | POA: Diagnosis not present

## 2016-05-15 DIAGNOSIS — Z885 Allergy status to narcotic agent status: Secondary | ICD-10-CM | POA: Diagnosis not present

## 2016-05-15 DIAGNOSIS — Z955 Presence of coronary angioplasty implant and graft: Secondary | ICD-10-CM | POA: Diagnosis not present

## 2016-05-15 DIAGNOSIS — I120 Hypertensive chronic kidney disease with stage 5 chronic kidney disease or end stage renal disease: Secondary | ICD-10-CM | POA: Diagnosis not present

## 2016-05-15 DIAGNOSIS — M1990 Unspecified osteoarthritis, unspecified site: Secondary | ICD-10-CM | POA: Diagnosis not present

## 2016-05-15 DIAGNOSIS — Z87891 Personal history of nicotine dependence: Secondary | ICD-10-CM | POA: Diagnosis not present

## 2016-05-16 DIAGNOSIS — R0602 Shortness of breath: Secondary | ICD-10-CM | POA: Diagnosis not present

## 2016-05-17 DIAGNOSIS — N2581 Secondary hyperparathyroidism of renal origin: Secondary | ICD-10-CM | POA: Diagnosis not present

## 2016-05-18 DIAGNOSIS — Z23 Encounter for immunization: Secondary | ICD-10-CM | POA: Diagnosis not present

## 2016-05-18 DIAGNOSIS — Z79899 Other long term (current) drug therapy: Secondary | ICD-10-CM | POA: Diagnosis not present

## 2016-05-18 DIAGNOSIS — I120 Hypertensive chronic kidney disease with stage 5 chronic kidney disease or end stage renal disease: Secondary | ICD-10-CM | POA: Diagnosis not present

## 2016-05-18 DIAGNOSIS — E8770 Fluid overload, unspecified: Secondary | ICD-10-CM | POA: Diagnosis not present

## 2016-05-18 DIAGNOSIS — I2510 Atherosclerotic heart disease of native coronary artery without angina pectoris: Secondary | ICD-10-CM | POA: Diagnosis not present

## 2016-05-18 DIAGNOSIS — Z87891 Personal history of nicotine dependence: Secondary | ICD-10-CM | POA: Diagnosis not present

## 2016-05-18 DIAGNOSIS — Z992 Dependence on renal dialysis: Secondary | ICD-10-CM | POA: Diagnosis not present

## 2016-05-18 DIAGNOSIS — Z85819 Personal history of malignant neoplasm of unspecified site of lip, oral cavity, and pharynx: Secondary | ICD-10-CM | POA: Diagnosis not present

## 2016-05-18 DIAGNOSIS — N186 End stage renal disease: Secondary | ICD-10-CM | POA: Diagnosis not present

## 2016-05-24 DIAGNOSIS — E119 Type 2 diabetes mellitus without complications: Secondary | ICD-10-CM | POA: Diagnosis not present

## 2016-05-24 DIAGNOSIS — Z794 Long term (current) use of insulin: Secondary | ICD-10-CM | POA: Diagnosis not present

## 2016-05-24 DIAGNOSIS — D509 Iron deficiency anemia, unspecified: Secondary | ICD-10-CM | POA: Diagnosis not present

## 2016-05-24 DIAGNOSIS — N186 End stage renal disease: Secondary | ICD-10-CM | POA: Diagnosis not present

## 2016-05-24 DIAGNOSIS — N2581 Secondary hyperparathyroidism of renal origin: Secondary | ICD-10-CM | POA: Diagnosis not present

## 2016-05-24 DIAGNOSIS — Z992 Dependence on renal dialysis: Secondary | ICD-10-CM | POA: Diagnosis not present

## 2016-05-24 DIAGNOSIS — E785 Hyperlipidemia, unspecified: Secondary | ICD-10-CM | POA: Diagnosis not present

## 2016-05-30 DIAGNOSIS — I350 Nonrheumatic aortic (valve) stenosis: Secondary | ICD-10-CM | POA: Diagnosis not present

## 2016-05-30 DIAGNOSIS — I120 Hypertensive chronic kidney disease with stage 5 chronic kidney disease or end stage renal disease: Secondary | ICD-10-CM | POA: Diagnosis not present

## 2016-05-30 DIAGNOSIS — E785 Hyperlipidemia, unspecified: Secondary | ICD-10-CM | POA: Diagnosis not present

## 2016-05-30 DIAGNOSIS — I340 Nonrheumatic mitral (valve) insufficiency: Secondary | ICD-10-CM | POA: Diagnosis not present

## 2016-05-30 DIAGNOSIS — I2510 Atherosclerotic heart disease of native coronary artery without angina pectoris: Secondary | ICD-10-CM | POA: Diagnosis not present

## 2016-05-30 DIAGNOSIS — M7989 Other specified soft tissue disorders: Secondary | ICD-10-CM | POA: Diagnosis not present

## 2016-05-30 DIAGNOSIS — I259 Chronic ischemic heart disease, unspecified: Secondary | ICD-10-CM | POA: Diagnosis not present

## 2016-05-30 DIAGNOSIS — I10 Essential (primary) hypertension: Secondary | ICD-10-CM | POA: Diagnosis not present

## 2016-05-30 DIAGNOSIS — Z87891 Personal history of nicotine dependence: Secondary | ICD-10-CM | POA: Diagnosis not present

## 2016-05-30 DIAGNOSIS — N186 End stage renal disease: Secondary | ICD-10-CM | POA: Diagnosis not present

## 2016-05-30 DIAGNOSIS — E119 Type 2 diabetes mellitus without complications: Secondary | ICD-10-CM | POA: Diagnosis not present

## 2016-06-03 DIAGNOSIS — Z992 Dependence on renal dialysis: Secondary | ICD-10-CM | POA: Diagnosis not present

## 2016-06-03 DIAGNOSIS — N186 End stage renal disease: Secondary | ICD-10-CM | POA: Diagnosis not present

## 2016-06-05 DIAGNOSIS — Z992 Dependence on renal dialysis: Secondary | ICD-10-CM | POA: Diagnosis not present

## 2016-06-05 DIAGNOSIS — N2581 Secondary hyperparathyroidism of renal origin: Secondary | ICD-10-CM | POA: Diagnosis not present

## 2016-06-05 DIAGNOSIS — N186 End stage renal disease: Secondary | ICD-10-CM | POA: Diagnosis not present

## 2016-06-05 DIAGNOSIS — D631 Anemia in chronic kidney disease: Secondary | ICD-10-CM | POA: Diagnosis not present

## 2016-06-05 DIAGNOSIS — D509 Iron deficiency anemia, unspecified: Secondary | ICD-10-CM | POA: Diagnosis not present

## 2016-06-08 DIAGNOSIS — Z87891 Personal history of nicotine dependence: Secondary | ICD-10-CM | POA: Diagnosis not present

## 2016-06-08 DIAGNOSIS — I120 Hypertensive chronic kidney disease with stage 5 chronic kidney disease or end stage renal disease: Secondary | ICD-10-CM | POA: Diagnosis not present

## 2016-06-08 DIAGNOSIS — E8770 Fluid overload, unspecified: Secondary | ICD-10-CM | POA: Diagnosis not present

## 2016-06-08 DIAGNOSIS — Z955 Presence of coronary angioplasty implant and graft: Secondary | ICD-10-CM | POA: Diagnosis not present

## 2016-06-08 DIAGNOSIS — J449 Chronic obstructive pulmonary disease, unspecified: Secondary | ICD-10-CM | POA: Diagnosis not present

## 2016-06-08 DIAGNOSIS — Z992 Dependence on renal dialysis: Secondary | ICD-10-CM | POA: Diagnosis not present

## 2016-06-08 DIAGNOSIS — R0602 Shortness of breath: Secondary | ICD-10-CM | POA: Diagnosis not present

## 2016-06-08 DIAGNOSIS — N186 End stage renal disease: Secondary | ICD-10-CM | POA: Diagnosis not present

## 2016-06-08 DIAGNOSIS — E1122 Type 2 diabetes mellitus with diabetic chronic kidney disease: Secondary | ICD-10-CM | POA: Diagnosis not present

## 2016-06-08 DIAGNOSIS — R931 Abnormal findings on diagnostic imaging of heart and coronary circulation: Secondary | ICD-10-CM | POA: Diagnosis not present

## 2016-06-08 DIAGNOSIS — I2510 Atherosclerotic heart disease of native coronary artery without angina pectoris: Secondary | ICD-10-CM | POA: Diagnosis not present

## 2016-06-08 DIAGNOSIS — I252 Old myocardial infarction: Secondary | ICD-10-CM | POA: Diagnosis not present

## 2016-06-10 DIAGNOSIS — J449 Chronic obstructive pulmonary disease, unspecified: Secondary | ICD-10-CM | POA: Diagnosis not present

## 2016-06-28 DIAGNOSIS — N186 End stage renal disease: Secondary | ICD-10-CM | POA: Diagnosis not present

## 2016-06-28 DIAGNOSIS — Z992 Dependence on renal dialysis: Secondary | ICD-10-CM | POA: Diagnosis not present

## 2016-07-04 DIAGNOSIS — Z992 Dependence on renal dialysis: Secondary | ICD-10-CM | POA: Diagnosis not present

## 2016-07-04 DIAGNOSIS — N186 End stage renal disease: Secondary | ICD-10-CM | POA: Diagnosis not present

## 2016-07-05 DIAGNOSIS — D509 Iron deficiency anemia, unspecified: Secondary | ICD-10-CM | POA: Diagnosis not present

## 2016-07-05 DIAGNOSIS — N2581 Secondary hyperparathyroidism of renal origin: Secondary | ICD-10-CM | POA: Diagnosis not present

## 2016-07-05 DIAGNOSIS — D631 Anemia in chronic kidney disease: Secondary | ICD-10-CM | POA: Diagnosis not present

## 2016-07-05 DIAGNOSIS — Z992 Dependence on renal dialysis: Secondary | ICD-10-CM | POA: Diagnosis not present

## 2016-07-05 DIAGNOSIS — N186 End stage renal disease: Secondary | ICD-10-CM | POA: Diagnosis not present

## 2016-07-07 DIAGNOSIS — D509 Iron deficiency anemia, unspecified: Secondary | ICD-10-CM | POA: Diagnosis not present

## 2016-07-07 DIAGNOSIS — Z992 Dependence on renal dialysis: Secondary | ICD-10-CM | POA: Diagnosis not present

## 2016-07-07 DIAGNOSIS — D631 Anemia in chronic kidney disease: Secondary | ICD-10-CM | POA: Diagnosis not present

## 2016-07-07 DIAGNOSIS — N186 End stage renal disease: Secondary | ICD-10-CM | POA: Diagnosis not present

## 2016-07-07 DIAGNOSIS — N2581 Secondary hyperparathyroidism of renal origin: Secondary | ICD-10-CM | POA: Diagnosis not present

## 2016-07-10 DIAGNOSIS — D509 Iron deficiency anemia, unspecified: Secondary | ICD-10-CM | POA: Diagnosis not present

## 2016-07-10 DIAGNOSIS — Z992 Dependence on renal dialysis: Secondary | ICD-10-CM | POA: Diagnosis not present

## 2016-07-10 DIAGNOSIS — D631 Anemia in chronic kidney disease: Secondary | ICD-10-CM | POA: Diagnosis not present

## 2016-07-10 DIAGNOSIS — N2581 Secondary hyperparathyroidism of renal origin: Secondary | ICD-10-CM | POA: Diagnosis not present

## 2016-07-10 DIAGNOSIS — N186 End stage renal disease: Secondary | ICD-10-CM | POA: Diagnosis not present

## 2016-07-11 DIAGNOSIS — J449 Chronic obstructive pulmonary disease, unspecified: Secondary | ICD-10-CM | POA: Diagnosis not present

## 2016-07-12 DIAGNOSIS — N186 End stage renal disease: Secondary | ICD-10-CM | POA: Diagnosis not present

## 2016-07-12 DIAGNOSIS — Z992 Dependence on renal dialysis: Secondary | ICD-10-CM | POA: Diagnosis not present

## 2016-07-12 DIAGNOSIS — D509 Iron deficiency anemia, unspecified: Secondary | ICD-10-CM | POA: Diagnosis not present

## 2016-07-12 DIAGNOSIS — D631 Anemia in chronic kidney disease: Secondary | ICD-10-CM | POA: Diagnosis not present

## 2016-07-12 DIAGNOSIS — N2581 Secondary hyperparathyroidism of renal origin: Secondary | ICD-10-CM | POA: Diagnosis not present

## 2016-07-14 DIAGNOSIS — N2581 Secondary hyperparathyroidism of renal origin: Secondary | ICD-10-CM | POA: Diagnosis not present

## 2016-07-14 DIAGNOSIS — D631 Anemia in chronic kidney disease: Secondary | ICD-10-CM | POA: Diagnosis not present

## 2016-07-14 DIAGNOSIS — N186 End stage renal disease: Secondary | ICD-10-CM | POA: Diagnosis not present

## 2016-07-14 DIAGNOSIS — Z992 Dependence on renal dialysis: Secondary | ICD-10-CM | POA: Diagnosis not present

## 2016-07-14 DIAGNOSIS — D509 Iron deficiency anemia, unspecified: Secondary | ICD-10-CM | POA: Diagnosis not present

## 2016-07-17 DIAGNOSIS — D509 Iron deficiency anemia, unspecified: Secondary | ICD-10-CM | POA: Diagnosis not present

## 2016-07-17 DIAGNOSIS — N2581 Secondary hyperparathyroidism of renal origin: Secondary | ICD-10-CM | POA: Diagnosis not present

## 2016-07-17 DIAGNOSIS — D631 Anemia in chronic kidney disease: Secondary | ICD-10-CM | POA: Diagnosis not present

## 2016-07-17 DIAGNOSIS — Z992 Dependence on renal dialysis: Secondary | ICD-10-CM | POA: Diagnosis not present

## 2016-07-17 DIAGNOSIS — N186 End stage renal disease: Secondary | ICD-10-CM | POA: Diagnosis not present

## 2016-07-19 DIAGNOSIS — N186 End stage renal disease: Secondary | ICD-10-CM | POA: Diagnosis not present

## 2016-07-19 DIAGNOSIS — D631 Anemia in chronic kidney disease: Secondary | ICD-10-CM | POA: Diagnosis not present

## 2016-07-19 DIAGNOSIS — Z992 Dependence on renal dialysis: Secondary | ICD-10-CM | POA: Diagnosis not present

## 2016-07-19 DIAGNOSIS — D509 Iron deficiency anemia, unspecified: Secondary | ICD-10-CM | POA: Diagnosis not present

## 2016-07-19 DIAGNOSIS — N2581 Secondary hyperparathyroidism of renal origin: Secondary | ICD-10-CM | POA: Diagnosis not present

## 2016-07-21 DIAGNOSIS — D631 Anemia in chronic kidney disease: Secondary | ICD-10-CM | POA: Diagnosis not present

## 2016-07-21 DIAGNOSIS — N186 End stage renal disease: Secondary | ICD-10-CM | POA: Diagnosis not present

## 2016-07-21 DIAGNOSIS — D509 Iron deficiency anemia, unspecified: Secondary | ICD-10-CM | POA: Diagnosis not present

## 2016-07-21 DIAGNOSIS — N2581 Secondary hyperparathyroidism of renal origin: Secondary | ICD-10-CM | POA: Diagnosis not present

## 2016-07-21 DIAGNOSIS — Z992 Dependence on renal dialysis: Secondary | ICD-10-CM | POA: Diagnosis not present

## 2016-07-23 DIAGNOSIS — Z6830 Body mass index (BMI) 30.0-30.9, adult: Secondary | ICD-10-CM | POA: Diagnosis not present

## 2016-07-23 DIAGNOSIS — Z1389 Encounter for screening for other disorder: Secondary | ICD-10-CM | POA: Diagnosis not present

## 2016-07-23 DIAGNOSIS — E1029 Type 1 diabetes mellitus with other diabetic kidney complication: Secondary | ICD-10-CM | POA: Diagnosis not present

## 2016-07-23 DIAGNOSIS — N186 End stage renal disease: Secondary | ICD-10-CM | POA: Diagnosis not present

## 2016-07-23 DIAGNOSIS — R201 Hypoesthesia of skin: Secondary | ICD-10-CM | POA: Diagnosis not present

## 2016-07-23 DIAGNOSIS — E6609 Other obesity due to excess calories: Secondary | ICD-10-CM | POA: Diagnosis not present

## 2016-07-24 DIAGNOSIS — Z992 Dependence on renal dialysis: Secondary | ICD-10-CM | POA: Diagnosis not present

## 2016-07-24 DIAGNOSIS — N186 End stage renal disease: Secondary | ICD-10-CM | POA: Diagnosis not present

## 2016-07-24 DIAGNOSIS — N2581 Secondary hyperparathyroidism of renal origin: Secondary | ICD-10-CM | POA: Diagnosis not present

## 2016-07-24 DIAGNOSIS — D509 Iron deficiency anemia, unspecified: Secondary | ICD-10-CM | POA: Diagnosis not present

## 2016-07-24 DIAGNOSIS — D631 Anemia in chronic kidney disease: Secondary | ICD-10-CM | POA: Diagnosis not present

## 2016-07-25 DIAGNOSIS — C319 Malignant neoplasm of accessory sinus, unspecified: Secondary | ICD-10-CM | POA: Diagnosis not present

## 2016-07-25 DIAGNOSIS — Z87891 Personal history of nicotine dependence: Secondary | ICD-10-CM | POA: Diagnosis not present

## 2016-07-25 DIAGNOSIS — Z9889 Other specified postprocedural states: Secondary | ICD-10-CM | POA: Diagnosis not present

## 2016-07-25 DIAGNOSIS — C300 Malignant neoplasm of nasal cavity: Secondary | ICD-10-CM | POA: Diagnosis not present

## 2016-07-26 DIAGNOSIS — N2581 Secondary hyperparathyroidism of renal origin: Secondary | ICD-10-CM | POA: Diagnosis not present

## 2016-07-26 DIAGNOSIS — D509 Iron deficiency anemia, unspecified: Secondary | ICD-10-CM | POA: Diagnosis not present

## 2016-07-26 DIAGNOSIS — Z992 Dependence on renal dialysis: Secondary | ICD-10-CM | POA: Diagnosis not present

## 2016-07-26 DIAGNOSIS — D631 Anemia in chronic kidney disease: Secondary | ICD-10-CM | POA: Diagnosis not present

## 2016-07-26 DIAGNOSIS — N186 End stage renal disease: Secondary | ICD-10-CM | POA: Diagnosis not present

## 2016-07-28 DIAGNOSIS — N186 End stage renal disease: Secondary | ICD-10-CM | POA: Diagnosis not present

## 2016-07-28 DIAGNOSIS — D509 Iron deficiency anemia, unspecified: Secondary | ICD-10-CM | POA: Diagnosis not present

## 2016-07-28 DIAGNOSIS — N2581 Secondary hyperparathyroidism of renal origin: Secondary | ICD-10-CM | POA: Diagnosis not present

## 2016-07-28 DIAGNOSIS — D631 Anemia in chronic kidney disease: Secondary | ICD-10-CM | POA: Diagnosis not present

## 2016-07-28 DIAGNOSIS — Z992 Dependence on renal dialysis: Secondary | ICD-10-CM | POA: Diagnosis not present

## 2016-07-31 DIAGNOSIS — N2581 Secondary hyperparathyroidism of renal origin: Secondary | ICD-10-CM | POA: Diagnosis not present

## 2016-07-31 DIAGNOSIS — D509 Iron deficiency anemia, unspecified: Secondary | ICD-10-CM | POA: Diagnosis not present

## 2016-07-31 DIAGNOSIS — Z992 Dependence on renal dialysis: Secondary | ICD-10-CM | POA: Diagnosis not present

## 2016-07-31 DIAGNOSIS — N186 End stage renal disease: Secondary | ICD-10-CM | POA: Diagnosis not present

## 2016-07-31 DIAGNOSIS — D631 Anemia in chronic kidney disease: Secondary | ICD-10-CM | POA: Diagnosis not present

## 2016-08-01 DIAGNOSIS — Z992 Dependence on renal dialysis: Secondary | ICD-10-CM | POA: Diagnosis not present

## 2016-08-01 DIAGNOSIS — N186 End stage renal disease: Secondary | ICD-10-CM | POA: Diagnosis not present

## 2016-08-08 DIAGNOSIS — J449 Chronic obstructive pulmonary disease, unspecified: Secondary | ICD-10-CM | POA: Diagnosis not present

## 2016-08-14 DIAGNOSIS — N2581 Secondary hyperparathyroidism of renal origin: Secondary | ICD-10-CM | POA: Diagnosis not present

## 2016-08-27 DIAGNOSIS — Z794 Long term (current) use of insulin: Secondary | ICD-10-CM | POA: Diagnosis not present

## 2016-08-27 DIAGNOSIS — Z7984 Long term (current) use of oral hypoglycemic drugs: Secondary | ICD-10-CM | POA: Diagnosis not present

## 2016-08-27 DIAGNOSIS — E113213 Type 2 diabetes mellitus with mild nonproliferative diabetic retinopathy with macular edema, bilateral: Secondary | ICD-10-CM | POA: Diagnosis not present

## 2016-08-27 DIAGNOSIS — E1165 Type 2 diabetes mellitus with hyperglycemia: Secondary | ICD-10-CM | POA: Diagnosis not present

## 2016-08-28 DIAGNOSIS — D509 Iron deficiency anemia, unspecified: Secondary | ICD-10-CM | POA: Diagnosis not present

## 2016-08-28 DIAGNOSIS — N186 End stage renal disease: Secondary | ICD-10-CM | POA: Diagnosis not present

## 2016-08-28 DIAGNOSIS — E785 Hyperlipidemia, unspecified: Secondary | ICD-10-CM | POA: Diagnosis not present

## 2016-08-28 DIAGNOSIS — Z992 Dependence on renal dialysis: Secondary | ICD-10-CM | POA: Diagnosis not present

## 2016-09-01 DIAGNOSIS — N186 End stage renal disease: Secondary | ICD-10-CM | POA: Diagnosis not present

## 2016-09-01 DIAGNOSIS — Z992 Dependence on renal dialysis: Secondary | ICD-10-CM | POA: Diagnosis not present

## 2016-09-08 DIAGNOSIS — J449 Chronic obstructive pulmonary disease, unspecified: Secondary | ICD-10-CM | POA: Diagnosis not present

## 2016-09-25 DIAGNOSIS — D509 Iron deficiency anemia, unspecified: Secondary | ICD-10-CM | POA: Diagnosis not present

## 2016-09-25 DIAGNOSIS — N186 End stage renal disease: Secondary | ICD-10-CM | POA: Diagnosis not present

## 2016-09-25 DIAGNOSIS — Z992 Dependence on renal dialysis: Secondary | ICD-10-CM | POA: Diagnosis not present

## 2016-10-01 DIAGNOSIS — N186 End stage renal disease: Secondary | ICD-10-CM | POA: Diagnosis not present

## 2016-10-01 DIAGNOSIS — Z992 Dependence on renal dialysis: Secondary | ICD-10-CM | POA: Diagnosis not present

## 2016-10-08 DIAGNOSIS — J449 Chronic obstructive pulmonary disease, unspecified: Secondary | ICD-10-CM | POA: Diagnosis not present

## 2016-10-25 DIAGNOSIS — S199XXA Unspecified injury of neck, initial encounter: Secondary | ICD-10-CM | POA: Diagnosis not present

## 2016-10-25 DIAGNOSIS — S098XXA Other specified injuries of head, initial encounter: Secondary | ICD-10-CM | POA: Diagnosis not present

## 2016-10-25 DIAGNOSIS — H109 Unspecified conjunctivitis: Secondary | ICD-10-CM | POA: Diagnosis not present

## 2016-10-25 DIAGNOSIS — S299XXA Unspecified injury of thorax, initial encounter: Secondary | ICD-10-CM | POA: Diagnosis not present

## 2016-10-25 DIAGNOSIS — S0990XA Unspecified injury of head, initial encounter: Secondary | ICD-10-CM | POA: Diagnosis not present

## 2016-10-25 DIAGNOSIS — N186 End stage renal disease: Secondary | ICD-10-CM | POA: Diagnosis not present

## 2016-10-25 DIAGNOSIS — W19XXXA Unspecified fall, initial encounter: Secondary | ICD-10-CM | POA: Diagnosis not present

## 2016-10-25 DIAGNOSIS — Z992 Dependence on renal dialysis: Secondary | ICD-10-CM | POA: Diagnosis not present

## 2016-10-25 DIAGNOSIS — S0181XA Laceration without foreign body of other part of head, initial encounter: Secondary | ICD-10-CM | POA: Diagnosis not present

## 2016-10-30 DIAGNOSIS — Z992 Dependence on renal dialysis: Secondary | ICD-10-CM | POA: Diagnosis not present

## 2016-10-30 DIAGNOSIS — D509 Iron deficiency anemia, unspecified: Secondary | ICD-10-CM | POA: Diagnosis not present

## 2016-10-30 DIAGNOSIS — N186 End stage renal disease: Secondary | ICD-10-CM | POA: Diagnosis not present

## 2016-11-01 DIAGNOSIS — N186 End stage renal disease: Secondary | ICD-10-CM | POA: Diagnosis not present

## 2016-11-01 DIAGNOSIS — Z992 Dependence on renal dialysis: Secondary | ICD-10-CM | POA: Diagnosis not present

## 2016-11-08 DIAGNOSIS — J449 Chronic obstructive pulmonary disease, unspecified: Secondary | ICD-10-CM | POA: Diagnosis not present

## 2016-11-09 DIAGNOSIS — Z955 Presence of coronary angioplasty implant and graft: Secondary | ICD-10-CM | POA: Diagnosis not present

## 2016-11-09 DIAGNOSIS — I252 Old myocardial infarction: Secondary | ICD-10-CM | POA: Diagnosis not present

## 2016-11-09 DIAGNOSIS — I2510 Atherosclerotic heart disease of native coronary artery without angina pectoris: Secondary | ICD-10-CM | POA: Diagnosis not present

## 2016-11-09 DIAGNOSIS — I428 Other cardiomyopathies: Secondary | ICD-10-CM | POA: Diagnosis not present

## 2016-11-13 DIAGNOSIS — N2581 Secondary hyperparathyroidism of renal origin: Secondary | ICD-10-CM | POA: Diagnosis not present

## 2016-11-14 DIAGNOSIS — I10 Essential (primary) hypertension: Secondary | ICD-10-CM | POA: Diagnosis not present

## 2016-11-14 DIAGNOSIS — N186 End stage renal disease: Secondary | ICD-10-CM | POA: Diagnosis not present

## 2016-11-14 DIAGNOSIS — Z6830 Body mass index (BMI) 30.0-30.9, adult: Secondary | ICD-10-CM | POA: Diagnosis not present

## 2016-11-14 DIAGNOSIS — S08811S Complete traumatic amputation of nose, sequela: Secondary | ICD-10-CM | POA: Diagnosis not present

## 2016-11-19 DIAGNOSIS — Z992 Dependence on renal dialysis: Secondary | ICD-10-CM | POA: Diagnosis not present

## 2016-11-19 DIAGNOSIS — N186 End stage renal disease: Secondary | ICD-10-CM | POA: Diagnosis not present

## 2016-11-27 DIAGNOSIS — D509 Iron deficiency anemia, unspecified: Secondary | ICD-10-CM | POA: Diagnosis not present

## 2016-11-27 DIAGNOSIS — Z992 Dependence on renal dialysis: Secondary | ICD-10-CM | POA: Diagnosis not present

## 2016-11-27 DIAGNOSIS — N186 End stage renal disease: Secondary | ICD-10-CM | POA: Diagnosis not present

## 2016-11-27 DIAGNOSIS — E785 Hyperlipidemia, unspecified: Secondary | ICD-10-CM | POA: Diagnosis not present

## 2016-11-28 DIAGNOSIS — C319 Malignant neoplasm of accessory sinus, unspecified: Secondary | ICD-10-CM | POA: Diagnosis not present

## 2016-11-28 DIAGNOSIS — C300 Malignant neoplasm of nasal cavity: Secondary | ICD-10-CM | POA: Diagnosis not present

## 2016-11-28 DIAGNOSIS — Z9889 Other specified postprocedural states: Secondary | ICD-10-CM | POA: Diagnosis not present

## 2016-11-28 DIAGNOSIS — J432 Centrilobular emphysema: Secondary | ICD-10-CM | POA: Diagnosis not present

## 2016-11-28 DIAGNOSIS — R911 Solitary pulmonary nodule: Secondary | ICD-10-CM | POA: Diagnosis not present

## 2016-12-01 DIAGNOSIS — Z992 Dependence on renal dialysis: Secondary | ICD-10-CM | POA: Diagnosis not present

## 2016-12-01 DIAGNOSIS — N186 End stage renal disease: Secondary | ICD-10-CM | POA: Diagnosis not present

## 2016-12-06 DIAGNOSIS — N2581 Secondary hyperparathyroidism of renal origin: Secondary | ICD-10-CM | POA: Diagnosis not present

## 2016-12-08 DIAGNOSIS — J449 Chronic obstructive pulmonary disease, unspecified: Secondary | ICD-10-CM | POA: Diagnosis not present

## 2016-12-11 DIAGNOSIS — N2581 Secondary hyperparathyroidism of renal origin: Secondary | ICD-10-CM | POA: Diagnosis not present

## 2016-12-17 DIAGNOSIS — E1129 Type 2 diabetes mellitus with other diabetic kidney complication: Secondary | ICD-10-CM | POA: Diagnosis not present

## 2016-12-17 DIAGNOSIS — K219 Gastro-esophageal reflux disease without esophagitis: Secondary | ICD-10-CM | POA: Diagnosis not present

## 2016-12-17 DIAGNOSIS — Z6831 Body mass index (BMI) 31.0-31.9, adult: Secondary | ICD-10-CM | POA: Diagnosis not present

## 2016-12-17 DIAGNOSIS — E063 Autoimmune thyroiditis: Secondary | ICD-10-CM | POA: Diagnosis not present

## 2016-12-25 DIAGNOSIS — Z992 Dependence on renal dialysis: Secondary | ICD-10-CM | POA: Diagnosis not present

## 2016-12-25 DIAGNOSIS — N186 End stage renal disease: Secondary | ICD-10-CM | POA: Diagnosis not present

## 2017-01-01 DIAGNOSIS — N186 End stage renal disease: Secondary | ICD-10-CM | POA: Diagnosis not present

## 2017-01-01 DIAGNOSIS — Z992 Dependence on renal dialysis: Secondary | ICD-10-CM | POA: Diagnosis not present

## 2017-01-08 DIAGNOSIS — J449 Chronic obstructive pulmonary disease, unspecified: Secondary | ICD-10-CM | POA: Diagnosis not present

## 2017-01-16 ENCOUNTER — Encounter: Payer: Self-pay | Admitting: Internal Medicine

## 2017-01-23 DIAGNOSIS — M79672 Pain in left foot: Secondary | ICD-10-CM | POA: Diagnosis not present

## 2017-01-23 DIAGNOSIS — E1140 Type 2 diabetes mellitus with diabetic neuropathy, unspecified: Secondary | ICD-10-CM | POA: Diagnosis not present

## 2017-01-23 DIAGNOSIS — M79671 Pain in right foot: Secondary | ICD-10-CM | POA: Diagnosis not present

## 2017-01-23 DIAGNOSIS — B351 Tinea unguium: Secondary | ICD-10-CM | POA: Diagnosis not present

## 2017-01-29 DIAGNOSIS — N186 End stage renal disease: Secondary | ICD-10-CM | POA: Diagnosis not present

## 2017-01-29 DIAGNOSIS — Z992 Dependence on renal dialysis: Secondary | ICD-10-CM | POA: Diagnosis not present

## 2017-01-31 DIAGNOSIS — N186 End stage renal disease: Secondary | ICD-10-CM | POA: Diagnosis not present

## 2017-01-31 DIAGNOSIS — Z992 Dependence on renal dialysis: Secondary | ICD-10-CM | POA: Diagnosis not present

## 2017-02-08 DIAGNOSIS — J449 Chronic obstructive pulmonary disease, unspecified: Secondary | ICD-10-CM | POA: Diagnosis not present

## 2017-02-12 DIAGNOSIS — N2581 Secondary hyperparathyroidism of renal origin: Secondary | ICD-10-CM | POA: Diagnosis not present

## 2017-02-26 DIAGNOSIS — D509 Iron deficiency anemia, unspecified: Secondary | ICD-10-CM | POA: Diagnosis not present

## 2017-02-26 DIAGNOSIS — N186 End stage renal disease: Secondary | ICD-10-CM | POA: Diagnosis not present

## 2017-02-26 DIAGNOSIS — Z992 Dependence on renal dialysis: Secondary | ICD-10-CM | POA: Diagnosis not present

## 2017-02-26 DIAGNOSIS — E785 Hyperlipidemia, unspecified: Secondary | ICD-10-CM | POA: Diagnosis not present

## 2017-03-03 DIAGNOSIS — Z992 Dependence on renal dialysis: Secondary | ICD-10-CM | POA: Diagnosis not present

## 2017-03-03 DIAGNOSIS — N186 End stage renal disease: Secondary | ICD-10-CM | POA: Diagnosis not present

## 2017-03-10 DIAGNOSIS — J449 Chronic obstructive pulmonary disease, unspecified: Secondary | ICD-10-CM | POA: Diagnosis not present

## 2017-03-29 DIAGNOSIS — J449 Chronic obstructive pulmonary disease, unspecified: Secondary | ICD-10-CM | POA: Diagnosis not present

## 2017-03-29 DIAGNOSIS — I5032 Chronic diastolic (congestive) heart failure: Secondary | ICD-10-CM | POA: Diagnosis not present

## 2017-03-29 DIAGNOSIS — R201 Hypoesthesia of skin: Secondary | ICD-10-CM | POA: Diagnosis not present

## 2017-03-29 DIAGNOSIS — Z6831 Body mass index (BMI) 31.0-31.9, adult: Secondary | ICD-10-CM | POA: Diagnosis not present

## 2017-04-02 DIAGNOSIS — Z992 Dependence on renal dialysis: Secondary | ICD-10-CM | POA: Diagnosis not present

## 2017-04-02 DIAGNOSIS — N186 End stage renal disease: Secondary | ICD-10-CM | POA: Diagnosis not present

## 2017-04-08 DIAGNOSIS — E113393 Type 2 diabetes mellitus with moderate nonproliferative diabetic retinopathy without macular edema, bilateral: Secondary | ICD-10-CM | POA: Diagnosis not present

## 2017-04-08 DIAGNOSIS — Z7984 Long term (current) use of oral hypoglycemic drugs: Secondary | ICD-10-CM | POA: Diagnosis not present

## 2017-04-08 DIAGNOSIS — Z794 Long term (current) use of insulin: Secondary | ICD-10-CM | POA: Diagnosis not present

## 2017-04-08 DIAGNOSIS — E1165 Type 2 diabetes mellitus with hyperglycemia: Secondary | ICD-10-CM | POA: Diagnosis not present

## 2017-04-10 DIAGNOSIS — J449 Chronic obstructive pulmonary disease, unspecified: Secondary | ICD-10-CM | POA: Diagnosis not present

## 2017-04-15 DIAGNOSIS — R9431 Abnormal electrocardiogram [ECG] [EKG]: Secondary | ICD-10-CM | POA: Diagnosis not present

## 2017-04-15 DIAGNOSIS — R918 Other nonspecific abnormal finding of lung field: Secondary | ICD-10-CM | POA: Diagnosis not present

## 2017-04-15 DIAGNOSIS — R112 Nausea with vomiting, unspecified: Secondary | ICD-10-CM | POA: Diagnosis not present

## 2017-04-15 DIAGNOSIS — R197 Diarrhea, unspecified: Secondary | ICD-10-CM | POA: Diagnosis not present

## 2017-04-17 DIAGNOSIS — R079 Chest pain, unspecified: Secondary | ICD-10-CM | POA: Diagnosis not present

## 2017-04-17 DIAGNOSIS — R1110 Vomiting, unspecified: Secondary | ICD-10-CM | POA: Diagnosis not present

## 2017-04-17 DIAGNOSIS — R112 Nausea with vomiting, unspecified: Secondary | ICD-10-CM | POA: Diagnosis not present

## 2017-04-17 DIAGNOSIS — R197 Diarrhea, unspecified: Secondary | ICD-10-CM | POA: Diagnosis not present

## 2017-04-17 DIAGNOSIS — J449 Chronic obstructive pulmonary disease, unspecified: Secondary | ICD-10-CM | POA: Diagnosis not present

## 2017-04-17 DIAGNOSIS — I10 Essential (primary) hypertension: Secondary | ICD-10-CM | POA: Diagnosis not present

## 2017-04-17 DIAGNOSIS — J189 Pneumonia, unspecified organism: Secondary | ICD-10-CM | POA: Diagnosis not present

## 2017-04-17 DIAGNOSIS — Z992 Dependence on renal dialysis: Secondary | ICD-10-CM | POA: Diagnosis not present

## 2017-04-17 DIAGNOSIS — N186 End stage renal disease: Secondary | ICD-10-CM | POA: Diagnosis not present

## 2017-04-18 DIAGNOSIS — R1110 Vomiting, unspecified: Secondary | ICD-10-CM | POA: Diagnosis not present

## 2017-04-18 DIAGNOSIS — N186 End stage renal disease: Secondary | ICD-10-CM | POA: Diagnosis not present

## 2017-04-18 DIAGNOSIS — R112 Nausea with vomiting, unspecified: Secondary | ICD-10-CM | POA: Diagnosis not present

## 2017-04-18 DIAGNOSIS — R197 Diarrhea, unspecified: Secondary | ICD-10-CM | POA: Diagnosis not present

## 2017-04-18 DIAGNOSIS — Z992 Dependence on renal dialysis: Secondary | ICD-10-CM | POA: Diagnosis not present

## 2017-04-18 DIAGNOSIS — J449 Chronic obstructive pulmonary disease, unspecified: Secondary | ICD-10-CM | POA: Diagnosis not present

## 2017-04-18 DIAGNOSIS — I10 Essential (primary) hypertension: Secondary | ICD-10-CM | POA: Diagnosis not present

## 2017-04-19 DIAGNOSIS — R112 Nausea with vomiting, unspecified: Secondary | ICD-10-CM | POA: Diagnosis not present

## 2017-04-19 DIAGNOSIS — R197 Diarrhea, unspecified: Secondary | ICD-10-CM | POA: Diagnosis not present

## 2017-04-19 DIAGNOSIS — N186 End stage renal disease: Secondary | ICD-10-CM | POA: Diagnosis not present

## 2017-04-19 DIAGNOSIS — Z992 Dependence on renal dialysis: Secondary | ICD-10-CM | POA: Diagnosis not present

## 2017-04-19 DIAGNOSIS — R1110 Vomiting, unspecified: Secondary | ICD-10-CM | POA: Diagnosis not present

## 2017-04-19 DIAGNOSIS — J449 Chronic obstructive pulmonary disease, unspecified: Secondary | ICD-10-CM | POA: Diagnosis not present

## 2017-04-19 DIAGNOSIS — I10 Essential (primary) hypertension: Secondary | ICD-10-CM | POA: Diagnosis not present

## 2017-04-22 DIAGNOSIS — E113511 Type 2 diabetes mellitus with proliferative diabetic retinopathy with macular edema, right eye: Secondary | ICD-10-CM | POA: Diagnosis not present

## 2017-04-24 ENCOUNTER — Other Ambulatory Visit (HOSPITAL_COMMUNITY): Payer: Self-pay | Admitting: Internal Medicine

## 2017-04-24 DIAGNOSIS — J449 Chronic obstructive pulmonary disease, unspecified: Secondary | ICD-10-CM | POA: Diagnosis not present

## 2017-04-24 DIAGNOSIS — Z6829 Body mass index (BMI) 29.0-29.9, adult: Secondary | ICD-10-CM | POA: Diagnosis not present

## 2017-04-24 DIAGNOSIS — E1165 Type 2 diabetes mellitus with hyperglycemia: Secondary | ICD-10-CM | POA: Diagnosis not present

## 2017-04-24 DIAGNOSIS — K219 Gastro-esophageal reflux disease without esophagitis: Secondary | ICD-10-CM | POA: Diagnosis not present

## 2017-04-24 DIAGNOSIS — R1310 Dysphagia, unspecified: Secondary | ICD-10-CM

## 2017-04-24 DIAGNOSIS — N185 Chronic kidney disease, stage 5: Secondary | ICD-10-CM | POA: Diagnosis not present

## 2017-04-24 DIAGNOSIS — E663 Overweight: Secondary | ICD-10-CM | POA: Diagnosis not present

## 2017-05-03 DIAGNOSIS — Z992 Dependence on renal dialysis: Secondary | ICD-10-CM | POA: Diagnosis not present

## 2017-05-03 DIAGNOSIS — N186 End stage renal disease: Secondary | ICD-10-CM | POA: Diagnosis not present

## 2017-05-10 DIAGNOSIS — J449 Chronic obstructive pulmonary disease, unspecified: Secondary | ICD-10-CM | POA: Diagnosis not present

## 2017-05-21 DIAGNOSIS — N186 End stage renal disease: Secondary | ICD-10-CM | POA: Diagnosis not present

## 2017-05-21 DIAGNOSIS — D509 Iron deficiency anemia, unspecified: Secondary | ICD-10-CM | POA: Diagnosis not present

## 2017-05-21 DIAGNOSIS — Z992 Dependence on renal dialysis: Secondary | ICD-10-CM | POA: Diagnosis not present

## 2017-05-21 DIAGNOSIS — E785 Hyperlipidemia, unspecified: Secondary | ICD-10-CM | POA: Diagnosis not present

## 2017-05-24 DIAGNOSIS — E113511 Type 2 diabetes mellitus with proliferative diabetic retinopathy with macular edema, right eye: Secondary | ICD-10-CM | POA: Diagnosis not present

## 2017-06-03 DIAGNOSIS — N186 End stage renal disease: Secondary | ICD-10-CM | POA: Diagnosis not present

## 2017-06-03 DIAGNOSIS — Z992 Dependence on renal dialysis: Secondary | ICD-10-CM | POA: Diagnosis not present

## 2017-06-10 DIAGNOSIS — J449 Chronic obstructive pulmonary disease, unspecified: Secondary | ICD-10-CM | POA: Diagnosis not present

## 2017-06-26 DIAGNOSIS — C300 Malignant neoplasm of nasal cavity: Secondary | ICD-10-CM | POA: Diagnosis not present

## 2017-06-26 DIAGNOSIS — C319 Malignant neoplasm of accessory sinus, unspecified: Secondary | ICD-10-CM | POA: Diagnosis not present

## 2017-07-04 DIAGNOSIS — N186 End stage renal disease: Secondary | ICD-10-CM | POA: Diagnosis not present

## 2017-07-04 DIAGNOSIS — Z992 Dependence on renal dialysis: Secondary | ICD-10-CM | POA: Diagnosis not present

## 2017-07-11 DIAGNOSIS — J449 Chronic obstructive pulmonary disease, unspecified: Secondary | ICD-10-CM | POA: Diagnosis not present

## 2017-07-17 DIAGNOSIS — Z6831 Body mass index (BMI) 31.0-31.9, adult: Secondary | ICD-10-CM | POA: Diagnosis not present

## 2017-07-17 DIAGNOSIS — L84 Corns and callosities: Secondary | ICD-10-CM | POA: Diagnosis not present

## 2017-07-17 DIAGNOSIS — R0902 Hypoxemia: Secondary | ICD-10-CM | POA: Diagnosis not present

## 2017-07-17 DIAGNOSIS — R0609 Other forms of dyspnea: Secondary | ICD-10-CM | POA: Diagnosis not present

## 2017-07-31 DIAGNOSIS — E1165 Type 2 diabetes mellitus with hyperglycemia: Secondary | ICD-10-CM | POA: Diagnosis not present

## 2017-08-01 DIAGNOSIS — Z992 Dependence on renal dialysis: Secondary | ICD-10-CM | POA: Diagnosis not present

## 2017-08-01 DIAGNOSIS — E1165 Type 2 diabetes mellitus with hyperglycemia: Secondary | ICD-10-CM | POA: Diagnosis not present

## 2017-08-01 DIAGNOSIS — N186 End stage renal disease: Secondary | ICD-10-CM | POA: Diagnosis not present

## 2017-08-08 DIAGNOSIS — J449 Chronic obstructive pulmonary disease, unspecified: Secondary | ICD-10-CM | POA: Diagnosis not present

## 2017-08-15 DIAGNOSIS — W1839XA Other fall on same level, initial encounter: Secondary | ICD-10-CM | POA: Diagnosis not present

## 2017-08-15 DIAGNOSIS — S5011XA Contusion of right forearm, initial encounter: Secondary | ICD-10-CM | POA: Diagnosis not present

## 2017-08-27 DIAGNOSIS — N186 End stage renal disease: Secondary | ICD-10-CM | POA: Diagnosis not present

## 2017-08-27 DIAGNOSIS — E785 Hyperlipidemia, unspecified: Secondary | ICD-10-CM | POA: Diagnosis not present

## 2017-08-27 DIAGNOSIS — Z992 Dependence on renal dialysis: Secondary | ICD-10-CM | POA: Diagnosis not present

## 2017-08-27 DIAGNOSIS — D509 Iron deficiency anemia, unspecified: Secondary | ICD-10-CM | POA: Diagnosis not present

## 2017-09-01 DIAGNOSIS — Z992 Dependence on renal dialysis: Secondary | ICD-10-CM | POA: Diagnosis not present

## 2017-09-01 DIAGNOSIS — N186 End stage renal disease: Secondary | ICD-10-CM | POA: Diagnosis not present

## 2017-09-03 DIAGNOSIS — R1111 Vomiting without nausea: Secondary | ICD-10-CM | POA: Diagnosis not present

## 2017-09-03 DIAGNOSIS — R1030 Lower abdominal pain, unspecified: Secondary | ICD-10-CM | POA: Diagnosis not present

## 2017-09-03 DIAGNOSIS — K5792 Diverticulitis of intestine, part unspecified, without perforation or abscess without bleeding: Secondary | ICD-10-CM | POA: Diagnosis not present

## 2017-09-08 DIAGNOSIS — J449 Chronic obstructive pulmonary disease, unspecified: Secondary | ICD-10-CM | POA: Diagnosis not present

## 2017-09-10 DIAGNOSIS — N2581 Secondary hyperparathyroidism of renal origin: Secondary | ICD-10-CM | POA: Diagnosis not present

## 2017-09-24 DIAGNOSIS — N186 End stage renal disease: Secondary | ICD-10-CM | POA: Diagnosis not present

## 2017-09-24 DIAGNOSIS — Z992 Dependence on renal dialysis: Secondary | ICD-10-CM | POA: Diagnosis not present

## 2017-10-01 DIAGNOSIS — N186 End stage renal disease: Secondary | ICD-10-CM | POA: Diagnosis not present

## 2017-10-01 DIAGNOSIS — Z992 Dependence on renal dialysis: Secondary | ICD-10-CM | POA: Diagnosis not present

## 2017-10-02 DIAGNOSIS — L03032 Cellulitis of left toe: Secondary | ICD-10-CM | POA: Diagnosis not present

## 2017-10-02 DIAGNOSIS — M79675 Pain in left toe(s): Secondary | ICD-10-CM | POA: Diagnosis not present

## 2017-10-02 DIAGNOSIS — L600 Ingrowing nail: Secondary | ICD-10-CM | POA: Diagnosis not present

## 2017-10-02 DIAGNOSIS — M79672 Pain in left foot: Secondary | ICD-10-CM | POA: Diagnosis not present

## 2017-10-07 ENCOUNTER — Encounter: Payer: Self-pay | Admitting: Neurology

## 2017-10-07 ENCOUNTER — Ambulatory Visit: Payer: Medicare Other | Admitting: Neurology

## 2017-10-07 VITALS — BP 123/57 | HR 79 | Ht 70.0 in | Wt 222.0 lb

## 2017-10-07 DIAGNOSIS — R251 Tremor, unspecified: Secondary | ICD-10-CM

## 2017-10-07 DIAGNOSIS — R29818 Other symptoms and signs involving the nervous system: Secondary | ICD-10-CM | POA: Diagnosis not present

## 2017-10-07 NOTE — Progress Notes (Signed)
Subjective:    Patient ID: Thomas [name] is a 77 y.o. male.  HPI     Thomas Age, MD, PhD [facility] [address] [place], [place] [address]   Dear Dr. Gerarda Fraction,   I saw your patient, Thomas [name], upon your kind request in my neurologic clinic today for initial consultation of his tremor. The patient is accompanied by his son in law today. 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 He does not have a family history of tremors. I reviewed your office note from 07/31/2017. Of note, he is on multiple medications. He has not had much in the way of medication changes but has been more consistent with his nebulizer and inhaler treatments according to son-in-law. They have been staying with daughter and son-in-law for the past 18 months or so.one time he tried his wife's Xanax and did not have any tremor but also slept extended for almost 12 hours. Tremor is intermittent, he has had difficulty holding things.He drinks alcohol rarely, he quit smoking but has been using vapor, and smokes maybe 1 cigarette every 2 months or so. He drinks caffeine in the form of coffee, 2 cups in the morning and 1 soda on average per day.  His Past Medical History Is Significant For: Past Medical History:  Diagnosis Date  . Anemia   . Arthritis    hands, back  . Cardiac arrest ([place])   . Cataract   . CHF  (congestive heart failure) ([place])   . Chronic kidney disease    CKD, hemodialysis Tues/Thurs/Sat  . COPD (chronic obstructive pulmonary disease) ([place])   . Diabetes mellitus ([place])   . Diverticulosis 2013   tcs by RMR  . Diverticulosis   . Erosive esophagitis   . GERD (gastroesophageal reflux disease)   . Hiatal hernia    pt not aware of this  . HTN (hypertension)   . Hypercholesterolemia   . Hypothyroidism   . Irritable bowel syndrome (IBS)   . Lymphocytic colitis 2013   tcs by RMR  . Myocardial infarction ([place])    X2  . Nasal mass   . Neuropathy   . Pneumonia   . Schatzki's ring   . Shortness of breath   . Sleep apnea    uses O2 only  . Tobacco abuse     His Past Surgical History Is Significant For: Past Surgical History:  Procedure Laterality Date  . AV FISTULA PLACEMENT Left 06/29/2013   Procedure: LEFT RADIAL/CEPHALIC FISTULA;  Surgeon: Conrad Morrisville, MD;  Location: [facility];  Service: Vascular;  Laterality: Left;  . cardiac stents     5  . CATARACT EXTRACTION W/PHACO Left 05/17/2014   Procedure: CATARACT EXTRACTION PHACO AND INTRAOCULAR LENS PLACEMENT (IOC);  Surgeon: Tonny Branch, MD;  Location: [facility];  Service: Ophthalmology;  Laterality: Left;  CDE:7.71  . CATARACT EXTRACTION W/PHACO Right 06/14/2014   Procedure: CATARACT EXTRACTION PHACO AND INTRAOCULAR LENS PLACEMENT RIGHT EYE;  Surgeon: Tonny Branch, MD;  Location: [facility];  Service: Ophthalmology;  Laterality: Right;  CDE:8.22  . CHOLECYSTECTOMY    . COLONOSCOPY  December 2007   adenomatous polyps, sigmoid diverticula  . COLONOSCOPY  02/06/2012   Dr. Gala Romney- colonic diverticulosis, bx consistent with lymphocytic colitis  . CORONARY ANGIOPLASTY     x5 stents  . ESOPHAGOGASTRODUODENOSCOPY  May 2009   RMR: non-critical Schatzki's ring, sp dilation with 2 F Maloney, small inlet patch, mild erosive relufx esophagitis  . ESOPHAGOGASTRODUODENOSCOPY N/A 07/15/2012   Dr. Gala Romney- hiatal hernia, duodenal ulcer with surrounding  erosions. this may well be related to the "haziness" sen around the head of the pancreas and duodenum on recent noncontrast ct scan and may have much to do with his abdominal pain.  Marland Kitchen EXCISION NASAL MASS N/A 07/18/2015   Procedure: EXCISION OF NASAL MASS;  Surgeon: Leta Baptist, MD;  Location: [facility] [place];  Service: ENT;  Laterality: N/A;  . INSERTION OF DIALYSIS CATHETER Right 05/21/2013   Procedure: INSERTION OF DIALYSIS CATHETER;  Surgeon: Conrad Honaker, MD;  Location: [facility];  Service: Vascular;  Laterality: Right;  Marland Kitchen VASECTOMY      His Family History Is Significant For: Family History  Problem Relation [name] of Onset  . Cancer Mother   . Cancer Father   . Diabetes Brother   . Cancer Brother   . Diabetes Sister   . Cancer Sister   . Cancer Sister   . Colon cancer Neg Hx     His Social History Is Significant For: Social History   Socioeconomic History  . Marital status: Married    Spouse name: Not on file  . Number of children: 3  . Years of education: Not on file  . Highest education level: Not on file  Occupational History  . 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 Financial resource strain: Not on file  . Food insecurity:    Worry: Not on file    Inability: Not on file  . Transportation needs:    Medical: Not on file    Non-medical: Not on file  Tobacco Use  . Smoking status: Former Smoker    Packs/day: 0.00    Years: 60.00    Pack years: 0.00    Types: E-cigarettes    Last attempt to quit: 11/10/2014    Years since quitting: 2.9  . Smokeless tobacco: Never Used  Substance and Sexual Activity  . Alcohol use: No  . Drug use: No  . Sexual activity: Not on file  Lifestyle  . Physical activity:    Days per week: Not on file    Minutes per session: Not on file  . Stress: Not on file  Relationships  . Social connections:    Talks on phone: Not on file    Gets together: Not on file    Attends religious service: Not on file    Active member of club or  organization: Not on file    Attends meetings of clubs or organizations: Not on file    Relationship status: Not on file  Other Topics Concern  . Not on file  Social History Narrative  . Not on file    His Allergies Are:  Allergies  Allergen Reactions  . Statins Swelling    Caused feet, tongue and throat to swell.  . Codeine Other (See Comments)    Muscle Spasms  :   His Current Medications Are:  Outpatient Encounter Medications as of 10/07/2017  Medication Sig  . albuterol (PROVENTIL,VENTOLIN) 2 MG/5ML  syrup Take 2 mg by mouth 3 (three) times daily.  Marland Kitchen amLODipine (NORVASC) 10 MG tablet Take 10 mg by mouth daily.  Marland Kitchen aspirin 325 MG tablet Take 325 mg by mouth daily.  Marland Kitchen b complex-vitamin c-folic acid (NEPHRO-VITE) 0.8 MG TABS tablet Take 1 tablet by mouth daily.  . butalbital-acetaminophen-caffeine (FIORICET, ESGIC) 50-325-40 MG per tablet Take 1 tablet by mouth every 6 (six) hours as needed for headache.   . calcium acetate (PHOSLO) 667 MG capsule Take 667-1,334 mg by mouth See admin instructions. TAKE 2 CAPS BY MOUTH 3 TIMES DAILY WITH MEALS. TAKE 1 CAP 2 TIMES DAILY WITH SNACKS  . Calcium Carb-Cholecalciferol (CALCIUM + D3 PO) Take by mouth daily.  . Cholecalciferol (VITAMIN D3 PO) Take 2 capsules by mouth 2 (two) times daily.  Marland Kitchen dicyclomine (BENTYL) 20 MG tablet Take 20 mg by mouth 4 (four) times daily as needed for spasms.   . diphenoxylate-atropine (LOMOTIL) 2.5-0.025 MG tablet Take 2 tablets by mouth 4 (four) times daily as needed for diarrhea or loose stools.  . gabapentin (NEURONTIN) 300 MG capsule Take 300 mg by mouth 3 (three) times daily.  . isosorbide mononitrate (IMDUR) 30 MG 24 hr tablet Take 30 mg by mouth daily.   [name] Oil 300 MG CAPS Take 1 capsule by mouth daily.   Marland Kitchen LANTUS SOLOSTAR 100 UNIT/ML injection Inject 60 Units into the skin at bedtime. According to sugar level.  Marland Kitchen losartan-hydrochlorothiazide (HYZAAR) 100-12.5 MG per tablet Take 1 tablet by mouth daily.    . magnesium oxide (MAG-OX) 400 MG tablet Take 400 mg by mouth daily.  . metoprolol (LOPRESSOR) 100 MG tablet Take 100 mg by mouth daily.   Marland Kitchen NITROSTAT 0.4 MG SL tablet Place 0.4 mg under the tongue every 5 (five) minutes as needed for chest pain.   Marland Kitchen ondansetron (ZOFRAN) 4 MG tablet Take 4 mg by mouth every 8 (eight) hours as needed for nausea or vomiting.  . sulfamethoxazole-trimethoprim (BACTRIM,SEPTRA) 200-40 MG/5ML suspension Take by mouth 2 (two) times daily.  Marland Kitchen sulfamethoxazole-trimethoprim (BACTRIM,SEPTRA) 400-80 MG tablet Take 1 tablet by mouth every 12 (twelve) hours.  . [DISCONTINUED] acetaminophen (TYLENOL) 500 MG tablet Take 500 mg by mouth every 6 (six) hours as needed for headache.  . [DISCONTINUED] calcium-vitamin D (OSCAL WITH D) 500-200 MG-UNIT per tablet Take 1 tablet by mouth daily with breakfast.  . [DISCONTINUED] cholecalciferol (VITAMIN D) 1000 UNITS tablet Take 1,000 Units by mouth daily.   . [DISCONTINUED] Cyanocobalamin (VITAMIN B-12 PO) Take 1 tablet by mouth 2 (two) times daily.  . [DISCONTINUED] diphenhydrAMINE (BENADRYL) 25 MG tablet Take 25 mg by mouth every 6 (six) hours as needed.  . [DISCONTINUED] gemfibrozil (LOPID) 600 MG tablet Take 600 mg by mouth daily.   . [DISCONTINUED] hydrOXYzine (ATARAX/VISTARIL) 25 MG tablet Take 25 mg by mouth every 6 (six) hours as needed.  . [DISCONTINUED] hyoscyamine (LEVSIN, ANASPAZ) 0.125 MG tablet Take 0.125 mg by mouth 3 (three) times daily as needed for bladder spasms or cramping.   . [DISCONTINUED] meclizine (ANTIVERT) 25 MG tablet Take 25 mg by mouth every 6 (six) hours as needed for dizziness.  . [DISCONTINUED] Methylcellulose, Laxative, (FIBER THERAPY PO) Take 1 tablet by mouth 2 (two) times daily.  . [DISCONTINUED] omeprazole (PRILOSEC) 20 MG capsule Take 20 mg by mouth 2 (two) times daily before a meal.  . [DISCONTINUED] ondansetron (ZOFRAN) 8 MG tablet Take 8 mg by mouth every 6 (six) hours as needed for nausea or  vomiting.   . [  DISCONTINUED] pravastatin (PRAVACHOL) 80 MG tablet Take 80 mg by mouth daily.  . [DISCONTINUED] ranitidine (ZANTAC) 150 MG capsule Take 150 mg by mouth at bedtime.  . [DISCONTINUED] tiotropium (SPIRIVA) 18 MCG inhalation capsule Place 18 mcg into inhaler and inhale daily.  . [DISCONTINUED] VENTOLIN HFA 108 (90 BASE) MCG/ACT inhaler Inhale 2 puffs into the lungs every 6 (six) hours as needed for wheezing or shortness of breath.   . [DISCONTINUED] vitamin C (ASCORBIC ACID) 500 MG tablet Take 500 mg by mouth 2 (two) times daily.   No facility-administered encounter medications on file as of 10/07/2017.   :   Review of Systems:  Out of a complete 14 point review of systems, all are reviewed and negative with the exception of these symptoms as listed below: Review of Systems  Neurological:       Pt presents today to discuss his tremors. Pt has intermittent full body tremors that he notices when he lays down. Pt is right handed.    Objective:  Neurological Exam  Physical Exam Physical Examination:   Vitals:   10/07/17 1412  BP: (!) 123/57  Pulse: 79   General Examination: The patient is a very pleasant 77 y.o. male in no acute distress. He appears well-developed and well-nourished and well groomed.   HEENT: Normocephalic, s/p rhinectomy and surgical mask in place. Extraocular tracking is good without limitation to gaze excursion or nystagmus noted. Normal smooth pursuit is noted. Hearing is grossly intact. Face is symmetric with normal facial animation and normal facial sensation. Speech is clear with no dysarthria noted. There is no hypophonia. There is no lip, neck/head, jaw or voice tremor. Neck is supple with full range of passive and active motion. Oropharynx exam reveals: moderate mouth dryness, adequate dental hygiene with full dentures. Tongue protrudes centrally and palate elevates symmetrically.  Chest: Clear to auscultation without wheezing, rhonchi or crackles  noted.  Heart: S1+S2+0, regular and normal without murmurs, rubs or gallops noted.   Abdomen: Soft, non-tender and non-distended with normal bowel sounds appreciated on auscultation.  Extremities: There is 1+ pitting edema in the distal lower extremities bilaterally. Dialysis access fistula left forearm.  Skin: Warm and dry without trophic changes noted.  Musculoskeletal: exam reveals no obvious joint deformities, tenderness or joint swelling or erythema.   Neurologically:  Mental status: The patient is awake, alert and oriented in all 4 spheres. His immediate and remote memory, attention, language skills and fund of knowledge are appropriate. There is no evidence of aphasia, agnosia, apraxia or anomia. Speech is clear with normal prosody and enunciation. Thought process is linear. Mood is normal and affect is normal.  Cranial nerves II - XII are as described above under HEENT exam. In addition: shoulder shrug is normal with equal shoulder height noted. Motor exam: Normal bulk, strength and tone is noted. There is no drift or resting tremor.  On 10/07/2017: on Archimedes spiral drawing he has mild insecurity with right and left hand, handwriting with the right hand is difficult to read, not particularly tremulous, not micrographic. He has had intermittent slight postural and action tremor in both upper extremities, no lower extremity tremor. No orthostatic tremor. Romberg is not tested d/t safety concerns. Fine motor skills and coordination: intact with normal finger taps, normal hand movements, normal rapid alternating patting, normal foot taps and normal foot agility.  Cerebellar testing: No dysmetria or intention tremor on finger to nose testing. There is no truncal or gait ataxia.  Sensory exam: intact to light  touch.  Gait, station and balance: He stands with difficulty. No veering to one side is noted. No leaning to one side is noted. Posture is [name]-appropriate but stands wide based. He walks  slowly and cautiously without his cane, he is more secure with his cane.  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 On examination, he has no signs of parkinsonism and history is not suggestive of essential tremor. His intermittent tremor could be secondary to medication effect from his nebulizer and inhaler treatments, tendency towards dehydration, caffeine consumption, stress. I did not suggest any symptomatic treatment for his tremor. I would like to proceed with a brain MRI without contrast, given his end-stage renal disease we will do a non-contrasted MRI. We will call him with his test results. In addition, I would like to proceed with an EEG and we can call him with the results, so long as his test results are [name]-appropriate I will see him back on an as-needed basis.  I answered all their questions today and the patient and his son-in-law were in agreement. Thank you very much for allowing me to participate in the care of this nice patient. If I can be of any further assistance to you please do not hesitate to call me at [phone].  Sincerely,   Thomas Age, MD, PhD

## 2017-10-07 NOTE — Patient Instructions (Addendum)
You have an intermittent tremor, which is possibly due to medication effect, dehydration, stress, caffeine.   I would not recommend any new medication for this.  I do not see any signs of parkinsonism which is good.   As discussed, we will do a brain scan, called MRI and call you with the test results. We will have to schedule you for this on a separate date. This test requires authorization from your insurance, and we will take care of the insurance process.  We will do an EEG (brainwave test), which we will schedule. We will call you with the results.  I can see you back as needed.

## 2017-10-08 ENCOUNTER — Telehealth: Payer: Self-pay | Admitting: Neurology

## 2017-10-08 DIAGNOSIS — J449 Chronic obstructive pulmonary disease, unspecified: Secondary | ICD-10-CM | POA: Diagnosis not present

## 2017-10-08 NOTE — Telephone Encounter (Signed)
[employer] Josem Kaufmann: [id number] (exp. 10/08/17 to 11/06/17) order sent to GI. They will reach out to the patient to schedule.

## 2017-10-15 DIAGNOSIS — N2581 Secondary hyperparathyroidism of renal origin: Secondary | ICD-10-CM | POA: Diagnosis not present

## 2017-10-16 DIAGNOSIS — M79671 Pain in right foot: Secondary | ICD-10-CM | POA: Diagnosis not present

## 2017-10-16 DIAGNOSIS — L600 Ingrowing nail: Secondary | ICD-10-CM | POA: Diagnosis not present

## 2017-10-16 DIAGNOSIS — E1140 Type 2 diabetes mellitus with diabetic neuropathy, unspecified: Secondary | ICD-10-CM | POA: Diagnosis not present

## 2017-10-16 DIAGNOSIS — M79674 Pain in right toe(s): Secondary | ICD-10-CM | POA: Diagnosis not present

## 2017-10-29 DIAGNOSIS — Z992 Dependence on renal dialysis: Secondary | ICD-10-CM | POA: Diagnosis not present

## 2017-10-29 DIAGNOSIS — N186 End stage renal disease: Secondary | ICD-10-CM | POA: Diagnosis not present

## 2017-10-30 ENCOUNTER — Ambulatory Visit
Admission: RE | Admit: 2017-10-30 | Discharge: 2017-10-30 | Disposition: A | Discharge status: Home / Self Care | Payer: Medicare Other | Source: Ambulatory Visit | Attending: Neurology | Admitting: Neurology

## 2017-10-30 DIAGNOSIS — R29818 Other symptoms and signs involving the nervous system: Secondary | ICD-10-CM

## 2017-10-30 DIAGNOSIS — R251 Tremor, unspecified: Secondary | ICD-10-CM | POA: Diagnosis not present

## 2017-10-31 ENCOUNTER — Telehealth: Payer: Self-pay

## 2017-10-31 NOTE — Progress Notes (Signed)
Please call patient regarding the recent brain MRI: The brain scan showed a normal structure of the brain and mild volume loss which we call atrophy. There were changes in the deeper structures of the brain, which we call white matter changes or microvascular changes. These were reported as mild in His case. These are tiny white spots, that occur with time and are seen in a variety of conditions, including with normal aging, chronic hypertension, chronic headaches, especially migraine HAs, chronic diabetes, chronic hyperlipidemia. These are not strokes and no mass or lesion were seen which is reassuring. Again, there were no acute findings, such as a stroke, or mass or blood products. Compared to his MRI from 3 years ago, there has been mild progression of his atrophy/volume loss.  No further action is required on this test at this time, other than re-enforcing the importance of good blood pressure control, good cholesterol control, good blood sugar control, and weight management. Please remind patient to keep any upcoming appointments or tests and to call us with any interim questions, concerns, problems or updates. Thanks,  EEG pending for next week.  Star Age, MD, PhD

## 2017-10-31 NOTE — Telephone Encounter (Signed)
I called pt and explained his MRI results. I reminded pt of his EEG appt. Pt verbalized understanding of results. Pt had no questions at this time but was encouraged to call back if questions arise.

## 2017-10-31 NOTE — Telephone Encounter (Signed)
-----   Message from Star Age, MD sent at 10/31/2017  5:03 PM EDT ----- Please call patient regarding the recent brain MRI: The brain scan showed a normal structure of the brain and mild volume loss which we call atrophy. There were changes in the deeper structures of the brain, which we call white matter changes or microvascular changes. These were reported as mild in His case. These are tiny white spots, that occur with time and are seen in a variety of conditions, including with normal aging, chronic hypertension, chronic headaches, especially migraine HAs, chronic diabetes, chronic hyperlipidemia. These are not strokes and no mass or lesion were seen which is reassuring. Again, there were no acute findings, such as a stroke, or mass or blood products. Compared to his MRI from 3 years ago, there has been mild progression of his atrophy/volume loss.  No further action is required on this test at this time, other than re-enforcing the importance of good blood pressure control, good cholesterol control, good blood sugar control, and weight management. Please remind patient to keep any upcoming appointments or tests and to call us with any interim questions, concerns, problems or updates. Thanks,  EEG pending for next week.  Star Age, MD, PhD

## 2017-11-01 DIAGNOSIS — Z992 Dependence on renal dialysis: Secondary | ICD-10-CM | POA: Diagnosis not present

## 2017-11-01 DIAGNOSIS — N186 End stage renal disease: Secondary | ICD-10-CM | POA: Diagnosis not present

## 2017-11-06 ENCOUNTER — Other Ambulatory Visit: Payer: Medicare Other

## 2017-11-08 DIAGNOSIS — J449 Chronic obstructive pulmonary disease, unspecified: Secondary | ICD-10-CM | POA: Diagnosis not present

## 2017-11-12 DIAGNOSIS — N2581 Secondary hyperparathyroidism of renal origin: Secondary | ICD-10-CM | POA: Diagnosis not present

## 2017-11-13 DIAGNOSIS — L03032 Cellulitis of left toe: Secondary | ICD-10-CM | POA: Diagnosis not present

## 2017-11-13 DIAGNOSIS — Z6832 Body mass index (BMI) 32.0-32.9, adult: Secondary | ICD-10-CM | POA: Diagnosis not present

## 2017-11-13 DIAGNOSIS — E1140 Type 2 diabetes mellitus with diabetic neuropathy, unspecified: Secondary | ICD-10-CM | POA: Diagnosis not present

## 2017-11-13 DIAGNOSIS — Z0001 Encounter for general adult medical examination with abnormal findings: Secondary | ICD-10-CM | POA: Diagnosis not present

## 2017-11-13 DIAGNOSIS — N185 Chronic kidney disease, stage 5: Secondary | ICD-10-CM | POA: Diagnosis not present

## 2017-11-13 DIAGNOSIS — L600 Ingrowing nail: Secondary | ICD-10-CM | POA: Diagnosis not present

## 2017-11-13 DIAGNOSIS — M79672 Pain in left foot: Secondary | ICD-10-CM | POA: Diagnosis not present

## 2017-11-13 DIAGNOSIS — K5190 Ulcerative colitis, unspecified, without complications: Secondary | ICD-10-CM | POA: Diagnosis not present

## 2017-11-13 DIAGNOSIS — Z1389 Encounter for screening for other disorder: Secondary | ICD-10-CM | POA: Diagnosis not present

## 2017-11-14 DIAGNOSIS — R0902 Hypoxemia: Secondary | ICD-10-CM | POA: Diagnosis not present

## 2017-11-14 DIAGNOSIS — I700 Atherosclerosis of aorta: Secondary | ICD-10-CM | POA: Diagnosis not present

## 2017-11-14 DIAGNOSIS — R7989 Other specified abnormal findings of blood chemistry: Secondary | ICD-10-CM | POA: Diagnosis not present

## 2017-11-14 DIAGNOSIS — R4182 Altered mental status, unspecified: Secondary | ICD-10-CM | POA: Diagnosis not present

## 2017-11-14 DIAGNOSIS — J9811 Atelectasis: Secondary | ICD-10-CM | POA: Diagnosis not present

## 2017-11-14 DIAGNOSIS — I10 Essential (primary) hypertension: Secondary | ICD-10-CM | POA: Diagnosis not present

## 2017-11-14 DIAGNOSIS — R197 Diarrhea, unspecified: Secondary | ICD-10-CM | POA: Diagnosis not present

## 2017-11-14 DIAGNOSIS — R531 Weakness: Secondary | ICD-10-CM | POA: Diagnosis not present

## 2017-11-14 DIAGNOSIS — N186 End stage renal disease: Secondary | ICD-10-CM | POA: Diagnosis not present

## 2017-11-14 DIAGNOSIS — I2510 Atherosclerotic heart disease of native coronary artery without angina pectoris: Secondary | ICD-10-CM | POA: Diagnosis not present

## 2017-11-14 DIAGNOSIS — R55 Syncope and collapse: Secondary | ICD-10-CM | POA: Diagnosis not present

## 2017-11-15 DIAGNOSIS — R197 Diarrhea, unspecified: Secondary | ICD-10-CM | POA: Diagnosis not present

## 2017-11-15 DIAGNOSIS — R112 Nausea with vomiting, unspecified: Secondary | ICD-10-CM | POA: Diagnosis not present

## 2017-11-15 DIAGNOSIS — J449 Chronic obstructive pulmonary disease, unspecified: Secondary | ICD-10-CM | POA: Diagnosis not present

## 2017-11-15 DIAGNOSIS — R55 Syncope and collapse: Secondary | ICD-10-CM | POA: Diagnosis not present

## 2017-11-15 DIAGNOSIS — I10 Essential (primary) hypertension: Secondary | ICD-10-CM | POA: Diagnosis not present

## 2017-11-15 DIAGNOSIS — Z992 Dependence on renal dialysis: Secondary | ICD-10-CM | POA: Diagnosis not present

## 2017-11-15 DIAGNOSIS — N186 End stage renal disease: Secondary | ICD-10-CM | POA: Diagnosis not present

## 2017-11-16 DIAGNOSIS — R55 Syncope and collapse: Secondary | ICD-10-CM | POA: Diagnosis not present

## 2017-11-16 DIAGNOSIS — R197 Diarrhea, unspecified: Secondary | ICD-10-CM | POA: Diagnosis not present

## 2017-11-16 DIAGNOSIS — D631 Anemia in chronic kidney disease: Secondary | ICD-10-CM | POA: Diagnosis not present

## 2017-11-16 DIAGNOSIS — E119 Type 2 diabetes mellitus without complications: Secondary | ICD-10-CM | POA: Diagnosis not present

## 2017-11-16 DIAGNOSIS — N186 End stage renal disease: Secondary | ICD-10-CM | POA: Diagnosis not present

## 2017-11-16 DIAGNOSIS — Z992 Dependence on renal dialysis: Secondary | ICD-10-CM | POA: Diagnosis not present

## 2017-11-17 DIAGNOSIS — R55 Syncope and collapse: Secondary | ICD-10-CM | POA: Diagnosis not present

## 2017-11-17 DIAGNOSIS — Z992 Dependence on renal dialysis: Secondary | ICD-10-CM | POA: Diagnosis not present

## 2017-11-17 DIAGNOSIS — R197 Diarrhea, unspecified: Secondary | ICD-10-CM | POA: Diagnosis not present

## 2017-11-17 DIAGNOSIS — E119 Type 2 diabetes mellitus without complications: Secondary | ICD-10-CM | POA: Diagnosis not present

## 2017-11-17 DIAGNOSIS — N186 End stage renal disease: Secondary | ICD-10-CM | POA: Diagnosis not present

## 2017-11-18 ENCOUNTER — Other Ambulatory Visit: Payer: Medicare Other

## 2017-11-19 DIAGNOSIS — A020 Salmonella enteritis: Secondary | ICD-10-CM | POA: Diagnosis not present

## 2017-11-19 DIAGNOSIS — R531 Weakness: Secondary | ICD-10-CM | POA: Diagnosis not present

## 2017-11-19 DIAGNOSIS — I959 Hypotension, unspecified: Secondary | ICD-10-CM | POA: Diagnosis not present

## 2017-11-19 DIAGNOSIS — R197 Diarrhea, unspecified: Secondary | ICD-10-CM | POA: Diagnosis not present

## 2017-11-19 DIAGNOSIS — R0602 Shortness of breath: Secondary | ICD-10-CM | POA: Diagnosis not present

## 2017-11-19 DIAGNOSIS — J9811 Atelectasis: Secondary | ICD-10-CM | POA: Diagnosis not present

## 2017-11-19 DIAGNOSIS — N186 End stage renal disease: Secondary | ICD-10-CM | POA: Diagnosis not present

## 2017-11-19 DIAGNOSIS — R0902 Hypoxemia: Secondary | ICD-10-CM | POA: Diagnosis not present

## 2017-11-20 DIAGNOSIS — A020 Salmonella enteritis: Secondary | ICD-10-CM | POA: Diagnosis not present

## 2017-11-20 DIAGNOSIS — R531 Weakness: Secondary | ICD-10-CM | POA: Diagnosis not present

## 2017-11-20 DIAGNOSIS — N186 End stage renal disease: Secondary | ICD-10-CM | POA: Diagnosis not present

## 2017-11-20 DIAGNOSIS — R0602 Shortness of breath: Secondary | ICD-10-CM | POA: Diagnosis not present

## 2017-11-21 DIAGNOSIS — E119 Type 2 diabetes mellitus without complications: Secondary | ICD-10-CM | POA: Diagnosis not present

## 2017-11-21 DIAGNOSIS — E785 Hyperlipidemia, unspecified: Secondary | ICD-10-CM | POA: Diagnosis not present

## 2017-11-21 DIAGNOSIS — I10 Essential (primary) hypertension: Secondary | ICD-10-CM | POA: Diagnosis not present

## 2017-11-21 DIAGNOSIS — D509 Iron deficiency anemia, unspecified: Secondary | ICD-10-CM | POA: Diagnosis not present

## 2017-11-21 DIAGNOSIS — Z992 Dependence on renal dialysis: Secondary | ICD-10-CM | POA: Diagnosis not present

## 2017-11-21 DIAGNOSIS — R531 Weakness: Secondary | ICD-10-CM | POA: Diagnosis not present

## 2017-11-21 DIAGNOSIS — R0602 Shortness of breath: Secondary | ICD-10-CM | POA: Diagnosis not present

## 2017-11-21 DIAGNOSIS — A020 Salmonella enteritis: Secondary | ICD-10-CM | POA: Diagnosis not present

## 2017-11-21 DIAGNOSIS — R279 Unspecified lack of coordination: Secondary | ICD-10-CM | POA: Diagnosis not present

## 2017-11-21 DIAGNOSIS — Z7401 Bed confinement status: Secondary | ICD-10-CM | POA: Diagnosis not present

## 2017-11-21 DIAGNOSIS — N186 End stage renal disease: Secondary | ICD-10-CM | POA: Diagnosis not present

## 2017-11-22 DIAGNOSIS — N186 End stage renal disease: Secondary | ICD-10-CM | POA: Diagnosis not present

## 2017-11-22 DIAGNOSIS — E119 Type 2 diabetes mellitus without complications: Secondary | ICD-10-CM | POA: Diagnosis not present

## 2017-11-22 DIAGNOSIS — A020 Salmonella enteritis: Secondary | ICD-10-CM | POA: Diagnosis not present

## 2017-11-22 DIAGNOSIS — I10 Essential (primary) hypertension: Secondary | ICD-10-CM | POA: Diagnosis not present

## 2017-11-26 DIAGNOSIS — Z992 Dependence on renal dialysis: Secondary | ICD-10-CM | POA: Diagnosis not present

## 2017-11-26 DIAGNOSIS — E785 Hyperlipidemia, unspecified: Secondary | ICD-10-CM | POA: Diagnosis not present

## 2017-11-26 DIAGNOSIS — N186 End stage renal disease: Secondary | ICD-10-CM | POA: Diagnosis not present

## 2017-11-26 DIAGNOSIS — D509 Iron deficiency anemia, unspecified: Secondary | ICD-10-CM | POA: Diagnosis not present

## 2017-12-01 DIAGNOSIS — E1122 Type 2 diabetes mellitus with diabetic chronic kidney disease: Secondary | ICD-10-CM | POA: Diagnosis not present

## 2017-12-01 DIAGNOSIS — N186 End stage renal disease: Secondary | ICD-10-CM | POA: Diagnosis not present

## 2017-12-01 DIAGNOSIS — Z992 Dependence on renal dialysis: Secondary | ICD-10-CM | POA: Diagnosis not present

## 2017-12-01 DIAGNOSIS — I2510 Atherosclerotic heart disease of native coronary artery without angina pectoris: Secondary | ICD-10-CM | POA: Diagnosis not present

## 2017-12-04 DIAGNOSIS — Z6830 Body mass index (BMI) 30.0-30.9, adult: Secondary | ICD-10-CM | POA: Diagnosis not present

## 2017-12-04 DIAGNOSIS — R197 Diarrhea, unspecified: Secondary | ICD-10-CM | POA: Diagnosis not present

## 2017-12-04 DIAGNOSIS — A048 Other specified bacterial intestinal infections: Secondary | ICD-10-CM | POA: Diagnosis not present

## 2017-12-04 DIAGNOSIS — N186 End stage renal disease: Secondary | ICD-10-CM | POA: Diagnosis not present

## 2017-12-08 DIAGNOSIS — J449 Chronic obstructive pulmonary disease, unspecified: Secondary | ICD-10-CM | POA: Diagnosis not present

## 2017-12-11 ENCOUNTER — Ambulatory Visit: Payer: Medicare Other | Admitting: Neurology

## 2017-12-11 DIAGNOSIS — R29818 Other symptoms and signs involving the nervous system: Secondary | ICD-10-CM

## 2017-12-11 DIAGNOSIS — R251 Tremor, unspecified: Secondary | ICD-10-CM | POA: Diagnosis not present

## 2017-12-12 NOTE — Procedures (Signed)
   HISTORY: 77 year old male, with history of intermittent tremor, TECHNIQUE:  16 channel EEG was performed based on standard 10-16 international system. One channel was dedicated to EKG, which has demonstrates normal sinus rhythm.  Upon awakening, the posterior background activity was mildly dysrhythmic, 6 to 7 Hz, reactive to eye opening and closure.  There was no evidence of epileptiform discharge.  There was frequent muscle artifact.  Photic stimulation was performed, which induced a symmetric photic driving.  Hyperventilation was performed, there was no abnormality elicit.  No sleep was achieved.  CONCLUSION: This is a mild abnormal EEG.  There is no electrodiagnostic evidence of epileptiform discharge.  There is mild background slowing,, etiology are metabolic toxic.  Marcial Pacas, M.D. Ph.D.  [facility] [place], [place] [address] Phone: [phone] Fax:      [phone]

## 2017-12-12 NOTE — Progress Notes (Signed)
Please advise patient that his recent brain wave test called EEG was mildly abnormal showing mild slowing of his brain waves which is a nonspecific finding and not an explanation of his intermittent tremors and certainly not indicative of underlying seizures. Slowness of brain waves can be seen in the context of advancing [name], taking multiple medications, metabolic issues such as kidney disease or liver disease. At this point, there is no specific treatment indicated and he can follow-up with his primary care physician as discussed. Thomas Age, MD, PhD [facility])

## 2017-12-13 ENCOUNTER — Telehealth: Payer: Self-pay | Admitting: *Deleted

## 2017-12-13 NOTE — Telephone Encounter (Signed)
Called and LVM for pt (ok per DPR) about EEG results per Dr. Rexene Alberts note. Gave [facility] phone number if he has further questions/concerns.

## 2017-12-13 NOTE — Telephone Encounter (Signed)
-----   Message from Star Age, MD sent at 12/12/2017  5:39 PM EDT ----- Please advise patient that his recent brain wave test called EEG was mildly abnormal showing mild slowing of his brain waves which is a nonspecific finding and not an explanation of his intermittent tremors and certainly not indicative of underlying seizures. Slowness of brain waves can be seen in the context of advancing age, taking multiple medications, metabolic issues such as kidney disease or liver disease. At this point, there is no specific treatment indicated and he can follow-up with his primary care physician as discussed. Star Age, MD, PhD [facility])

## 2017-12-24 DIAGNOSIS — Z992 Dependence on renal dialysis: Secondary | ICD-10-CM | POA: Diagnosis not present

## 2017-12-24 DIAGNOSIS — N186 End stage renal disease: Secondary | ICD-10-CM | POA: Diagnosis not present

## 2017-12-24 DIAGNOSIS — D509 Iron deficiency anemia, unspecified: Secondary | ICD-10-CM | POA: Diagnosis not present

## 2017-12-25 DIAGNOSIS — C300 Malignant neoplasm of nasal cavity: Secondary | ICD-10-CM | POA: Diagnosis not present

## 2017-12-25 DIAGNOSIS — C319 Malignant neoplasm of accessory sinus, unspecified: Secondary | ICD-10-CM | POA: Diagnosis not present

## 2017-12-31 DIAGNOSIS — Z992 Dependence on renal dialysis: Secondary | ICD-10-CM | POA: Diagnosis not present

## 2017-12-31 DIAGNOSIS — N186 End stage renal disease: Secondary | ICD-10-CM | POA: Diagnosis not present

## 2018-01-01 DIAGNOSIS — N186 End stage renal disease: Secondary | ICD-10-CM | POA: Diagnosis not present

## 2018-01-01 DIAGNOSIS — Z992 Dependence on renal dialysis: Secondary | ICD-10-CM | POA: Diagnosis not present

## 2018-01-08 DIAGNOSIS — J449 Chronic obstructive pulmonary disease, unspecified: Secondary | ICD-10-CM | POA: Diagnosis not present

## 2018-01-13 NOTE — Telephone Encounter (Signed)
Please call Jonelle Sidle at [phone] to discuss findings. She said it is pointless to call the pt's number as he does not remember things or if they don't recognize the number they will not answer and will sometimes delete the number

## 2018-01-14 NOTE — Telephone Encounter (Signed)
Thomas [name], per DPR, returned my call. I explained pt's EEG results. Jonelle Sidle verbalized understanding of results and had no further questions.

## 2018-01-14 NOTE — Telephone Encounter (Signed)
I called Thomas [name], per DPR. No answer, left a message asking her to call me back.

## 2018-01-20 DIAGNOSIS — Z992 Dependence on renal dialysis: Secondary | ICD-10-CM | POA: Diagnosis not present

## 2018-01-20 DIAGNOSIS — N186 End stage renal disease: Secondary | ICD-10-CM | POA: Diagnosis not present

## 2018-01-28 DIAGNOSIS — Z992 Dependence on renal dialysis: Secondary | ICD-10-CM | POA: Diagnosis not present

## 2018-01-28 DIAGNOSIS — N186 End stage renal disease: Secondary | ICD-10-CM | POA: Diagnosis not present

## 2018-02-08 DIAGNOSIS — R0902 Hypoxemia: Secondary | ICD-10-CM | POA: Diagnosis not present

## 2018-02-08 DIAGNOSIS — I10 Essential (primary) hypertension: Secondary | ICD-10-CM | POA: Diagnosis not present

## 2018-02-08 DIAGNOSIS — R0689 Other abnormalities of breathing: Secondary | ICD-10-CM | POA: Diagnosis not present

## 2018-02-08 DIAGNOSIS — R531 Weakness: Secondary | ICD-10-CM | POA: Diagnosis not present

## 2018-02-09 DIAGNOSIS — E1129 Type 2 diabetes mellitus with other diabetic kidney complication: Secondary | ICD-10-CM | POA: Diagnosis not present

## 2018-02-09 DIAGNOSIS — N186 End stage renal disease: Secondary | ICD-10-CM | POA: Diagnosis not present

## 2018-02-09 DIAGNOSIS — J181 Lobar pneumonia, unspecified organism: Secondary | ICD-10-CM | POA: Diagnosis not present

## 2018-02-09 DIAGNOSIS — R509 Fever, unspecified: Secondary | ICD-10-CM | POA: Diagnosis not present

## 2018-02-09 DIAGNOSIS — J439 Emphysema, unspecified: Secondary | ICD-10-CM | POA: Diagnosis not present

## 2018-02-09 DIAGNOSIS — Z992 Dependence on renal dialysis: Secondary | ICD-10-CM | POA: Diagnosis not present

## 2018-02-09 DIAGNOSIS — I10 Essential (primary) hypertension: Secondary | ICD-10-CM | POA: Diagnosis not present

## 2018-02-09 DIAGNOSIS — Z87891 Personal history of nicotine dependence: Secondary | ICD-10-CM | POA: Diagnosis not present

## 2018-02-09 DIAGNOSIS — R0989 Other specified symptoms and signs involving the circulatory and respiratory systems: Secondary | ICD-10-CM | POA: Diagnosis not present

## 2018-02-09 DIAGNOSIS — R531 Weakness: Secondary | ICD-10-CM | POA: Diagnosis not present

## 2018-02-09 DIAGNOSIS — J189 Pneumonia, unspecified organism: Secondary | ICD-10-CM | POA: Diagnosis not present

## 2018-02-11 DIAGNOSIS — N2581 Secondary hyperparathyroidism of renal origin: Secondary | ICD-10-CM | POA: Diagnosis not present

## 2018-02-16 DIAGNOSIS — E1122 Type 2 diabetes mellitus with diabetic chronic kidney disease: Secondary | ICD-10-CM | POA: Diagnosis not present

## 2018-02-16 DIAGNOSIS — R4182 Altered mental status, unspecified: Secondary | ICD-10-CM | POA: Diagnosis not present

## 2018-02-16 DIAGNOSIS — S065X0A Traumatic subdural hemorrhage without loss of consciousness, initial encounter: Secondary | ICD-10-CM | POA: Diagnosis not present

## 2018-02-16 DIAGNOSIS — I120 Hypertensive chronic kidney disease with stage 5 chronic kidney disease or end stage renal disease: Secondary | ICD-10-CM | POA: Diagnosis not present

## 2018-02-16 DIAGNOSIS — I6200 Nontraumatic subdural hemorrhage, unspecified: Secondary | ICD-10-CM | POA: Diagnosis not present

## 2018-02-16 DIAGNOSIS — G9340 Encephalopathy, unspecified: Secondary | ICD-10-CM | POA: Diagnosis not present

## 2018-02-16 DIAGNOSIS — I259 Chronic ischemic heart disease, unspecified: Secondary | ICD-10-CM | POA: Diagnosis not present

## 2018-02-16 DIAGNOSIS — I1311 Hypertensive heart and chronic kidney disease without heart failure, with stage 5 chronic kidney disease, or end stage renal disease: Secondary | ICD-10-CM | POA: Diagnosis not present

## 2018-02-16 DIAGNOSIS — N186 End stage renal disease: Secondary | ICD-10-CM | POA: Diagnosis not present

## 2018-02-16 DIAGNOSIS — R509 Fever, unspecified: Secondary | ICD-10-CM | POA: Diagnosis not present

## 2018-02-16 DIAGNOSIS — G9349 Other encephalopathy: Secondary | ICD-10-CM | POA: Diagnosis not present

## 2018-02-16 DIAGNOSIS — Z992 Dependence on renal dialysis: Secondary | ICD-10-CM | POA: Diagnosis not present

## 2018-02-16 DIAGNOSIS — I2510 Atherosclerotic heart disease of native coronary artery without angina pectoris: Secondary | ICD-10-CM | POA: Diagnosis not present

## 2018-02-16 DIAGNOSIS — A419 Sepsis, unspecified organism: Secondary | ICD-10-CM | POA: Diagnosis not present

## 2018-02-16 DIAGNOSIS — R0602 Shortness of breath: Secondary | ICD-10-CM | POA: Diagnosis not present

## 2018-02-17 DIAGNOSIS — Z992 Dependence on renal dialysis: Secondary | ICD-10-CM | POA: Diagnosis not present

## 2018-02-17 DIAGNOSIS — G9349 Other encephalopathy: Secondary | ICD-10-CM | POA: Diagnosis not present

## 2018-02-17 DIAGNOSIS — A419 Sepsis, unspecified organism: Secondary | ICD-10-CM | POA: Diagnosis not present

## 2018-02-17 DIAGNOSIS — I2510 Atherosclerotic heart disease of native coronary artery without angina pectoris: Secondary | ICD-10-CM | POA: Diagnosis not present

## 2018-02-17 DIAGNOSIS — R509 Fever, unspecified: Secondary | ICD-10-CM | POA: Diagnosis not present

## 2018-02-17 DIAGNOSIS — G9340 Encephalopathy, unspecified: Secondary | ICD-10-CM | POA: Diagnosis not present

## 2018-02-17 DIAGNOSIS — N186 End stage renal disease: Secondary | ICD-10-CM | POA: Diagnosis not present

## 2018-02-17 DIAGNOSIS — I6200 Nontraumatic subdural hemorrhage, unspecified: Secondary | ICD-10-CM | POA: Diagnosis not present

## 2018-02-17 DIAGNOSIS — E1122 Type 2 diabetes mellitus with diabetic chronic kidney disease: Secondary | ICD-10-CM | POA: Diagnosis not present

## 2018-02-18 DIAGNOSIS — E1122 Type 2 diabetes mellitus with diabetic chronic kidney disease: Secondary | ICD-10-CM | POA: Diagnosis not present

## 2018-02-18 DIAGNOSIS — I120 Hypertensive chronic kidney disease with stage 5 chronic kidney disease or end stage renal disease: Secondary | ICD-10-CM | POA: Diagnosis not present

## 2018-02-18 DIAGNOSIS — N186 End stage renal disease: Secondary | ICD-10-CM | POA: Diagnosis not present

## 2018-02-18 DIAGNOSIS — Z992 Dependence on renal dialysis: Secondary | ICD-10-CM | POA: Diagnosis not present

## 2018-02-19 DIAGNOSIS — I120 Hypertensive chronic kidney disease with stage 5 chronic kidney disease or end stage renal disease: Secondary | ICD-10-CM | POA: Diagnosis not present

## 2018-02-19 DIAGNOSIS — Z992 Dependence on renal dialysis: Secondary | ICD-10-CM | POA: Diagnosis not present

## 2018-02-19 DIAGNOSIS — N186 End stage renal disease: Secondary | ICD-10-CM | POA: Diagnosis not present

## 2018-02-19 DIAGNOSIS — E1122 Type 2 diabetes mellitus with diabetic chronic kidney disease: Secondary | ICD-10-CM | POA: Diagnosis not present

## 2018-02-25 DIAGNOSIS — E785 Hyperlipidemia, unspecified: Secondary | ICD-10-CM | POA: Diagnosis not present

## 2018-02-25 DIAGNOSIS — Z794 Long term (current) use of insulin: Secondary | ICD-10-CM | POA: Diagnosis not present

## 2018-02-25 DIAGNOSIS — E119 Type 2 diabetes mellitus without complications: Secondary | ICD-10-CM | POA: Diagnosis not present

## 2018-02-25 DIAGNOSIS — D509 Iron deficiency anemia, unspecified: Secondary | ICD-10-CM | POA: Diagnosis not present

## 2018-03-03 DIAGNOSIS — N186 End stage renal disease: Secondary | ICD-10-CM | POA: Diagnosis not present

## 2018-03-03 DIAGNOSIS — Z992 Dependence on renal dialysis: Secondary | ICD-10-CM | POA: Diagnosis not present

## 2018-03-04 DIAGNOSIS — R0902 Hypoxemia: Secondary | ICD-10-CM | POA: Diagnosis not present

## 2018-03-04 DIAGNOSIS — G252 Other specified forms of tremor: Secondary | ICD-10-CM | POA: Diagnosis not present

## 2018-03-04 DIAGNOSIS — I5031 Acute diastolic (congestive) heart failure: Secondary | ICD-10-CM | POA: Diagnosis not present

## 2018-03-04 DIAGNOSIS — J449 Chronic obstructive pulmonary disease, unspecified: Secondary | ICD-10-CM | POA: Diagnosis not present

## 2018-03-04 DIAGNOSIS — J810 Acute pulmonary edema: Secondary | ICD-10-CM | POA: Diagnosis not present

## 2018-03-04 DIAGNOSIS — I509 Heart failure, unspecified: Secondary | ICD-10-CM | POA: Diagnosis not present

## 2018-03-04 DIAGNOSIS — E1122 Type 2 diabetes mellitus with diabetic chronic kidney disease: Secondary | ICD-10-CM | POA: Diagnosis not present

## 2018-03-04 DIAGNOSIS — I132 Hypertensive heart and chronic kidney disease with heart failure and with stage 5 chronic kidney disease, or end stage renal disease: Secondary | ICD-10-CM | POA: Diagnosis not present

## 2018-03-04 DIAGNOSIS — A419 Sepsis, unspecified organism: Secondary | ICD-10-CM | POA: Diagnosis not present

## 2018-03-04 DIAGNOSIS — N390 Urinary tract infection, site not specified: Secondary | ICD-10-CM | POA: Diagnosis not present

## 2018-03-04 DIAGNOSIS — Z720 Tobacco use: Secondary | ICD-10-CM | POA: Diagnosis not present

## 2018-03-04 DIAGNOSIS — N186 End stage renal disease: Secondary | ICD-10-CM | POA: Diagnosis not present

## 2018-03-04 DIAGNOSIS — R0602 Shortness of breath: Secondary | ICD-10-CM | POA: Diagnosis not present

## 2018-03-05 DIAGNOSIS — E785 Hyperlipidemia, unspecified: Secondary | ICD-10-CM | POA: Diagnosis not present

## 2018-03-05 DIAGNOSIS — I132 Hypertensive heart and chronic kidney disease with heart failure and with stage 5 chronic kidney disease, or end stage renal disease: Secondary | ICD-10-CM | POA: Diagnosis not present

## 2018-03-05 DIAGNOSIS — E1122 Type 2 diabetes mellitus with diabetic chronic kidney disease: Secondary | ICD-10-CM | POA: Diagnosis not present

## 2018-03-05 DIAGNOSIS — I5031 Acute diastolic (congestive) heart failure: Secondary | ICD-10-CM | POA: Diagnosis not present

## 2018-03-10 DIAGNOSIS — J449 Chronic obstructive pulmonary disease, unspecified: Secondary | ICD-10-CM | POA: Diagnosis not present

## 2018-03-12 DIAGNOSIS — R269 Unspecified abnormalities of gait and mobility: Secondary | ICD-10-CM | POA: Diagnosis not present

## 2018-03-12 DIAGNOSIS — Z6829 Body mass index (BMI) 29.0-29.9, adult: Secondary | ICD-10-CM | POA: Diagnosis not present

## 2018-03-12 DIAGNOSIS — I509 Heart failure, unspecified: Secondary | ICD-10-CM | POA: Diagnosis not present

## 2018-03-12 DIAGNOSIS — N186 End stage renal disease: Secondary | ICD-10-CM | POA: Diagnosis not present

## 2018-03-12 DIAGNOSIS — E663 Overweight: Secondary | ICD-10-CM | POA: Diagnosis not present

## 2018-03-18 DIAGNOSIS — N2581 Secondary hyperparathyroidism of renal origin: Secondary | ICD-10-CM | POA: Diagnosis not present

## 2018-03-25 DIAGNOSIS — J811 Chronic pulmonary edema: Secondary | ICD-10-CM | POA: Diagnosis not present

## 2018-03-25 DIAGNOSIS — R269 Unspecified abnormalities of gait and mobility: Secondary | ICD-10-CM | POA: Diagnosis not present

## 2018-04-01 DIAGNOSIS — N186 End stage renal disease: Secondary | ICD-10-CM | POA: Diagnosis not present

## 2018-04-01 DIAGNOSIS — D509 Iron deficiency anemia, unspecified: Secondary | ICD-10-CM | POA: Diagnosis not present

## 2018-04-01 DIAGNOSIS — Z992 Dependence on renal dialysis: Secondary | ICD-10-CM | POA: Diagnosis not present

## 2018-04-03 DIAGNOSIS — N186 End stage renal disease: Secondary | ICD-10-CM | POA: Diagnosis not present

## 2018-04-03 DIAGNOSIS — Z992 Dependence on renal dialysis: Secondary | ICD-10-CM | POA: Diagnosis not present

## 2018-04-15 DIAGNOSIS — N2581 Secondary hyperparathyroidism of renal origin: Secondary | ICD-10-CM | POA: Diagnosis not present

## 2018-04-29 DIAGNOSIS — Z992 Dependence on renal dialysis: Secondary | ICD-10-CM | POA: Diagnosis not present

## 2018-04-29 DIAGNOSIS — N186 End stage renal disease: Secondary | ICD-10-CM | POA: Diagnosis not present

## 2018-05-03 DIAGNOSIS — Z992 Dependence on renal dialysis: Secondary | ICD-10-CM | POA: Diagnosis not present

## 2018-05-03 DIAGNOSIS — N186 End stage renal disease: Secondary | ICD-10-CM | POA: Diagnosis not present

## 2018-05-13 DIAGNOSIS — N2581 Secondary hyperparathyroidism of renal origin: Secondary | ICD-10-CM | POA: Diagnosis not present

## 2018-05-23 DIAGNOSIS — J9801 Acute bronchospasm: Secondary | ICD-10-CM | POA: Diagnosis not present

## 2018-05-23 DIAGNOSIS — R251 Tremor, unspecified: Secondary | ICD-10-CM | POA: Diagnosis not present

## 2018-05-23 DIAGNOSIS — J441 Chronic obstructive pulmonary disease with (acute) exacerbation: Secondary | ICD-10-CM | POA: Diagnosis not present

## 2018-05-23 DIAGNOSIS — Z6830 Body mass index (BMI) 30.0-30.9, adult: Secondary | ICD-10-CM | POA: Diagnosis not present

## 2018-05-24 DIAGNOSIS — R1111 Vomiting without nausea: Secondary | ICD-10-CM | POA: Diagnosis not present

## 2018-05-24 DIAGNOSIS — R0689 Other abnormalities of breathing: Secondary | ICD-10-CM | POA: Diagnosis not present

## 2018-05-24 DIAGNOSIS — S065X0A Traumatic subdural hemorrhage without loss of consciousness, initial encounter: Secondary | ICD-10-CM | POA: Diagnosis not present

## 2018-05-24 DIAGNOSIS — R52 Pain, unspecified: Secondary | ICD-10-CM | POA: Diagnosis not present

## 2018-05-24 DIAGNOSIS — S0003XA Contusion of scalp, initial encounter: Secondary | ICD-10-CM | POA: Diagnosis not present

## 2018-05-24 DIAGNOSIS — N186 End stage renal disease: Secondary | ICD-10-CM | POA: Diagnosis not present

## 2018-05-24 DIAGNOSIS — W19XXXA Unspecified fall, initial encounter: Secondary | ICD-10-CM | POA: Diagnosis not present

## 2018-05-24 DIAGNOSIS — R0902 Hypoxemia: Secondary | ICD-10-CM | POA: Diagnosis not present

## 2018-05-29 DIAGNOSIS — Z992 Dependence on renal dialysis: Secondary | ICD-10-CM | POA: Diagnosis not present

## 2018-05-29 DIAGNOSIS — D509 Iron deficiency anemia, unspecified: Secondary | ICD-10-CM | POA: Diagnosis not present

## 2018-05-29 DIAGNOSIS — E785 Hyperlipidemia, unspecified: Secondary | ICD-10-CM | POA: Diagnosis not present

## 2018-05-29 DIAGNOSIS — N186 End stage renal disease: Secondary | ICD-10-CM | POA: Diagnosis not present

## 2018-06-03 DIAGNOSIS — Z992 Dependence on renal dialysis: Secondary | ICD-10-CM | POA: Diagnosis not present

## 2018-06-03 DIAGNOSIS — N186 End stage renal disease: Secondary | ICD-10-CM | POA: Diagnosis not present

## 2018-06-16 ENCOUNTER — Other Ambulatory Visit: Payer: Self-pay

## 2018-06-16 DIAGNOSIS — N186 End stage renal disease: Secondary | ICD-10-CM

## 2018-06-17 DIAGNOSIS — N2581 Secondary hyperparathyroidism of renal origin: Secondary | ICD-10-CM | POA: Diagnosis not present

## 2018-06-20 ENCOUNTER — Ambulatory Visit (INDEPENDENT_AMBULATORY_CARE_PROVIDER_SITE_OTHER): Payer: Medicare Other | Admitting: Physician Assistant

## 2018-06-20 ENCOUNTER — Ambulatory Visit (HOSPITAL_COMMUNITY)
Admission: RE | Admit: 2018-06-20 | Discharge: 2018-06-20 | Disposition: A | Discharge status: Home / Self Care | Payer: Medicare Other | Source: Ambulatory Visit | Attending: Vascular Surgery | Admitting: Vascular Surgery

## 2018-06-20 ENCOUNTER — Other Ambulatory Visit: Payer: Self-pay

## 2018-06-20 VITALS — BP 140/57 | HR 69 | Temp 97.2°F | Resp 20 | Ht 70.0 in | Wt 222.0 lb

## 2018-06-20 DIAGNOSIS — N186 End stage renal disease: Secondary | ICD-10-CM | POA: Diagnosis not present

## 2018-06-20 NOTE — Progress Notes (Signed)
Established Dialysis Access   History of Present Illness   Thomas [name] is a 78 y.o. (1940-12-31) male who presents for re-evaluation of permanent access.  He has a left radiocephalic fistula that was created by Dr. Bridgett [name] in January 2015.  He is dialyzing on a Tuesday Thursday Saturday schedule in [place] under the management of Dr. Lowanda Foster.  He returns to clinic because the dialysis techs have been pulling clots from fistula cannulation site.  The patient states they started using heparin flushes in the past 1 to 2 weeks and he has not noticed any more clotting of the needles during dialysis.  He has not missed any dialysis sessions and has been able to complete a full treatment every time.  The patient has extensive medical and surgical history and would prefer to not have any surgery if at all possible.   Current Outpatient Medications  Medication Sig Dispense Refill  . albuterol (PROVENTIL,VENTOLIN) 2 MG/5ML syrup Take 2 mg by mouth 3 (three) times daily.    Marland Kitchen amLODipine (NORVASC) 10 MG tablet Take 10 mg by mouth daily.    Marland Kitchen aspirin EC 81 MG tablet Take 81 mg by mouth daily.    Marland Kitchen b complex-vitamin c-folic acid (NEPHRO-VITE) 0.8 MG TABS tablet Take 1 tablet by mouth daily.    . butalbital-acetaminophen-caffeine (FIORICET, ESGIC) 50-325-40 MG per tablet Take 1 tablet by mouth every 6 (six) hours as needed for headache.     . calcium acetate (PHOSLO) 667 MG capsule Take 667-1,334 mg by mouth See admin instructions. TAKE 2 CAPS BY MOUTH 3 TIMES DAILY WITH MEALS. TAKE 1 CAP 2 TIMES DAILY WITH SNACKS    . Calcium Carb-Cholecalciferol (CALCIUM + D3 PO) Take by mouth daily.    . Cholecalciferol (VITAMIN D3 PO) Take 2 capsules by mouth 2 (two) times daily.    Marland Kitchen dicyclomine (BENTYL) 20 MG tablet Take 20 mg by mouth 4 (four) times daily as needed for spasms.     Marland Kitchen gabapentin (NEURONTIN) 300 MG capsule Take 300 mg by mouth 3 (three) times daily.    . insulin lispro (HUMALOG) 100 UNIT/ML injection  Inject into the skin.    Marland Kitchen isosorbide mononitrate (IMDUR) 30 MG 24 hr tablet Take 30 mg by mouth daily.     Thomas [name] Oil 300 MG CAPS Take 1 capsule by mouth daily.     Marland Kitchen LANTUS SOLOSTAR 100 UNIT/ML injection Inject 60 Units into the skin at bedtime. According to sugar level.    Marland Kitchen losartan (COZAAR) 100 MG tablet Take by mouth.    . losartan-hydrochlorothiazide (HYZAAR) 100-12.5 MG per tablet Take 1 tablet by mouth daily.     . magnesium oxide (MAG-OX) 400 MG tablet Take 400 mg by mouth daily.    . metoprolol (LOPRESSOR) 100 MG tablet Take 100 mg by mouth daily.     Marland Kitchen NITROSTAT 0.4 MG SL tablet Place 0.4 mg under the tongue every 5 (five) minutes as needed for chest pain.     Marland Kitchen ondansetron (ZOFRAN) 4 MG tablet Take 4 mg by mouth every 8 (eight) hours as needed for nausea or vomiting.    . primidone (MYSOLINE) 50 MG tablet Take 50 mg by mouth at bedtime.    . diphenoxylate-atropine (LOMOTIL) 2.5-0.025 MG tablet Take 2 tablets by mouth 4 (four) times daily as needed for diarrhea or loose stools.     No current facility-administered medications for this visit.     On ROS today: 10 system ROS  is negative unless otherwise noted in HPI   Physical Examination   Vitals:   06/20/18 1304  BP: (!) 140/57  Pulse: 69  Resp: 20  Temp: (!) 97.2 F (36.2 C)  SpO2: 93%  Weight: 222 lb (100.7 kg)  Height: 5\' 10"  (1.778 m)   Body mass index is 31.85 kg/m.  General Alert, O x 3, WD, NAD  Pulmonary Sym exp, good B air movt,   Cardiac RRR, Nl S1, S2,   Vascular Vessel Right Left  Radial Palpable Palpable  Brachial Palpable Palpable  Ulnar Not palpable Faintly palpable    Musculo- skeletal L arm fistula with palpable thrill; thrill weakens beyond area of repeated cannulation    Neurologic A&O; CN grossly intact     Non-invasive Vascular Imaging   left Arm Access Duplex 06/20/18  Patent fistula with aneurysmal dilation    Medical Decision Making   Thomas [name] is a 78 y.o. male who  presents with ESRD requiring hemodialysis.    Patent fistula with palpable thrill however thrill weakens beyond the area of repeated cannulation  Patient states dialysis techs are no longer pulling clots since they started using more heparin flush in the past 1 to 2 weeks  Patient was offered fistulogram with possible intervention; he however strongly prefers to wait several more weeks to see if there will continue to be any clotting of the needles during dialysis before he consents to a fistulogram  He understands the risks which include thrombosis of fistula and need for new access  He may call the office in the next few weeks to schedule his fistulogram without returning to clinic   Thomas Ligas PA-C [facility] Office: [phone]

## 2018-07-01 DIAGNOSIS — N186 End stage renal disease: Secondary | ICD-10-CM | POA: Diagnosis not present

## 2018-07-01 DIAGNOSIS — Z992 Dependence on renal dialysis: Secondary | ICD-10-CM | POA: Diagnosis not present

## 2018-07-15 DIAGNOSIS — N2581 Secondary hyperparathyroidism of renal origin: Secondary | ICD-10-CM | POA: Diagnosis not present

## 2018-08-14 ENCOUNTER — Other Ambulatory Visit: Payer: Self-pay

## 2018-08-14 ENCOUNTER — Encounter (INDEPENDENT_AMBULATORY_CARE_PROVIDER_SITE_OTHER): Payer: Self-pay | Admitting: Orthopaedic Surgery

## 2018-08-14 ENCOUNTER — Ambulatory Visit (INDEPENDENT_AMBULATORY_CARE_PROVIDER_SITE_OTHER): Payer: Medicare Other | Admitting: Orthopaedic Surgery

## 2018-08-14 VITALS — BP 109/52 | HR 75 | Ht 70.0 in | Wt 222.0 lb

## 2018-08-14 DIAGNOSIS — S8264XA Nondisplaced fracture of lateral malleolus of right fibula, initial encounter for closed fracture: Secondary | ICD-10-CM

## 2018-08-14 NOTE — Progress Notes (Signed)
Office Visit Note   Patient: Thomas [name]           Date of Birth: 1940/12/12           MRN: [id number] Visit Date: 08/14/2018              Requested by: Redmond School, [place], [place] [address] PCP: Redmond School, MD   Assessment & Plan: Visit Diagnoses:  1. Closed nondisplaced fracture of lateral malleolus of right fibula, initial encounter     Plan: Cam boot applied.  He will limit his ambulation is much as he can keep his foot elevated.  He washes with soap and water apply a dry white sock.  Return 4 weeks repeat x-rays on return.  Develops increased drainage or cellulitis of his foot he will contact us promptly.  Currently no indications for antibiotics.  Follow-Up Instructions: Return in about 4 weeks (around 09/11/2018).   Orders:  No orders of the defined types were placed in this encounter.  No orders of the defined types were placed in this encounter.     Procedures: No procedures performed   Clinical Data: No additional findings.   Subjective: Chief Complaint  Patient presents with  . Right Ankle - Fracture    DOI 08/09/2018    HPI 78 year old male diabetes renal dialysis patient fell in the bedroom suffering a nondisplaced left distal fibular fracture.  There is no widening of the medial clear space he is not in any compliance.  He been wearing his bedroom slippers and has ulcer over the dorsum of his second toe.  No fever chills no current cellulitis.  Review of Systems view of system positive for cigarette smoker type 2 diabetes coronary artery disease abdominal pain renal dialysis, partial rhinectomy 2017.  Cataracts with limb implants otherwise negative as pertains HPI.   Objective: Vital Signs: BP (!) 109/52   Pulse 75   Ht 5\' 10"  (1.778 m)   Wt 222 lb (100.7 kg)   BMI 31.85 kg/m   Physical Exam Constitutional:      Appearance: He is well-developed.  HENT:     Head: Normocephalic and atraumatic.  Eyes:     Pupils:  Pupils are equal, round, and reactive to light.  Neck:     Thyroid: No thyromegaly.     Trachea: No tracheal deviation.  Cardiovascular:     Rate and Rhythm: Normal rate.  Pulmonary:     Effort: Pulmonary effort is normal.     Breath sounds: No wheezing.  Abdominal:     General: Bowel sounds are normal.     Palpations: Abdomen is soft.  Skin:    General: Skin is warm and dry.     Capillary Refill: Capillary refill takes less than 2 seconds.  Neurological:     Mental Status: He is alert and oriented to person, place, and time.  Psychiatric:        Behavior: Behavior normal.        Thought Content: Thought content normal.        Judgment: Judgment normal.     Ortho Exam patient has 9 mm oval ulcer of the dorsum of his second toe over the proximal phalanx without exposed tendon.  No cellulitis.  Ecchymosis at the ankle medial and lateral.  Neuropathy with out significant pain with pressure over the lateral malleolus.  Specialty Comments:  No specialty comments available.  Imaging: No results found.   PMFS History: Patient Active Problem List  Diagnosis Date Noted  . Congestive heart failure ([place]) 05/11/2016  . Venous stenosis of left upper extremity 05/11/2016  . End stage renal disease ([place]) 06/19/2013  . Rectal bleeding 11/27/2012  . Upper abdominal pain 07/07/2012  . Anemia 07/07/2012  . Chronic nausea 07/07/2012  . Microscopic colitis 03/07/2012  . DIARRHEA 02/01/2010  . DIVERTICULITIS, HX OF 02/01/2010  . CONSTIPATION 09/10/2008  . ABDOMINAL PAIN, LOWER 09/10/2008  . DIABETES MELLITUS, TYPE II 09/09/2008  . CIGARETTE SMOKER 09/09/2008  . CORONARY ARTERY DISEASE 09/09/2008  . 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 Esophageal reflux 09/09/2008  . HIATAL HERNIA 09/09/2008  . HOARSENESS 09/09/2008   Past Medical History:  Diagnosis Date  . Anemia   . Arthritis    hands, back  . Cardiac arrest ([place])   . Cataract   . CHF (congestive heart failure) ([place])   . Chronic  kidney disease    CKD, hemodialysis Tues/Thurs/Sat  . COPD (chronic obstructive pulmonary disease) ([place])   . Diabetes mellitus ([place])   . Diverticulosis 2013   tcs by RMR  . Diverticulosis   . Erosive esophagitis   . GERD (gastroesophageal reflux disease)   . Hiatal hernia    pt not aware of this  . HTN (hypertension)   . Hypercholesterolemia   . Hypothyroidism   . Irritable bowel syndrome (IBS)   . Lymphocytic colitis 2013   tcs by RMR  . Myocardial infarction ([place])    X2  . Nasal mass   . Neuropathy   . Pneumonia   . Schatzki's ring   . Shortness of breath   . Sleep apnea    uses O2 only  . Tobacco abuse     Family History  Problem Relation Age of Onset  . Cancer Mother   . Cancer Father   . Diabetes Brother   . Cancer Brother   . Diabetes Sister   . Cancer Sister   . Cancer Sister   . Colon cancer Neg Hx     Past Surgical History:  Procedure Laterality Date  . AV FISTULA PLACEMENT Left 06/29/2013   Procedure: LEFT RADIAL/CEPHALIC FISTULA;  Surgeon: Conrad Meadowood, MD;  Location: [facility];  Service: Vascular;  Laterality: Left;  . cardiac stents     5  . CATARACT EXTRACTION W/PHACO Left 05/17/2014   Procedure: CATARACT EXTRACTION PHACO AND INTRAOCULAR LENS PLACEMENT (IOC);  Surgeon: Tonny Branch, MD;  Location: [facility];  Service: Ophthalmology;  Laterality: Left;  CDE:7.71  . CATARACT EXTRACTION W/PHACO Right 06/14/2014   Procedure: CATARACT EXTRACTION PHACO AND INTRAOCULAR LENS PLACEMENT RIGHT EYE;  Surgeon: Tonny Branch, MD;  Location: [facility];  Service: Ophthalmology;  Laterality: Right;  CDE:8.22  . CHOLECYSTECTOMY    . COLONOSCOPY  December 2007   adenomatous polyps, sigmoid diverticula  . COLONOSCOPY  02/06/2012   Dr. Gala Romney- colonic diverticulosis, bx consistent with lymphocytic colitis  . CORONARY ANGIOPLASTY     x5 stents  . ESOPHAGOGASTRODUODENOSCOPY  May 2009   RMR: non-critical Schatzki's ring, sp dilation with 62 F Maloney, small inlet patch, mild erosive relufx  esophagitis  . ESOPHAGOGASTRODUODENOSCOPY N/A 07/15/2012   Dr. Gala Romney- hiatal hernia, duodenal ulcer with surrounding erosions. this may well be related to the "haziness" sen around the head of the pancreas and duodenum on recent noncontrast ct scan and may have much to do with his abdominal pain.  Marland Kitchen EXCISION NASAL MASS N/A 07/18/2015   Procedure: EXCISION OF NASAL MASS;  Surgeon: Leta Baptist, MD;  Location: [facility];  Service: ENT;  Laterality: N/A;  . INSERTION OF DIALYSIS CATHETER Right 05/21/2013   Procedure: INSERTION OF DIALYSIS CATHETER;  Surgeon: Conrad [place], MD;  Location: [facility];  Service: Vascular;  Laterality: Right;  Marland Kitchen VASECTOMY     Social History   Occupational History  . 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 Smoking status: Former Smoker    Packs/day: 0.00    Years: 60.00    Pack years: 0.00    Types: E-cigarettes    Last attempt to quit: 11/10/2014    Years since quitting: 3.7  . Smokeless tobacco: Never Used  Substance and Sexual Activity  . Alcohol use: No  . Drug use: No  . Sexual activity: Not on file

## 2018-09-10 ENCOUNTER — Telehealth (INDEPENDENT_AMBULATORY_CARE_PROVIDER_SITE_OTHER): Payer: Self-pay | Admitting: Radiology

## 2018-09-10 NOTE — Telephone Encounter (Signed)
I called and confirmed patient appointment for 09/10/2018 with patient's daughter.  Answered "No" to all COVID-19 questions.

## 2018-09-11 ENCOUNTER — Ambulatory Visit (INDEPENDENT_AMBULATORY_CARE_PROVIDER_SITE_OTHER): Payer: Medicare Other

## 2018-09-11 ENCOUNTER — Ambulatory Visit (INDEPENDENT_AMBULATORY_CARE_PROVIDER_SITE_OTHER): Payer: Medicare Other | Admitting: Orthopaedic Surgery

## 2018-09-11 ENCOUNTER — Other Ambulatory Visit: Payer: Self-pay

## 2018-09-11 ENCOUNTER — Encounter (INDEPENDENT_AMBULATORY_CARE_PROVIDER_SITE_OTHER): Payer: Self-pay | Admitting: Orthopaedic Surgery

## 2018-09-11 VITALS — Ht 70.0 in | Wt 222.0 lb

## 2018-09-11 DIAGNOSIS — S8264XA Nondisplaced fracture of lateral malleolus of right fibula, initial encounter for closed fracture: Secondary | ICD-10-CM | POA: Diagnosis not present

## 2018-09-11 NOTE — Progress Notes (Signed)
Post-Op Visit Note   Patient: Thomas [name]           Date of Birth: [birth year]           MRN: [id number] Visit Date: 09/11/2018 PCP: Redmond School, MD   Assessment & Plan: Follow-up right lateral malleolar fracture date of injury 08/09/2018.  X-ray showed good position and alignment.  He will continue the boot for 3 more weeks and then go back into his regular shoe.  He is not having any pain in his ankle due to his neuropathy.  We discussed the importance of diabetic control.  He can follow-up if it he has persistent problems.  Chief Complaint:  Chief Complaint  Patient presents with  . Right Ankle - Fracture, Follow-up    DOi 08/09/2018   Visit Diagnoses:  1. Closed nondisplaced fracture of lateral malleolus of right fibula, initial encounter     Plan: Patient continue the cam boot for 3 more weeks.  He can then wean himself to the shoe continues with his rolling walker with reverse seat.  Follow-up here as needed.  If he gets increased swelling or ecchymosis or deformity he can return.  Follow-Up Instructions: No follow-ups on file.   Orders:  Orders Placed This Encounter  Procedures  . XR Ankle Complete Right   No orders of the defined types were placed in this encounter.   Imaging: No results found.  PMFS History: Patient Active Problem List   Diagnosis Date Noted  . Congestive heart failure ([place]) 05/11/2016  . Venous stenosis of left upper extremity 05/11/2016  . End stage renal disease ([place]) 06/19/2013  . Rectal bleeding 11/27/2012  . Upper abdominal pain 07/07/2012  . Anemia 07/07/2012  . Chronic nausea 07/07/2012  . Microscopic colitis 03/07/2012  . DIARRHEA 02/01/2010  . DIVERTICULITIS, HX OF 02/01/2010  . CONSTIPATION 09/10/2008  . ABDOMINAL PAIN, LOWER 09/10/2008  . DIABETES MELLITUS, TYPE II 09/09/2008  . CIGARETTE SMOKER 09/09/2008  . CORONARY ARTERY DISEASE 09/09/2008  . 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 Esophageal reflux 09/09/2008  . HIATAL  HERNIA 09/09/2008  . HOARSENESS 09/09/2008   Past Medical History:  Diagnosis Date  . Anemia   . Arthritis    hands, back  . Cardiac arrest ([place])   . Cataract   . CHF (congestive heart failure) ([place])   . Chronic kidney disease    CKD, hemodialysis Tues/Thurs/Sat  . COPD (chronic obstructive pulmonary disease) ([place])   . Diabetes mellitus ([place])   . Diverticulosis 2013   tcs by RMR  . Diverticulosis   . Erosive esophagitis   . GERD (gastroesophageal reflux disease)   . Hiatal hernia    pt not aware of this  . HTN (hypertension)   . Hypercholesterolemia   . Hypothyroidism   . Irritable bowel syndrome (IBS)   . Lymphocytic colitis 2013   tcs by RMR  . Myocardial infarction ([place])    X2  . Nasal mass   . Neuropathy   . Pneumonia   . Schatzki's ring   . Shortness of breath   . Sleep apnea    uses O2 only  . Tobacco abuse     Family History  Problem Relation Age of Onset  . Cancer Mother   . Cancer Father   . Diabetes Brother   . Cancer Brother   . Diabetes Sister   . Cancer Sister   . Cancer Sister   . Colon cancer Neg Hx     Past Surgical  History:  Procedure Laterality Date  . AV FISTULA PLACEMENT Left 06/29/2013   Procedure: LEFT RADIAL/CEPHALIC FISTULA;  Surgeon: Conrad Wellston, MD;  Location: [facility];  Service: Vascular;  Laterality: Left;  . cardiac stents     5  . CATARACT EXTRACTION W/PHACO Left 05/17/2014   Procedure: CATARACT EXTRACTION PHACO AND INTRAOCULAR LENS PLACEMENT (IOC);  Surgeon: Tonny Branch, MD;  Location: [facility];  Service: Ophthalmology;  Laterality: Left;  CDE:7.71  . CATARACT EXTRACTION W/PHACO Right 06/14/2014   Procedure: CATARACT EXTRACTION PHACO AND INTRAOCULAR LENS PLACEMENT RIGHT EYE;  Surgeon: Tonny Branch, MD;  Location: [facility];  Service: Ophthalmology;  Laterality: Right;  CDE:8.22  . CHOLECYSTECTOMY    . COLONOSCOPY  December 2007   adenomatous polyps, sigmoid diverticula  . COLONOSCOPY  02/06/2012   Dr. Gala Romney- colonic diverticulosis, bx  consistent with lymphocytic colitis  . CORONARY ANGIOPLASTY     x5 stents  . ESOPHAGOGASTRODUODENOSCOPY  May 2009   RMR: non-critical Schatzki's ring, sp dilation with 44 F Maloney, small inlet patch, mild erosive relufx esophagitis  . ESOPHAGOGASTRODUODENOSCOPY N/A 07/15/2012   Dr. Gala Romney- hiatal hernia, duodenal ulcer with surrounding erosions. this may well be related to the "haziness" sen around the head of the pancreas and duodenum on recent noncontrast ct scan and may have much to do with his abdominal pain.  Marland Kitchen EXCISION NASAL MASS N/A 07/18/2015   Procedure: EXCISION OF NASAL MASS;  Surgeon: Leta Baptist, MD;  Location: [facility];  Service: ENT;  Laterality: N/A;  . INSERTION OF DIALYSIS CATHETER Right 05/21/2013   Procedure: INSERTION OF DIALYSIS CATHETER;  Surgeon: Conrad [place], MD;  Location: [facility];  Service: Vascular;  Laterality: Right;  Marland Kitchen VASECTOMY     Social History   Occupational History  . 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 Smoking status: Former Smoker    Packs/day: 0.00    Years: 60.00    Pack years: 0.00    Types: E-cigarettes    Last attempt to quit: 11/10/2014    Years since quitting: 3.8  . Smokeless tobacco: Never Used  Substance and Sexual Activity  . Alcohol use: No  . Drug use: No  . Sexual activity: Not on file

## 2018-10-23 ENCOUNTER — Telehealth: Payer: Self-pay | Admitting: *Deleted

## 2018-10-23 NOTE — Telephone Encounter (Signed)
Per [facility], they are having issues with difficult cannulation and poor flow with this patient. He saw [place], [place] in January and was to call us to set up a fistulogram at that time but he has not called. I asked Dr. Oneida Alar and he said to set patient up for left arm fistulogram possible interventions asap. He has HD on M,W,F so will schedule him for next Tuesday with Dr. Scot Dock.

## 2018-10-24 ENCOUNTER — Other Ambulatory Visit: Payer: Self-pay

## 2018-10-24 DIAGNOSIS — N186 End stage renal disease: Secondary | ICD-10-CM

## 2018-10-28 ENCOUNTER — Other Ambulatory Visit (HOSPITAL_COMMUNITY): Payer: Self-pay | Admitting: Nephrology

## 2018-10-28 ENCOUNTER — Other Ambulatory Visit: Payer: Self-pay | Admitting: Student

## 2018-10-28 ENCOUNTER — Other Ambulatory Visit: Payer: Self-pay | Admitting: Physician Assistant

## 2018-10-28 DIAGNOSIS — N186 End stage renal disease: Secondary | ICD-10-CM

## 2018-10-29 ENCOUNTER — Other Ambulatory Visit (HOSPITAL_COMMUNITY): Payer: Self-pay | Admitting: Nephrology

## 2018-10-29 ENCOUNTER — Encounter (HOSPITAL_COMMUNITY): Payer: Self-pay

## 2018-10-29 ENCOUNTER — Ambulatory Visit (HOSPITAL_COMMUNITY)
Admission: RE | Admit: 2018-10-29 | Discharge: 2018-10-29 | Disposition: A | Discharge status: Home / Self Care | Payer: Medicare Other | Source: Ambulatory Visit | Attending: Nephrology | Admitting: Nephrology

## 2018-10-29 ENCOUNTER — Other Ambulatory Visit: Payer: Self-pay

## 2018-10-29 ENCOUNTER — Telehealth: Payer: Self-pay | Admitting: *Deleted

## 2018-10-29 DIAGNOSIS — K219 Gastro-esophageal reflux disease without esophagitis: Secondary | ICD-10-CM | POA: Diagnosis not present

## 2018-10-29 DIAGNOSIS — Z888 Allergy status to other drugs, medicaments and biological substances status: Secondary | ICD-10-CM | POA: Insufficient documentation

## 2018-10-29 DIAGNOSIS — J449 Chronic obstructive pulmonary disease, unspecified: Secondary | ICD-10-CM | POA: Insufficient documentation

## 2018-10-29 DIAGNOSIS — Z87891 Personal history of nicotine dependence: Secondary | ICD-10-CM | POA: Diagnosis not present

## 2018-10-29 DIAGNOSIS — E039 Hypothyroidism, unspecified: Secondary | ICD-10-CM | POA: Insufficient documentation

## 2018-10-29 DIAGNOSIS — Z833 Family history of diabetes mellitus: Secondary | ICD-10-CM | POA: Insufficient documentation

## 2018-10-29 DIAGNOSIS — Z885 Allergy status to narcotic agent status: Secondary | ICD-10-CM | POA: Diagnosis not present

## 2018-10-29 DIAGNOSIS — E7800 Pure hypercholesterolemia, unspecified: Secondary | ICD-10-CM | POA: Insufficient documentation

## 2018-10-29 DIAGNOSIS — Z955 Presence of coronary angioplasty implant and graft: Secondary | ICD-10-CM | POA: Diagnosis not present

## 2018-10-29 DIAGNOSIS — Y832 Surgical operation with anastomosis, bypass or graft as the cause of abnormal reaction of the patient, or of later complication, without mention of misadventure at the time of the procedure: Secondary | ICD-10-CM | POA: Insufficient documentation

## 2018-10-29 DIAGNOSIS — Z79899 Other long term (current) drug therapy: Secondary | ICD-10-CM | POA: Insufficient documentation

## 2018-10-29 DIAGNOSIS — T82868A Thrombosis of vascular prosthetic devices, implants and grafts, initial encounter: Secondary | ICD-10-CM | POA: Insufficient documentation

## 2018-10-29 DIAGNOSIS — Z992 Dependence on renal dialysis: Secondary | ICD-10-CM | POA: Diagnosis not present

## 2018-10-29 DIAGNOSIS — Z794 Long term (current) use of insulin: Secondary | ICD-10-CM | POA: Insufficient documentation

## 2018-10-29 DIAGNOSIS — Z9852 Vasectomy status: Secondary | ICD-10-CM | POA: Diagnosis not present

## 2018-10-29 DIAGNOSIS — I132 Hypertensive heart and chronic kidney disease with heart failure and with stage 5 chronic kidney disease, or end stage renal disease: Secondary | ICD-10-CM | POA: Insufficient documentation

## 2018-10-29 DIAGNOSIS — N186 End stage renal disease: Secondary | ICD-10-CM

## 2018-10-29 DIAGNOSIS — H269 Unspecified cataract: Secondary | ICD-10-CM | POA: Diagnosis not present

## 2018-10-29 DIAGNOSIS — G4733 Obstructive sleep apnea (adult) (pediatric): Secondary | ICD-10-CM | POA: Diagnosis not present

## 2018-10-29 DIAGNOSIS — K589 Irritable bowel syndrome without diarrhea: Secondary | ICD-10-CM | POA: Insufficient documentation

## 2018-10-29 DIAGNOSIS — E1140 Type 2 diabetes mellitus with diabetic neuropathy, unspecified: Secondary | ICD-10-CM | POA: Insufficient documentation

## 2018-10-29 DIAGNOSIS — E1122 Type 2 diabetes mellitus with diabetic chronic kidney disease: Secondary | ICD-10-CM | POA: Diagnosis not present

## 2018-10-29 DIAGNOSIS — I252 Old myocardial infarction: Secondary | ICD-10-CM | POA: Insufficient documentation

## 2018-10-29 DIAGNOSIS — I509 Heart failure, unspecified: Secondary | ICD-10-CM | POA: Diagnosis not present

## 2018-10-29 DIAGNOSIS — Z8674 Personal history of sudden cardiac arrest: Secondary | ICD-10-CM | POA: Diagnosis not present

## 2018-10-29 DIAGNOSIS — M1990 Unspecified osteoarthritis, unspecified site: Secondary | ICD-10-CM | POA: Insufficient documentation

## 2018-10-29 HISTORY — PX: IR US GUIDE VASC ACCESS LEFT: IMG2389

## 2018-10-29 HISTORY — PX: IR DIALY SHUNT INTRO NEEDLE/INTRACATH INITIAL W/IMG LEFT: IMG6102

## 2018-10-29 LAB — BASIC METABOLIC PANEL
Anion gap: 22 — ABNORMAL HIGH (ref 5–15)
BUN: 82 mg/dL — ABNORMAL HIGH (ref 8–23)
CO2: 19 mmol/L — ABNORMAL LOW (ref 22–32)
Calcium: 8.2 mg/dL — ABNORMAL LOW (ref 8.9–10.3)
Chloride: 98 mmol/L (ref 98–111)
Creatinine, Ser: 11.73 mg/dL — ABNORMAL HIGH (ref 0.61–1.24)
GFR calc Af Amer: 4 mL/min — ABNORMAL LOW (ref >60)
GFR calc non Af Amer: 4 mL/min — ABNORMAL LOW (ref >60)
Glucose, Bld: 122 mg/dL — ABNORMAL HIGH (ref 70–99)
Potassium: 4.7 mmol/L (ref 3.5–5.1)
Sodium: 139 mmol/L (ref 135–145)

## 2018-10-29 LAB — GLUCOSE, CAPILLARY: Glucose-Capillary: 127 mg/dL — ABNORMAL HIGH (ref 70–99)

## 2018-10-29 LAB — CBC
HCT: 35.9 % — ABNORMAL LOW (ref 39.0–52.0)
Hemoglobin: 11.2 g/dL — ABNORMAL LOW (ref 13.0–17.0)
MCH: 27.1 pg (ref 26.0–34.0)
MCHC: 31.2 g/dL (ref 30.0–36.0)
MCV: 86.7 fL (ref 80.0–100.0)
Platelets: 224 10*3/uL (ref 150–400)
RBC: 4.14 MIL/uL — ABNORMAL LOW (ref 4.22–5.81)
RDW: 17.2 % — ABNORMAL HIGH (ref 11.5–15.5)
WBC: 7.4 10*3/uL (ref 4.0–10.5)
nRBC: 0 % (ref 0.0–0.2)

## 2018-10-29 LAB — PROTIME-INR
INR: 1 (ref 0.8–1.2)
Prothrombin Time: 13.3 seconds (ref 11.4–15.2)

## 2018-10-29 LAB — APTT: aPTT: 32 seconds (ref 24–36)

## 2018-10-29 MED ORDER — SODIUM CHLORIDE 0.9 % IV SOLN: INTRAVENOUS | Status: DC

## 2018-10-29 MED ORDER — IOHEXOL 300 MG/ML  SOLN
100.0000 mL | Freq: Once | INTRAMUSCULAR | Status: AC | PRN
  Administered 2018-10-29: 20 mL via INTRAVENOUS

## 2018-10-29 MED ORDER — MIDAZOLAM HCL 2 MG/2ML IJ SOLN
INTRAMUSCULAR | Status: AC
  Filled 2018-10-29: qty 2

## 2018-10-29 MED ORDER — LIDOCAINE HCL 1 % IJ SOLN
INTRAMUSCULAR | Status: AC
  Filled 2018-10-29: qty 20

## 2018-10-29 MED ORDER — LIDOCAINE HCL (PF) 1 % IJ SOLN
INTRAMUSCULAR | Status: AC | PRN
  Administered 2018-10-29: 10 mL

## 2018-10-29 MED ORDER — SODIUM CHLORIDE 0.9 % IV SOLN
INTRAVENOUS | Status: AC | PRN
  Administered 2018-10-29: 10 mL/h via INTRAVENOUS

## 2018-10-29 MED ORDER — FENTANYL CITRATE (PF) 100 MCG/2ML IJ SOLN
INTRAMUSCULAR | Status: AC
  Filled 2018-10-29: qty 2

## 2018-10-29 NOTE — Discharge Instructions (Addendum)
Dialysis Vascular Access Malfunction        A vascular access is an entrance to your blood vessels that can be used for dialysis. Dialysis is a treatment used for kidney failure. A health care provider can make a vascular access in many ways, such as by:  Joining an artery to a vein under your skin to make a bigger blood vessel called a fistula.  Joining an artery to a vein under your skin using a soft tube called a graft.  Placing a thin, flexible tube (catheter) in a large vein, usually in your neck, chest, or groin. A vascular access can become blocked or stop working correctly (malfunction). What can cause my vascular access to malfunction?  Infection. This is the most common cause of malfunction.  A blood clot inside a part of the fistula, graft, or catheter. A blood clot can completely or partially block the flow of blood.  A kink in the graft or catheter.  A collection of blood (hematoma or bruise) next to the graft or catheter that pushes against it, blocking the flow of blood. What are signs and symptoms of vascular access malfunction? Signs and symptoms of vascular access malfunction include:  A change in the vibration or pulse (thrill) of your fistula or graft.  The thrill of your fistula or graft being gone.  New or unusual swelling of the area around the access.  An unsuccessful puncture of your access by the dialysis team.  The flow of blood through the fistula, graft, or catheter being too slow for effective dialysis.  Bleeding that cannot be easily controlled when routine dialysis is completed and the needle is removed.  Signs of infection such as pain, swelling, redness, red streaks, numbness, and blood or pus coming from or around the access. What happens if my vascular access malfunctions? If your vascular access malfunctions, your health care provider may order blood work, cultures, or an X-ray test to find out what went wrong. The X-ray test involves the  injection of a dye (contrast) into the vascular access. The contrast shows up on the X-ray and lets your health care provider see if there is a blockage in the vascular access. Treatment varies depending on the cause of the malfunction:  If the vascular access is infected, your health care provider may prescribe antibiotic medicine to control the infection.  If a clot is found in the vascular access, you may need surgery to remove the clot. Blood thinning medicines may also be used.  If a blockage in the vascular access is due to some other cause, such as a kink in a graft, you will likely need surgery to unblock or replace the graft.  If there is a malfunction for any reason, your health care provider may remove and replace your access. Follow these instructions at home: Medicines  Take over-the-counter and prescription medicines only as told by your health care provider.  If you were prescribed an antibiotic medicine, use it as told by your health care provider. Do not stop using the antibiotic even if you start to feel better. Activity  Do not lift heavy things with the arm that has the access.  Try not to bump or hurt the arm that has the access. Lifestyle  Do not wear jewelry or tight clothing around the access.  Do not sleep by placing the access arm under your head or body. General instructions  Wash your hands with soap and water before touching the area around the   vascular access. If soap and water are not available, use hand sanitizer.  Keep all follow-up visits as told by your health care provider. This is very important. Any delay in follow-up could cause permanent dysfunction of the vascular access, which may be dangerous.  Do not measure your blood pressure on the arm that has the access.  Keep the access area clean and do not use makeup, creams, lotions, or ointments on it.  Check your access area every day for signs of infection. Check for: ? More redness,  swelling, or pain. ? More fluid or blood. ? Warmth. ? Pus or a bad smell. Contact a health care provider if:  Swelling around the vascular access gets worse.  You develop new pain. Get help right away if:  You have bleeding at the vascular access that cannot be easily controlled.  You have pain, numbness, an unusual pale skin color, or blue fingers or sores at the tips of your fingers in the hand on the side of your fistula.  You have chills.  You have a fever.  You have pus or other fluid (drainage) at the vascular access site.  You develop skin redness or red streaking on the skin around, above, or below the vascular access.  Your access is hot, swollen, red, and very painful.  You can suddenly see the cuff of your catheter used in the access. Summary  A vascular access is an entrance to your blood vessels that can be used for dialysis.  Several things can cause your vascular access to malfunction.  Treatment varies depending on the cause of the malfunction.  Keep all follow-up visits as told by your health care provider. Any delay in follow-up could cause permanent dysfunction of the vascular access, which may be dangerous. This information is not intended to replace advice given to you by your health care provider. Make sure you discuss any questions you have with your health care provider. Document Released: 04/23/2006 Document Revised: 06/15/2016 Document Reviewed: 06/15/2016 Elsevier Interactive Patient Education  2019 [place]. Moderate Conscious Sedation, Adult, Care After These instructions provide you with information about caring for yourself after your procedure. Your health care provider may also give you more specific instructions. Your treatment has been planned according to current medical practices, but problems sometimes occur. Call your health care provider if you have any problems or questions after your procedure. What can I expect after the  procedure? After your procedure, it is common:  To feel sleepy for several hours.  To feel clumsy and have poor balance for several hours.  To have poor judgment for several hours.  To vomit if you eat too soon. Follow these instructions at home: For at least 24 hours after the procedure:   Do not: ? Participate in activities where you could fall or become injured. ? Drive. ? Use heavy machinery. ? Drink alcohol. ? Take sleeping pills or medicines that cause drowsiness. ? Make important decisions or sign legal documents. ? Take care of children on your own.  Rest. Eating and drinking  Follow the diet recommended by your health care provider.  If you vomit: ? Drink water, juice, or soup when you can drink without vomiting. ? Make sure you have little or no nausea before eating solid foods. General instructions  Have a responsible adult stay with you until you are awake and alert.  Take over-the-counter and prescription medicines only as told by your health care provider.  If you smoke, do not  smoke without supervision.  Keep all follow-up visits as told by your health care provider. This is important. Contact a health care provider if:  You keep feeling nauseous or you keep vomiting.  You feel light-headed.  You develop a rash.  You have a fever. Get help right away if:  You have trouble breathing. This information is not intended to replace advice given to you by your health care provider. Make sure you discuss any questions you have with your health care provider. Document Released: 03/11/2013 Document Revised: 10/24/2015 Document Reviewed: 09/10/2015 Elsevier Interactive Patient Education  2019 [employer].

## 2018-10-29 NOTE — Sedation Documentation (Signed)
Dr. Anselm Pancoast did not intervene and writer did not sedate/ will d/c from IR

## 2018-10-29 NOTE — Telephone Encounter (Signed)
This patient evidently clotted his fistula over the weekend and Orin called the IR department for a declot procedure today. Joy at [facility] has instructed me to cancel the fistulogram that we had scheduled for tomorrow with Dr. Carlis Abbott. She will inform patient when he comes to HD later today after his IR procedure.

## 2018-10-29 NOTE — H&P (Signed)
Chief Complaint: Patient was seen in consultation today for left forearm dialysis access evaluation/ possible intervention at the request of [place]  Referring Physician(s): Fran Lowes  Supervising Physician: Markus Daft  Patient Status: [facility] - Out-pt  History of Present Illness: Thomas [name] is a 78 y.o. male   ESRD Left forearm dialysis access - placed 2015 Has never had to have any work done on this access Last month or so has had slow flow Last use Friday-- completed dialysis Monday was unable to get flow -- clotted  Now scheduled for evaluation and possible intervention- possible thrombolysis  Past Medical History:  Diagnosis Date  . Anemia   . Arthritis    hands, back  . Cardiac arrest ([place])   . Cataract   . CHF (congestive heart failure) ([place])   . Chronic kidney disease    CKD, hemodialysis Tues/Thurs/Sat  . COPD (chronic obstructive pulmonary disease) ([place])   . Diabetes mellitus ([place])   . Diverticulosis 2013   tcs by RMR  . Diverticulosis   . Erosive esophagitis   . GERD (gastroesophageal reflux disease)   . Hiatal hernia    pt not aware of this  . HTN (hypertension)   . Hypercholesterolemia   . Hypothyroidism   . Irritable bowel syndrome (IBS)   . Lymphocytic colitis 2013   tcs by RMR  . Myocardial infarction ([place])    X2  . Nasal mass   . Neuropathy   . Pneumonia   . Schatzki's ring   . Shortness of breath   . Sleep apnea    uses O2 only  . Tobacco abuse     Past Surgical History:  Procedure Laterality Date  . AV FISTULA PLACEMENT Left 06/29/2013   Procedure: LEFT RADIAL/CEPHALIC FISTULA;  Surgeon: Conrad Mobridge, MD;  Location: [place];  Service: Vascular;  Laterality: Left;  . cardiac stents     5  . CATARACT EXTRACTION W/PHACO Left 05/17/2014   Procedure: CATARACT EXTRACTION PHACO AND INTRAOCULAR LENS PLACEMENT (IOC);  Surgeon: Tonny Branch, MD;  Location: [facility];  Service: Ophthalmology;  Laterality: Left;  CDE:7.71  .  CATARACT EXTRACTION W/PHACO Right 06/14/2014   Procedure: CATARACT EXTRACTION PHACO AND INTRAOCULAR LENS PLACEMENT RIGHT EYE;  Surgeon: Tonny Branch, MD;  Location: [facility];  Service: Ophthalmology;  Laterality: Right;  CDE:8.22  . CHOLECYSTECTOMY    . COLONOSCOPY  December 2007   adenomatous polyps, sigmoid diverticula  . COLONOSCOPY  02/06/2012   Dr. Gala Romney- colonic diverticulosis, bx consistent with lymphocytic colitis  . CORONARY ANGIOPLASTY     x5 stents  . ESOPHAGOGASTRODUODENOSCOPY  May 2009   RMR: non-critical Schatzki's ring, sp dilation with 28 F Maloney, small inlet patch, mild erosive relufx esophagitis  . ESOPHAGOGASTRODUODENOSCOPY N/A 07/15/2012   Dr. Gala Romney- hiatal hernia, duodenal ulcer with surrounding erosions. this may well be related to the "haziness" sen around the head of the pancreas and duodenum on recent noncontrast ct scan and may have much to do with his abdominal pain.  Marland Kitchen EXCISION NASAL MASS N/A 07/18/2015   Procedure: EXCISION OF NASAL MASS;  Surgeon: Leta Baptist, MD;  Location: [place];  Service: ENT;  Laterality: N/A;  . INSERTION OF DIALYSIS CATHETER Right 05/21/2013   Procedure: INSERTION OF DIALYSIS CATHETER;  Surgeon: Conrad Bruno, MD;  Location: [place];  Service: Vascular;  Laterality: Right;  Marland Kitchen VASECTOMY      Allergies: Ace inhibitors; Statins; and Codeine  Medications: Prior to Admission medications   Medication Sig  Start Date End Date Taking? Authorizing Provider  acetaminophen (TYLENOL) 650 MG CR tablet Take 1,300 mg by mouth 2 (two) times daily as needed for pain.   Yes [provider]  albuterol (PROVENTIL,VENTOLIN) 2 MG/5ML syrup Take 2 mg by mouth 2 (two) times a day.    Yes [provider]  albuterol (VENTOLIN HFA) 108 (90 Base) MCG/ACT inhaler Inhale 1-2 puffs into the lungs every 4 (four) hours as needed for wheezing or shortness of breath.   Yes [provider]  ALPRAZolam Duanne Moron) 1 MG tablet Take 1 mg by mouth at bedtime as  needed for sleep.  07/30/18  Yes [provider]  amLODipine (NORVASC) 10 MG tablet Take 10 mg by mouth daily.   Yes [provider]  b complex-vitamin c-folic acid (NEPHRO-VITE) 0.8 MG TABS tablet Take 1 tablet by mouth daily.   Yes [provider]  butalbital-acetaminophen-caffeine (FIORICET, ESGIC) 50-325-40 MG per tablet Take 1 tablet by mouth every 6 (six) hours as needed for headache.    Yes [provider]  calcium acetate (PHOSLO) 667 MG capsule Take 667-1,334 mg by mouth See admin instructions. TAKE 2 CAPS BY MOUTH 3 TIMES DAILY WITH MEALS. TAKE 1 CAP 2 TIMES DAILY WITH SNACKS   Yes [provider]  Calcium Carb-Cholecalciferol (CALCIUM + D3 PO) Take 1 tablet by mouth daily.    Yes [provider]  Cholecalciferol (VITAMIN D3) 50 MCG (2000 UT) capsule Take 2 capsules by mouth 2 (two) times daily.    Yes [provider]  dicyclomine (BENTYL) 20 MG tablet Take 20 mg by mouth 4 (four) times daily as needed for spasms.    Yes [provider]  diphenoxylate-atropine (LOMOTIL) 2.5-0.025 MG tablet Take 2 tablets by mouth 4 (four) times daily as needed for diarrhea or loose stools.   Yes [provider]  gabapentin (NEURONTIN) 300 MG capsule Take 300 mg by mouth 3 (three) times daily as needed (for pain. Pt does take 300 mg every morning).    Yes [provider]  hydrochlorothiazide (HYDRODIURIL) 25 MG tablet Take 25 mg by mouth daily.   Yes [provider]  insulin lispro (HUMALOG) 100 UNIT/ML injection Inject 10 Units into the skin 3 (three) times daily with meals.    Yes [provider]  isosorbide mononitrate (IMDUR) 30 MG 24 hr tablet Take 30 mg by mouth daily.  03/03/12  Yes [provider]  LANTUS SOLOSTAR 100 UNIT/ML injection Inject 45 Units into the skin at bedtime. According to sugar level.   Yes [provider]  losartan (COZAAR) 100 MG tablet Take 100 mg by mouth  daily.  02/19/18  Yes [provider]  magnesium oxide (MAG-OX) 400 MG tablet Take 400 mg by mouth daily.   Yes [provider]  metoprolol (LOPRESSOR) 100 MG tablet Take 100 mg by mouth 2 (two) times daily.  12/07/11  Yes [provider]  Omega-3 Fatty Acids (FISH OIL) 1000 MG CAPS Take 2,000 mg by mouth 2 (two) times a day.   Yes [provider]  omeprazole (PRILOSEC) 20 MG capsule Take 20 mg by mouth 2 (two) times daily before a meal.   Yes [provider]  ondansetron (ZOFRAN) 4 MG tablet Take 4 mg by mouth every 8 (eight) hours as needed for nausea or vomiting.   Yes [provider]  primidone (MYSOLINE) 50 MG tablet Take 50 mg by mouth at bedtime. 06/17/18  Yes [provider]  STIOLTO RESPIMAT 2.5-2.5 MCG/ACT AERS Inhale 2 puffs into the lungs at bedtime. 09/24/18  Yes [provider]  vitamin B-12 (CYANOCOBALAMIN) 1000 MCG tablet Take 1,000 mcg by mouth daily.   Yes [provider]  NITROSTAT 0.4 MG SL tablet Place 0.4 mg under the tongue every 5 (five) minutes as needed for chest pain.  06/01/13   [provider]     Family History  Problem Relation Age of Onset  . Cancer Mother   . Cancer Father   . Diabetes Brother   . Cancer Brother   . Diabetes Sister   . Cancer Sister   . Cancer Sister   . Colon cancer Neg Hx     Social History   Socioeconomic History  . Marital status: Married    Spouse name: Not on file  . Number of children: 3  . Years of education: Not on file  . Highest education level: Not on file  Occupational History  . 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 Financial resource strain: Not on file  . Food insecurity:    Worry: Not on file    Inability: Not on file  . Transportation needs:    Medical: Not on file    Non-medical: Not on file  Tobacco Use  . Smoking status: Former Smoker    Packs/day: 0.00    Years: 60.00    Pack years: 0.00    Types:  E-cigarettes    Last attempt to quit: 11/10/2014    Years since quitting: 3.9  . Smokeless tobacco: Never Used  Substance and Sexual Activity  . Alcohol use: No  . Drug use: No  . Sexual activity: Not on file  Lifestyle  . Physical activity:    Days per week: Not on file    Minutes per session: Not on file  . Stress: Not on file  Relationships  . Social connections:    Talks on phone: Not on file    Gets together: Not on file    Attends religious service: Not on file    Active member of club or organization: Not on file    Attends meetings of clubs or organizations: Not on file    Relationship status: Not on file  Other Topics Concern  . Not on file  Social History Narrative  . Not on file    Review of Systems: A 12 point ROS discussed and pertinent positives are indicated in the HPI above.  All other systems are negative.  Review of Systems  Constitutional: Negative for activity change, fatigue and fever.  Respiratory: Negative for cough and shortness of breath.   Cardiovascular: Negative for chest pain.  Neurological: Negative for weakness.  Psychiatric/Behavioral: Negative for behavioral problems and confusion.    Vital Signs: BP (!) 143/60   Pulse 62   Temp (!) 97.5 F (36.4 C) (Oral)   Resp 18   Ht 5\' 11"  (1.803 m)   Wt 245 lb (111.1 kg)   SpO2 95%   BMI 34.17 kg/m   Physical Exam Vitals signs reviewed.  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     Heart sounds: Normal heart sounds.  Pulmonary:     Breath sounds: Normal breath sounds.  Abdominal:     General: Bowel sounds are normal.  Musculoskeletal: Normal  range of motion.     Comments: Left arm dialysis access + thrill- distally Faint pulse  Skin:    General: Skin is warm and dry.  Neurological:     Mental Status: He is alert and oriented to person, place, and  time.  Psychiatric:        Mood and Affect: Mood normal.        Behavior: Behavior normal.        Thought Content: Thought content normal.        Judgment: Judgment normal.     Imaging: No results found.  Labs:  CBC: Recent Labs    10/29/18 0940  WBC 7.4  HGB 11.2*  HCT 35.9*  PLT 224    COAGS: Recent Labs    10/29/18 0940  INR 1.0  APTT 32    BMP: No results for input(s): NA, K, CL, CO2, GLUCOSE, BUN, CALCIUM, CREATININE, GFRNONAA, GFRAA in the last 8760 hours.  Invalid input(s): CMP  LIVER FUNCTION TESTS: No results for input(s): BILITOT, AST, ALT, ALKPHOS, PROT, ALBUMIN in the last 8760 hours.  TUMOR MARKERS: No results for input(s): AFPTM, CEA, CA199, CHROMGRNA in the last 8760 hours.  Assessment and Plan:  Left arm dialysis access- slow to no flow-- possibly clotted Scheduled for evaluation and possible intervention/thrombolysis Possible tunneled dialysis catheter placement Risks and benefits discussed with the patient including, but not limited to bleeding, infection, vascular injury, pulmonary embolism, need for tunneled HD catheter placement or even death.  All of the patient's questions were answered, patient is agreeable to proceed. Consent signed and in chart.   Thank you for this interesting consult.  I greatly enjoyed meeting Thomas [name] and look forward to participating in their care.  A copy of this report was sent to the requesting provider on this date.  Electronically Signed: Lavonia Drafts, PA-C 10/29/2018, 10:38 AM   I spent a total of  30 Minutes   in face to face in clinical consultation, greater than 50% of which was counseling/coordinating care for left arm dialysis access evaluation/intervention

## 2018-10-29 NOTE — Procedures (Signed)
Interventional Radiology Procedure:   Indications: Concern for left arm AV fistula thrombosis.  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  Small amount of contrast extravasation at end of procedure.    Complications: Small amount of contrast extravasation, less than 10 ml     EBL: less than 10 ml  Plan: Marked the vein segment with adherent clot and recommend avoiding this area in the near future.   Reportedly, patient is scheduled to follow up with Vascular Surgery and they can assess if surgical intervention is needed.     Aeon Koors R. Anselm Pancoast, MD  Pager: [phone]

## 2018-10-30 ENCOUNTER — Encounter (HOSPITAL_COMMUNITY): Admission: RE | Payer: Self-pay | Source: Home / Self Care

## 2018-10-30 ENCOUNTER — Ambulatory Visit (HOSPITAL_COMMUNITY): Admission: RE | Admit: 2018-10-30 | Payer: Medicare Other | Source: Home / Self Care | Admitting: Vascular Surgery

## 2018-10-30 SURGERY — A/V FISTULAGRAM
Anesthesia: LOCAL

## 2018-11-04 ENCOUNTER — Telehealth: Payer: Self-pay

## 2018-11-04 ENCOUNTER — Other Ambulatory Visit: Payer: Self-pay | Admitting: Radiology

## 2018-11-04 ENCOUNTER — Other Ambulatory Visit (HOSPITAL_COMMUNITY): Payer: Self-pay | Admitting: Medical

## 2018-11-04 DIAGNOSIS — Z992 Dependence on renal dialysis: Secondary | ICD-10-CM

## 2018-11-04 DIAGNOSIS — N186 End stage renal disease: Secondary | ICD-10-CM

## 2018-11-04 NOTE — Telephone Encounter (Signed)
Joy with [facility] called regarding patient and said that they needed to speak with someone because this patient needed something done immediately. She said that he has not had a good dialysis treatment since the 22nd of May. She said that they had issues on the 27th and send him to IR but they did not do anything and told him to follow up here with [place]. She said that they were not able to dialyze him at all yesterday.   Spoke with Zigmund Daniel and she said that Wilfred Curtis was working on getting the patient in for a visit with a PA and studies. Spoke with [place] and she said that IR said that there was one area that needed to be avoided in his access but he was still able to use it. Advised her that today - Caryl Asp is saying that is not an option.   Spoke with Zigmund Daniel again as well and she suggest to have their physician call the oncall dr and discuss with him the concerns and determine what the best option is for immediate results.  Advised Joy that they should call IR as well and see if they can get him back in today as something may have changed since the last visit and he may be able to be declotted. She asked if she should call Dr Scot Dock first and I told her to call IR first to see if they can get him in today and then have their physician call Dr Scot Dock to discuss.   York Cerise, CMA

## 2018-11-05 ENCOUNTER — Encounter (HOSPITAL_COMMUNITY): Payer: Self-pay

## 2018-11-05 ENCOUNTER — Other Ambulatory Visit (HOSPITAL_COMMUNITY): Payer: Self-pay | Admitting: Medical

## 2018-11-05 ENCOUNTER — Other Ambulatory Visit: Payer: Self-pay

## 2018-11-05 ENCOUNTER — Ambulatory Visit (HOSPITAL_COMMUNITY)
Admission: RE | Admit: 2018-11-05 | Discharge: 2018-11-05 | Disposition: A | Discharge status: Home / Self Care | Payer: Medicare Other | Source: Ambulatory Visit | Attending: Medical | Admitting: Medical

## 2018-11-05 DIAGNOSIS — E785 Hyperlipidemia, unspecified: Secondary | ICD-10-CM | POA: Insufficient documentation

## 2018-11-05 DIAGNOSIS — J449 Chronic obstructive pulmonary disease, unspecified: Secondary | ICD-10-CM | POA: Insufficient documentation

## 2018-11-05 DIAGNOSIS — M1990 Unspecified osteoarthritis, unspecified site: Secondary | ICD-10-CM | POA: Insufficient documentation

## 2018-11-05 DIAGNOSIS — Z833 Family history of diabetes mellitus: Secondary | ICD-10-CM | POA: Diagnosis not present

## 2018-11-05 DIAGNOSIS — N186 End stage renal disease: Secondary | ICD-10-CM

## 2018-11-05 DIAGNOSIS — I509 Heart failure, unspecified: Secondary | ICD-10-CM | POA: Insufficient documentation

## 2018-11-05 DIAGNOSIS — Z955 Presence of coronary angioplasty implant and graft: Secondary | ICD-10-CM | POA: Insufficient documentation

## 2018-11-05 DIAGNOSIS — Z79899 Other long term (current) drug therapy: Secondary | ICD-10-CM | POA: Insufficient documentation

## 2018-11-05 DIAGNOSIS — E1140 Type 2 diabetes mellitus with diabetic neuropathy, unspecified: Secondary | ICD-10-CM | POA: Insufficient documentation

## 2018-11-05 DIAGNOSIS — E039 Hypothyroidism, unspecified: Secondary | ICD-10-CM | POA: Insufficient documentation

## 2018-11-05 DIAGNOSIS — K589 Irritable bowel syndrome without diarrhea: Secondary | ICD-10-CM | POA: Insufficient documentation

## 2018-11-05 DIAGNOSIS — Z8674 Personal history of sudden cardiac arrest: Secondary | ICD-10-CM | POA: Diagnosis not present

## 2018-11-05 DIAGNOSIS — Z794 Long term (current) use of insulin: Secondary | ICD-10-CM | POA: Diagnosis not present

## 2018-11-05 DIAGNOSIS — K219 Gastro-esophageal reflux disease without esophagitis: Secondary | ICD-10-CM | POA: Diagnosis not present

## 2018-11-05 DIAGNOSIS — Z888 Allergy status to other drugs, medicaments and biological substances status: Secondary | ICD-10-CM | POA: Diagnosis not present

## 2018-11-05 DIAGNOSIS — E1122 Type 2 diabetes mellitus with diabetic chronic kidney disease: Secondary | ICD-10-CM | POA: Insufficient documentation

## 2018-11-05 DIAGNOSIS — I252 Old myocardial infarction: Secondary | ICD-10-CM | POA: Diagnosis not present

## 2018-11-05 DIAGNOSIS — Z992 Dependence on renal dialysis: Secondary | ICD-10-CM | POA: Insufficient documentation

## 2018-11-05 DIAGNOSIS — H269 Unspecified cataract: Secondary | ICD-10-CM | POA: Insufficient documentation

## 2018-11-05 DIAGNOSIS — Z9852 Vasectomy status: Secondary | ICD-10-CM | POA: Insufficient documentation

## 2018-11-05 DIAGNOSIS — Z87891 Personal history of nicotine dependence: Secondary | ICD-10-CM | POA: Diagnosis not present

## 2018-11-05 DIAGNOSIS — I132 Hypertensive heart and chronic kidney disease with heart failure and with stage 5 chronic kidney disease, or end stage renal disease: Secondary | ICD-10-CM | POA: Diagnosis not present

## 2018-11-05 DIAGNOSIS — G4730 Sleep apnea, unspecified: Secondary | ICD-10-CM | POA: Diagnosis not present

## 2018-11-05 DIAGNOSIS — Z885 Allergy status to narcotic agent status: Secondary | ICD-10-CM | POA: Diagnosis not present

## 2018-11-05 HISTORY — PX: IR US GUIDE VASC ACCESS RIGHT: IMG2390

## 2018-11-05 HISTORY — PX: IR FLUORO GUIDE CV LINE RIGHT: IMG2283

## 2018-11-05 LAB — PROTIME-INR
INR: 1 (ref 0.8–1.2)
Prothrombin Time: 12.6 seconds (ref 11.4–15.2)

## 2018-11-05 LAB — BASIC METABOLIC PANEL
Anion gap: 21 — ABNORMAL HIGH (ref 5–15)
BUN: 96 mg/dL — ABNORMAL HIGH (ref 8–23)
CO2: 20 mmol/L — ABNORMAL LOW (ref 22–32)
Calcium: 8 mg/dL — ABNORMAL LOW (ref 8.9–10.3)
Chloride: 97 mmol/L — ABNORMAL LOW (ref 98–111)
Creatinine, Ser: 13.13 mg/dL — ABNORMAL HIGH (ref 0.61–1.24)
GFR calc Af Amer: 4 mL/min — ABNORMAL LOW (ref >60)
GFR calc non Af Amer: 3 mL/min — ABNORMAL LOW (ref >60)
Glucose, Bld: 137 mg/dL — ABNORMAL HIGH (ref 70–99)
Potassium: 6.1 mmol/L — ABNORMAL HIGH (ref 3.5–5.1)
Sodium: 138 mmol/L (ref 135–145)

## 2018-11-05 LAB — CBC
HCT: 33.7 % — ABNORMAL LOW (ref 39.0–52.0)
Hemoglobin: 10.3 g/dL — ABNORMAL LOW (ref 13.0–17.0)
MCH: 26.8 pg (ref 26.0–34.0)
MCHC: 30.6 g/dL (ref 30.0–36.0)
MCV: 87.8 fL (ref 80.0–100.0)
Platelets: 301 10*3/uL (ref 150–400)
RBC: 3.84 MIL/uL — ABNORMAL LOW (ref 4.22–5.81)
RDW: 16.7 % — ABNORMAL HIGH (ref 11.5–15.5)
WBC: 8.2 10*3/uL (ref 4.0–10.5)
nRBC: 0 % (ref 0.0–0.2)

## 2018-11-05 MED ORDER — FENTANYL CITRATE (PF) 100 MCG/2ML IJ SOLN
INTRAMUSCULAR | Status: AC | PRN
  Administered 2018-11-05: 12.5 ug via INTRAVENOUS

## 2018-11-05 MED ORDER — GELATIN ABSORBABLE 12-7 MM EX MISC
CUTANEOUS | Status: AC
  Filled 2018-11-05: qty 1

## 2018-11-05 MED ORDER — LIDOCAINE HCL 1 % IJ SOLN
INTRAMUSCULAR | Status: AC
  Filled 2018-11-05: qty 20

## 2018-11-05 MED ORDER — MIDAZOLAM HCL 2 MG/2ML IJ SOLN
INTRAMUSCULAR | Status: AC | PRN
  Administered 2018-11-05 (×2): 0.5 mg via INTRAVENOUS

## 2018-11-05 MED ORDER — SODIUM CHLORIDE 0.9 % IV SOLN: INTRAVENOUS | Status: DC

## 2018-11-05 MED ORDER — FENTANYL CITRATE (PF) 100 MCG/2ML IJ SOLN
INTRAMUSCULAR | Status: AC
  Filled 2018-11-05: qty 2

## 2018-11-05 MED ORDER — HEPARIN SODIUM (PORCINE) 1000 UNIT/ML IJ SOLN
INTRAMUSCULAR | Status: AC
  Filled 2018-11-05: qty 1

## 2018-11-05 MED ORDER — CEFAZOLIN SODIUM-DEXTROSE 2-4 GM/100ML-% IV SOLN
INTRAVENOUS | Status: AC
  Administered 2018-11-05: 2 g via INTRAVENOUS
  Filled 2018-11-05: qty 100

## 2018-11-05 MED ORDER — LIDOCAINE HCL (PF) 1 % IJ SOLN
INTRAMUSCULAR | Status: AC | PRN
  Administered 2018-11-05: 10 mL

## 2018-11-05 MED ORDER — CEFAZOLIN SODIUM-DEXTROSE 2-4 GM/100ML-% IV SOLN
2.0000 g | INTRAVENOUS | Status: AC
  Administered 2018-11-05: 2 g via INTRAVENOUS

## 2018-11-05 MED ORDER — MIDAZOLAM HCL 2 MG/2ML IJ SOLN
INTRAMUSCULAR | Status: AC
  Filled 2018-11-05: qty 2

## 2018-11-05 NOTE — Procedures (Signed)
esrd  S/p RT IJ HD CATH  TIP SVCRA NO COMP STABLE EBL MIN READY FOR USE FULL REPORT IN PACS

## 2018-11-05 NOTE — Discharge Instructions (Addendum)
Central Line Dialysis Access Placement, Care After This sheet gives you information about how to care for yourself after your procedure. Your health care provider may also give you more specific instructions. If you have problems or questions, contact your health care provider. What can I expect after the procedure? After the procedure, it is common to have:  Mild pain or discomfort.  Mild redness, swelling, or bruising around your incision.  A small amount of blood or clear fluid coming from your incision. Follow these instructions at home: Incision care   Follow instructions from your health care provider about how to take care of your incision. Make sure you: ? Wash your hands with soap and water before you change your bandage (dressing). If soap and water are not available, use hand sanitizer. ? Change your dressing as told by your health care provider. ? Leave stitches (sutures) in place.  Check your incision area every day for signs of infection. Check for: ? More redness, swelling, or pain. ? More fluid or blood. ? Warmth. ? Pus or a bad smell.  If directed, put heat on the catheter site as often as told by your health care provider. Use the heat source that your health care provider recommends, such as a moist heat pack or a heating pad. ? Place a towel between your skin and the heat source. ? Leave the heat on for 20-30 minutes. ? Remove the heat if your skin turns bright red. This is especially important if you are unable to feel pain, heat, or cold. You may have a greater risk of getting burned.  If directed, put ice on the catheter site: ? Put ice in a plastic bag. ? Place a towel between your skin and the bag. ? Leave the ice on for 20 minutes, 2-3 times a day. Medicines  Take over-the-counter and prescription medicines only as told by your health care provider.  If you were prescribed an antibiotic medicine, use it as told by your health care provider. Do not stop  using the antibiotic even if you start to feel better. Activity  Return to your normal activities as told by your health care provider. Ask your health care provider what activities are safe for you.  Do not lift anything that is heavier than 10 lb (4.5 kg) until your health care provider says that this is safe. Driving  Do not drive for 24 hours if you were given a medicine to help you relax (sedative) during your procedure.  Do not drive or use heavy machinery while taking prescription pain medicine. Lifestyle  Limit alcohol intake to no more than 1 drink a day for nonpregnant women and 2 drinks a day for men. One drink equals 12 oz of beer, 5 oz of wine, or 1 oz of hard liquor.  Do not use any products that contain nicotine or tobacco, such as cigarettes and e-cigarettes. If you need help quitting, ask your health care provider. General instructions  Do not take baths or showers, swim, or use a hot tub until your health care provider approves. You may only be allowed to take sponge baths for bathing.  Wear compression stockings as told by your health care provider. These stockings help to prevent blood clots and reduce swelling in your legs.  Follow instructions from your health care provider about eating or drinking restrictions.  Keep all follow-up visits as told by your health care provider. This is important. Contact a health care provider if:  Your   catheter gets pulled out of place.  Your catheter site becomes itchy.  You develop a rash around your catheter site.  You have more redness, swelling, or pain around your incision.  You have more fluid or blood coming from your incision.  Your incision area feels warm to the touch.  You have pus or a bad smell coming from your incision.  You have a fever. Get help right away if:  You become light-headed or dizzy.  You faint.  You have difficulty breathing.  Your catheter gets pulled out completely. This  information is not intended to replace advice given to you by your health care provider. Make sure you discuss any questions you have with your health care provider. Document Released: 01/03/2004 Document Revised: 02/13/2016 Document Reviewed: 02/13/2016 Elsevier Interactive Patient Education  2019 [employer]. Moderate Conscious Sedation, Adult, Care After These instructions provide you with information about caring for yourself after your procedure. Your health care provider may also give you more specific instructions. Your treatment has been planned according to current medical practices, but problems sometimes occur. Call your health care provider if you have any problems or questions after your procedure. What can I expect after the procedure? After your procedure, it is common:  To feel sleepy for several hours.  To feel clumsy and have poor balance for several hours.  To have poor judgment for several hours.  To vomit if you eat too soon. Follow these instructions at home: For at least 24 hours after the procedure:   Do not: ? Participate in activities where you could fall or become injured. ? Drive. ? Use heavy machinery. ? Drink alcohol. ? Take sleeping pills or medicines that cause drowsiness. ? Make important decisions or sign legal documents. ? Take care of children on your own.  Rest. Eating and drinking  Follow the diet recommended by your health care provider.  If you vomit: ? Drink water, juice, or soup when you can drink without vomiting. ? Make sure you have little or no nausea before eating solid foods. General instructions  Have a responsible adult stay with you until you are awake and alert.  Take over-the-counter and prescription medicines only as told by your health care provider.  If you smoke, do not smoke without supervision.  Keep all follow-up visits as told by your health care provider. This is important. Contact a health care provider  if:  You keep feeling nauseous or you keep vomiting.  You feel light-headed.  You develop a rash.  You have a fever. Get help right away if:  You have trouble breathing. This information is not intended to replace advice given to you by your health care provider. Make sure you discuss any questions you have with your health care provider. Document Released: 03/11/2013 Document Revised: 10/24/2015 Document Reviewed: 09/10/2015 Elsevier Interactive Patient Education  2019 [employer].

## 2018-11-05 NOTE — H&P (Addendum)
Chief Complaint: Patient was seen in consultation today for tunneled HD catheter placement at the request of Lyles,Charles  Referring Physician(s): Lyles,Charles  Supervising Physician: Daryll Brod  Patient Status: [facility] - Out-pt  History of Present Illness: Thomas [name] is a 78 y.o. male   ESRD Left forearm fistula declot just 5/27 Has not been able to use this access well since 5/22 per notes  Not able to use yesterday well at all  Request now for tunneled dialysis catheter placement   Past Medical History:  Diagnosis Date   Anemia    Arthritis    hands, back   Cardiac arrest ([place])    Cataract    CHF (congestive heart failure) ([place])    Chronic kidney disease    CKD, hemodialysis Tues/Thurs/Sat   COPD (chronic obstructive pulmonary disease) ([place])    Diabetes mellitus ([place])    Diverticulosis 2013   tcs by RMR   Diverticulosis    Erosive esophagitis    GERD (gastroesophageal reflux disease)    Hiatal hernia    pt not aware of this   HTN (hypertension)    Hypercholesterolemia    Hypothyroidism    Irritable bowel syndrome (IBS)    Lymphocytic colitis 2013   tcs by R[facility]   Myocardial infarction ([place])    X2   Nasal mass    Neuropathy    Pneumonia    Schatzki's ring    Shortness of breath    Sleep apnea    uses O2 only   Tobacco abuse     Past Surgical History:  Procedure Laterality Date   AV FISTULA PLACEMENT Left 06/29/2013   Procedure: LEFT RADIAL/CEPHALIC FISTULA;  Surgeon: Conrad Burden, MD;  Location: [place];  Service: Vascular;  Laterality: Left;   cardiac stents     5   CATARACT EXTRACTION W/PHACO Left 05/17/2014   Procedure: CATARACT EXTRACTION PHACO AND INTRAOCULAR LENS PLACEMENT ([place]);  Surgeon: Tonny Branch, MD;  Location: [facility];  Service: Ophthalmology;  Laterality: Left;  CDE:7.71   CATARACT EXTRACTION W/PHACO Right 06/14/2014   Procedure: CATARACT EXTRACTION PHACO AND INTRAOCULAR LENS PLACEMENT RIGHT EYE;   Surgeon: Tonny Branch, MD;  Location: [facility];  Service: Ophthalmology;  Laterality: Right;  CDE:8.22   CHOLECYSTECTOMY     COLONOSCOPY  December 2007   adenomatous polyps, sigmoid diverticula   COLONOSCOPY  02/06/2012   Dr. Gala Romney- colonic diverticulosis, bx consistent with lymphocytic colitis   CORONARY ANGIOPLASTY     x5 stents   ESOPHAGOGASTRODUODENOSCOPY  May 2009   RMR: non-critical Schatzki's ring, sp dilation with 61 F [place], small inlet patch, mild erosive relufx esophagitis   ESOPHAGOGASTRODUODENOSCOPY N/A 07/15/2012   Dr. Gala Romney- hiatal hernia, duodenal ulcer with surrounding erosions. this may well be related to the "haziness" sen around the head of the pancreas and duodenum on recent noncontrast ct scan and may have much to do with his abdominal pain.   EXCISION NASAL MASS N/A 07/18/2015   Procedure: EXCISION OF NASAL MASS;  Surgeon: Leta Baptist, MD;  Location: [place] [facility];  Service: ENT;  Laterality: N/A;   INSERTION OF DIALYSIS CATHETER Right 05/21/2013   Procedure: INSERTION OF DIALYSIS CATHETER;  Surgeon: Conrad Hammonton, MD;  Location: [place];  Service: Vascular;  Laterality: Right;   IR DIALY SHUNT INTRO [place] W/IMG LEFT Left 10/29/2018   IR US GUIDE VASC ACCESS LEFT  10/29/2018   VASECTOMY      Allergies: Ace inhibitors; Statins; and Codeine  Medications: Prior  to Admission medications   Medication Sig Start Date End Date Taking? Authorizing Provider  albuterol (PROVENTIL,VENTOLIN) 2 MG/5ML syrup Take 2 mg by mouth 2 (two) times a day.    Yes [provider]  ALPRAZolam Duanne Moron) 1 MG tablet Take 1 mg by mouth at bedtime as needed for sleep.  07/30/18  Yes [provider]  amLODipine (NORVASC) 10 MG tablet Take 10 mg by mouth daily.   Yes [provider]  b complex-vitamin c-folic acid (NEPHRO-VITE) 0.8 MG TABS tablet Take 1 tablet by mouth daily.   Yes [provider]  butalbital-acetaminophen-caffeine (FIORICET, ESGIC)  50-325-40 MG per tablet Take 1 tablet by mouth every 6 (six) hours as needed for headache.    Yes [provider]  calcium acetate (PHOSLO) 667 MG capsule Take 667-1,334 mg by mouth See admin instructions. TAKE 2 CAPS BY MOUTH 3 TIMES DAILY WITH MEALS. TAKE 1 CAP 2 TIMES DAILY WITH SNACKS   Yes [provider]  Calcium Carb-Cholecalciferol (CALCIUM + D3 PO) Take 1 tablet by mouth daily.    Yes [provider]  Cholecalciferol (VITAMIN D3) 50 MCG (2000 UT) capsule Take 2 capsules by mouth 2 (two) times daily.    Yes [provider]  dicyclomine (BENTYL) 20 MG tablet Take 20 mg by mouth 4 (four) times daily as needed for spasms.    Yes [provider]  diphenoxylate-atropine (LOMOTIL) 2.5-0.025 MG tablet Take 2 tablets by mouth 4 (four) times daily as needed for diarrhea or loose stools.   Yes [provider]  gabapentin (NEURONTIN) 300 MG capsule Take 300 mg by mouth 3 (three) times daily as needed (for pain. Pt does take 300 mg every morning).    Yes [provider]  hydrochlorothiazide (HYDRODIURIL) 25 MG tablet Take 25 mg by mouth daily.   Yes [provider]  insulin lispro (HUMALOG) 100 UNIT/ML injection Inject 10 Units into the skin 3 (three) times daily with meals.    Yes [provider]  isosorbide mononitrate (IMDUR) 30 MG 24 hr tablet Take 30 mg by mouth daily.  03/03/12  Yes [provider]  LANTUS SOLOSTAR 100 UNIT/ML injection Inject 45 Units into the skin at bedtime. According to sugar level.   Yes [provider]  losartan (COZAAR) 100 MG tablet Take 100 mg by mouth daily.  02/19/18  Yes [provider]  magnesium oxide (MAG-OX) 400 MG tablet Take 400 mg by mouth daily.   Yes [provider]  metoprolol (LOPRESSOR) 100 MG tablet Take 100 mg by mouth 2 (two) times daily.  12/07/11  Yes [provider]  Omega-3 Fatty Acids (FISH OIL) 1000 MG CAPS Take 2,000 mg by mouth 2  (two) times a day.   Yes [provider]  omeprazole (PRILOSEC) 20 MG capsule Take 20 mg by mouth 2 (two) times daily before a meal.   Yes [provider]  ondansetron (ZOFRAN) 4 MG tablet Take 4 mg by mouth every 8 (eight) hours as needed for nausea or vomiting.   Yes [provider]  primidone (MYSOLINE) 50 MG tablet Take 50 mg by mouth at bedtime. 06/17/18  Yes [provider]  STIOLTO RESPIMAT 2.5-2.5 MCG/ACT AERS Inhale 2 puffs into the lungs at bedtime. 09/24/18  Yes [provider]  vitamin B-12 (CYANOCOBALAMIN) 1000 MCG tablet Take 1,000 mcg by mouth daily.   Yes [provider]  acetaminophen (TYLENOL) 650 MG CR tablet Take 1,300 mg by mouth 2 (two)  times daily as needed for pain.    [provider]  albuterol (VENTOLIN HFA) 108 (90 Base) MCG/ACT inhaler Inhale 1-2 puffs into the lungs every 4 (four) hours as needed for wheezing or shortness of breath.    [provider]  NITROSTAT 0.4 MG SL tablet Place 0.4 mg under the tongue every 5 (five) minutes as needed for chest pain.  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 strain: Not on file   Food insecurity:    Worry: Not on file    Inability: Not on file   Transportation needs:    Medical: Not on file    Non-medical: Not on file  Tobacco Use   Smoking status: Former Smoker    Packs/day: 0.00    Years: 60.00    Pack years: 0.00    Types: E-cigarettes     Last attempt to quit: 11/10/2014    Years since quitting: 3.9   Smokeless tobacco: Never Used  Substance and Sexual Activity   Alcohol use: No   Drug use: No   Sexual activity: Not on file  Lifestyle   Physical activity:    Days per week: Not on file    Minutes per session: Not on file   Stress: Not on file  Relationships   Social connections:    Talks on phone: Not on file    Gets together: Not on file    Attends religious service: Not on file    Active member of club or organization: Not on file    Attends meetings of clubs or organizations: Not on file    Relationship status: Not on file  Other Topics Concern   Not on file  Social History Narrative   Not on file   Review of Systems: A 12 point ROS discussed and pertinent positives are indicated in the HPI above.  All other systems are negative.  Review of Systems  Constitutional: Negative for activity change, fatigue and fever.  Respiratory: Negative for cough and shortness of breath.   Cardiovascular: Negative for chest pain.  Gastrointestinal: Negative for abdominal pain.  Neurological: Negative for weakness.  Psychiatric/Behavioral: Negative for behavioral problems and confusion.    Vital Signs: BP (!) 148/63    Pulse (!) 57    Temp (!) 97.4 F (36.3 C) (Oral)    Resp 16    Ht 5\' 11"  (1.803 m)    Wt 210 lb (95.3 kg)    SpO2 94%    BMI 29.29 kg/m   Physical Exam Vitals signs reviewed.  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     Heart sounds: Normal heart sounds.  Pulmonary:     Breath sounds: Normal breath sounds.  Abdominal:     Palpations: Abdomen is soft.  Musculoskeletal: Normal range of motion.  Skin:    General: Skin is warm and dry.  Neurological:     Mental Status: He is alert and oriented to person, place, and time.  Psychiatric:        Mood and Affect: Mood normal.        Behavior:  Behavior normal.        Thought Content: Thought content normal.        Judgment: Judgment normal.     Imaging: Ir US Guide Vasc Access Left  Result Date: 10/29/2018 INDICATION: 78 year old with end-stage renal disease and left radiocephalic fistula. Concern for a thrombosed fistula. EXAM: LEFT UPPER EXTREMITY FISTULOGRAM ULTRASOUND GUIDANCE FOR VASCULAR ACCESS MEDICATIONS: None. ANESTHESIA/SEDATION: None FLUOROSCOPY TIME:  Fluoroscopy Time: 24 seconds, 18 mGy CONTRAST:  20 mL Omnipaque 250 COMPLICATIONS: None immediate. PROCEDURE: Informed written consent was obtained from the patient after a thorough discussion of the procedural risks, benefits and alternatives. All questions were addressed. Maximal Sterile Barrier Technique was utilized including caps, mask, sterile gowns, sterile gloves, sterile drape, hand hygiene and skin antiseptic. A timeout was performed prior to the initiation of the procedure. The left forearm AV fistula was identified with ultrasound. Skin was anesthetized with 1% lidocaine. Using ultrasound guidance, the distal cephalic vein was punctured using a 21 gauge needle and a micropuncture dilator set was placed. Fistulogram images were obtained. Unfortunately, the micropuncture catheter was dislodged while trying to obtain additional images of the distal forearm and reflux images. Small amount of contrast, less than 10 mL, extravasated into the soft tissues when the catheter was dislodged. No additional fistulogram images were obtained after the catheter was dislodged. Light manual compression was placed over the puncture site until there was hemostasis. Bandage placed at the puncture site. FINDINGS: Left arm fistula is patent. Arterial anastomosis appears to be widely patent based on ultrasound. 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 The cephalic vein is widely patent in  the left upper arm and the central veins are widely patent. 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 The thrombus is adherent to the superficial side of the vein and would explain why there is no flow or poor flow when this area is accessed. As a result, this aneurysm segment containing the adherent thrombus was demarcated with a pen. Recommend trying to access the fistula proximal or distal to this demarcated segment. Reportedly, the patient is scheduled to follow-up with vascular surgery. ACCESS: This access remains amenable to future percutaneous interventions as clinically indicated. Electronically Signed   By: Markus Daft M.D.   On: 10/29/2018 17:29   Ir Dialy Shunt Intro Needle/intracath Initial W/img Left  Result Date: 10/29/2018 INDICATION: 78 year old with end-stage renal disease and left radiocephalic fistula. Concern for a thrombosed fistula. EXAM: LEFT UPPER EXTREMITY FISTULOGRAM ULTRASOUND GUIDANCE FOR VASCULAR ACCESS MEDICATIONS: None. ANESTHESIA/SEDATION: None FLUOROSCOPY TIME:  Fluoroscopy Time: 24 seconds, 18 mGy CONTRAST:  20 mL Omnipaque 539 COMPLICATIONS: None immediate. PROCEDURE: Informed written consent was obtained from the patient after a thorough discussion of the procedural risks, benefits and alternatives. All questions were addressed. Maximal Sterile Barrier Technique was utilized including caps, mask, sterile gowns, sterile gloves, sterile drape, hand hygiene and skin antiseptic. A timeout was performed prior to the initiation of  the procedure. The left forearm AV fistula was identified with ultrasound. Skin was anesthetized with 1% lidocaine. Using ultrasound guidance, the distal cephalic vein was punctured using a 21 gauge needle and a micropuncture dilator set was placed. Fistulogram images were obtained. Unfortunately, the micropuncture catheter was dislodged while trying  to obtain additional images of the distal forearm and reflux images. Small amount of contrast, less than 10 mL, extravasated into the soft tissues when the catheter was dislodged. No additional fistulogram images were obtained after the catheter was dislodged. Light manual compression was placed over the puncture site until there was hemostasis. Bandage placed at the puncture site. FINDINGS: Left arm fistula is patent. Arterial anastomosis appears to be widely patent based on ultrasound. 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 The cephalic vein is widely patent in the left upper arm and the central veins are widely patent. 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 The thrombus is adherent to the superficial side of the vein and would explain why there is no flow or poor flow when this area is accessed. As a result, this aneurysm segment containing the adherent thrombus was demarcated with a pen. Recommend trying to access the fistula proximal or distal to this demarcated segment. Reportedly, the patient is scheduled to follow-up with vascular surgery. ACCESS: This access remains amenable to future percutaneous interventions as clinically indicated. Electronically Signed   By: Markus Daft M.D.   On: 10/29/2018 17:29    Labs:  CBC: Recent Labs    10/29/18 0940 11/05/18 0643  WBC 7.4 8.2  HGB 11.2* 10.3*  HCT 35.9* 33.7*  PLT 224 301    COAGS: Recent Labs    10/29/18 0940  INR 1.0  APTT 32    BMP: Recent Labs    10/29/18 0940  NA 139  K 4.7  CL 98  CO2 19*  GLUCOSE 122*  BUN 82*  CALCIUM 8.2*  CREATININE 11.73*  GFRNONAA 4*  GFRAA 4*    LIVER FUNCTION TESTS: No results for input(s): BILITOT, AST, ALT, ALKPHOS, PROT, ALBUMIN in the last 8760 hours.  TUMOR  MARKERS: No results for input(s): AFPTM, CEA, CA199, CHROMGRNA in the last 8760 hours.  Assessment and Plan:  Left arm dialysis fistula declotted 5/27 Have not been able to use it well at all per Ctr Now clotted again and request for tunneled dialysis catheter placement Risks and benefits discussed with the patient including, but not limited to bleeding, infection, vascular injury, pneumothorax which may require chest tube placement, air embolism or even death  All of the patient's questions were answered, patient is agreeable to proceed. Consent signed and in chart.   Thank you for this interesting consult.  I greatly enjoyed meeting VALDEMAR MCCLENAHAN and look forward to participating in their care.  A copy of this report was sent to the requesting provider on this date.  Electronically Signed: Lavonia Drafts, PA-C 11/05/2018, 7:18 AM   I spent a total of    25 Minutes in face to face in clinical consultation, greater than 50% of which was counseling/coordinating care for tunneled HD cath placement

## 2018-11-05 NOTE — Sedation Documentation (Signed)
IV removed, patient discharge home per orders. Discharge AVS reviewed with patient. Patient states understanding without any further instructions. Patient escorted to main entrance and discharged home with son in law.

## 2018-11-07 ENCOUNTER — Telehealth (HOSPITAL_COMMUNITY): Payer: Self-pay | Admitting: *Deleted

## 2018-11-07 NOTE — Telephone Encounter (Signed)
The above patient or their representative was contacted and gave the following answers to these questions:         Do you have any of the following symptoms?   Fever                    Cough          Rib pain         Shortness of breath  Do  you have any of the following other symptoms? no   muscle pain         vomiting,        diarrhea        rash         weakness        red eye        abdominal pain         bruising          bruising or bleeding              joint pain           severe headache    Have you been in contact with someone who was or has been sick in the past 2 weeks? no  Yes                 Unsure                         Unable to assess   Does the person that you were in contact with have any of the following symptoms?   Cough         shortness of breath           muscle pain         vomiting,            diarrhea            rash            weakness           fever            red eye           abdominal pain           bruising  or  bleeding                joint pain                severe headache               Have you  or someone you have been in contact with traveled internationally in th last month?       no  If yes, which countries?   Have you  or someone you have been in contact with traveled outside [place] in th last month?        no If yes, which state and city?   COMMENTS OR ACTION PLAN FOR THIS PATIENT:

## 2018-11-07 NOTE — Telephone Encounter (Signed)
Attempting to reach pt to confirm appts

## 2018-11-10 ENCOUNTER — Other Ambulatory Visit: Payer: Self-pay | Admitting: *Deleted

## 2018-11-10 ENCOUNTER — Encounter: Payer: Self-pay | Admitting: *Deleted

## 2018-11-10 ENCOUNTER — Other Ambulatory Visit: Payer: Self-pay

## 2018-11-10 ENCOUNTER — Ambulatory Visit (INDEPENDENT_AMBULATORY_CARE_PROVIDER_SITE_OTHER): Payer: Medicare Other | Admitting: Physician Assistant

## 2018-11-10 ENCOUNTER — Ambulatory Visit (HOSPITAL_COMMUNITY)
Admission: RE | Admit: 2018-11-10 | Discharge: 2018-11-10 | Disposition: A | Discharge status: Home / Self Care | Payer: Medicare Other | Source: Ambulatory Visit | Attending: Family | Admitting: Family

## 2018-11-10 VITALS — BP 138/65 | HR 73 | Temp 99.4°F | Ht 71.0 in | Wt 210.0 lb

## 2018-11-10 DIAGNOSIS — N186 End stage renal disease: Secondary | ICD-10-CM

## 2018-11-10 NOTE — Progress Notes (Signed)
Established Dialysis Access   History of Present Illness   Thomas [name] is a 78 y.o. ([birth year]) male who presents for re-evaluation of left radiocephalic fistula.  This was created by Dr. Bridgett Larsson in 2015.  Over the past several months he reports that the dialysis techs have been on and off pulling clots from fistula.  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  Last week he had a right IJ tunneled dialysis catheter placed as he had not had a full hemodialysis treatment over the past several weeks due to low flow.  Patient would like to salvage this fistula before creating a new dialysis access.  He is not taking any blood thinners.  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    Current Outpatient Medications  Medication Sig Dispense Refill  . acetaminophen (TYLENOL) 650 MG CR tablet Take 1,300 mg by mouth 2 (two) times daily as needed for pain.    Marland Kitchen albuterol (PROVENTIL,VENTOLIN) 2 MG/5ML syrup Take 2 mg by mouth 2 (two) times a day.     . albuterol (VENTOLIN HFA) 108 (90 Base) MCG/ACT inhaler Inhale 1-2 puffs into the lungs every 4 (four) hours as needed for wheezing or shortness of breath.    . ALPRAZolam (XANAX) 1 MG tablet Take 1 mg by mouth at bedtime as needed for sleep.     Marland Kitchen amLODipine (NORVASC) 10 MG tablet Take 10 mg by mouth daily.    Marland Kitchen b complex-vitamin c-folic acid (NEPHRO-VITE) 0.8 MG TABS tablet Take 1 tablet by mouth daily.    . butalbital-acetaminophen-caffeine (FIORICET, ESGIC) 50-325-40 MG per tablet Take 1 tablet by mouth every 6 (six) hours as needed for headache.     . calcium acetate (PHOSLO) 667 MG capsule Take 667-1,334 mg by mouth See admin instructions. TAKE 2 CAPS BY MOUTH 3 TIMES DAILY WITH MEALS. TAKE 1 CAP 2 TIMES DAILY WITH SNACKS    . Calcium Carb-Cholecalciferol (CALCIUM + D3  PO) Take 1 tablet by mouth daily.     . Cholecalciferol (VITAMIN D3) 50 MCG (2000 UT) capsule Take 2 capsules by mouth 2 (two) times daily.     Marland Kitchen dicyclomine (BENTYL) 20 MG tablet Take 20 mg by mouth 4 (four) times daily as needed for spasms.     . diphenoxylate-atropine (LOMOTIL) 2.5-0.025 MG tablet Take 2 tablets by mouth 4 (four) times daily as needed for diarrhea or loose stools.    . gabapentin (NEURONTIN) 300 MG capsule Take 300 mg by mouth 3 (three) times daily as needed (for pain. Pt does take 300 mg every morning).     . hydrochlorothiazide (HYDRODIURIL) 25 MG tablet Take 25 mg by mouth daily.    . insulin lispro (HUMALOG) 100 UNIT/ML injection Inject 10 Units into the skin 3 (three) times daily with meals.     . isosorbide mononitrate (IMDUR) 30 MG 24 hr tablet Take 30 mg by mouth daily.     Marland Kitchen LANTUS SOLOSTAR 100 UNIT/ML injection Inject 45 Units into the skin at bedtime. According to sugar level.    Marland Kitchen losartan (COZAAR) 100 MG tablet Take 100 mg by mouth daily.     . magnesium oxide (MAG-OX) 400 MG tablet Take 400 mg by mouth daily.    . metoprolol (LOPRESSOR) 100 MG tablet Take 100 mg by mouth 2 (two) times daily.     Marland Kitchen  NITROSTAT 0.4 MG SL tablet Place 0.4 mg under the tongue every 5 (five) minutes as needed for chest pain.     . Omega-3 Fatty Acids (FISH OIL) 1000 MG CAPS Take 2,000 mg by mouth 2 (two) times a day.    Marland Kitchen omeprazole (PRILOSEC) 20 MG capsule Take 20 mg by mouth 2 (two) times daily before a meal.    . ondansetron (ZOFRAN) 4 MG tablet Take 4 mg by mouth every 8 (eight) hours as needed for nausea or vomiting.    . primidone (MYSOLINE) 50 MG tablet Take 50 mg by mouth at bedtime.    Marland Kitchen STIOLTO RESPIMAT 2.5-2.5 MCG/ACT AERS Inhale 2 puffs into the lungs at bedtime.    . vitamin B-12 (CYANOCOBALAMIN) 1000 MCG tablet Take 1,000 mcg by mouth daily.     No current facility-administered medications for this visit.     On ROS today: 10 system ROS is negative unless otherwise  noted in HPI   Physical Examination   Vitals:   11/10/18 1333  BP: 138/65  Pulse: 73  Temp: 99.4 F (37.4 C)  TempSrc: Temporal  SpO2: 95%  Weight: 210 lb (95.3 kg)  Height: 5\' 11"  (1.803 m)   Body mass index is 29.29 kg/m.  General Alert, O x 3  Pulmonary Sym exp, good B air movt, CTA B  Cardiac RRR, Nl S1, S2, no Murmurs,  Vascular Vessel Right Left  Radial Palpable Palpable  Brachial Palpable Palpable  Ulnar Not palpable Not palpable    Musculo- skeletal  palpable left radial pulse, palpable thrill in distal forearm, no thrill only pulsatile flow beyond aneurysmal segment  Neurologic A&O; CN grossly intact     Non-invasive Vascular Imaging   left Arm Access Duplex Flow volume within normal limits near arterial anastomosis Adherent thrombus and aneurysmal segment which narrows lumen Low flow beyond this area    Medical Decision Making   Thomas [name] is a 78 y.o. male who presents with ESRD requiring hemodialysis.    Continue hemodialysis via right IJ tunneled dialysis catheter  Based on fistula gram pictures from interventional radiology radiocephalic fistula dumps into the basilic vein which all appear to be healthy vein throughout upper arm  This case was discussed with Dr. Trula Slade in office who believes salvaging this fistula would be worthwhile.  Plan will be for revision of left arm AV fistula with possible branch ligation on a nondialysis day in the next week or so  Risks and benefits were discussed with the patient and he agrees to proceed with the above procedure   Dagoberto Ligas PA-C [facility] Office: [phone]  Clinic MD: Dr. Trula Slade

## 2018-11-10 NOTE — H&P (View-Only) (Signed)
Established Dialysis Access   History of Present Illness   Thomas [name] is a 78 y.o. (01-04-1941) male who presents for re-evaluation of left radiocephalic fistula.  This was created by Dr. Bridgett Larsson in 2015.  Over the past several months he reports that the dialysis techs have been on and off pulling clots from fistula.  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  Last week he had a right IJ tunneled dialysis catheter placed as he had not had a full hemodialysis treatment over the past several weeks due to low flow.  Patient would like to salvage this fistula before creating a new dialysis access.  He is not taking any blood thinners.  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    Current Outpatient Medications  Medication Sig Dispense Refill  . acetaminophen (TYLENOL) 650 MG CR tablet Take 1,300 mg by mouth 2 (two) times daily as needed for pain.    Marland Kitchen albuterol (PROVENTIL,VENTOLIN) 2 MG/5ML syrup Take 2 mg by mouth 2 (two) times a day.     . albuterol (VENTOLIN HFA) 108 (90 Base) MCG/ACT inhaler Inhale 1-2 puffs into the lungs every 4 (four) hours as needed for wheezing or shortness of breath.    . ALPRAZolam (XANAX) 1 MG tablet Take 1 mg by mouth at bedtime as needed for sleep.     Marland Kitchen amLODipine (NORVASC) 10 MG tablet Take 10 mg by mouth daily.    Marland Kitchen b complex-vitamin c-folic acid (NEPHRO-VITE) 0.8 MG TABS tablet Take 1 tablet by mouth daily.    . butalbital-acetaminophen-caffeine (FIORICET, ESGIC) 50-325-40 MG per tablet Take 1 tablet by mouth every 6 (six) hours as needed for headache.     . calcium acetate (PHOSLO) 667 MG capsule Take 667-1,334 mg by mouth See admin instructions. TAKE 2 CAPS BY MOUTH 3 TIMES DAILY WITH MEALS. TAKE 1 CAP 2 TIMES DAILY WITH SNACKS    . Calcium Carb-Cholecalciferol (CALCIUM + D3  PO) Take 1 tablet by mouth daily.     . Cholecalciferol (VITAMIN D3) 50 MCG (2000 UT) capsule Take 2 capsules by mouth 2 (two) times daily.     Marland Kitchen dicyclomine (BENTYL) 20 MG tablet Take 20 mg by mouth 4 (four) times daily as needed for spasms.     . diphenoxylate-atropine (LOMOTIL) 2.5-0.025 MG tablet Take 2 tablets by mouth 4 (four) times daily as needed for diarrhea or loose stools.    . gabapentin (NEURONTIN) 300 MG capsule Take 300 mg by mouth 3 (three) times daily as needed (for pain. Pt does take 300 mg every morning).     . hydrochlorothiazide (HYDRODIURIL) 25 MG tablet Take 25 mg by mouth daily.    . insulin lispro (HUMALOG) 100 UNIT/ML injection Inject 10 Units into the skin 3 (three) times daily with meals.     . isosorbide mononitrate (IMDUR) 30 MG 24 hr tablet Take 30 mg by mouth daily.     Marland Kitchen LANTUS SOLOSTAR 100 UNIT/ML injection Inject 45 Units into the skin at bedtime. According to sugar level.    Marland Kitchen losartan (COZAAR) 100 MG tablet Take 100 mg by mouth daily.     . magnesium oxide (MAG-OX) 400 MG tablet Take 400 mg by mouth daily.    . metoprolol (LOPRESSOR) 100 MG tablet Take 100 mg by mouth 2 (two) times daily.     Marland Kitchen  NITROSTAT 0.4 MG SL tablet Place 0.4 mg under the tongue every 5 (five) minutes as needed for chest pain.     . Omega-3 Fatty Acids (FISH OIL) 1000 MG CAPS Take 2,000 mg by mouth 2 (two) times a day.    Marland Kitchen omeprazole (PRILOSEC) 20 MG capsule Take 20 mg by mouth 2 (two) times daily before a meal.    . ondansetron (ZOFRAN) 4 MG tablet Take 4 mg by mouth every 8 (eight) hours as needed for nausea or vomiting.    . primidone (MYSOLINE) 50 MG tablet Take 50 mg by mouth at bedtime.    Marland Kitchen STIOLTO RESPIMAT 2.5-2.5 MCG/ACT AERS Inhale 2 puffs into the lungs at bedtime.    . vitamin B-12 (CYANOCOBALAMIN) 1000 MCG tablet Take 1,000 mcg by mouth daily.     No current facility-administered medications for this visit.     On ROS today: 10 system ROS is negative unless otherwise  noted in HPI   Physical Examination   Vitals:   11/10/18 1333  BP: 138/65  Pulse: 73  Temp: 99.4 F (37.4 C)  TempSrc: Temporal  SpO2: 95%  Weight: 210 lb (95.3 kg)  Height: 5\' 11"  (1.803 m)   Body mass index is 29.29 kg/m.  General Alert, O x 3  Pulmonary Sym exp, good B air movt, CTA B  Cardiac RRR, Nl S1, S2, no Murmurs,  Vascular Vessel Right Left  Radial Palpable Palpable  Brachial Palpable Palpable  Ulnar Not palpable Not palpable    Musculo- skeletal  palpable left radial pulse, palpable thrill in distal forearm, no thrill only pulsatile flow beyond aneurysmal segment  Neurologic A&O; CN grossly intact     Non-invasive Vascular Imaging   left Arm Access Duplex Flow volume within normal limits near arterial anastomosis Adherent thrombus and aneurysmal segment which narrows lumen Low flow beyond this area    Medical Decision Making   Thomas [name] is a 78 y.o. male who presents with ESRD requiring hemodialysis.    Continue hemodialysis via right IJ tunneled dialysis catheter  Based on fistula gram pictures from interventional radiology radiocephalic fistula dumps into the basilic vein which all appear to be healthy vein throughout upper arm  This case was discussed with Dr. Trula Slade in office who believes salvaging this fistula would be worthwhile.  Plan will be for revision of left arm AV fistula with possible branch ligation on a nondialysis day in the next week or so  Risks and benefits were discussed with the patient and he agrees to proceed with the above procedure   Dagoberto Ligas PA-C [facility] Office: [phone]  Clinic MD: Dr. Trula Slade

## 2018-11-25 NOTE — Progress Notes (Signed)
Spoke with Mr Stimmel's daughter. She stated they were told Mr Genova would be screened for Covid 19 DOS due to transportation.

## 2018-11-26 ENCOUNTER — Encounter (HOSPITAL_COMMUNITY): Payer: Self-pay | Admitting: Vascular Surgery

## 2018-11-26 ENCOUNTER — Other Ambulatory Visit: Payer: Self-pay

## 2018-11-26 NOTE — Anesthesia Preprocedure Evaluation (Addendum)
Anesthesia Evaluation  Patient identified by MRN, date of birth, ID band Patient awake    Reviewed: Allergy & Precautions, NPO status , Patient's Chart, lab work & pertinent test results  Airway Mallampati: II  TM Distance: >3 FB Neck ROM: Full    Dental no notable dental hx.    Pulmonary COPD,  oxygen dependent, former smoker,    Pulmonary exam normal breath sounds clear to auscultation       Cardiovascular hypertension, + CAD and + Past MI  Normal cardiovascular exam Rhythm:Regular Rate:Normal     Neuro/Psych negative neurological ROS  negative psych ROS   GI/Hepatic Neg liver ROS, GERD  ,  Endo/Other  diabetes  Renal/GU DialysisRenal disease  negative genitourinary   Musculoskeletal negative musculoskeletal ROS (+)   Abdominal   Peds negative pediatric ROS (+)  Hematology negative hematology ROS (+)   Anesthesia Other Findings   Reproductive/Obstetrics negative OB ROS                            Anesthesia Physical Anesthesia Plan  ASA: IV  Anesthesia Plan: MAC   Post-op Pain Management:    Induction: Intravenous  PONV Risk Score and Plan: 1 and Ondansetron  Airway Management Planned: Simple Face Mask  Additional Equipment:   Intra-op Plan:   Post-operative Plan:   Informed Consent: I have reviewed the patients History and Physical, chart, labs and discussed the procedure including the risks, benefits and alternatives for the proposed anesthesia with the patient or authorized representative who has indicated his/her understanding and acceptance.     Dental advisory given  Plan Discussed with: CRNA and Surgeon  Anesthesia Plan Comments: (PAT note written 11/26/2018 by Myra Gianotti, PA-C. SAME DAY WORK-UP   )       Anesthesia Quick Evaluation

## 2018-11-26 NOTE — Progress Notes (Signed)
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 He has a left radiocephalic AVF. Over the past several months the dialysis techs have had to pull out clots from his.  He had a fistulogram by IR demonstrated adherent thrombus in and aneurysmal segment and low flow beyond this segment. A right IJ TDC placed but also having issues (trouble getting full HD treatment due to low flow). Revision of AVF recommended.   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 Admission [facility] 02/16/18-02/19/18 for sepsis related to UTI/prostatitis, metabolic encephalopathy, SDHs (acute and chronic components; neurosurgery recommended conservative management).   Notes indicate that he is a same day COVID test due to transportation issues. PAT phone RN still attempting to reach patient to update history and give presurgical instructions. Last cardiology visit ~ 2 years ago (11/2016) with medical therapy recommended following cath. He undergoes HD 3x/week, but appears to be having issues even with HD catheter. Further evaluation by his anesthesia team on the day of surgery. If no acute CV/HF symptoms and labs acceptable then I would anticipate he could proceed with revision of his AVF. Case is posted for  MAC.   PROVIDERS: Medicine, [facility] is PCP - Last cardiology visit see was with Thomas Pitter, MD at [facility] on 11/09/16 following 06/2016 LHC with medical therapy recommended. More aggressive volume management with HD recommended. RTC in six months, but I don't see that this has occurred. - He was not felt to be an acceptable candidate for renal transplant (due to age, cardiac history, COPD) per 05/23/15 evaluation at [name] (initial evaluation 05/23/15).   LABS: He is for ISTAT4 or equivalent labs on arrival. As of 11/05/18, H/H 10.3/33.7.    IMAGES: CXR 02/17/18 [facility] CE, during admission for sepsis): Result Impression: 1. Left greater than right bibasilar opacities which likely represent atelectasis. However given leukocytosis, infection consideration. 2. Query mild pulmonary interstitial edema.  CT head 02/18/18 [facility] CE): 1. Similar size of a mixed density right subdural hemorrhage with similar (approximately 6 mm) of right to left midline shift, as detailed above. 2. Status post rhinectomy and partial upper lip resection   EKG: He will need updated EKG on arrival.    CV: LHC 06/08/2016 (as outlined in [facility] CE, see also 11/09/16 office note): Dominance: Right  Left Anterior Descending  Mid LAD-1 lesion, 50% stenosed. The lesion was previously treated using a stent (unknown type).  Mid LAD-2 lesion, 40% stenosed.  Dist LAD lesion, 30% stenosed.  First Diagonal Branch  There is mild diffuse disease throughout the vessel (10-30%).  Left Circumflex  Prox Cx-1 lesion, 30% stenosed.  Prox Cx-2 lesion, 40% stenosed.  Mid Cx-1 lesion, 30% stenosed.  Previously placed Mid Cx-2 stent (unknown type) is widely patent.  Second Obtuse Marginal  Branch  Previously placed 2nd Mrg stent (unknown type) is widely patent.  Right Coronary Artery  Prox RCA lesion, 20% stenosed.  Mid RCA-1 lesion, 40% stenosed.  Mid RCA-2 lesion, 70% stenosed.  Dist RCA lesion, 50% stenosed.  Right Posterior  Descending Artery  RPDA lesion, 30% stenosed.  Addendum by Thomas Moh, MD on 06/08/2016  3:56 PM CP for SOB/volume overload and new wall motion abnormality on  echocardiogram.Moderate non-obstructive CAD in LAD and Cx with patent  stents.mRCA 70%, however, iFR performed since no physiologic/functional  study available and was negative at 0.95.  TEE 05/30/2016 [facility] CE): SUMMARY The left ventricular size is normal. Left ventricular systolic function is low normal. LV ejection fraction = 50-55%. There are regional wall motion abnormalities as specified below. There is mid LV septal wall mild hypokinesis There is apical LV inferior wall mild hypokinesis The right ventricle is normal in size and function. There is mild mitral regurgitation. IVC size was normal. Estimated right atrial pressure is 5 mmHg.Marland Kitchen There is no pericardial effusion. It was unable to compare with the prior study due to different image quality.   Past Medical History:  Diagnosis Date  . Anemia   . Arthritis    hands, back  . Cardiac arrest ([place])   . Cataract   . CHF (congestive heart failure) ([place])   . Chronic kidney disease    CKD, hemodialysis Tues/Thurs/Sat  . COPD (chronic obstructive pulmonary disease) ([place])   . Diabetes mellitus ([place])   . Diverticulosis 2013   tcs by RMR  . Diverticulosis   . Erosive esophagitis   . GERD (gastroesophageal reflux disease)   . Hiatal hernia    pt not aware of this  . HTN (hypertension)   . Hypercholesterolemia   . Hypothyroidism   . Irritable bowel syndrome (IBS)   . Lymphocytic colitis 2013   tcs by RMR  . Myocardial infarction ([place])    X2  . Nasal mass   . Neuropathy   . Pneumonia   . Schatzki's ring   . Shortness of breath   . Sleep apnea    uses O2 only  . Tobacco abuse     Past Surgical History:  Procedure Laterality Date  . AV FISTULA PLACEMENT Left 06/29/2013   Procedure: LEFT RADIAL/CEPHALIC FISTULA;  Surgeon: Thomas [name] , MD;   Location: [place];  Service: Vascular;  Laterality: Left;  . cardiac stents     5  . CATARACT EXTRACTION W/PHACO Left 05/17/2014   Procedure: CATARACT EXTRACTION PHACO AND INTRAOCULAR LENS PLACEMENT (IOC);  Surgeon: Tonny Branch, MD;  Location: [facility];  Service: Ophthalmology;  Laterality: Left;  CDE:7.71  . CATARACT EXTRACTION W/PHACO Right 06/14/2014   Procedure: CATARACT EXTRACTION PHACO AND INTRAOCULAR LENS PLACEMENT RIGHT EYE;  Surgeon: Tonny Branch, MD;  Location: [facility];  Service: Ophthalmology;  Laterality: Right;  CDE:8.22  . CHOLECYSTECTOMY    . COLONOSCOPY  December 2007   adenomatous polyps, sigmoid diverticula  . COLONOSCOPY  02/06/2012   Dr. Gala Romney- colonic diverticulosis, bx consistent with lymphocytic colitis  . CORONARY ANGIOPLASTY     x5 stents  . ESOPHAGOGASTRODUODENOSCOPY  May 2009   RMR: non-critical Schatzki's ring, sp dilation with 80 F [place], small inlet patch, mild erosive relufx esophagitis  . ESOPHAGOGASTRODUODENOSCOPY N/A 07/15/2012   Dr. Gala Romney- hiatal hernia, duodenal ulcer with surrounding erosions. this may well be related to the "haziness" sen around the head of the pancreas and duodenum on recent noncontrast ct scan and may have  much to do with his abdominal pain.  Marland Kitchen EXCISION NASAL MASS N/A 07/18/2015   Procedure: EXCISION OF NASAL MASS;  Surgeon: Leta Baptist, MD;  Location: [place];  Service: ENT;  Laterality: N/A;  . INSERTION OF DIALYSIS CATHETER Right 05/21/2013   Procedure: INSERTION OF DIALYSIS CATHETER;  Surgeon: Thomas [name] [place], MD;  Location: [place];  Service: Vascular;  Laterality: Right;  . IR DIALY SHUNT INTRO [place] W/IMG LEFT Left 10/29/2018  . IR FLUORO GUIDE CV LINE RIGHT  11/05/2018  . IR US GUIDE VASC ACCESS LEFT  10/29/2018  . IR US GUIDE VASC ACCESS RIGHT  11/05/2018  . VASECTOMY      MEDICATIONS: No current facility-administered medications for this encounter.    Marland Kitchen acetaminophen (TYLENOL) 650 MG CR tablet  . albuterol (PROVENTIL,VENTOLIN)  2 MG/5ML syrup  . albuterol (VENTOLIN HFA) 108 (90 Base) MCG/ACT inhaler  . ALPRAZolam (XANAX) 1 MG tablet  . amLODipine (NORVASC) 10 MG tablet  . b complex-vitamin c-folic acid (NEPHRO-VITE) 0.8 MG TABS tablet  . butalbital-acetaminophen-caffeine (FIORICET, ESGIC) 50-325-40 MG per tablet  . calcium acetate (PHOSLO) 667 MG capsule  . Calcium Carb-Cholecalciferol (CALCIUM + D3 PO)  . Cholecalciferol (VITAMIN D3) 50 MCG (2000 UT) capsule  . dicyclomine (BENTYL) 20 MG tablet  . diphenhydrAMINE (BENADRYL) 25 MG tablet  . gabapentin (NEURONTIN) 300 MG capsule  . hydrochlorothiazide (HYDRODIURIL) 25 MG tablet  . insulin lispro (HUMALOG) 100 UNIT/ML injection  . isosorbide mononitrate (IMDUR) 30 MG 24 hr tablet  . LANTUS SOLOSTAR 100 UNIT/ML injection  . lidocaine-prilocaine (EMLA) cream  . loperamide (IMODIUM) 1 MG/5ML solution  . losartan (COZAAR) 100 MG tablet  . magnesium oxide (MAG-OX) 400 MG tablet  . metoprolol (LOPRESSOR) 100 MG tablet  . Naphazoline-Glycerin (REDNESS RELIEF OP)  . NITROSTAT 0.4 MG SL tablet  . Omega-3 Fatty Acids (FISH OIL) 1000 MG CAPS  . omeprazole (PRILOSEC) 20 MG capsule  . ondansetron (ZOFRAN) 4 MG tablet  . primidone (MYSOLINE) 50 MG tablet  . STIOLTO RESPIMAT 2.5-2.5 MCG/ACT AERS    Myra Gianotti, PA-C Surgical Short Stay/Anesthesiology [facility] Phone [phone] [facility] Phone [phone] 11/26/2018 4:18 PM

## 2018-11-26 NOTE — Progress Notes (Signed)
SDW-pre-op call completed by pt daughter, Jonelle Sidle. Daughter denies that pt C/O any acute cardiopulmonary issues. Daughter stated that pt is under the care of a cardiologist at [facility] " I can't remember their name." Daughter denies that pt had a stress test. Daughter denies that pt had an EKG within the last year. Daughter made aware to have the pt stop taking vitamins, fish oil and herbal medications. Do not take any NSAIDs ie: Ibuprofen, Advil, Naproxen (Aleve), Motrin, BC and Goody Powder. Daughter made aware to have pt take 22-32 units of Lantus insulin tonight and no insulin on DOS. Daughter made aware to have pt check BG every 2 hours prior to arrival to hospital on DOS. Daughter made aware to treat a BG < 70 with 4 glucose tabs or  4 ounces of apple  juice, wait 15 minutes after intervention to recheck BG, if BG remains < 70, call Short Stay unit to speak with a nurse.  Daughter denies that pt and family tested positive for COVID-19.  Daughter denies that pt and family members experienced the following symptoms:  Cough yes/no: No Fever (>100.10F)  yes/no: No Runny nose yes/no: No Sore throat yes/no: No Difficulty breathing/shortness of breath  yes/no: No  Have you or a family member traveled in the last 14 days and where? yes/no: No  Daughter reminded that hospital visitation restrictions are in effect and the importance of the restrictions.   Daughter verbalized understanding of all pre-op instructions.  PA, Anesthesiology, asked to review pt history; see note.

## 2018-11-27 ENCOUNTER — Ambulatory Visit (HOSPITAL_COMMUNITY)
Admission: RE | Admit: 2018-11-27 | Discharge: 2018-11-27 | Disposition: A | Discharge status: Home / Self Care | Payer: Medicare Other | Attending: Surgery | Admitting: Surgery

## 2018-11-27 ENCOUNTER — Other Ambulatory Visit: Payer: Self-pay

## 2018-11-27 ENCOUNTER — Encounter (HOSPITAL_COMMUNITY): Payer: Self-pay

## 2018-11-27 ENCOUNTER — Encounter (HOSPITAL_COMMUNITY)
Admission: RE | Disposition: A | Discharge status: Home / Self Care | Payer: Self-pay | Source: Home / Self Care | Attending: Surgery

## 2018-11-27 ENCOUNTER — Ambulatory Visit (HOSPITAL_COMMUNITY): Payer: Medicare Other | Admitting: Vascular Surgery

## 2018-11-27 DIAGNOSIS — E7800 Pure hypercholesterolemia, unspecified: Secondary | ICD-10-CM | POA: Diagnosis not present

## 2018-11-27 DIAGNOSIS — K219 Gastro-esophageal reflux disease without esophagitis: Secondary | ICD-10-CM | POA: Diagnosis not present

## 2018-11-27 DIAGNOSIS — I252 Old myocardial infarction: Secondary | ICD-10-CM | POA: Insufficient documentation

## 2018-11-27 DIAGNOSIS — Z9842 Cataract extraction status, left eye: Secondary | ICD-10-CM | POA: Diagnosis not present

## 2018-11-27 DIAGNOSIS — M19042 Primary osteoarthritis, left hand: Secondary | ICD-10-CM | POA: Diagnosis not present

## 2018-11-27 DIAGNOSIS — K222 Esophageal obstruction: Secondary | ICD-10-CM | POA: Insufficient documentation

## 2018-11-27 DIAGNOSIS — K589 Irritable bowel syndrome without diarrhea: Secondary | ICD-10-CM | POA: Diagnosis not present

## 2018-11-27 DIAGNOSIS — Z79899 Other long term (current) drug therapy: Secondary | ICD-10-CM | POA: Insufficient documentation

## 2018-11-27 DIAGNOSIS — Z888 Allergy status to other drugs, medicaments and biological substances status: Secondary | ICD-10-CM | POA: Insufficient documentation

## 2018-11-27 DIAGNOSIS — X58XXXA Exposure to other specified factors, initial encounter: Secondary | ICD-10-CM | POA: Diagnosis not present

## 2018-11-27 DIAGNOSIS — I132 Hypertensive heart and chronic kidney disease with heart failure and with stage 5 chronic kidney disease, or end stage renal disease: Secondary | ICD-10-CM | POA: Diagnosis not present

## 2018-11-27 DIAGNOSIS — E1122 Type 2 diabetes mellitus with diabetic chronic kidney disease: Secondary | ICD-10-CM | POA: Insufficient documentation

## 2018-11-27 DIAGNOSIS — Z833 Family history of diabetes mellitus: Secondary | ICD-10-CM | POA: Insufficient documentation

## 2018-11-27 DIAGNOSIS — K449 Diaphragmatic hernia without obstruction or gangrene: Secondary | ICD-10-CM | POA: Insufficient documentation

## 2018-11-27 DIAGNOSIS — Z9049 Acquired absence of other specified parts of digestive tract: Secondary | ICD-10-CM | POA: Insufficient documentation

## 2018-11-27 DIAGNOSIS — N186 End stage renal disease: Secondary | ICD-10-CM | POA: Diagnosis not present

## 2018-11-27 DIAGNOSIS — Z1159 Encounter for screening for other viral diseases: Secondary | ICD-10-CM | POA: Insufficient documentation

## 2018-11-27 DIAGNOSIS — M19041 Primary osteoarthritis, right hand: Secondary | ICD-10-CM | POA: Insufficient documentation

## 2018-11-27 DIAGNOSIS — E1140 Type 2 diabetes mellitus with diabetic neuropathy, unspecified: Secondary | ICD-10-CM | POA: Diagnosis not present

## 2018-11-27 DIAGNOSIS — T82898A Other specified complication of vascular prosthetic devices, implants and grafts, initial encounter: Secondary | ICD-10-CM | POA: Insufficient documentation

## 2018-11-27 DIAGNOSIS — M1990 Unspecified osteoarthritis, unspecified site: Secondary | ICD-10-CM | POA: Diagnosis not present

## 2018-11-27 DIAGNOSIS — Z9841 Cataract extraction status, right eye: Secondary | ICD-10-CM | POA: Insufficient documentation

## 2018-11-27 DIAGNOSIS — M4690 Unspecified inflammatory spondylopathy, site unspecified: Secondary | ICD-10-CM | POA: Insufficient documentation

## 2018-11-27 DIAGNOSIS — J449 Chronic obstructive pulmonary disease, unspecified: Secondary | ICD-10-CM | POA: Insufficient documentation

## 2018-11-27 DIAGNOSIS — K5790 Diverticulosis of intestine, part unspecified, without perforation or abscess without bleeding: Secondary | ICD-10-CM | POA: Diagnosis not present

## 2018-11-27 DIAGNOSIS — Z7951 Long term (current) use of inhaled steroids: Secondary | ICD-10-CM | POA: Insufficient documentation

## 2018-11-27 DIAGNOSIS — I2510 Atherosclerotic heart disease of native coronary artery without angina pectoris: Secondary | ICD-10-CM | POA: Insufficient documentation

## 2018-11-27 DIAGNOSIS — G4730 Sleep apnea, unspecified: Secondary | ICD-10-CM | POA: Diagnosis not present

## 2018-11-27 DIAGNOSIS — Z87891 Personal history of nicotine dependence: Secondary | ICD-10-CM | POA: Insufficient documentation

## 2018-11-27 DIAGNOSIS — I509 Heart failure, unspecified: Secondary | ICD-10-CM | POA: Insufficient documentation

## 2018-11-27 DIAGNOSIS — Z809 Family history of malignant neoplasm, unspecified: Secondary | ICD-10-CM | POA: Insufficient documentation

## 2018-11-27 DIAGNOSIS — Z992 Dependence on renal dialysis: Secondary | ICD-10-CM | POA: Insufficient documentation

## 2018-11-27 DIAGNOSIS — T82868A Thrombosis of vascular prosthetic devices, implants and grafts, initial encounter: Secondary | ICD-10-CM | POA: Diagnosis not present

## 2018-11-27 DIAGNOSIS — Z955 Presence of coronary angioplasty implant and graft: Secondary | ICD-10-CM | POA: Insufficient documentation

## 2018-11-27 DIAGNOSIS — E039 Hypothyroidism, unspecified: Secondary | ICD-10-CM | POA: Insufficient documentation

## 2018-11-27 DIAGNOSIS — Z794 Long term (current) use of insulin: Secondary | ICD-10-CM | POA: Insufficient documentation

## 2018-11-27 DIAGNOSIS — Z885 Allergy status to narcotic agent status: Secondary | ICD-10-CM | POA: Insufficient documentation

## 2018-11-27 HISTORY — DX: Atherosclerotic heart disease of native coronary artery without angina pectoris: I25.10

## 2018-11-27 HISTORY — PX: THROMBECTOMY W/ EMBOLECTOMY: SHX2507

## 2018-11-27 HISTORY — DX: Traumatic subdural hemorrhage with loss of consciousness status unknown, initial encounter: S06.5XAA

## 2018-11-27 HISTORY — PX: REVISION OF ARTERIOVENOUS GORETEX GRAFT: SHX6073

## 2018-11-27 HISTORY — DX: Malignant (primary) neoplasm, unspecified: C80.1

## 2018-11-27 HISTORY — DX: Presence of spectacles and contact lenses: Z97.3

## 2018-11-27 HISTORY — DX: Dependence on supplemental oxygen: Z99.81

## 2018-11-27 HISTORY — DX: Presence of dental prosthetic device (complete) (partial): Z97.2

## 2018-11-27 HISTORY — DX: Headache, unspecified: R51.9

## 2018-11-27 HISTORY — DX: Traumatic subdural hemorrhage with loss of consciousness of unspecified duration, initial encounter: S06.5X9A

## 2018-11-27 HISTORY — PX: LIGATION OF COMPETING BRANCHES OF ARTERIOVENOUS FISTULA: SHX5949

## 2018-11-27 LAB — GLUCOSE, CAPILLARY
Glucose-Capillary: 201 mg/dL — ABNORMAL HIGH (ref 70–99)
Glucose-Capillary: 161 mg/dL — ABNORMAL HIGH (ref 70–99)

## 2018-11-27 LAB — POCT I-STAT 4, (NA,K, GLUC, HGB,HCT)
Glucose, Bld: 203 mg/dL — ABNORMAL HIGH (ref 70–99)
HCT: 34 % — ABNORMAL LOW (ref 39.0–52.0)
Hemoglobin: 11.6 g/dL — ABNORMAL LOW (ref 13.0–17.0)
Potassium: 3.1 mmol/L — ABNORMAL LOW (ref 3.5–5.1)
Sodium: 134 mmol/L — ABNORMAL LOW (ref 135–145)

## 2018-11-27 LAB — SARS CORONAVIRUS 2 BY RT PCR (HOSPITAL ORDER, PERFORMED IN ~~LOC~~ HOSPITAL LAB): SARS Coronavirus 2: NEGATIVE

## 2018-11-27 SURGERY — REVISION OF ARTERIOVENOUS GORETEX GRAFT
Anesthesia: Monitor Anesthesia Care | Site: Arm Lower | Laterality: Left

## 2018-11-27 MED ORDER — PROPOFOL 10 MG/ML IV BOLUS
INTRAVENOUS | Status: DC | PRN
  Administered 2018-11-27: 20 mg via INTRAVENOUS
  Administered 2018-11-27: 15 mg via INTRAVENOUS

## 2018-11-27 MED ORDER — LIDOCAINE-EPINEPHRINE (PF) 1 %-1:200000 IJ SOLN
INTRAMUSCULAR | Status: DC | PRN
  Administered 2018-11-27: 17 mL

## 2018-11-27 MED ORDER — CHLORHEXIDINE GLUCONATE 4 % EX LIQD: 60.0000 mL | Freq: Once | CUTANEOUS | Status: DC

## 2018-11-27 MED ORDER — SODIUM CHLORIDE 0.9 % IV SOLN
INTRAVENOUS | Status: AC
  Filled 2018-11-27: qty 1.2

## 2018-11-27 MED ORDER — SODIUM CHLORIDE 0.9 % IR SOLN
Status: DC | PRN
  Administered 2018-11-27: 1000 mL

## 2018-11-27 MED ORDER — SODIUM CHLORIDE 0.9 % IV SOLN
INTRAVENOUS | Status: DC
  Administered 2018-11-27: 07:00:00 via INTRAVENOUS

## 2018-11-27 MED ORDER — LIDOCAINE-EPINEPHRINE (PF) 1 %-1:200000 IJ SOLN
INTRAMUSCULAR | Status: AC
  Filled 2018-11-27: qty 30

## 2018-11-27 MED ORDER — FENTANYL CITRATE (PF) 100 MCG/2ML IJ SOLN
INTRAMUSCULAR | Status: DC | PRN
  Administered 2018-11-27: 50 ug via INTRAVENOUS
  Administered 2018-11-27 (×3): 25 ug via INTRAVENOUS

## 2018-11-27 MED ORDER — ONDANSETRON HCL 4 MG/2ML IJ SOLN
INTRAMUSCULAR | Status: DC | PRN
  Administered 2018-11-27: 4 mg via INTRAVENOUS

## 2018-11-27 MED ORDER — SODIUM CHLORIDE 0.9 % IV SOLN
INTRAVENOUS | Status: DC | PRN
  Administered 2018-11-27: 30 ug/min via INTRAVENOUS

## 2018-11-27 MED ORDER — CEFAZOLIN SODIUM-DEXTROSE 2-4 GM/100ML-% IV SOLN
2.0000 g | INTRAVENOUS | Status: AC
  Administered 2018-11-27: 2 g via INTRAVENOUS
  Filled 2018-11-27: qty 100

## 2018-11-27 MED ORDER — PROPOFOL 500 MG/50ML IV EMUL
INTRAVENOUS | Status: DC | PRN
  Administered 2018-11-27: 100 ug/kg/min via INTRAVENOUS

## 2018-11-27 MED ORDER — METOPROLOL TARTRATE 100 MG PO TABS
100.0000 mg | ORAL_TABLET | Freq: Two times a day (BID) | ORAL | Status: DC
  Administered 2018-11-27: 100 mg via ORAL
  Filled 2018-11-27: qty 1

## 2018-11-27 MED ORDER — SODIUM CHLORIDE 0.9 % IV SOLN
INTRAVENOUS | Status: DC | PRN
  Administered 2018-11-27: 500 mL

## 2018-11-27 MED ORDER — FENTANYL CITRATE (PF) 250 MCG/5ML IJ SOLN
INTRAMUSCULAR | Status: AC
  Filled 2018-11-27: qty 5

## 2018-11-27 MED ORDER — METOPROLOL TARTRATE 50 MG PO TABS
ORAL_TABLET | ORAL | Status: AC
  Administered 2018-11-27: 100 mg via ORAL
  Filled 2018-11-27: qty 2

## 2018-11-27 MED ORDER — HYDROCODONE-ACETAMINOPHEN 5-325 MG PO TABS: 1.0000 | ORAL_TABLET | Freq: Four times a day (QID) | ORAL | 0 refills | Status: DC | PRN

## 2018-11-27 SURGICAL SUPPLY — 45 items
ADH SKN CLS APL DERMABOND .7 (GAUZE/BANDAGES/DRESSINGS) ×1
CANISTER SUCT 3000ML PPV (MISCELLANEOUS) ×2 IMPLANT
CATH EMB 3FR 40CM (CATHETERS) ×1 IMPLANT
CATH EMB 4FR 80CM (CATHETERS) ×1 IMPLANT
CLIP VESOCCLUDE MED 6/CT (CLIP) ×2 IMPLANT
CLIP VESOCCLUDE SM WIDE 6/CT (CLIP) ×3 IMPLANT
COVER PROBE W GEL 5X96 (DRAPES) ×1 IMPLANT
COVER WAND RF STERILE (DRAPES) ×1 IMPLANT
DERMABOND ADVANCED (GAUZE/BANDAGES/DRESSINGS) ×1
DERMABOND ADVANCED .7 DNX12 (GAUZE/BANDAGES/DRESSINGS) ×1 IMPLANT
ELECT REM PT RETURN 9FT ADLT (ELECTROSURGICAL) ×2
ELECTRODE REM PT RTRN 9FT ADLT (ELECTROSURGICAL) ×1 IMPLANT
GLOVE BIO SURGEON STRL SZ7.5 (GLOVE) ×2 IMPLANT
GLOVE BIOGEL PI IND STRL 6.5 (GLOVE) IMPLANT
GLOVE BIOGEL PI IND STRL 7.5 (GLOVE) ×1 IMPLANT
GLOVE BIOGEL PI IND STRL 8 (GLOVE) IMPLANT
GLOVE BIOGEL PI INDICATOR 6.5 (GLOVE) ×2
GLOVE BIOGEL PI INDICATOR 7.5 (GLOVE) ×1
GLOVE BIOGEL PI INDICATOR 8 (GLOVE) ×2
GLOVE SURG SS PI 7.5 STRL IVOR (GLOVE) ×2 IMPLANT
GOWN STRL REUS W/ TWL LRG LVL3 (GOWN DISPOSABLE) ×2 IMPLANT
GOWN STRL REUS W/ TWL XL LVL3 (GOWN DISPOSABLE) ×1 IMPLANT
GOWN STRL REUS W/TWL 2XL LVL3 (GOWN DISPOSABLE) ×3 IMPLANT
GOWN STRL REUS W/TWL LRG LVL3 (GOWN DISPOSABLE) ×2
GOWN STRL REUS W/TWL XL LVL3 (GOWN DISPOSABLE)
HEMOSTAT SNOW SURGICEL 2X4 (HEMOSTASIS) IMPLANT
KIT BASIN OR (CUSTOM PROCEDURE TRAY) ×2 IMPLANT
KIT TURNOVER KIT B (KITS) ×2 IMPLANT
NDL HYPO 25GX1X1/2 BEV (NEEDLE) ×1 IMPLANT
NEEDLE HYPO 25GX1X1/2 BEV (NEEDLE) ×2 IMPLANT
NS IRRIG 1000ML POUR BTL (IV SOLUTION) ×2 IMPLANT
PACK CV ACCESS (CUSTOM PROCEDURE TRAY) ×2 IMPLANT
PAD ARMBOARD 7.5X6 YLW CONV (MISCELLANEOUS) ×4 IMPLANT
SPONGE INTESTINAL PEANUT (DISPOSABLE) ×1 IMPLANT
SPONGE LAP 18X18 RF (DISPOSABLE) ×1 IMPLANT
SUT PROLENE 5 0 C 1 24 (SUTURE) ×1 IMPLANT
SUT PROLENE 5 0 C 1 36 (SUTURE) ×1 IMPLANT
SUT PROLENE 6 0 BV (SUTURE) ×4 IMPLANT
SUT VIC AB 3-0 SH 27 (SUTURE) ×4
SUT VIC AB 3-0 SH 27X BRD (SUTURE) ×2 IMPLANT
SUT VICRYL 4-0 PS2 18IN ABS (SUTURE) ×2 IMPLANT
SYR TB 1ML LUER SLIP (SYRINGE) ×1 IMPLANT
TOWEL GREEN STERILE (TOWEL DISPOSABLE) ×2 IMPLANT
UNDERPAD 30X30 (UNDERPADS AND DIAPERS) ×2 IMPLANT
WATER STERILE IRR 1000ML POUR (IV SOLUTION) ×2 IMPLANT

## 2018-11-27 NOTE — Anesthesia Procedure Notes (Signed)
Procedure Name: MAC Date/Time: 11/27/2018 7:33 AM Performed by: Elayne Snare, CRNA Pre-anesthesia Checklist: Patient identified, Emergency Drugs available, Suction available and Patient being monitored Patient Re-evaluated:Patient Re-evaluated prior to induction Oxygen Delivery Method: Simple face mask

## 2018-11-27 NOTE — Discharge Instructions (Signed)
° °  [facility] [place] ° °Discharge Instructions ° °AV Fistula or Graft Surgery for Dialysis Access ° °Please refer to the following instructions for your post-procedure care. Your surgeon or physician assistant will discuss any changes with you. ° °Activity ° °You may drive the day following your surgery, if you are comfortable and no longer taking prescription pain medication. Resume full activity as the soreness in your incision resolves. ° °Bathing/Showering ° °You may shower after you go home. Keep your incision dry for 48 hours. Do not soak in a bathtub, hot tub, or swim until the incision heals completely. You may not shower if you have a hemodialysis catheter. ° °Incision Care ° °Clean your incision with mild soap and water after 48 hours. Pat the area dry with a clean towel. You do not need a bandage unless otherwise instructed. Do not apply any ointments or creams to your incision. You may have skin glue on your incision. Do not peel it off. It will come off on its own in about one week. Your arm may swell a bit after surgery. To reduce swelling use pillows to elevate your arm so it is above your heart. Your doctor will tell you if you need to lightly wrap your arm with an ACE bandage. ° °Diet ° °Resume your normal diet. There are not special food restrictions following this procedure. In order to heal from your surgery, it is CRITICAL to get adequate nutrition. Your body requires vitamins, minerals, and protein. Vegetables are the best source of vitamins and minerals. Vegetables also provide the perfect balance of protein. Processed food has little nutritional value, so try to avoid this. ° °Medications ° °Resume taking all of your medications. If your incision is causing pain, you may take over-the counter pain relievers such as acetaminophen (Tylenol). If you were prescribed a stronger pain medication, please be aware these medications can cause nausea and constipation. Prevent  nausea by taking the medication with a snack or meal. Avoid constipation by drinking plenty of fluids and eating foods with high amount of fiber, such as fruits, vegetables, and grains. Do not take Tylenol if you are taking prescription pain medications. ° ° ° ° °Follow up °Your surgeon may want to see you in the office following your access surgery. If so, this will be arranged at the time of your surgery. ° °Please call us immediately for any of the following conditions: ° °Increased pain, redness, drainage (pus) from your incision site °Fever of 101 degrees or higher °Severe or worsening pain at your incision site °Hand pain or numbness. ° °Reduce your risk of vascular disease: ° °Stop smoking. If you would like help, call [facility] at 1-800-QUIT-NOW (1-800-784-8669) or [place] at 336-586-4000 ° °Manage your cholesterol °Maintain a desired weight °Control your diabetes °Keep your blood pressure down ° °Dialysis ° °It will take several weeks to several months for your new dialysis access to be ready for use. Your surgeon will determine when it is OK to use it. Your nephrologist will continue to direct your dialysis. You can continue to use your Permcath until your new access is ready for use. ° °If you have any questions, please call the office at [phone]. ° °

## 2018-11-27 NOTE — Op Note (Signed)
° ° °  Patient name: Thomas [name] MRN: [id number] DOB: [birth year] Sex: male  11/27/2018 Pre-operative Diagnosis: ESRD Post-operative diagnosis:  Same Surgeon:  Annamarie Major Assistants:  Arlee Muslim Procedure:   #1: Branch ligation x2 of left radiocephalic fistula   #2: Open thrombectomy of left radiocephalic fistula Anesthesia: MAC Blood Loss: 50 cc  Specimens: None  Findings: A bifurcated venous branch was identified near the anastomosis which was ligated.  The aneurysmal portion of the fistula was exposed and opened.  There was adherent thrombus which was completely evacuated.  I did not plicate the aneurysm as I felt that that would narrow it too much.  There was a good thrill after the procedure.  Indications: The patient has had difficulty with cannulation of his fistula and now has a catheter in place.  He comes in today for revision  Procedure:  The patient was identified in the holding area and taken to [place] [facility]  The patient was then placed supine on the table. MAC anesthesia was administered.  The patient was prepped and draped in the usual sterile fashion.  A time out was called and antibiotics were administered.  Ultrasound was used to evaluate the fistula.  Identified a large branch near the arterial venous anastomosis.  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  1% lidocaine was then used for local anesthesia.  I made an elliptical incision in the mid forearm over top of the aneurysmal segment.  I then used cautery to dissect out the aneurysmal area which was approximately 2 cm in diameter.  Before opening the fistula, I made an incision over top of the branch.  Once isolated the appropriate branch x2, they were ligated between silk ties.  Next, the aneurysmal section of the fistula was occluded proximally and distally.  #11 blade was used to open the aneurysmal area.  There was a  significant amount of very adherent thrombus to the intima of the vein.  Using a Kitner and an elevator, I was able to remove all of the adherent thrombus.  Once this was done I passed a Fogarty catheter proximally and distally and no thrombus was evacuated.  There was good inflow to the fistula.  I did not feel that plication should be performed because I felt it would narrow the fistula too much.  I then closed the longitudinal venotomy with running 5-0 Prolene.  Clamps were then released and there was a good thrill within the fistula.  Is somewhat hard to feel over top of the aneurysmal segment but easily palpable proximally and distally to this area.  Next the wound was irrigated.  Hemostasis was achieved.  The incisions were closed with 2 layers of 3-0 Vicryl followed by Dermabond.  There were no immediate complications.   Disposition: To PACU stable.   Theotis Burrow, M.D., [facility] [place] Office: [phone] Pager:  [phone]

## 2018-11-27 NOTE — Anesthesia Postprocedure Evaluation (Signed)
Anesthesia Post Note  Patient: Thomas [name]  Procedure(s) Performed: Revision of Left Arm Arteriovenious Fistula (Left Arm Lower) Thrombectomy Arteriovenous Fistula (Left Arm Lower) Ligation Of Competing Branches Of Arteriovenous Fistula (Left Arm Lower)     Patient location during evaluation: PACU Anesthesia Type: MAC Level of consciousness: awake and alert Pain management: pain level controlled Vital Signs Assessment: post-procedure vital signs reviewed and stable Respiratory status: spontaneous breathing, nonlabored ventilation, respiratory function stable and patient connected to nasal cannula oxygen Cardiovascular status: stable and blood pressure returned to baseline Postop Assessment: no apparent nausea or vomiting Anesthetic complications: no    Last Vitals:  Vitals:   11/27/18 0930 11/27/18 0944  BP: (!) 113/55 (!) 115/53  Pulse: 67 64  Resp: 16 14  Temp: (!) 36.1 C   SpO2: 93% 92%    Last Pain:  Vitals:   11/27/18 0930  TempSrc:   PainSc: 0-No pain                 Louella Medaglia S

## 2018-11-27 NOTE — Transfer of Care (Signed)
Immediate Anesthesia Transfer of Care Note  Patient: Thomas [name]  Procedure(s) Performed: Revision of Left Arm Arteriovenious Fistula (Left Arm Lower) Thrombectomy Arteriovenous Fistula (Left Arm Lower) Ligation Of Competing Branches Of Arteriovenous Fistula (Left Arm Lower)  Patient Location: PACU  Anesthesia Type:MAC  Level of Consciousness: awake, alert  and patient cooperative  Airway & Oxygen Therapy: Patient Spontanous Breathing  Post-op Assessment: Report given to RN and Post -op Vital signs reviewed and stable  Post vital signs: Reviewed and stable  Last Vitals:  Vitals Value Taken Time  BP 113/55 11/27/18 0930  Temp    Pulse 65 11/27/18 0931  Resp 16 11/27/18 0931  SpO2 92 % 11/27/18 0931  Vitals shown include unvalidated device data.  Last Pain:  Vitals:   11/27/18 0549  TempSrc: Oral  PainSc: 0-No pain      Patients Stated Pain Goal: 0 (63/84/66 5993)  Complications: No apparent anesthesia complications

## 2018-11-27 NOTE — Interval H&P Note (Signed)
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  The various methods of treatment have been discussed with the patient and family. After consideration of risks, benefits and other options for treatment, the patient has consented to  Procedure(s): REVISION LEFT ARM  ARTERIOVENIOUS FISTULA POSSIBLE LIGATION OF SIDE BRANCHES (Left) as a surgical intervention.  The patient's history has been reviewed, patient examined, no change in status, stable for surgery.  I have reviewed the patient's chart and labs.  Questions were answered to the patient's satisfaction.     Annamarie Major

## 2018-11-28 ENCOUNTER — Encounter (HOSPITAL_COMMUNITY): Payer: Self-pay | Admitting: Surgery

## 2018-12-22 ENCOUNTER — Encounter: Payer: Self-pay | Admitting: Family

## 2018-12-22 ENCOUNTER — Ambulatory Visit (INDEPENDENT_AMBULATORY_CARE_PROVIDER_SITE_OTHER): Payer: Medicare Other | Admitting: Family

## 2018-12-22 ENCOUNTER — Other Ambulatory Visit: Payer: Self-pay

## 2018-12-22 VITALS — BP 117/61 | HR 73 | Temp 98.1°F | Resp 18 | Ht 71.0 in | Wt 213.2 lb

## 2018-12-22 DIAGNOSIS — I770 Arteriovenous fistula, acquired: Secondary | ICD-10-CM

## 2018-12-22 DIAGNOSIS — T148XXA Other injury of unspecified body region, initial encounter: Secondary | ICD-10-CM

## 2018-12-22 DIAGNOSIS — N186 End stage renal disease: Secondary | ICD-10-CM

## 2018-12-22 DIAGNOSIS — Z992 Dependence on renal dialysis: Secondary | ICD-10-CM

## 2018-12-22 NOTE — Progress Notes (Signed)
CC: Follow up branch ligation and thrombectomy of AVF  History of Present Illness  Thomas [name] is a 78 y.o. ([birth year]) male who is s/p branch ligation x2 of left radiocephalic fistula, and open thrombectomy of left radiocephalic fistula on 02-04-39 by Dr. 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   He returns today for post op follow up.  He denies any steal type symptoms in his left forearm or hand, He denies fever or chills.   He dialyzes MWF via right IJ TDC.   He has used this left forearm AVF since 2016.   He is right hand dominant.    Past Medical History:  Diagnosis Date  . Anemia   . Arthritis    hands, back  . Cancer (HCC)    nose  . Cardiac arrest ([place])   . Cataract   . CHF (congestive heart failure) ([place])   . Chronic kidney disease    CKD, hemodialysis Tues/Thurs/Sat  . COPD (chronic obstructive pulmonary disease) ([place])   . Coronary artery disease   . Diabetes mellitus ([place])   . Diverticulosis 2013   tcs by RMR  . Diverticulosis   . Erosive esophagitis   . GERD (gastroesophageal reflux disease)   . Headache   . Hiatal hernia    pt not aware of this  . HTN (hypertension)   . Hypercholesterolemia   . Hypothyroidism   . Irritable bowel syndrome (IBS)   . Lymphocytic colitis 2013   tcs by RMR  . Myocardial infarction ([place])    X2  . Nasal mass   . Neuropathy   . Pneumonia   . Schatzki's ring   . SDH (subdural hematoma) ([place])    02/2018, conservative management  . Shortness of breath   . Sleep apnea    uses O2 only  . Supplemental oxygen dependent    At HS and PRN during the day  . Tobacco abuse   . Wears dentures   . Wears glasses     Social History Social History   Tobacco Use  . Smoking status: Former Smoker    Packs/day: 0.00    Years: 60.00    Pack years: 0.00    Types: E-cigarettes, Cigarettes    Quit date: 11/10/2014    Years since quitting: 4.1   . Smokeless tobacco: Never Used  Substance Use Topics  . Alcohol use: No  . Drug use: No    Family History Family History  Problem Relation Age of Onset  . Cancer Mother   . Cancer Father   . Diabetes Brother   . Cancer Brother   . Diabetes Sister   . Cancer Sister   . Cancer Sister   . Colon cancer Neg Hx     Surgical History Past Surgical History:  Procedure Laterality Date  . AV FISTULA PLACEMENT Left 06/29/2013   Procedure: LEFT RADIAL/CEPHALIC FISTULA;  Surgeon: Conrad Oak Hill, MD;  Location: [place];  Service: Vascular;  Laterality: Left;  . cardiac stents     5  . CATARACT EXTRACTION W/PHACO Left 05/17/2014   Procedure: CATARACT EXTRACTION PHACO AND INTRAOCULAR LENS PLACEMENT (IOC);  Surgeon: Tonny Branch, MD;  Location: [facility];  Service: Ophthalmology;  Laterality: Left;  CDE:7.71  . CATARACT EXTRACTION W/PHACO Right 06/14/2014   Procedure: CATARACT EXTRACTION PHACO AND INTRAOCULAR LENS PLACEMENT RIGHT EYE;  Surgeon: Tonny Branch, MD;  Location: [facility];  Service: Ophthalmology;  Laterality: Right;  CDE:8.22  . CHOLECYSTECTOMY    . COLONOSCOPY  December 2007   adenomatous polyps, sigmoid diverticula  . COLONOSCOPY  02/06/2012   Dr. Gala Romney- colonic diverticulosis, bx consistent with lymphocytic colitis  . CORONARY ANGIOPLASTY     x5 stents  . ESOPHAGOGASTRODUODENOSCOPY  May 2009   RMR: non-critical Schatzki's ring, sp dilation with 79 F [address], small inlet patch, mild erosive relufx esophagitis  . ESOPHAGOGASTRODUODENOSCOPY N/A 07/15/2012   Dr. Gala Romney- hiatal hernia, duodenal ulcer with surrounding erosions. this may well be related to the "haziness" sen around the head of the pancreas and duodenum on recent noncontrast ct scan and may have much to do with his abdominal pain.  Marland Kitchen EXCISION NASAL MASS N/A 07/18/2015   Procedure: EXCISION OF NASAL MASS;  Surgeon: Leta Baptist, MD;  Location: [place];  Service: ENT;  Laterality: N/A;  . INSERTION OF DIALYSIS CATHETER Right 05/21/2013    Procedure: INSERTION OF DIALYSIS CATHETER;  Surgeon: Conrad Leslie, MD;  Location: [place];  Service: Vascular;  Laterality: Right;  . IR DIALY SHUNT INTRO [place] W/IMG LEFT Left 10/29/2018  . IR FLUORO GUIDE CV LINE RIGHT  11/05/2018  . IR US GUIDE VASC ACCESS LEFT  10/29/2018  . IR US GUIDE VASC ACCESS RIGHT  11/05/2018  . LIGATION OF COMPETING BRANCHES OF ARTERIOVENOUS FISTULA Left 11/27/2018   Procedure: Ligation Of Competing Branches Of Arteriovenous Fistula;  Surgeon: [name], MD;  Location: [facility] OR;  Service: Vascular;  Laterality: Left;  Marland Kitchen MULTIPLE TOOTH EXTRACTIONS    . REVISION OF ARTERIOVENOUS GORETEX GRAFT Left 11/27/2018   Procedure: Revision of Left Arm Arteriovenious Fistula;  Surgeon: [name], MD;  Location: [facility] OR;  Service: Vascular;  Laterality: Left;  . THROMBECTOMY W/ EMBOLECTOMY Left 11/27/2018   Procedure: Thrombectomy Arteriovenous Fistula;  Surgeon: [name], MD;  Location: [facility] OR;  Service: Vascular;  Laterality: Left;  Marland Kitchen VASECTOMY      Allergies  Allergen Reactions  . Ace Inhibitors Anaphylaxis    Swelling in throat and neck  . Statins Anaphylaxis and Swelling    Caused feet, tongue and throat to swell.  . Codeine Other (See Comments)    Muscle Spasms    Current Outpatient Medications  Medication Sig Dispense Refill  . acetaminophen (TYLENOL) 650 MG CR tablet Take 1,300 mg by mouth every 8 (eight) hours as needed for pain.     Marland Kitchen albuterol (PROVENTIL,VENTOLIN) 2 MG/5ML syrup Take 2 mg by mouth 2 (two) times a day.     . albuterol (VENTOLIN HFA) 108 (90 Base) MCG/ACT inhaler Inhale 1-2 puffs into the lungs every 4 (four) hours as needed for wheezing or shortness of breath.    . ALPRAZolam (XANAX) 1 MG tablet Take 1 mg by mouth at bedtime as needed for sleep.     Marland Kitchen amLODipine (NORVASC) 10 MG tablet Take 10 mg by mouth daily at 12 noon.     Marland Kitchen b complex-vitamin c-folic acid (NEPHRO-VITE) 0.8 MG TABS tablet Take 1 tablet by mouth daily.     . butalbital-acetaminophen-caffeine (FIORICET, ESGIC) 50-325-40 MG per tablet Take 1 tablet by mouth every 6 (six) hours as needed for headache.     . calcium acetate (PHOSLO) 667 MG capsule Take 667-1,334 mg by mouth See admin instructions. Take 1334 mg with each meal and 667 mg with each snack    . Calcium Carb-Cholecalciferol (CALCIUM + D3 PO) Take 1 tablet by mouth daily.     Marland Kitchen  Cholecalciferol (VITAMIN D3) 50 MCG (2000 UT) capsule Take 4,000 Units by mouth 2 (two) times daily.     Marland Kitchen dicyclomine (BENTYL) 20 MG tablet Take 20 mg by mouth 4 (four) times daily as needed for spasms.     . diphenhydrAMINE (BENADRYL) 25 MG tablet Take 50 mg by mouth 3 (three) times daily as needed for itching.    . gabapentin (NEURONTIN) 300 MG capsule Take 300 mg by mouth See admin instructions. Take 300 mg daily, may take an additional 300 mg dose up to 2 times daily as needed for pain    . hydrochlorothiazide (HYDRODIURIL) 25 MG tablet Take 25 mg by mouth daily.    . insulin lispro (HUMALOG) 100 UNIT/ML injection Inject 10 Units into the skin 3 (three) times daily with meals.     . isosorbide mononitrate (IMDUR) 30 MG 24 hr tablet Take 30 mg by mouth daily.     Marland Kitchen LANTUS SOLOSTAR 100 UNIT/ML injection Inject 45-65 Units into the skin at bedtime. According to sugar level.    . lidocaine-prilocaine (EMLA) cream Apply 1 application topically as needed (port access).    Marland Kitchen loperamide (IMODIUM) 1 MG/5ML solution Take 1-2 mg by mouth as needed for diarrhea or loose stools.    Marland Kitchen losartan (COZAAR) 100 MG tablet Take 100 mg by mouth daily.     . magnesium oxide (MAG-OX) 400 MG tablet Take 400 mg by mouth at bedtime.     . metoprolol (LOPRESSOR) 100 MG tablet Take 100 mg by mouth 2 (two) times daily.     . Naphazoline-Glycerin (REDNESS RELIEF OP) Place 1 drop into both eyes daily as needed (redness/ dryness).    . Omega-3 Fatty Acids (FISH OIL) 1000 MG CAPS Take 2,000 mg by mouth 2 (two) times a day.    Marland Kitchen omeprazole  (PRILOSEC) 20 MG capsule Take 20 mg by mouth 2 (two) times daily before a meal.    . ondansetron (ZOFRAN) 4 MG tablet Take 4 mg by mouth every 8 (eight) hours as needed for nausea or vomiting.    . primidone (MYSOLINE) 50 MG tablet Take 50 mg by mouth at bedtime.    Marland Kitchen STIOLTO RESPIMAT 2.5-2.5 MCG/ACT AERS Inhale 2 puffs into the lungs at bedtime.    Marland Kitchen HYDROcodone-acetaminophen (NORCO) 5-325 MG tablet Take 1 tablet by mouth every 6 (six) hours as needed for moderate pain. (Patient not taking: Reported on 12/22/2018) 15 tablet 0  . NITROSTAT 0.4 MG SL tablet Place 0.4 mg under the tongue every 5 (five) minutes as needed for chest pain.      No current facility-administered medications for this visit.      REVIEW OF SYSTEMS: see HPI for pertinent positives and negatives    PHYSICAL EXAMINATION:  Vitals:   12/22/18 1401  BP: 117/61  Pulse: 73  Resp: 18  Temp: 98.1 F (36.7 C)  TempSrc: Temporal  SpO2: 94%  Weight: 213 lb 3.2 oz (96.7 kg)  Height: 5\' 11"  (1.803 m)   Body mass index is 29.74 kg/m.  General: The patient appears his stated age.   HEENT:  His nose is absent, there is a cavity at the nose bridge (s/p excision of nasal mass).  Pulmonary: Respirations are non-labored Musculoskeletal: There are no major deformities.   Neurologic: No focal weakness or paresthesias are detected, Skin: There are no ulcer or rashes noted. Left forearm incisions are well healed, no signs of infection.  Psychiatric: The patient has normal affect. Cardiovascular: There is  a regular rate and rhythm Left forearm incisions are healing well, edges well proximated, no signs of infection.  There is a hard mass with ecchymosis over the proximal incision, consistent with a hematoma. Faint bruit and thrill over the hematoma.  Brisk bruit and thrill over the distal portion of the AVF, at the level just proximal to the wrist.   Faint left radial pulse palpated.    Medical Decision Making  Thomas [name] is a 78 y.o. male who is s/p branch ligation x2 of left radiocephalic fistula, and open thrombectomy of left radiocephalic fistula on 8-40-37 by Dr. 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    There appears to be a hematoma over the proximal portion of his left forearm AVF, is a hard mass, difficult to hear bruit or palpate thrill over this portion. The distal portion of his AVF has a strong palpable thrill and audible bruit. Pt states the HD technicians only access the more proximal segment, and do not use the distal segment.  Pt is not taking any anticoagulants nor antiplatelet mediations to encourage the formation of a hematoma.  I spoke with Dr. Donzetta Matters by phone. Will give the AVF a rest until the hematoma resolves; I advised pt to let HD technicians know to use his catheter for HD, and not his AVF yet, will have pt return in 2-3 weeks with AVF duplex and see Dr. Trula Slade afterward.     Clemon Chambers, RN, MSN, FNP-C [facility] [place] Office: [phone]  12/22/2018, 2:28 PM  Clinic MD: Donzetta Matters on call

## 2018-12-31 ENCOUNTER — Encounter: Payer: Medicare Other | Admitting: Family

## 2019-01-08 ENCOUNTER — Other Ambulatory Visit: Payer: Self-pay

## 2019-01-08 DIAGNOSIS — N186 End stage renal disease: Secondary | ICD-10-CM

## 2019-01-08 DIAGNOSIS — Z992 Dependence on renal dialysis: Secondary | ICD-10-CM

## 2019-01-09 ENCOUNTER — Telehealth (HOSPITAL_COMMUNITY): Payer: Self-pay | Admitting: Rehabilitation

## 2019-01-09 NOTE — Telephone Encounter (Signed)

## 2019-01-12 ENCOUNTER — Other Ambulatory Visit: Payer: Self-pay

## 2019-01-12 ENCOUNTER — Ambulatory Visit (INDEPENDENT_AMBULATORY_CARE_PROVIDER_SITE_OTHER): Payer: Self-pay | Admitting: Surgery

## 2019-01-12 ENCOUNTER — Ambulatory Visit (HOSPITAL_COMMUNITY)
Admission: RE | Admit: 2019-01-12 | Discharge: 2019-01-12 | Disposition: A | Discharge status: Home / Self Care | Payer: Medicare Other | Source: Ambulatory Visit | Attending: Surgery | Admitting: Surgery

## 2019-01-12 ENCOUNTER — Encounter: Payer: Self-pay | Admitting: Surgery

## 2019-01-12 VITALS — BP 128/62 | HR 75 | Temp 97.8°F | Resp 20 | Ht 71.0 in | Wt 213.6 lb

## 2019-01-12 DIAGNOSIS — N186 End stage renal disease: Secondary | ICD-10-CM | POA: Diagnosis not present

## 2019-01-12 DIAGNOSIS — Z992 Dependence on renal dialysis: Secondary | ICD-10-CM | POA: Diagnosis present

## 2019-01-12 NOTE — Progress Notes (Signed)
Patient name: Thomas [name] MRN: [id number] DOB: [birth year] Sex: male  REASON FOR VISIT:     post op  HISTORY OF PRESENT ILLNESS:   Thomas [name] is a 78 y.o. male who is status post branch ligation x2 and open thrombectomy of his left radiocephalic fistula on 7/61/6073.  This fistula was created by Dr. Bridgett Larsson in 2015.  It had developed aneurysmal changes and adherent thrombus creating low flow rates.  His procedure was done to try to salvage his fistula. He develped a hematoma and is back for follow up he is dialyzing through a catheter.  CURRENT MEDICATIONS:    Current Outpatient Medications  Medication Sig Dispense Refill  . acetaminophen (TYLENOL) 650 MG CR tablet Take 1,300 mg by mouth every 8 (eight) hours as needed for pain.     Marland Kitchen albuterol (PROVENTIL,VENTOLIN) 2 MG/5ML syrup Take 2 mg by mouth 2 (two) times a day.     . albuterol (VENTOLIN HFA) 108 (90 Base) MCG/ACT inhaler Inhale 1-2 puffs into the lungs every 4 (four) hours as needed for wheezing or shortness of breath.    . ALPRAZolam (XANAX) 1 MG tablet Take 1 mg by mouth at bedtime as needed for sleep.     Marland Kitchen amLODipine (NORVASC) 10 MG tablet Take 10 mg by mouth daily at 12 noon.     Marland Kitchen b complex-vitamin c-folic acid (NEPHRO-VITE) 0.8 MG TABS tablet Take 1 tablet by mouth daily.    . butalbital-acetaminophen-caffeine (FIORICET, ESGIC) 50-325-40 MG per tablet Take 1 tablet by mouth every 6 (six) hours as needed for headache.     . calcium acetate (PHOSLO) 667 MG capsule Take 667-1,334 mg by mouth See admin instructions. Take 1334 mg with each meal and 667 mg with each snack    . Calcium Carb-Cholecalciferol (CALCIUM + D3 PO) Take 1 tablet by mouth daily.     . Cholecalciferol (VITAMIN D3) 50 MCG (2000 UT) capsule Take 4,000 Units by mouth 2 (two) times daily.     Marland Kitchen dicyclomine (BENTYL) 20 MG tablet Take 20 mg by mouth 4 (four) times daily as needed for spasms.     . diphenhydrAMINE (BENADRYL)  25 MG tablet Take 50 mg by mouth 3 (three) times daily as needed for itching.    . gabapentin (NEURONTIN) 300 MG capsule Take 300 mg by mouth See admin instructions. Take 300 mg daily, may take an additional 300 mg dose up to 2 times daily as needed for pain    . hydrochlorothiazide (HYDRODIURIL) 25 MG tablet Take 25 mg by mouth daily.    Marland Kitchen HYDROcodone-acetaminophen (NORCO) 5-325 MG tablet Take 1 tablet by mouth every 6 (six) hours as needed for moderate pain. (Patient not taking: Reported on 12/22/2018) 15 tablet 0  . insulin lispro (HUMALOG) 100 UNIT/ML injection Inject 10 Units into the skin 3 (three) times daily with meals.     . isosorbide mononitrate (IMDUR) 30 MG 24 hr tablet Take 30 mg by mouth daily.     Marland Kitchen LANTUS SOLOSTAR 100 UNIT/ML injection Inject 45-65 Units into the skin at bedtime. According to sugar level.    . lidocaine-prilocaine (EMLA) cream Apply 1 application topically as needed (port access).    Marland Kitchen loperamide (IMODIUM) 1 MG/5ML solution Take 1-2 mg by mouth as needed for diarrhea or loose stools.    Marland Kitchen losartan (COZAAR) 100 MG tablet Take 100 mg by mouth daily.     . magnesium oxide (MAG-OX) 400 MG tablet Take 400 mg by  mouth at bedtime.     . metoprolol (LOPRESSOR) 100 MG tablet Take 100 mg by mouth 2 (two) times daily.     . Naphazoline-Glycerin (REDNESS RELIEF OP) Place 1 drop into both eyes daily as needed (redness/ dryness).    Marland Kitchen NITROSTAT 0.4 MG SL tablet Place 0.4 mg under the tongue every 5 (five) minutes as needed for chest pain.     . Omega-3 Fatty Acids (FISH OIL) 1000 MG CAPS Take 2,000 mg by mouth 2 (two) times a day.    Marland Kitchen omeprazole (PRILOSEC) 20 MG capsule Take 20 mg by mouth 2 (two) times daily before a meal.    . ondansetron (ZOFRAN) 4 MG tablet Take 4 mg by mouth every 8 (eight) hours as needed for nausea or vomiting.    . primidone (MYSOLINE) 50 MG tablet Take 50 mg by mouth at bedtime.    Marland Kitchen STIOLTO RESPIMAT 2.5-2.5 MCG/ACT AERS Inhale 2 puffs into the lungs at  bedtime.     No current facility-administered medications for this visit.     REVIEW OF SYSTEMS:   [X]  denotes positive finding, [ ]  denotes negative finding Cardiac  Comments:  Chest pain or chest pressure:    Shortness of breath upon exertion:    Short of breath when lying flat:    Irregular heart rhythm:    Constitutional    Fever or chills:      PHYSICAL EXAM:   Vitals:   01/12/19 1133  BP: 128/62  Pulse: 75  Resp: 20  Temp: 97.8 F (36.6 C)  SpO2: 95%  Weight: 213 lb 9.6 oz (96.9 kg)  Height: 5\' 11"  (1.803 m)    GENERAL: The patient is a well-nourished male, in no acute distress. The vital signs are documented above. CARDIOVASCULAR: There is a regular rate and rhythm. PULMONARY: Non-labored respirations Palpable thrill within fistula.  Incisions have healed  STUDIES:   Arteriovenous fistula-Velocities less than 100cm/s noted in the throughout the outflow vein. Arteriovenous fistula-Thrombus noted in the aneurysmal dilatation, mid forearm. 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  Patent radial-cephalic fistula.  MEDICAL ISSUES:   I discussed with the patient that he can start using the fistula for dialysis access.  We also discussed that his procedure was done to try and prolong the life of his fistula which is degenerating.  At some point, he will need a more proximal fistula.  He will follow-up with me on an as-needed basis.  [name], MD, FACS [facility] [phone] Pager [phone]

## 2019-01-20 ENCOUNTER — Other Ambulatory Visit: Payer: Self-pay

## 2019-01-20 ENCOUNTER — Other Ambulatory Visit: Payer: Self-pay | Admitting: *Deleted

## 2019-01-20 ENCOUNTER — Encounter: Payer: Self-pay | Admitting: *Deleted

## 2019-01-20 ENCOUNTER — Ambulatory Visit (INDEPENDENT_AMBULATORY_CARE_PROVIDER_SITE_OTHER): Payer: Self-pay | Admitting: Vascular Surgery

## 2019-01-20 ENCOUNTER — Encounter: Payer: Self-pay | Admitting: Vascular Surgery

## 2019-01-20 ENCOUNTER — Encounter (HOSPITAL_COMMUNITY): Payer: Self-pay | Admitting: *Deleted

## 2019-01-20 ENCOUNTER — Ambulatory Visit (HOSPITAL_COMMUNITY)
Admission: RE | Admit: 2019-01-20 | Discharge: 2019-01-20 | Disposition: A | Discharge status: Home / Self Care | Payer: Medicare Other | Source: Ambulatory Visit | Attending: Vascular Surgery | Admitting: Vascular Surgery

## 2019-01-20 ENCOUNTER — Ambulatory Visit (INDEPENDENT_AMBULATORY_CARE_PROVIDER_SITE_OTHER)
Admission: RE | Admit: 2019-01-20 | Discharge: 2019-01-20 | Disposition: A | Discharge status: Home / Self Care | Payer: Medicare Other | Source: Ambulatory Visit | Attending: Vascular Surgery | Admitting: Vascular Surgery

## 2019-01-20 VITALS — BP 115/65 | HR 62 | Temp 97.7°F | Resp 14 | Ht 70.0 in | Wt 214.8 lb

## 2019-01-20 DIAGNOSIS — N186 End stage renal disease: Secondary | ICD-10-CM

## 2019-01-20 DIAGNOSIS — Z992 Dependence on renal dialysis: Secondary | ICD-10-CM

## 2019-01-20 NOTE — Progress Notes (Addendum)
Patient name: Thomas [name] MRN: [id number] DOB: 16-Mar-1941 Sex: male  REASON FOR VISIT: Urgent evaluation for nonfunctioning right IJ tunneled dialysis catheter and thrombosed left radiocephalic fistula after recent revision  HPI: Thomas [name] is a 78 y.o. male with multiple medical problems including end-stage renal disease currently on dialysis Monday Wednesday and Friday that presents as an urgent referral to be seen for a nonfunctioning right IJ tunnel dialysis catheter and a thrombosed left radiocephalic fistula after recent revision by Dr. Trula Slade.  Referral by Dr. Lowanda Foster.  Patient reports he last attempted to dialyze yesterday.  States catheter only ran for about 3 hours and then they stated it clotted and would no longer work.  They had to use the catheter after his left radiocephalic fistula was recently revised and would not run either.  It appears the fistula was initially placed in 2015 by Dr. Bridgett Larsson.  Most recently on 11/27/2018 Dr. Trula Slade performed branch ligation x2 and open thrombectomy of the left radiocephalic fistula.  Past Medical History:  Diagnosis Date  . Anemia   . Arthritis    hands, back  . Cancer (HCC)    nose  . Cardiac arrest ([place])   . Cataract   . CHF (congestive heart failure) ([place])   . Chronic kidney disease    CKD, hemodialysis Tues/Thurs/Sat  . COPD (chronic obstructive pulmonary disease) ([place])   . Coronary artery disease   . Diabetes mellitus ([place])   . Diverticulosis 2013   tcs by RMR  . Diverticulosis   . Erosive esophagitis   . GERD (gastroesophageal reflux disease)   . Headache   . Hiatal hernia    pt not aware of this  . HTN (hypertension)   . Hypercholesterolemia   . Hypothyroidism   . Irritable bowel syndrome (IBS)   . Lymphocytic colitis 2013   tcs by RMR  . Myocardial infarction ([place])    X2  . Nasal mass   . Neuropathy   . Pneumonia   . Schatzki's ring   . SDH (subdural hematoma) ([place])    02/2018, conservative management  .  Shortness of breath   . Sleep apnea    uses O2 only  . Supplemental oxygen dependent    At HS and PRN during the day  . Tobacco abuse   . Wears dentures   . Wears glasses     Past Surgical History:  Procedure Laterality Date  . AV FISTULA PLACEMENT Left 06/29/2013   Procedure: LEFT RADIAL/CEPHALIC FISTULA;  Surgeon: Conrad Steger, MD;  Location: [place];  Service: Vascular;  Laterality: Left;  . cardiac stents     5  . CATARACT EXTRACTION W/PHACO Left 05/17/2014   Procedure: CATARACT EXTRACTION PHACO AND INTRAOCULAR LENS PLACEMENT (IOC);  Surgeon: Tonny Branch, MD;  Location: [facility];  Service: Ophthalmology;  Laterality: Left;  CDE:7.71  . CATARACT EXTRACTION W/PHACO Right 06/14/2014   Procedure: CATARACT EXTRACTION PHACO AND INTRAOCULAR LENS PLACEMENT RIGHT EYE;  Surgeon: Tonny Branch, MD;  Location: [facility];  Service: Ophthalmology;  Laterality: Right;  CDE:8.22  . CHOLECYSTECTOMY    . COLONOSCOPY  December 2007   adenomatous polyps, sigmoid diverticula  . COLONOSCOPY  02/06/2012   Dr. Gala Romney- colonic diverticulosis, bx consistent with lymphocytic colitis  . CORONARY ANGIOPLASTY     x5 stents  . ESOPHAGOGASTRODUODENOSCOPY  May 2009   RMR: non-critical Schatzki's ring, sp dilation with 65 F [place], small inlet patch, mild erosive relufx esophagitis  . ESOPHAGOGASTRODUODENOSCOPY N/A 07/15/2012  Dr. Gala Romney- hiatal hernia, duodenal ulcer with surrounding erosions. this may well be related to the "haziness" sen around the head of the pancreas and duodenum on recent noncontrast ct scan and may have much to do with his abdominal pain.  Marland Kitchen EXCISION NASAL MASS N/A 07/18/2015   Procedure: EXCISION OF NASAL MASS;  Surgeon: Leta Baptist, MD;  Location: [place];  Service: ENT;  Laterality: N/A;  . INSERTION OF DIALYSIS CATHETER Right 05/21/2013   Procedure: INSERTION OF DIALYSIS CATHETER;  Surgeon: Conrad Wake, MD;  Location: [place];  Service: Vascular;  Laterality: Right;  . IR DIALY SHUNT INTRO [place] W/IMG LEFT Left 10/29/2018  . IR FLUORO GUIDE CV LINE RIGHT  11/05/2018  . IR US GUIDE VASC ACCESS LEFT  10/29/2018  . IR US GUIDE VASC ACCESS RIGHT  11/05/2018  . LIGATION OF COMPETING BRANCHES OF ARTERIOVENOUS FISTULA Left 11/27/2018   Procedure: Ligation Of Competing Branches Of Arteriovenous Fistula;  Surgeon: [name], MD;  [facility];  Service: Vascular;  Laterality: Left;  Marland Kitchen MULTIPLE TOOTH EXTRACTIONS    . REVISION OF ARTERIOVENOUS GORETEX GRAFT Left 11/27/2018   Procedure: Revision of Left Arm Arteriovenious Fistula;  Surgeon: [name], MD;  Location: [facility] OR;  Service: Vascular;  Laterality: Left;  . THROMBECTOMY W/ EMBOLECTOMY Left 11/27/2018   Procedure: Thrombectomy Arteriovenous Fistula;  Surgeon: [name], MD;  [facility];  Service: Vascular;  Laterality: Left;  Marland Kitchen VASECTOMY      Family History  Problem Relation Age of Onset  . Cancer Mother   . Cancer Father   . Diabetes Brother   . Cancer Brother   . Diabetes Sister   . Cancer Sister   . Cancer Sister   . Colon cancer Neg Hx     SOCIAL HISTORY: Social History   Tobacco Use  . Smoking status: Former Smoker    Packs/day: 0.00    Years: 60.00    Pack years: 0.00    Types: E-cigarettes, Cigarettes    Quit date: 11/10/2014    Years since quitting: 4.1  . Smokeless tobacco: Never Used  Substance Use Topics  . Alcohol use: No    Allergies  Allergen Reactions  . Ace Inhibitors Anaphylaxis    Swelling in throat and neck  . Statins Anaphylaxis and Swelling    Caused feet, tongue and throat to swell.  . Codeine Other (See Comments)    Muscle Spasms    Current Outpatient Medications  Medication Sig Dispense Refill  . acetaminophen (TYLENOL) 650 MG CR tablet Take 1,300 mg by mouth every 8 (eight) hours as needed for pain.     Marland Kitchen albuterol (PROVENTIL,VENTOLIN) 2 MG/5ML syrup Take 2 mg by mouth 2 (two) times a day.     . albuterol (VENTOLIN HFA) 108 (90 Base) MCG/ACT inhaler Inhale  1-2 puffs into the lungs every 4 (four) hours as needed for wheezing or shortness of breath.    . ALPRAZolam (XANAX) 1 MG tablet Take 1 mg by mouth at bedtime as needed for sleep.     Marland Kitchen amLODipine (NORVASC) 10 MG tablet Take 10 mg by mouth daily at 12 noon.     Marland Kitchen b complex-vitamin c-folic acid (NEPHRO-VITE) 0.8 MG TABS tablet Take 1 tablet by mouth daily.    . butalbital-acetaminophen-caffeine (FIORICET, ESGIC) 50-325-40 MG per tablet Take 1 tablet by mouth every 6 (six) hours as needed for headache.     . calcium acetate (PHOSLO) 667 MG  capsule Take 667-1,334 mg by mouth See admin instructions. Take 1334 mg with each meal and 667 mg with each snack    . Calcium Carb-Cholecalciferol (CALCIUM + D3 PO) Take 1 tablet by mouth daily.     . Cholecalciferol (VITAMIN D3) 50 MCG (2000 UT) capsule Take 4,000 Units by mouth 2 (two) times daily.     Marland Kitchen dicyclomine (BENTYL) 20 MG tablet Take 20 mg by mouth 4 (four) times daily as needed for spasms.     . diphenhydrAMINE (BENADRYL) 25 MG tablet Take 50 mg by mouth 3 (three) times daily as needed for itching.    . gabapentin (NEURONTIN) 300 MG capsule Take 300 mg by mouth See admin instructions. Take 300 mg daily, may take an additional 300 mg dose up to 2 times daily as needed for pain    . hydrochlorothiazide (HYDRODIURIL) 25 MG tablet Take 25 mg by mouth daily.    Marland Kitchen HYDROcodone-acetaminophen (NORCO) 5-325 MG tablet Take 1 tablet by mouth every 6 (six) hours as needed for moderate pain. 15 tablet 0  . insulin lispro (HUMALOG) 100 UNIT/ML injection Inject 10 Units into the skin 3 (three) times daily with meals.     . isosorbide mononitrate (IMDUR) 30 MG 24 hr tablet Take 30 mg by mouth daily.     Marland Kitchen LANTUS SOLOSTAR 100 UNIT/ML injection Inject 45-65 Units into the skin at bedtime. According to sugar level.    . lidocaine-prilocaine (EMLA) cream Apply 1 application topically as needed (port access).    Marland Kitchen loperamide (IMODIUM) 1 MG/5ML solution Take 1-2 mg by mouth  as needed for diarrhea or loose stools.    Marland Kitchen losartan (COZAAR) 100 MG tablet Take 100 mg by mouth daily.     . magnesium oxide (MAG-OX) 400 MG tablet Take 400 mg by mouth at bedtime.     . metoprolol (LOPRESSOR) 100 MG tablet Take 100 mg by mouth 2 (two) times daily.     . Naphazoline-Glycerin (REDNESS RELIEF OP) Place 1 drop into both eyes daily as needed (redness/ dryness).    Marland Kitchen NITROSTAT 0.4 MG SL tablet Place 0.4 mg under the tongue every 5 (five) minutes as needed for chest pain.     . Omega-3 Fatty Acids (FISH OIL) 1000 MG CAPS Take 2,000 mg by mouth 2 (two) times a day.    Marland Kitchen omeprazole (PRILOSEC) 20 MG capsule Take 20 mg by mouth 2 (two) times daily before a meal.    . ondansetron (ZOFRAN) 4 MG tablet Take 4 mg by mouth every 8 (eight) hours as needed for nausea or vomiting.    . primidone (MYSOLINE) 50 MG tablet Take 50 mg by mouth at bedtime.    Marland Kitchen STIOLTO RESPIMAT 2.5-2.5 MCG/ACT AERS Inhale 2 puffs into the lungs at bedtime.     No current facility-administered medications for this visit.     REVIEW OF SYSTEMS:  [X]  denotes positive finding, [ ]  denotes negative finding Cardiac  Comments:  Chest pain or chest pressure:    Shortness of breath upon exertion:    Short of breath when lying flat:    Irregular heart rhythm:        Vascular    Pain in calf, thigh, or hip brought on by ambulation:    Pain in feet at night that wakes you up from your sleep:     Blood clot in your veins:    Leg swelling:         Pulmonary  Oxygen at home:    Productive cough:     Wheezing:         Neurologic    Sudden weakness in arms or legs:     Sudden numbness in arms or legs:     Sudden onset of difficulty speaking or slurred speech:    Temporary loss of vision in one eye:     Problems with dizziness:         Gastrointestinal    Blood in stool:     Vomited blood:         Genitourinary    Burning when urinating:     Blood in urine:        Psychiatric    Major depression:          Hematologic    Bleeding problems:    Problems with blood clotting too easily:        Skin    Rashes or ulcers:        Constitutional    Fever or chills:      PHYSICAL EXAM: Vitals:   01/20/19 1125  BP: 115/65  Pulse: 62  Resp: 14  Temp: 97.7 F (36.5 C)  TempSrc: Temporal  SpO2: 97%  Weight: 214 lb 12.8 oz (97.4 kg)  Height: 5\' 10"  (1.778 m)    GENERAL: The patient is a well-nourished male, in no acute distress. The vital signs are documented above. CARDIAC: There is a regular rate and rhythm.  VASCULAR:  Thrill at level of left wrist, no thrill proximal forearm Barely audible bruit proximal forearm near recent revision RIJ tunneled dialysis catheter Left radial and brachial palpable  DATA:   I independently reviewed fistula duplex of the left arm and he has a fair amount of laminar thrombus in the fistula throughout his forearm at site of recent revision.  Assessment/Plan:  78 year old male with end-stage renal disease that presents with nonfunctioning right IJ tunneled dialysis catheter as well as malfunctioning left radiocephalic fistula after recent revision and open thrombectomy by Dr. Trula Slade.  Discussed he will need a right IJ tunneled catheter exchange tomorrow in the operating room since he has no dialysis access currently.  In addition I think we should convert him to a left upper arm fistula.  In reviewing his previous imaging he has a very nice basilic vein in the left arm where the radiocephalic fistula empties into based on previous IR shuntogram.  He also has a cephalic vein on upper arm vein mapping as well.  I don't see any role for another attempt at open thrombectomy of his radiocephalic fistula given this was already done by Dr. Trula Slade and he now has no appreciable thrill on exam other than a very weakly audible bruit and now a fair amount of re-current thrombus evident on his duplex.  Discussed this with him and his wife in detail.    Marty Heck, MD [facility] [place] Office: [phone] Pager: [place]

## 2019-01-20 NOTE — Progress Notes (Addendum)
I spoke to Toney Reil, patient's daughter. Patient lives with his daughter and son in [place].  Jonelle Sidle said that she manages Mr Searight's medication and is POA.   I found a copy of release signed by patient , instructing to call Jonelle Sidle regarding important information. Jonelle Sidle does not have patient medical record with her , she will send it with patient with the last time taken documented.  Beryle Flock will be with patient in am.  Mr Dimond has type II diabetes, last A1C was 7. 8 per Mrs Verne Spurr - drawn at kidney center.Mrs Verne Spurr reports that she was instructed to give patient 1/2 of Lantus and hart medications and blood pressure medications. I instructed Mrs Zenon Mayo not give HCTZ in am.  I instructed Mrs Verne Spurr that patient should take 1/2 of Novolog in am if CBG > 220.  I instructed patient to check CBG after awaking and every 2 hours until arrival  to the hospital.  I Instructed patient if CBG is less than 70 to take 4 Glucose Tablets  Recheck CBG in 15 minutes then call pre- op desk at [phone] for further instructions.

## 2019-01-21 ENCOUNTER — Ambulatory Visit (HOSPITAL_COMMUNITY): Payer: Medicare Other

## 2019-01-21 ENCOUNTER — Encounter (HOSPITAL_COMMUNITY)
Admission: RE | Disposition: A | Discharge status: Home / Self Care | Payer: Self-pay | Source: Home / Self Care | Attending: Vascular Surgery

## 2019-01-21 ENCOUNTER — Encounter (HOSPITAL_COMMUNITY): Payer: Self-pay | Admitting: Orthopedic Surgery

## 2019-01-21 ENCOUNTER — Ambulatory Visit (HOSPITAL_COMMUNITY)
Admission: RE | Admit: 2019-01-21 | Discharge: 2019-01-21 | Disposition: A | Discharge status: Home / Self Care | Payer: Medicare Other | Source: Home / Self Care | Attending: Vascular Surgery | Admitting: Vascular Surgery

## 2019-01-21 ENCOUNTER — Other Ambulatory Visit: Payer: Self-pay

## 2019-01-21 DIAGNOSIS — G4730 Sleep apnea, unspecified: Secondary | ICD-10-CM | POA: Insufficient documentation

## 2019-01-21 DIAGNOSIS — N186 End stage renal disease: Secondary | ICD-10-CM | POA: Insufficient documentation

## 2019-01-21 DIAGNOSIS — Z9981 Dependence on supplemental oxygen: Secondary | ICD-10-CM | POA: Insufficient documentation

## 2019-01-21 DIAGNOSIS — Z79899 Other long term (current) drug therapy: Secondary | ICD-10-CM | POA: Insufficient documentation

## 2019-01-21 DIAGNOSIS — Y832 Surgical operation with anastomosis, bypass or graft as the cause of abnormal reaction of the patient, or of later complication, without mention of misadventure at the time of the procedure: Secondary | ICD-10-CM | POA: Insufficient documentation

## 2019-01-21 DIAGNOSIS — Z992 Dependence on renal dialysis: Secondary | ICD-10-CM | POA: Insufficient documentation

## 2019-01-21 DIAGNOSIS — N185 Chronic kidney disease, stage 5: Secondary | ICD-10-CM

## 2019-01-21 DIAGNOSIS — Z794 Long term (current) use of insulin: Secondary | ICD-10-CM | POA: Insufficient documentation

## 2019-01-21 DIAGNOSIS — T8241XA Breakdown (mechanical) of vascular dialysis catheter, initial encounter: Secondary | ICD-10-CM | POA: Insufficient documentation

## 2019-01-21 DIAGNOSIS — D631 Anemia in chronic kidney disease: Secondary | ICD-10-CM | POA: Insufficient documentation

## 2019-01-21 DIAGNOSIS — I509 Heart failure, unspecified: Secondary | ICD-10-CM | POA: Insufficient documentation

## 2019-01-21 DIAGNOSIS — T82868A Thrombosis of vascular prosthetic devices, implants and grafts, initial encounter: Secondary | ICD-10-CM | POA: Insufficient documentation

## 2019-01-21 DIAGNOSIS — Z20828 Contact with and (suspected) exposure to other viral communicable diseases: Secondary | ICD-10-CM | POA: Insufficient documentation

## 2019-01-21 DIAGNOSIS — I2510 Atherosclerotic heart disease of native coronary artery without angina pectoris: Secondary | ICD-10-CM | POA: Insufficient documentation

## 2019-01-21 DIAGNOSIS — Z87891 Personal history of nicotine dependence: Secondary | ICD-10-CM | POA: Insufficient documentation

## 2019-01-21 DIAGNOSIS — Y838 Other surgical procedures as the cause of abnormal reaction of the patient, or of later complication, without mention of misadventure at the time of the procedure: Secondary | ICD-10-CM | POA: Insufficient documentation

## 2019-01-21 DIAGNOSIS — J189 Pneumonia, unspecified organism: Secondary | ICD-10-CM | POA: Diagnosis not present

## 2019-01-21 DIAGNOSIS — Z9889 Other specified postprocedural states: Secondary | ICD-10-CM

## 2019-01-21 DIAGNOSIS — J449 Chronic obstructive pulmonary disease, unspecified: Secondary | ICD-10-CM | POA: Insufficient documentation

## 2019-01-21 DIAGNOSIS — Z85828 Personal history of other malignant neoplasm of skin: Secondary | ICD-10-CM | POA: Insufficient documentation

## 2019-01-21 DIAGNOSIS — E1140 Type 2 diabetes mellitus with diabetic neuropathy, unspecified: Secondary | ICD-10-CM | POA: Insufficient documentation

## 2019-01-21 DIAGNOSIS — Z8674 Personal history of sudden cardiac arrest: Secondary | ICD-10-CM | POA: Insufficient documentation

## 2019-01-21 DIAGNOSIS — I252 Old myocardial infarction: Secondary | ICD-10-CM | POA: Insufficient documentation

## 2019-01-21 DIAGNOSIS — R0902 Hypoxemia: Secondary | ICD-10-CM | POA: Diagnosis not present

## 2019-01-21 DIAGNOSIS — I132 Hypertensive heart and chronic kidney disease with heart failure and with stage 5 chronic kidney disease, or end stage renal disease: Secondary | ICD-10-CM | POA: Insufficient documentation

## 2019-01-21 DIAGNOSIS — E1122 Type 2 diabetes mellitus with diabetic chronic kidney disease: Secondary | ICD-10-CM | POA: Insufficient documentation

## 2019-01-21 DIAGNOSIS — K219 Gastro-esophageal reflux disease without esophagitis: Secondary | ICD-10-CM | POA: Insufficient documentation

## 2019-01-21 DIAGNOSIS — K589 Irritable bowel syndrome without diarrhea: Secondary | ICD-10-CM | POA: Insufficient documentation

## 2019-01-21 HISTORY — DX: Dependence on renal dialysis: N18.6

## 2019-01-21 HISTORY — DX: End stage renal disease: Z99.2

## 2019-01-21 HISTORY — DX: Family history of other specified conditions: Z84.89

## 2019-01-21 HISTORY — DX: Fasciculation: R25.3

## 2019-01-21 HISTORY — PX: BASCILIC VEIN TRANSPOSITION: SHX5742

## 2019-01-21 HISTORY — PX: EXCHANGE OF A DIALYSIS CATHETER: SHX5818

## 2019-01-21 LAB — POCT I-STAT 4, (NA,K, GLUC, HGB,HCT)
Glucose, Bld: 156 mg/dL — ABNORMAL HIGH (ref 70–99)
HCT: 33 % — ABNORMAL LOW (ref 39.0–52.0)
Hemoglobin: 11.2 g/dL — ABNORMAL LOW (ref 13.0–17.0)
Potassium: 3.7 mmol/L (ref 3.5–5.1)
Sodium: 134 mmol/L — ABNORMAL LOW (ref 135–145)

## 2019-01-21 LAB — PROTIME-INR
INR: 1.1 (ref 0.8–1.2)
Prothrombin Time: 13.8 seconds (ref 11.4–15.2)

## 2019-01-21 LAB — GLUCOSE, CAPILLARY
Glucose-Capillary: 146 mg/dL — ABNORMAL HIGH (ref 70–99)
Glucose-Capillary: 129 mg/dL — ABNORMAL HIGH (ref 70–99)
Glucose-Capillary: 141 mg/dL — ABNORMAL HIGH (ref 70–99)

## 2019-01-21 LAB — SARS CORONAVIRUS 2 BY RT PCR (HOSPITAL ORDER, PERFORMED IN ~~LOC~~ HOSPITAL LAB): SARS Coronavirus 2: NEGATIVE

## 2019-01-21 SURGERY — EXCHANGE OF A DIALYSIS CATHETER
Anesthesia: Monitor Anesthesia Care | Site: Chest | Laterality: Right

## 2019-01-21 MED ORDER — SODIUM CHLORIDE 0.9 % IV SOLN
INTRAVENOUS | Status: DC
  Administered 2019-01-21: 09:00:00 via INTRAVENOUS

## 2019-01-21 MED ORDER — ALBUMIN HUMAN 5 % IV SOLN
INTRAVENOUS | Status: AC
  Filled 2019-01-21: qty 250

## 2019-01-21 MED ORDER — OXYCODONE-ACETAMINOPHEN 5-325 MG PO TABS: 1.0000 | ORAL_TABLET | Freq: Four times a day (QID) | ORAL | 0 refills | Status: DC | PRN

## 2019-01-21 MED ORDER — ACETAMINOPHEN 500 MG PO TABS
ORAL_TABLET | ORAL | Status: AC
  Filled 2019-01-21: qty 1

## 2019-01-21 MED ORDER — IPRATROPIUM-ALBUTEROL 0.5-2.5 (3) MG/3ML IN SOLN
RESPIRATORY_TRACT | Status: AC
  Filled 2019-01-21: qty 3

## 2019-01-21 MED ORDER — SODIUM CHLORIDE 0.9 % IV SOLN
INTRAVENOUS | Status: DC | PRN
  Administered 2019-01-21: 500 mL

## 2019-01-21 MED ORDER — PROPOFOL 500 MG/50ML IV EMUL
INTRAVENOUS | Status: DC | PRN
  Administered 2019-01-21: 100 ug/kg/min via INTRAVENOUS

## 2019-01-21 MED ORDER — HEPARIN SODIUM (PORCINE) 1000 UNIT/ML IJ SOLN
INTRAMUSCULAR | Status: AC
  Filled 2019-01-21: qty 1

## 2019-01-21 MED ORDER — PROPOFOL 10 MG/ML IV BOLUS
INTRAVENOUS | Status: AC
  Filled 2019-01-21: qty 20

## 2019-01-21 MED ORDER — ONDANSETRON HCL 4 MG/2ML IJ SOLN
4.0000 mg | Freq: Once | INTRAMUSCULAR | Status: AC | PRN
  Administered 2019-01-21: 4 mg via INTRAVENOUS

## 2019-01-21 MED ORDER — LIDOCAINE-EPINEPHRINE (PF) 1 %-1:200000 IJ SOLN
INTRAMUSCULAR | Status: AC
  Filled 2019-01-21: qty 30

## 2019-01-21 MED ORDER — ACETAMINOPHEN 500 MG PO TABS
500.0000 mg | ORAL_TABLET | Freq: Once | ORAL | Status: AC
  Administered 2019-01-21: 500 mg via ORAL

## 2019-01-21 MED ORDER — EPHEDRINE SULFATE 50 MG/ML IJ SOLN
INTRAMUSCULAR | Status: DC | PRN
  Administered 2019-01-21: 5 mg via INTRAVENOUS

## 2019-01-21 MED ORDER — FENTANYL CITRATE (PF) 100 MCG/2ML IJ SOLN
25.0000 ug | INTRAMUSCULAR | Status: DC | PRN
  Administered 2019-01-21: 25 ug via INTRAVENOUS

## 2019-01-21 MED ORDER — HEPARIN SODIUM (PORCINE) 1000 UNIT/ML IJ SOLN
INTRAMUSCULAR | Status: DC | PRN
  Administered 2019-01-21: 3000 [IU]

## 2019-01-21 MED ORDER — OXYCODONE-ACETAMINOPHEN 5-325 MG PO TABS
ORAL_TABLET | ORAL | Status: AC
  Filled 2019-01-21: qty 1

## 2019-01-21 MED ORDER — LIDOCAINE-EPINEPHRINE (PF) 1 %-1:200000 IJ SOLN
INTRAMUSCULAR | Status: DC | PRN
  Administered 2019-01-21: 30 mL
  Administered 2019-01-21: 5 mL

## 2019-01-21 MED ORDER — SODIUM CHLORIDE 0.9 % IV SOLN
INTRAVENOUS | Status: DC | PRN
  Administered 2019-01-21: 25 ug/min via INTRAVENOUS

## 2019-01-21 MED ORDER — ONDANSETRON HCL 4 MG/2ML IJ SOLN
INTRAMUSCULAR | Status: AC
  Filled 2019-01-21: qty 2

## 2019-01-21 MED ORDER — FENTANYL CITRATE (PF) 100 MCG/2ML IJ SOLN
INTRAMUSCULAR | Status: DC | PRN
  Administered 2019-01-21 (×2): 50 ug via INTRAVENOUS

## 2019-01-21 MED ORDER — FENTANYL CITRATE (PF) 250 MCG/5ML IJ SOLN
INTRAMUSCULAR | Status: AC
  Filled 2019-01-21: qty 5

## 2019-01-21 MED ORDER — DIPHENHYDRAMINE HCL 25 MG PO CAPS
ORAL_CAPSULE | ORAL | Status: AC
  Filled 2019-01-21: qty 1

## 2019-01-21 MED ORDER — ALBUMIN HUMAN 5 % IV SOLN
12.5000 g | Freq: Once | INTRAVENOUS | Status: AC
  Administered 2019-01-21: 12.5 g via INTRAVENOUS

## 2019-01-21 MED ORDER — OXYCODONE-ACETAMINOPHEN 5-325 MG PO TABS
1.0000 | ORAL_TABLET | Freq: Once | ORAL | Status: AC
  Administered 2019-01-21: 1 via ORAL

## 2019-01-21 MED ORDER — 0.9 % SODIUM CHLORIDE (POUR BTL) OPTIME
TOPICAL | Status: DC | PRN
  Administered 2019-01-21: 1000 mL

## 2019-01-21 MED ORDER — ONDANSETRON HCL 4 MG/2ML IJ SOLN
INTRAMUSCULAR | Status: DC | PRN
  Administered 2019-01-21: 4 mg via INTRAVENOUS

## 2019-01-21 MED ORDER — PAPAVERINE HCL 30 MG/ML IJ SOLN
INTRAMUSCULAR | Status: AC
  Filled 2019-01-21: qty 2

## 2019-01-21 MED ORDER — DIPHENHYDRAMINE HCL 25 MG PO CAPS
25.0000 mg | ORAL_CAPSULE | Freq: Once | ORAL | Status: AC
  Administered 2019-01-21: 25 mg via ORAL

## 2019-01-21 MED ORDER — CEFAZOLIN SODIUM-DEXTROSE 2-4 GM/100ML-% IV SOLN
2.0000 g | INTRAVENOUS | Status: AC
  Administered 2019-01-21: 2 g via INTRAVENOUS
  Filled 2019-01-21: qty 100

## 2019-01-21 MED ORDER — FENTANYL CITRATE (PF) 100 MCG/2ML IJ SOLN
INTRAMUSCULAR | Status: AC
  Filled 2019-01-21: qty 2

## 2019-01-21 MED ORDER — SODIUM CHLORIDE 0.9 % IV SOLN
INTRAVENOUS | Status: AC
  Filled 2019-01-21: qty 1.2

## 2019-01-21 MED ORDER — IPRATROPIUM-ALBUTEROL 0.5-2.5 (3) MG/3ML IN SOLN
3.0000 mL | Freq: Once | RESPIRATORY_TRACT | Status: AC
  Administered 2019-01-21: 3 mL via RESPIRATORY_TRACT

## 2019-01-21 SURGICAL SUPPLY — 58 items
ADH SKN CLS APL DERMABOND .7 (GAUZE/BANDAGES/DRESSINGS) ×6
ARMBAND PINK RESTRICT EXTREMIT (MISCELLANEOUS) ×4 IMPLANT
BAG DECANTER FOR FLEXI CONT (MISCELLANEOUS) ×4 IMPLANT
BIOPATCH RED 1 DISK 7.0 (GAUZE/BANDAGES/DRESSINGS) ×4 IMPLANT
CANISTER SUCT 3000ML PPV (MISCELLANEOUS) ×4 IMPLANT
CATH PALINDROME RT-P 15FX19CM (CATHETERS) ×2 IMPLANT
CATH PALINDROME RT-P 15FX23CM (CATHETERS) IMPLANT
CATH PALINDROME RT-P 15FX28CM (CATHETERS) IMPLANT
CATH PALINDROME RT-P 15FX55CM (CATHETERS) IMPLANT
CLIP VESOCCLUDE MED 24/CT (CLIP) ×2 IMPLANT
CLIP VESOCCLUDE MED 6/CT (CLIP) ×6 IMPLANT
CLIP VESOCCLUDE SM WIDE 24/CT (CLIP) ×2 IMPLANT
CLIP VESOCCLUDE SM WIDE 6/CT (CLIP) ×6 IMPLANT
COVER PROBE W GEL 5X96 (DRAPES) ×4 IMPLANT
COVER SURGICAL LIGHT HANDLE (MISCELLANEOUS) ×2 IMPLANT
COVER WAND RF STERILE (DRAPES) ×2 IMPLANT
DERMABOND ADVANCED (GAUZE/BANDAGES/DRESSINGS) ×2
DERMABOND ADVANCED .7 DNX12 (GAUZE/BANDAGES/DRESSINGS) ×4 IMPLANT
DRAPE C-ARM 42X72 X-RAY (DRAPES) ×4 IMPLANT
DRAPE CHEST BREAST 15X10 FENES (DRAPES) ×2 IMPLANT
ELECT REM PT RETURN 9FT ADLT (ELECTROSURGICAL) ×4
ELECTRODE REM PT RTRN 9FT ADLT (ELECTROSURGICAL) ×3 IMPLANT
GAUZE 4X4 16PLY RFD (DISPOSABLE) ×4 IMPLANT
GAUZE SPONGE 2X2 8PLY STRL LF (GAUZE/BANDAGES/DRESSINGS) IMPLANT
GAUZE SPONGE 4X4 16PLY XRAY LF (GAUZE/BANDAGES/DRESSINGS) ×2 IMPLANT
GLOVE BIO SURGEON STRL SZ7.5 (GLOVE) ×4 IMPLANT
GOWN STRL REUS W/ TWL LRG LVL3 (GOWN DISPOSABLE) ×6 IMPLANT
GOWN STRL REUS W/ TWL XL LVL3 (GOWN DISPOSABLE) ×3 IMPLANT
GOWN STRL REUS W/TWL LRG LVL3 (GOWN DISPOSABLE) ×8
GOWN STRL REUS W/TWL XL LVL3 (GOWN DISPOSABLE) ×4
INSERT FOGARTY SM (MISCELLANEOUS) IMPLANT
KIT BASIN OR (CUSTOM PROCEDURE TRAY) ×4 IMPLANT
KIT TURNOVER KIT B (KITS) ×4 IMPLANT
NDL 18GX1X1/2 (RX/OR ONLY) (NEEDLE) ×2 IMPLANT
NDL HYPO 25GX1X1/2 BEV (NEEDLE) ×2 IMPLANT
NEEDLE 18GX1X1/2 (RX/OR ONLY) (NEEDLE) ×4 IMPLANT
NEEDLE HYPO 25GX1X1/2 BEV (NEEDLE) IMPLANT
NS IRRIG 1000ML POUR BTL (IV SOLUTION) ×4 IMPLANT
PACK CV ACCESS (CUSTOM PROCEDURE TRAY) ×4 IMPLANT
PACK SURGICAL SETUP 50X90 (CUSTOM PROCEDURE TRAY) ×4 IMPLANT
PAD ARMBOARD 7.5X6 YLW CONV (MISCELLANEOUS) ×8 IMPLANT
SOAP 2 % CHG 4 OZ (WOUND CARE) ×4 IMPLANT
SPONGE GAUZE 2X2 STER 10/PKG (GAUZE/BANDAGES/DRESSINGS)
SPONGE LAP 18X18 X RAY DECT (DISPOSABLE) ×2 IMPLANT
SUT ETHILON 3 0 PS 1 (SUTURE) ×4 IMPLANT
SUT MNCRL AB 4-0 PS2 18 (SUTURE) ×8 IMPLANT
SUT PROLENE 5 0 C 1 24 (SUTURE) ×2 IMPLANT
SUT PROLENE 6 0 BV (SUTURE) ×6 IMPLANT
SUT SILK 2 0 SH (SUTURE) ×2 IMPLANT
SUT VIC AB 3-0 SH 27 (SUTURE) ×12
SUT VIC AB 3-0 SH 27X BRD (SUTURE) ×5 IMPLANT
SYR 10ML LL (SYRINGE) ×4 IMPLANT
SYR 20ML LL LF (SYRINGE) ×4 IMPLANT
SYR 5ML LL (SYRINGE) ×4 IMPLANT
SYR CONTROL 10ML LL (SYRINGE) ×2 IMPLANT
TOWEL GREEN STERILE (TOWEL DISPOSABLE) ×4 IMPLANT
UNDERPAD 30X30 (UNDERPADS AND DIAPERS) ×4 IMPLANT
WATER STERILE IRR 1000ML POUR (IV SOLUTION) ×4 IMPLANT

## 2019-01-21 NOTE — Anesthesia Preprocedure Evaluation (Addendum)
Anesthesia Evaluation  Patient identified by MRN, date of birth, ID band Patient awake    Reviewed: Allergy & Precautions, NPO status , Patient's Chart, lab work & pertinent test results  Airway Mallampati: III  TM Distance: >3 FB Neck ROM: Full    Dental  (+) Edentulous Upper, Edentulous Lower   Pulmonary sleep apnea and Oxygen sleep apnea , COPD,  oxygen dependent, former smoker,    Pulmonary exam normal breath sounds clear to auscultation       Cardiovascular hypertension, Pt. on medications and Pt. on home beta blockers + CAD and + Past MI  Normal cardiovascular exam Rhythm:Regular Rate:Normal  ECG: NSR, rate 73   Neuro/Psych  Headaches, PSYCHIATRIC DISORDERS Neuropathy  Neuromuscular disease    GI/Hepatic Neg liver ROS, hiatal hernia, PUD, GERD  Medicated and Controlled,  Endo/Other  diabetes, Insulin Dependent  Renal/GU Renal disease     Musculoskeletal negative musculoskeletal ROS (+)   Abdominal (+) + obese,   Peds  Hematology  (+) anemia , HLD   Anesthesia Other Findings end stage renal disease  Reproductive/Obstetrics                            Anesthesia Physical Anesthesia Plan  ASA: IV  Anesthesia Plan: MAC   Post-op Pain Management:    Induction: Intravenous  PONV Risk Score and Plan: 1 and Ondansetron, Dexamethasone, Propofol infusion and Treatment may vary due to age or medical condition  Airway Management Planned: Simple Face Mask  Additional Equipment:   Intra-op Plan:   Post-operative Plan:   Informed Consent: I have reviewed the patients History and Physical, chart, labs and discussed the procedure including the risks, benefits and alternatives for the proposed anesthesia with the patient or authorized representative who has indicated his/her understanding and acceptance.       Plan Discussed with: CRNA  Anesthesia Plan Comments:         Anesthesia Quick Evaluation

## 2019-01-21 NOTE — Discharge Instructions (Signed)
° °  [facility] [place] ° °Discharge Instructions ° °AV Fistula or Graft Surgery for Dialysis Access ° °Please refer to the following instructions for your post-procedure care. Your surgeon or physician assistant will discuss any changes with you. ° °Activity ° °You may drive the day following your surgery, if you are comfortable and no longer taking prescription pain medication. Resume full activity as the soreness in your incision resolves. ° °Bathing/Showering ° °You may shower after you go home. Keep your incision dry for 48 hours. Do not soak in a bathtub, hot tub, or swim until the incision heals completely. You may not shower if you have a hemodialysis catheter. ° °Incision Care ° °Clean your incision with mild soap and water after 48 hours. Pat the area dry with a clean towel. You do not need a bandage unless otherwise instructed. Do not apply any ointments or creams to your incision. You may have skin glue on your incision. Do not peel it off. It will come off on its own in about one week. Your arm may swell a bit after surgery. To reduce swelling use pillows to elevate your arm so it is above your heart. Your doctor will tell you if you need to lightly wrap your arm with an ACE bandage. ° °Diet ° °Resume your normal diet. There are not special food restrictions following this procedure. In order to heal from your surgery, it is CRITICAL to get adequate nutrition. Your body requires vitamins, minerals, and protein. Vegetables are the best source of vitamins and minerals. Vegetables also provide the perfect balance of protein. Processed food has little nutritional value, so try to avoid this. ° °Medications ° °Resume taking all of your medications. If your incision is causing pain, you may take over-the counter pain relievers such as acetaminophen (Tylenol). If you were prescribed a stronger pain medication, please be aware these medications can cause nausea and constipation. Prevent  nausea by taking the medication with a snack or meal. Avoid constipation by drinking plenty of fluids and eating foods with high amount of fiber, such as fruits, vegetables, and grains. Do not take Tylenol if you are taking prescription pain medications. ° ° ° ° °Follow up °Your surgeon may want to see you in the office following your access surgery. If so, this will be arranged at the time of your surgery. ° °Please call us immediately for any of the following conditions: ° °Increased pain, redness, drainage (pus) from your incision site °Fever of 101 degrees or higher °Severe or worsening pain at your incision site °Hand pain or numbness. ° °Reduce your risk of vascular disease: ° °Stop smoking. If you would like help, call [facility] at 1-800-QUIT-NOW (1-800-784-8669) or [place] at 336-586-4000 ° °Manage your cholesterol °Maintain a desired weight °Control your diabetes °Keep your blood pressure down ° °Dialysis ° °It will take several weeks to several months for your new dialysis access to be ready for use. Your surgeon will determine when it is OK to use it. Your nephrologist will continue to direct your dialysis. You can continue to use your Permcath until your new access is ready for use. ° °If you have any questions, please call the office at [phone]. ° °

## 2019-01-21 NOTE — Anesthesia Procedure Notes (Signed)
Procedure Name: MAC Date/Time: 01/21/2019 11:10 AM Performed by: Lieutenant Diego, CRNA Pre-anesthesia Checklist: Patient identified, Emergency Drugs available, Suction available, Patient being monitored and Timeout performed Patient Re-evaluated:Patient Re-evaluated prior to induction Oxygen Delivery Method: Simple face mask Preoxygenation: Pre-oxygenation with 100% oxygen Induction Type: IV induction

## 2019-01-21 NOTE — Op Note (Signed)
Patient name: CYREE CHUONG MRN: [id number] DOB: 07/29/1940 Sex: male  01/21/2019 Pre-operative Diagnosis:  Post-operative diagnosis:  Same Surgeon:  Erlene Quan C. Donzetta Matters, MD Procedure Performed: 1.  Left IJ 19cm tunneled dialysis catheter placement 2.  Single-stage left upper extremity basilic vein fistula creation  Indications: 78 year old male previously on dialysis via left forearm AV fistula and has nonfunctional right IJ tunneled dialysis catheter.  He is indicated for tunneled dialysis catheter placement as well as conversion to upper arm AV fistula creation.  Findings: Existing catheter was transected and rewired and new 19 cm tunneled dialysis catheter was placed terminating at the SVC atrial junction.  There was a large basilic vein extending from the antecubital up to the upper arm.  This was used to create single-stage transposed fistula to the brachial artery at the antecubitum.  At completion there is a strong thrill and a good signal in the radial artery at the wrist.  We will consider using this fistula at the 4 to 6-week time.  Given that it is mostly matured from previous fistula.   Procedure:  The patient was identified in the holding area and taken to the operating room where MAC anesthesia induced.  Sterilely prepped and draped in the right neck left upper extremity usual fashion antibiotics were administered timeout called.  Ultrasound was used to identify his fistula.  The left upper extremity area was anesthetized 1% lidocaine with epinephrine as was the existing catheter site and proposed new catheter site.  I then made an incision over the existing catheter cut down to it.  I transected the catheter remove the other portion without touching it and then rewired this centrally and remove that catheter.  The introducer sheath was placed under fluoroscopic guidance.  I tunneled a new 19 cm catheter trimmed to size and placed into the SVC atrial junction.  Was assembled flushed  with heparinized saline locked with concentrated heparin 1.5 cc in either port.  Was affixed the skin with 3-0 nylon suture and the other incision was closed for Monocryl.  Dermabond placed to both sides and a sterile dressing was placed.  I then turned my attention to the left upper extremity.  Transverse incision was made below the antecubital where we identified the vein marked of orientation.  I dissected out the brachial artery deeper through the fascia placed vessel loop around this.  I then made to skip incisions to the level of the axilla dissecting out the entirety the vein.  Branches were taken between clips and ties.  I was careful to protect the nerve throughout its course.  We had mobilized the entire vein transected distally oversewed the very distal end with 5-0 Prolene suture in a running mattress fashion.  I then flushed with heparinized saline repaired 3 areas marked the entire vein for orientation and tunneled laterally.  I then clamped the brachial artery distally and proximally opened longitudinally flushed with heparinized saline only distally given that the artery is very large proximally.  I then sewed the vein end-to-side with 6-0 Prolene suture.  Prior to completion anastomosis allowed flushing all directions.  Upon completion there was a very strong thrill in the fistula.  There was good radial artery signal at the wrist.  We irrigated the wounds closed in layers of Vicryl and Monocryl.  Dermabond placed to level the skin.  He was awakened anesthesia having tolerated procedure without immediate complication.  All counts were correct at completion.  EBL: 100cc   Evertt Chouinard C.  Donzetta Matters, MD [facility] Office: [phone] Pager: [phone]

## 2019-01-21 NOTE — H&P (Signed)
   History and Physical Update  The patient was interviewed and re-examined.  The patient's previous History and Physical has been reviewed and is unchanged from recent office visit. Plan for tdc and left arm avf creation.  Khira Cudmore C. Donzetta Matters, MD [facility] [place] Office: [phone] Pager: [phone]  01/21/2019, 10:09 AM

## 2019-01-21 NOTE — Anesthesia Postprocedure Evaluation (Signed)
Anesthesia Post Note  Patient: Thomas [name]  Procedure(s) Performed: EXCHANGE OF A DIALYSIS CATHETER, right internal jugular (Right Chest) left arm Bascilic Vein Transposition (Left Arm Upper)     Patient location during evaluation: PACU Anesthesia Type: MAC Level of consciousness: awake Pain management: pain level controlled Vital Signs Assessment: post-procedure vital signs reviewed and stable Respiratory status: spontaneous breathing, nonlabored ventilation, respiratory function stable and patient connected to nasal cannula oxygen Cardiovascular status: stable and blood pressure returned to baseline Postop Assessment: no apparent nausea or vomiting Anesthetic complications: no    Last Vitals:  Vitals:   01/21/19 1419 01/21/19 1425  BP: 116/63 (!) 110/59  Pulse: 60   Resp: 12   Temp: 36.5 C   SpO2: 97%     Last Pain:  Vitals:   01/21/19 1425  TempSrc:   PainSc: 0-No pain                 Ryan P Ellender

## 2019-01-21 NOTE — Transfer of Care (Signed)
Immediate Anesthesia Transfer of Care Note  Patient: Thomas [name]  Procedure(s) Performed: EXCHANGE OF A DIALYSIS CATHETER, right internal jugular (Right Chest) left arm Bascilic Vein Transposition (Left Arm Upper)  Patient Location: PACU  Anesthesia Type:MAC  Level of Consciousness: drowsy, patient cooperative and responds to stimulation  Airway & Oxygen Therapy: Patient Spontanous Breathing and Patient connected to face mask oxygen  Post-op Assessment: Report given to RN, Post -op Vital signs reviewed and stable and Patient moving all extremities X 4  Post vital signs: Reviewed and stable  Last Vitals:  Vitals Value Taken Time  BP 90/48 01/21/19 1255  Temp    Pulse 49 01/21/19 1256  Resp 15 01/21/19 1256  SpO2 100 % 01/21/19 1256  Vitals shown include unvalidated device data.  Last Pain:  Vitals:   01/21/19 0924  TempSrc:   PainSc: 0-No pain      Patients Stated Pain Goal: 3 (11/03/54 1537)  Complications: No apparent anesthesia complications

## 2019-01-22 ENCOUNTER — Encounter (HOSPITAL_COMMUNITY): Payer: Self-pay | Admitting: Vascular Surgery

## 2019-01-24 ENCOUNTER — Other Ambulatory Visit: Payer: Self-pay

## 2019-01-24 ENCOUNTER — Emergency Department (HOSPITAL_COMMUNITY): Payer: Medicare Other

## 2019-01-24 ENCOUNTER — Inpatient Hospital Stay (HOSPITAL_COMMUNITY)
Admission: EM | Admit: 2019-01-24 | Discharge: 2019-01-30 | DRG: 981 | Disposition: A | Discharge status: Home Health | Payer: Medicare Other | Attending: Internal Medicine | Admitting: Internal Medicine

## 2019-01-24 ENCOUNTER — Encounter (HOSPITAL_COMMUNITY): Payer: Self-pay

## 2019-01-24 DIAGNOSIS — I132 Hypertensive heart and chronic kidney disease with heart failure and with stage 5 chronic kidney disease, or end stage renal disease: Secondary | ICD-10-CM | POA: Diagnosis present

## 2019-01-24 DIAGNOSIS — D631 Anemia in chronic kidney disease: Secondary | ICD-10-CM | POA: Diagnosis present

## 2019-01-24 DIAGNOSIS — E1122 Type 2 diabetes mellitus with diabetic chronic kidney disease: Secondary | ICD-10-CM | POA: Diagnosis present

## 2019-01-24 DIAGNOSIS — Z9981 Dependence on supplemental oxygen: Secondary | ICD-10-CM | POA: Diagnosis not present

## 2019-01-24 DIAGNOSIS — J189 Pneumonia, unspecified organism: Principal | ICD-10-CM | POA: Diagnosis present

## 2019-01-24 DIAGNOSIS — Z794 Long term (current) use of insulin: Secondary | ICD-10-CM | POA: Diagnosis not present

## 2019-01-24 DIAGNOSIS — Z961 Presence of intraocular lens: Secondary | ICD-10-CM | POA: Diagnosis present

## 2019-01-24 DIAGNOSIS — K219 Gastro-esophageal reflux disease without esophagitis: Secondary | ICD-10-CM | POA: Diagnosis present

## 2019-01-24 DIAGNOSIS — E1140 Type 2 diabetes mellitus with diabetic neuropathy, unspecified: Secondary | ICD-10-CM | POA: Diagnosis present

## 2019-01-24 DIAGNOSIS — Z91048 Other nonmedicinal substance allergy status: Secondary | ICD-10-CM

## 2019-01-24 DIAGNOSIS — N186 End stage renal disease: Secondary | ICD-10-CM

## 2019-01-24 DIAGNOSIS — Z6829 Body mass index (BMI) 29.0-29.9, adult: Secondary | ICD-10-CM

## 2019-01-24 DIAGNOSIS — Z8674 Personal history of sudden cardiac arrest: Secondary | ICD-10-CM

## 2019-01-24 DIAGNOSIS — L03313 Cellulitis of chest wall: Secondary | ICD-10-CM | POA: Diagnosis present

## 2019-01-24 DIAGNOSIS — Z833 Family history of diabetes mellitus: Secondary | ICD-10-CM

## 2019-01-24 DIAGNOSIS — Z79899 Other long term (current) drug therapy: Secondary | ICD-10-CM | POA: Diagnosis not present

## 2019-01-24 DIAGNOSIS — I2510 Atherosclerotic heart disease of native coronary artery without angina pectoris: Secondary | ICD-10-CM | POA: Diagnosis present

## 2019-01-24 DIAGNOSIS — I252 Old myocardial infarction: Secondary | ICD-10-CM | POA: Diagnosis not present

## 2019-01-24 DIAGNOSIS — R0902 Hypoxemia: Secondary | ICD-10-CM | POA: Diagnosis present

## 2019-01-24 DIAGNOSIS — Z885 Allergy status to narcotic agent status: Secondary | ICD-10-CM | POA: Diagnosis not present

## 2019-01-24 DIAGNOSIS — I5033 Acute on chronic diastolic (congestive) heart failure: Secondary | ICD-10-CM | POA: Diagnosis present

## 2019-01-24 DIAGNOSIS — Z888 Allergy status to other drugs, medicaments and biological substances status: Secondary | ICD-10-CM

## 2019-01-24 DIAGNOSIS — E7800 Pure hypercholesterolemia, unspecified: Secondary | ICD-10-CM | POA: Diagnosis present

## 2019-01-24 DIAGNOSIS — Z9049 Acquired absence of other specified parts of digestive tract: Secondary | ICD-10-CM

## 2019-01-24 DIAGNOSIS — Z9009 Acquired absence of other part of head and neck: Secondary | ICD-10-CM | POA: Diagnosis not present

## 2019-01-24 DIAGNOSIS — R531 Weakness: Secondary | ICD-10-CM

## 2019-01-24 DIAGNOSIS — E8770 Fluid overload, unspecified: Secondary | ICD-10-CM | POA: Diagnosis present

## 2019-01-24 DIAGNOSIS — Z9842 Cataract extraction status, left eye: Secondary | ICD-10-CM

## 2019-01-24 DIAGNOSIS — Z9115 Patient's noncompliance with renal dialysis: Secondary | ICD-10-CM | POA: Diagnosis not present

## 2019-01-24 DIAGNOSIS — Z9841 Cataract extraction status, right eye: Secondary | ICD-10-CM

## 2019-01-24 DIAGNOSIS — Z87891 Personal history of nicotine dependence: Secondary | ICD-10-CM

## 2019-01-24 DIAGNOSIS — R509 Fever, unspecified: Secondary | ICD-10-CM | POA: Diagnosis not present

## 2019-01-24 DIAGNOSIS — E669 Obesity, unspecified: Secondary | ICD-10-CM | POA: Diagnosis present

## 2019-01-24 DIAGNOSIS — Z20828 Contact with and (suspected) exposure to other viral communicable diseases: Secondary | ICD-10-CM | POA: Diagnosis present

## 2019-01-24 DIAGNOSIS — G253 Myoclonus: Secondary | ICD-10-CM | POA: Diagnosis present

## 2019-01-24 DIAGNOSIS — E039 Hypothyroidism, unspecified: Secondary | ICD-10-CM | POA: Diagnosis present

## 2019-01-24 DIAGNOSIS — Z992 Dependence on renal dialysis: Secondary | ICD-10-CM

## 2019-01-24 DIAGNOSIS — I509 Heart failure, unspecified: Secondary | ICD-10-CM

## 2019-01-24 DIAGNOSIS — Z79891 Long term (current) use of opiate analgesic: Secondary | ICD-10-CM | POA: Diagnosis not present

## 2019-01-24 DIAGNOSIS — J449 Chronic obstructive pulmonary disease, unspecified: Secondary | ICD-10-CM | POA: Diagnosis present

## 2019-01-24 DIAGNOSIS — R197 Diarrhea, unspecified: Secondary | ICD-10-CM | POA: Diagnosis not present

## 2019-01-24 DIAGNOSIS — Z85828 Personal history of other malignant neoplasm of skin: Secondary | ICD-10-CM

## 2019-01-24 DIAGNOSIS — E785 Hyperlipidemia, unspecified: Secondary | ICD-10-CM | POA: Diagnosis present

## 2019-01-24 DIAGNOSIS — I120 Hypertensive chronic kidney disease with stage 5 chronic kidney disease or end stage renal disease: Secondary | ICD-10-CM | POA: Diagnosis not present

## 2019-01-24 DIAGNOSIS — S40022A Contusion of left upper arm, initial encounter: Secondary | ICD-10-CM | POA: Diagnosis present

## 2019-01-24 DIAGNOSIS — Z955 Presence of coronary angioplasty implant and graft: Secondary | ICD-10-CM

## 2019-01-24 DIAGNOSIS — E872 Acidosis: Secondary | ICD-10-CM | POA: Diagnosis not present

## 2019-01-24 LAB — COMPREHENSIVE METABOLIC PANEL
ALT: 8 U/L (ref 0–44)
AST: 42 U/L — ABNORMAL HIGH (ref 15–41)
Albumin: 2.8 g/dL — ABNORMAL LOW (ref 3.5–5.0)
Alkaline Phosphatase: 89 U/L (ref 38–126)
Anion gap: 23 — ABNORMAL HIGH (ref 5–15)
BUN: 66 mg/dL — ABNORMAL HIGH (ref 8–23)
CO2: 19 mmol/L — ABNORMAL LOW (ref 22–32)
Calcium: 7.2 mg/dL — ABNORMAL LOW (ref 8.9–10.3)
Chloride: 94 mmol/L — ABNORMAL LOW (ref 98–111)
Creatinine, Ser: 12.21 mg/dL — ABNORMAL HIGH (ref 0.61–1.24)
GFR calc Af Amer: 4 mL/min — ABNORMAL LOW (ref >60)
GFR calc non Af Amer: 3 mL/min — ABNORMAL LOW (ref >60)
Glucose, Bld: 131 mg/dL — ABNORMAL HIGH (ref 70–99)
Potassium: 3.9 mmol/L (ref 3.5–5.1)
Sodium: 136 mmol/L (ref 135–145)
Total Bilirubin: 0.5 mg/dL (ref 0.3–1.2)
Total Protein: 6.5 g/dL (ref 6.5–8.1)

## 2019-01-24 LAB — CBC WITH DIFFERENTIAL/PLATELET
Abs Immature Granulocytes: 0.14 10*3/uL — ABNORMAL HIGH (ref 0.00–0.07)
Basophils Absolute: 0.1 10*3/uL (ref 0.0–0.1)
Basophils Relative: 1 %
Eosinophils Absolute: 0.6 10*3/uL — ABNORMAL HIGH (ref 0.0–0.5)
Eosinophils Relative: 4 %
HCT: 29.2 % — ABNORMAL LOW (ref 39.0–52.0)
Hemoglobin: 9.2 g/dL — ABNORMAL LOW (ref 13.0–17.0)
Immature Granulocytes: 1 %
Lymphocytes Relative: 8 %
Lymphs Abs: 1 10*3/uL (ref 0.7–4.0)
MCH: 28.7 pg (ref 26.0–34.0)
MCHC: 31.5 g/dL (ref 30.0–36.0)
MCV: 91 fL (ref 80.0–100.0)
Monocytes Absolute: 0.8 10*3/uL (ref 0.1–1.0)
Monocytes Relative: 6 %
Neutro Abs: 10.5 10*3/uL — ABNORMAL HIGH (ref 1.7–7.7)
Neutrophils Relative %: 80 %
Platelets: 276 10*3/uL (ref 150–400)
RBC: 3.21 MIL/uL — ABNORMAL LOW (ref 4.22–5.81)
RDW: 16.8 % — ABNORMAL HIGH (ref 11.5–15.5)
WBC: 13.1 10*3/uL — ABNORMAL HIGH (ref 4.0–10.5)
nRBC: 0 % (ref 0.0–0.2)

## 2019-01-24 LAB — HEMOGLOBIN A1C
Hgb A1c MFr Bld: 7.9 % — ABNORMAL HIGH (ref 4.8–5.6)
Mean Plasma Glucose: 180.03 mg/dL

## 2019-01-24 LAB — GLUCOSE, CAPILLARY: Glucose-Capillary: 116 mg/dL — ABNORMAL HIGH (ref 70–99)

## 2019-01-24 LAB — CBG MONITORING, ED: Glucose-Capillary: 125 mg/dL — ABNORMAL HIGH (ref 70–99)

## 2019-01-24 LAB — LACTIC ACID, PLASMA: Lactic Acid, Venous: 0.9 mmol/L (ref 0.5–1.9)

## 2019-01-24 MED ORDER — VANCOMYCIN HCL IN DEXTROSE 1-5 GM/200ML-% IV SOLN: 1000.0000 mg | Freq: Once | INTRAVENOUS | Status: DC

## 2019-01-24 MED ORDER — INSULIN ASPART 100 UNIT/ML ~~LOC~~ SOLN: 0.0000 [IU] | Freq: Every day | SUBCUTANEOUS | Status: DC

## 2019-01-24 MED ORDER — INSULIN ASPART 100 UNIT/ML ~~LOC~~ SOLN
0.0000 [IU] | Freq: Three times a day (TID) | SUBCUTANEOUS | Status: DC
  Administered 2019-01-25: 3 [IU] via SUBCUTANEOUS
  Administered 2019-01-25 (×2): 2 [IU] via SUBCUTANEOUS
  Administered 2019-01-26: 3 [IU] via SUBCUTANEOUS
  Administered 2019-01-26: 13:00:00 2 [IU] via SUBCUTANEOUS
  Administered 2019-01-27 (×3): 3 [IU] via SUBCUTANEOUS
  Administered 2019-01-28 – 2019-01-29 (×5): 2 [IU] via SUBCUTANEOUS
  Administered 2019-01-30: 3 [IU] via SUBCUTANEOUS
  Administered 2019-01-30: 07:00:00 2 [IU] via SUBCUTANEOUS

## 2019-01-24 MED ORDER — METOPROLOL TARTRATE 25 MG PO TABS
25.0000 mg | ORAL_TABLET | Freq: Two times a day (BID) | ORAL | Status: DC
  Administered 2019-01-24 – 2019-01-30 (×10): 25 mg via ORAL
  Filled 2019-01-24 (×11): qty 1

## 2019-01-24 MED ORDER — INSULIN ASPART 100 UNIT/ML ~~LOC~~ SOLN: 4.0000 [IU] | Freq: Three times a day (TID) | SUBCUTANEOUS | Status: DC

## 2019-01-24 MED ORDER — IPRATROPIUM-ALBUTEROL 20-100 MCG/ACT IN AERS
2.0000 | INHALATION_SPRAY | Freq: Two times a day (BID) | RESPIRATORY_TRACT | Status: DC
  Filled 2019-01-24: qty 4

## 2019-01-24 MED ORDER — INSULIN GLARGINE 100 UNIT/ML ~~LOC~~ SOLN
10.0000 [IU] | Freq: Every day | SUBCUTANEOUS | Status: DC
  Administered 2019-01-24 – 2019-01-29 (×6): 10 [IU] via SUBCUTANEOUS
  Filled 2019-01-24 (×7): qty 0.1

## 2019-01-24 MED ORDER — ACETAMINOPHEN 650 MG RE SUPP: 650.0000 mg | Freq: Four times a day (QID) | RECTAL | Status: DC | PRN

## 2019-01-24 MED ORDER — SODIUM CHLORIDE 0.9 % IV SOLN
2.0000 g | Freq: Once | INTRAVENOUS | Status: AC
  Administered 2019-01-24: 2 g via INTRAVENOUS
  Filled 2019-01-24: qty 20

## 2019-01-24 MED ORDER — VANCOMYCIN HCL IN DEXTROSE 1-5 GM/200ML-% IV SOLN
1000.0000 mg | INTRAVENOUS | Status: DC
  Filled 2019-01-24: qty 200

## 2019-01-24 MED ORDER — DIPHENHYDRAMINE HCL 25 MG PO CAPS
25.0000 mg | ORAL_CAPSULE | Freq: Two times a day (BID) | ORAL | Status: DC | PRN
  Administered 2019-01-24: 25 mg via ORAL
  Filled 2019-01-24: qty 1

## 2019-01-24 MED ORDER — ACETAMINOPHEN 325 MG PO TABS
650.0000 mg | ORAL_TABLET | Freq: Four times a day (QID) | ORAL | Status: DC | PRN
  Filled 2019-01-24: qty 2

## 2019-01-24 MED ORDER — CHLORHEXIDINE GLUCONATE CLOTH 2 % EX PADS
6.0000 | MEDICATED_PAD | Freq: Every day | CUTANEOUS | Status: DC
  Administered 2019-01-25: 6 via TOPICAL

## 2019-01-24 MED ORDER — GABAPENTIN 100 MG PO CAPS
100.0000 mg | ORAL_CAPSULE | Freq: Two times a day (BID) | ORAL | Status: DC | PRN
  Administered 2019-01-24: 18:00:00 100 mg via ORAL
  Filled 2019-01-24: qty 1

## 2019-01-24 MED ORDER — METOPROLOL TARTRATE 25 MG PO TABS: 100.0000 mg | ORAL_TABLET | Freq: Two times a day (BID) | ORAL | Status: DC

## 2019-01-24 MED ORDER — CALCIUM ACETATE (PHOS BINDER) 667 MG PO CAPS: 667.0000 mg | ORAL_CAPSULE | Freq: Three times a day (TID) | ORAL | Status: DC | PRN

## 2019-01-24 MED ORDER — VANCOMYCIN HCL 10 G IV SOLR
2000.0000 mg | Freq: Once | INTRAVENOUS | Status: AC
  Administered 2019-01-24: 16:00:00 2000 mg via INTRAVENOUS
  Filled 2019-01-24: qty 2000

## 2019-01-24 MED ORDER — HEPARIN SODIUM (PORCINE) 5000 UNIT/ML IJ SOLN
5000.0000 [IU] | Freq: Three times a day (TID) | INTRAMUSCULAR | Status: DC
  Administered 2019-01-24 – 2019-01-30 (×17): 5000 [IU] via SUBCUTANEOUS
  Filled 2019-01-24 (×17): qty 1

## 2019-01-24 MED ORDER — CALCIUM ACETATE (PHOS BINDER) 667 MG PO CAPS
1334.0000 mg | ORAL_CAPSULE | Freq: Three times a day (TID) | ORAL | Status: DC
  Administered 2019-01-25 – 2019-01-29 (×6): 1334 mg via ORAL
  Filled 2019-01-24 (×9): qty 2

## 2019-01-24 NOTE — Consult Note (Signed)
Hospital Consult    Reason for Consult: Concern for right chest wall cellulitis after exchange of TDC also to evaluate left arm AV fistula Referring Physician: ED MRN #:  [id number]  History of Present Illness: This is a 78 y.o. male with multiple medical problems including COPD, CAD, DM, nose cancer, HTN, HLD, and end-stage renal disease that presents from home with confusion, global weakness, and fevers as well as hypoxia.  Most of the history is obtained from the patient's wife who is at bedside.  She states that he dialyzed for about half a session on Thursday.  Ultimately had fairly profound globalized weakness afterwards.  He also complained of some pain in his left arm at the incision of his fistula.  On 01/21/2019 patient underwent exchange of right IJ dialysis catheter since the previous catheter was not working as well as placement of a new left arm basilic vein fistula by Dr. Donzetta Matters.  In regards to his suspected chest wall cellulitis the wife states this has been there for some time.  She states that he has a tape allergy and every time the dialysis center uses paper tape at dialysis his chest breaks out.  He had this rash on his chest wall even prior to his recent catheter exchange according to the wife and no worse.  He also complains of some numbness in his left first through third fingers that is been present since his initial radiocephalic fistula was placed.  He feels this is tolerable at this time.  Past Medical History:  Diagnosis Date  . Anemia   . Arthritis    hands, back  . Cancer (HCC)    nose  . Cardiac arrest ([place])   . Cataract   . CHF (congestive heart failure) ([place])   . COPD (chronic obstructive pulmonary disease) ([place])   . Coronary artery disease   . Diabetes mellitus ([place])    Type II  . Diverticulosis 2013   tcs by RMR  . Diverticulosis   . Erosive esophagitis   . ESRD on hemodialysis (HCC)    CKD, hemodialysis Tues/Thurs/Sat  . Family history of adverse  reaction to anesthesia    Daughter slow to awaken and extreme nausea  . GERD (gastroesophageal reflux disease)   . Headache   . Hiatal hernia    pt not aware of this  . HTN (hypertension)   . Hypercholesterolemia   . Hypothyroidism   . Irritable bowel syndrome (IBS)   . Jerking    hands or feet- 01/2019- has decreased  . Lymphocytic colitis 2013   tcs by RMR  . Myocardial infarction ([place])    X2  . Nasal mass   . Neuropathy   . Pneumonia   . Schatzki's ring   . SDH (subdural hematoma) ([place])    02/2018, conservative management  . Shortness of breath   . Sleep apnea    uses O2 only  . Supplemental oxygen dependent    At HS and PRN during the day  . Tobacco abuse   . Wears dentures   . Wears glasses     Past Surgical History:  Procedure Laterality Date  . AV FISTULA PLACEMENT Left 06/29/2013   Procedure: LEFT RADIAL/CEPHALIC FISTULA;  Surgeon: Conrad New Bedford, MD;  Location: [place];  Service: Vascular;  Laterality: Left;  . BASCILIC VEIN TRANSPOSITION Left 01/21/2019   Procedure: left arm Bascilic Vein Transposition;  Surgeon: Waynetta Sandy, MD;  Location: [facility];  Service: Vascular;  Laterality: Left;  .  cardiac stents     5  . CATARACT EXTRACTION W/PHACO Left 05/17/2014   Procedure: CATARACT EXTRACTION PHACO AND INTRAOCULAR LENS PLACEMENT (IOC);  Surgeon: Tonny Branch, MD;  Location: [facility];  Service: Ophthalmology;  Laterality: Left;  CDE:7.71  . CATARACT EXTRACTION W/PHACO Right 06/14/2014   Procedure: CATARACT EXTRACTION PHACO AND INTRAOCULAR LENS PLACEMENT RIGHT EYE;  Surgeon: Tonny Branch, MD;  Location: [facility];  Service: Ophthalmology;  Laterality: Right;  CDE:8.22  . CHOLECYSTECTOMY    . COLONOSCOPY  December 2007   adenomatous polyps, sigmoid diverticula  . COLONOSCOPY  02/06/2012   Dr. Gala Romney- colonic diverticulosis, bx consistent with lymphocytic colitis  . CORONARY ANGIOPLASTY     x5 stents  . ESOPHAGOGASTRODUODENOSCOPY  May 2009   RMR: non-critical Schatzki's  ring, sp dilation with 23 F Maloney, small inlet patch, mild erosive relufx esophagitis  . ESOPHAGOGASTRODUODENOSCOPY N/A 07/15/2012   Dr. Gala Romney- hiatal hernia, duodenal ulcer with surrounding erosions. this may well be related to the "haziness" sen around the head of the pancreas and duodenum on recent noncontrast ct scan and may have much to do with his abdominal pain.  Marland Kitchen EXCHANGE OF A DIALYSIS CATHETER Right 01/21/2019   Procedure: EXCHANGE OF A DIALYSIS CATHETER, right internal jugular;  Surgeon: Waynetta Sandy, MD;  Location: [facility];  Service: Vascular;  Laterality: Right;  . EXCISION NASAL MASS N/A 07/18/2015   Procedure: EXCISION OF NASAL MASS;  Surgeon: Leta Baptist, MD;  Location: [facility];  Service: ENT;  Laterality: N/A;  . INSERTION OF DIALYSIS CATHETER Right 05/21/2013   Procedure: INSERTION OF DIALYSIS CATHETER;  Surgeon: Conrad Birchwood, MD;  Location: [place] [facility];  Service: Vascular;  Laterality: Right;  . IR DIALY SHUNT INTRO [place] W/IMG LEFT Left 10/29/2018  . IR FLUORO GUIDE CV LINE RIGHT  11/05/2018  . IR US GUIDE VASC ACCESS LEFT  10/29/2018  . IR US GUIDE VASC ACCESS RIGHT  11/05/2018  . LIGATION OF COMPETING BRANCHES OF ARTERIOVENOUS FISTULA Left 11/27/2018   Procedure: Ligation Of Competing Branches Of Arteriovenous Fistula;  Surgeon: [name], MD;  Location[facility];  Service: Vascular;  Laterality: Left;  Marland Kitchen MULTIPLE TOOTH EXTRACTIONS    . REVISION OF ARTERIOVENOUS GORETEX GRAFT Left 11/27/2018   Procedure: Revision of Left Arm Arteriovenious Fistula;  Surgeon: [name], MD;  Location: [facility];  Service: Vascular;  Laterality: Left;  . THROMBECTOMY W/ EMBOLECTOMY Left 11/27/2018   Procedure: Thrombectomy Arteriovenous Fistula;  Surgeon: [name], MD;  Location: [facility];  Service: Vascular;  Laterality: Left;  Marland Kitchen VASECTOMY      Allergies  Allergen Reactions  . Ace Inhibitors Anaphylaxis    Swelling in throat and neck  . Statins Anaphylaxis and  Swelling    Caused feet, tongue and throat to swell.  . Codeine Other (See Comments)    Muscle Spasms    Prior to Admission medications   Medication Sig Start Date End Date Taking? Authorizing Provider  acetaminophen (TYLENOL) 650 MG CR tablet Take 1,300 mg by mouth every 8 (eight) hours as needed for pain.    Yes [provider]  albuterol (PROVENTIL,VENTOLIN) 2 MG/5ML syrup Take 2 mg by mouth 2 (two) times a day.    Yes [provider]  albuterol (VENTOLIN HFA) 108 (90 Base) MCG/ACT inhaler Inhale 1-2 puffs into the lungs every 4 (four) hours as needed for wheezing or shortness of breath.   Yes [provider]  ALPRAZolam Duanne Moron) 1 MG tablet  Take 1 mg by mouth at bedtime as needed for sleep.  07/30/18  Yes [provider]  amLODipine (NORVASC) 10 MG tablet Take 10 mg by mouth daily at 12 noon.    Yes [provider]  b complex-vitamin c-folic acid (NEPHRO-VITE) 0.8 MG TABS tablet Take 1 tablet by mouth daily.   Yes [provider]  butalbital-acetaminophen-caffeine (FIORICET, ESGIC) 50-325-40 MG per tablet Take 1 tablet by mouth every 6 (six) hours as needed for headache.    Yes [provider]  calcium acetate (PHOSLO) 667 MG capsule Take 667-1,334 mg by mouth See admin instructions. Take 1334 mg with each meal and 667 mg with each snack   Yes [provider]  Calcium Carb-Cholecalciferol (CALCIUM + D3 PO) Take 1 tablet by mouth daily.    Yes [provider]  Cholecalciferol (VITAMIN D3) 50 MCG (2000 UT) capsule Take 4,000 Units by mouth 2 (two) times daily.    Yes [provider]  dicyclomine (BENTYL) 20 MG tablet Take 20 mg by mouth 4 (four) times daily as needed for spasms.    Yes [provider]  diphenhydrAMINE (BENADRYL) 25 MG tablet Take 50 mg by mouth 3 (three) times daily as needed for itching.   Yes [provider]  gabapentin (NEURONTIN) 300 MG capsule Take 300 mg by mouth See  admin instructions. Take 300 mg daily, may take an additional 300 mg dose up to 2 times daily as needed for pain   Yes [provider]  hydrochlorothiazide (HYDRODIURIL) 25 MG tablet Take 25 mg by mouth daily.   Yes [provider]  insulin lispro (HUMALOG) 100 UNIT/ML injection Inject 10 Units into the skin 3 (three) times daily with meals.    Yes [provider]  isosorbide mononitrate (IMDUR) 30 MG 24 hr tablet Take 30 mg by mouth daily.  03/03/12  Yes [provider]  LANTUS SOLOSTAR 100 UNIT/ML injection Inject 45-65 Units into the skin at bedtime. According to sugar level.   Yes [provider]  lidocaine-prilocaine (EMLA) cream Apply 1 application topically as needed (port access).   Yes [provider]  loperamide (IMODIUM) 1 MG/5ML solution Take 1-2 mg by mouth as needed for diarrhea or loose stools.   Yes [provider]  losartan (COZAAR) 100 MG tablet Take 100 mg by mouth daily.  02/19/18  Yes [provider]  magnesium oxide (MAG-OX) 400 MG tablet Take 400 mg by mouth at bedtime.    Yes [provider]  metoprolol (LOPRESSOR) 100 MG tablet Take 100 mg by mouth 2 (two) times daily.  12/07/11  Yes [provider]  Naphazoline-Glycerin (REDNESS RELIEF OP) Place 1 drop into both eyes daily as needed (redness/ dryness).   Yes [provider]  NITROSTAT 0.4 MG SL tablet Place 0.4 mg under the tongue every 5 (five) minutes as needed for chest pain.  06/01/13  Yes [provider]  Omega-3 Fatty Acids (FISH OIL) 1000 MG CAPS Take 2,000 mg by mouth 2 (two) times a day.   Yes [provider]  omeprazole (PRILOSEC) 20 MG capsule Take 20 mg by mouth 2 (two) times daily before a meal.   Yes [provider]  ondansetron (ZOFRAN) 4 MG tablet Take 4 mg by mouth every 8 (eight) hours as needed for nausea or vomiting.   Yes [provider]  oxyCODONE-acetaminophen  (PERCOCET/ROXICET) 5-325 MG tablet Take 1 tablet by mouth every 6 (six) hours as needed. 01/21/19  Yes Laurence Slate M, PA-C  primidone (MYSOLINE) 50 MG tablet Take 50 mg by mouth at bedtime. 06/17/18  Yes [provider]  STIOLTO RESPIMAT 2.5-2.5 MCG/ACT AERS Inhale 2 puffs into the lungs at bedtime. 09/24/18  Yes [provider]    Social History   Socioeconomic History  . Marital status: Married    Spouse name: Not on file  . Number of children: 3  . Years of education: Not on file  . Highest education level: Not on file  Occupational History  . 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 Financial resource strain: Not on file  . Food insecurity    Worry: Not on file    Inability: Not on file  . Transportation needs    Medical: Not on file    Non-medical: Not on file  Tobacco Use  . Smoking status: Former Smoker    Packs/day: 0.00    Years: 60.00    Pack years: 0.00    Types: E-cigarettes, Cigarettes    Quit date: 11/10/2014    Years since quitting: 4.2  . Smokeless tobacco: Never Used  Substance and Sexual Activity  . Alcohol use: No  . Drug use: No  . Sexual activity: Not on file  Lifestyle  . Physical activity    Days per week: Not on file    Minutes per session: Not on file  . Stress: Not on file  Relationships  . Social Herbalist on phone: Not on file    Gets together: Not on file    Attends religious service: Not on file    Active member of club or organization: Not on file    Attends meetings of clubs or organizations: Not on file    Relationship status: Not on file  . Intimate partner violence    Fear of current or ex partner: Not on file    Emotionally abused: Not on file    Physically abused: Not on file    Forced sexual activity: Not on file  Other Topics Concern  . Not on file  Social History Narrative  . Not on file     Family History  Problem Relation Age of Onset  . Cancer Mother   . Cancer Father    . Diabetes Brother   . Cancer Brother   . Diabetes Sister   . Cancer Sister   . Cancer Sister   . Colon cancer Neg Hx     ROS: [x]  Positive   [ ]  Negative   [ ]  All sytems reviewed and are negative  Cardiovascular: []  chest pain/pressure []  palpitations [x]  SOB lying flat []  DOE []  pain in legs while walking []  pain in legs at rest []  pain in legs at night []  non-healing ulcers []  hx of DVT []  swelling in legs  Pulmonary: []  productive cough []  asthma/wheezing []  home O2  Neurologic: []  weakness in []  arms []  legs x] numbness in [x]  arms []  legs (left fingers 1-3) []  hx of CVA []  mini stroke [] difficulty speaking or slurred speech []  temporary loss of vision in one eye []  dizziness  Hematologic: []  hx of cancer []  bleeding problems []  problems with blood clotting easily  Endocrine:   []  diabetes []  thyroid disease  GI []  vomiting blood []  blood in stool  GU: []  CKD/renal failure []  HD--[]  M/W/F or []  T/T/S []  burning with urination []  blood in urine  Psychiatric: []  anxiety []  depression  Musculoskeletal: []   arthritis []  joint pain  Integumentary: []  rashes []  ulcers  Constitutional: []  fever []  chills   Physical Examination  Vitals:   01/24/19 1600 01/24/19 1615  BP: (!) 116/58 (!) 112/57  Pulse: 69 72  Resp: 13 14  Temp:    SpO2: 100% 100%   Body mass index is 30.68 kg/m.  General:  Non-rebreather mask, laying in bed in ED Gait: Not observed HENT: WNL, normocephalic Pulmonary: appears to have some increased WOB Cardiac: regular, without  Murmurs, rubs or gallops Vascular Exam/Pulses: Right IJ tunneled dialysis catheter in place.  No purulent drainage.  He has large rash on his right chest wall that is nonblanching and not warm. Left brachiobasilic AV fistula with appreciable thrill.  No palpable pulse at the wrist.  Fair amount of bruising and ecchymosis along the incisions in the upper arm.  No large hematoma.  Musculoskeletal: no muscle wasting or atrophy Neurologic: A&O X 3; Appropriate Affect ; SENSATION: normal; MOTOR FUNCTION:  moving all extremities equally. Speech is fluent/normal   CBC    Component Value Date/Time   WBC 13.1 (H) 01/24/2019 1443   RBC 3.21 (L) 01/24/2019 1443   HGB 9.2 (L) 01/24/2019 1443   HCT 29.2 (L) 01/24/2019 1443   HCT 32 06/29/2012 1308   PLT 276 01/24/2019 1443   MCV 91.0 01/24/2019 1443   MCH 28.7 01/24/2019 1443   MCHC 31.5 01/24/2019 1443   RDW 16.8 (H) 01/24/2019 1443   LYMPHSABS 1.0 01/24/2019 1443   MONOABS 0.8 01/24/2019 1443   EOSABS 0.6 (H) 01/24/2019 1443   BASOSABS 0.1 01/24/2019 1443    BMET    Component Value Date/Time   NA 136 01/24/2019 1443   NA 137 06/30/2012 1305   K 3.9 01/24/2019 1443   K 4.1 06/30/2012 1305   CL 94 (L) 01/24/2019 1443   CL 106 06/30/2012 1305   CO2 19 (L) 01/24/2019 1443   CO2 24 06/30/2012 1305   GLUCOSE 131 (H) 01/24/2019 1443   BUN 66 (H) 01/24/2019 1443   BUN 62 (A) 06/30/2012 1305   CREATININE 12.21 (H) 01/24/2019 1443   CREATININE 4.09 (H) 07/23/2012 0941   CALCIUM 7.2 (L) 01/24/2019 1443   CALCIUM 8.1 06/30/2012 1305   GFRNONAA 3 (L) 01/24/2019 1443   GFRAA 4 (L) 01/24/2019 1443    COAGS: Lab Results  Component Value Date   INR 1.1 01/21/2019   INR 1.0 11/05/2018   INR 1.0 10/29/2018     Non-Invasive Vascular Imaging:    None   ASSESSMENT/PLAN:  78 y.o. male with multiple medical problems including COPD, CAD, DM, nose cancer, HTN, HLD, and end-stage renal disease that presents from home with confusion, global weakness, and fevers as well as hypoxia.  Underwent RIJ catheter exchange earlier in the week and left arm basilic vein fistula placement.  On exam I do not think is chest wall rash is consistent with cellulitis.  This is nonblanching and is not warm.  The wife states this has been present for some time even before his recent dialysis catheter exchange and is related to his tape  allergy.  She states his chest wall is always broken out whenever he has a tunneled catheter in place because of the tape applied in dialysis.  Certainly if further work-up with blood cultures etc. raises concern for catheter infection we can remove this and give him a line holiday.  His left arm brachiobasilic fistula has a good thrill.  He has some chronic steal  symptoms in his left hand that are tolerable at this time with numbness in his first 3 fingers.  Fair amount of bruising and ecchymosis to his left arm but no large hematoma that needs surgical intervention at this time.  Vascular surgery will continue to follow.  Marty Heck, MD [facility] [place] Office: [phone] Pager: [phone]

## 2019-01-24 NOTE — ED Provider Notes (Addendum)
[facility] EMERGENCY DEPARTMENT Provider Note   CSN: [id number] Arrival date & time: 01/24/19  1333     History   Chief Complaint Chief Complaint  Patient presents with  . Weakness    HPI Thomas [name] is a 78 y.o. male.     Patient with hx ESRD/HD M/W/F, and pod # 4 R IJ tunneled HD cath and LUE basilac vein fistula creation, presents with generalized weakness, fever 101, and too weak to go to dialysis for past 2 days. Patient c/o pain to LUE at/around surgical site since the time of surgery - symptoms moderate, persistent, slowly worse. Pt unsure whether increased bruising, erythema and swelling to area. No arm numbness. Pt is also notes to have erythema about right upper chest around catheter placement. No purulent drainage from site. Denies headache. No neck pain or stiffness. No cough or sore throat. +sob and hypoxic per EMS. No chest pain or discomfort. No abd pain or nvd. No dysuria. Pt poor historian - level 5 caveat. No known covid + exposure.   The history is provided by the patient and the EMS personnel. The history is limited by the condition of the patient.  Weakness Associated symptoms: fever   Associated symptoms: no abdominal pain, no chest pain, no cough, no diarrhea, no dysuria, no headaches, no shortness of breath and no vomiting     Past Medical History:  Diagnosis Date  . Anemia   . Arthritis    hands, back  . Cancer (HCC)    nose  . Cardiac arrest ([place])   . Cataract   . CHF (congestive heart failure) ([place])   . COPD (chronic obstructive pulmonary disease) ([place])   . Coronary artery disease   . Diabetes mellitus ([place])    Type II  . Diverticulosis 2013   tcs by RMR  . Diverticulosis   . Erosive esophagitis   . ESRD on hemodialysis (HCC)    CKD, hemodialysis Tues/Thurs/Sat  . Family history of adverse reaction to anesthesia    Daughter slow to awaken and extreme nausea  . GERD (gastroesophageal reflux disease)   . Headache   .  Hiatal hernia    pt not aware of this  . HTN (hypertension)   . Hypercholesterolemia   . Hypothyroidism   . Irritable bowel syndrome (IBS)   . Jerking    hands or feet- 01/2019- has decreased  . Lymphocytic colitis 2013   tcs by RMR  . Myocardial infarction ([place])    X2  . Nasal mass   . Neuropathy   . Pneumonia   . Schatzki's ring   . SDH (subdural hematoma) ([place])    02/2018, conservative management  . Shortness of breath   . Sleep apnea    uses O2 only  . Supplemental oxygen dependent    At HS and PRN during the day  . Tobacco abuse   . Wears dentures   . Wears glasses     Patient Active Problem List   Diagnosis Date Noted  . Congestive heart failure ([place]) 05/11/2016  . Venous stenosis of left upper extremity 05/11/2016  . End stage renal disease ([place]) 06/19/2013  . Rectal bleeding 11/27/2012  . Upper abdominal pain 07/07/2012  . Anemia 07/07/2012  . Chronic nausea 07/07/2012  . Microscopic colitis 03/07/2012  . DIARRHEA 02/01/2010  . DIVERTICULITIS, HX OF 02/01/2010  . CONSTIPATION 09/10/2008  . ABDOMINAL PAIN, LOWER 09/10/2008  . DIABETES MELLITUS, TYPE II 09/09/2008  . CIGARETTE SMOKER  09/09/2008  . CORONARY ARTERY DISEASE 09/09/2008  . 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 Esophageal reflux 09/09/2008  . HIATAL HERNIA 09/09/2008  . HOARSENESS 09/09/2008    Past Surgical History:  Procedure Laterality Date  . AV FISTULA PLACEMENT Left 06/29/2013   Procedure: LEFT RADIAL/CEPHALIC FISTULA;  Surgeon: Conrad Hurt, MD;  Location: [facility];  Service: Vascular;  Laterality: Left;  . BASCILIC VEIN TRANSPOSITION Left 01/21/2019   Procedure: left arm Bascilic Vein Transposition;  Surgeon: Waynetta Sandy, MD;  Location: [facility];  Service: Vascular;  Laterality: Left;  . cardiac stents     5  . CATARACT EXTRACTION W/PHACO Left 05/17/2014   Procedure: CATARACT EXTRACTION PHACO AND INTRAOCULAR LENS PLACEMENT (IOC);  Surgeon: Tonny Branch, MD;  Location: [facility];  Service:  Ophthalmology;  Laterality: Left;  CDE:7.71  . CATARACT EXTRACTION W/PHACO Right 06/14/2014   Procedure: CATARACT EXTRACTION PHACO AND INTRAOCULAR LENS PLACEMENT RIGHT EYE;  Surgeon: Tonny Branch, MD;  Location: [facility];  Service: Ophthalmology;  Laterality: Right;  CDE:8.22  . CHOLECYSTECTOMY    . COLONOSCOPY  December 2007   adenomatous polyps, sigmoid diverticula  . COLONOSCOPY  02/06/2012   Dr. Gala Romney- colonic diverticulosis, bx consistent with lymphocytic colitis  . CORONARY ANGIOPLASTY     x5 stents  . ESOPHAGOGASTRODUODENOSCOPY  May 2009   RMR: non-critical Schatzki's ring, sp dilation with 65 F [place], small inlet patch, mild erosive relufx esophagitis  . ESOPHAGOGASTRODUODENOSCOPY N/A 07/15/2012   Dr. Gala Romney- hiatal hernia, duodenal ulcer with surrounding erosions. this may well be related to the "haziness" sen around the head of the pancreas and duodenum on recent noncontrast ct scan and may have much to do with his abdominal pain.  Marland Kitchen EXCHANGE OF A DIALYSIS CATHETER Right 01/21/2019   Procedure: EXCHANGE OF A DIALYSIS CATHETER, right internal jugular;  Surgeon: Waynetta Sandy, MD;  Location: [facility];  Service: Vascular;  Laterality: Right;  . EXCISION NASAL MASS N/A 07/18/2015   Procedure: EXCISION OF NASAL MASS;  Surgeon: Leta Baptist, MD;  Location: [facility];  Service: ENT;  Laterality: N/A;  . INSERTION OF DIALYSIS CATHETER Right 05/21/2013   Procedure: INSERTION OF DIALYSIS CATHETER;  Surgeon: Conrad Soquel, MD;  Location: [facility];  Service: Vascular;  Laterality: Right;  . IR DIALY SHUNT INTRO [place] W/IMG LEFT Left 10/29/2018  . IR FLUORO GUIDE CV LINE RIGHT  11/05/2018  . IR US GUIDE VASC ACCESS LEFT  10/29/2018  . IR US GUIDE VASC ACCESS RIGHT  11/05/2018  . LIGATION OF COMPETING BRANCHES OF ARTERIOVENOUS FISTULA Left 11/27/2018   Procedure: Ligation Of Competing Branches Of Arteriovenous Fistula;  Surgeon: [name], MD;  Location: [facility] OR;  Service: Vascular;   Laterality: Left;  Marland Kitchen MULTIPLE TOOTH EXTRACTIONS    . REVISION OF ARTERIOVENOUS GORETEX GRAFT Left 11/27/2018   Procedure: Revision of Left Arm Arteriovenious Fistula;  Surgeon: [name], MD;  Location: [facility] OR;  Service: Vascular;  Laterality: Left;  . THROMBECTOMY W/ EMBOLECTOMY Left 11/27/2018   Procedure: Thrombectomy Arteriovenous Fistula;  Surgeon: [name], MD;  Location: [facility];  Service: Vascular;  Laterality: Left;  Marland Kitchen VASECTOMY          Home Medications    Prior to Admission medications   Medication Sig Start Date End Date Taking? Authorizing Provider  acetaminophen (TYLENOL) 650 MG CR tablet Take 1,300 mg by mouth every 8 (eight) hours as needed for pain.     [provider]  albuterol (  PROVENTIL,VENTOLIN) 2 MG/5ML syrup Take 2 mg by mouth 2 (two) times a day.     [provider]  albuterol (VENTOLIN HFA) 108 (90 Base) MCG/ACT inhaler Inhale 1-2 puffs into the lungs every 4 (four) hours as needed for wheezing or shortness of breath.    [provider]  ALPRAZolam Duanne Moron) 1 MG tablet Take 1 mg by mouth at bedtime as needed for sleep.  07/30/18   [provider]  amLODipine (NORVASC) 10 MG tablet Take 10 mg by mouth daily at 12 noon.     [provider]  b complex-vitamin c-folic acid (NEPHRO-VITE) 0.8 MG TABS tablet Take 1 tablet by mouth daily.    [provider]  butalbital-acetaminophen-caffeine (FIORICET, ESGIC) 50-325-40 MG per tablet Take 1 tablet by mouth every 6 (six) hours as needed for headache.     [provider]  calcium acetate (PHOSLO) 667 MG capsule Take 667-1,334 mg by mouth See admin instructions. Take 1334 mg with each meal and 667 mg with each snack    [provider]  Calcium Carb-Cholecalciferol (CALCIUM + D3 PO) Take 1 tablet by mouth daily.     [provider]  Cholecalciferol (VITAMIN D3) 50 MCG (2000 UT) capsule Take 4,000 Units by mouth 2 (two) times daily.      [provider]  dicyclomine (BENTYL) 20 MG tablet Take 20 mg by mouth 4 (four) times daily as needed for spasms.     [provider]  diphenhydrAMINE (BENADRYL) 25 MG tablet Take 50 mg by mouth 3 (three) times daily as needed for itching.    [provider]  gabapentin (NEURONTIN) 300 MG capsule Take 300 mg by mouth See admin instructions. Take 300 mg daily, may take an additional 300 mg dose up to 2 times daily as needed for pain    [provider]  hydrochlorothiazide (HYDRODIURIL) 25 MG tablet Take 25 mg by mouth daily.    [provider]  HYDROcodone-acetaminophen (NORCO) 5-325 MG tablet Take 1 tablet by mouth every 6 (six) hours as needed for moderate pain. 11/27/18   Dagoberto Ligas, PA-C  insulin lispro (HUMALOG) 100 UNIT/ML injection Inject 10 Units into the skin 3 (three) times daily with meals.     [provider]  isosorbide mononitrate (IMDUR) 30 MG 24 hr tablet Take 30 mg by mouth daily.  03/03/12   [provider]  LANTUS SOLOSTAR 100 UNIT/ML injection Inject 45-65 Units into the skin at bedtime. According to sugar level.    [provider]  lidocaine-prilocaine (EMLA) cream Apply 1 application topically as needed (port access).    [provider]  loperamide (IMODIUM) 1 MG/5ML solution Take 1-2 mg by mouth as needed for diarrhea or loose stools.    [provider]  losartan (COZAAR) 100 MG tablet Take 100 mg by mouth daily.  02/19/18   [provider]  magnesium oxide (MAG-OX) 400 MG tablet Take 400 mg by mouth at bedtime.     [provider]  metoprolol (LOPRESSOR) 100 MG tablet Take 100 mg by mouth 2 (two) times daily.  12/07/11   [provider]  Naphazoline-Glycerin (REDNESS RELIEF OP) Place 1 drop into both eyes daily as needed (redness/ dryness).    [provider]  NITROSTAT 0.4 MG SL tablet Place 0.4 mg under the tongue every 5 (five) minutes as needed for  chest pain.  06/01/13   [provider]  Omega-3 Fatty Acids (FISH OIL) 1000 MG CAPS  Take 2,000 mg by mouth 2 (two) times a day.    [provider]  omeprazole (PRILOSEC) 20 MG capsule Take 20 mg by mouth 2 (two) times daily before a meal.    [provider]  ondansetron (ZOFRAN) 4 MG tablet Take 4 mg by mouth every 8 (eight) hours as needed for nausea or vomiting.    [provider]  oxyCODONE-acetaminophen (PERCOCET/ROXICET) 5-325 MG tablet Take 1 tablet by mouth every 6 (six) hours as needed. 01/21/19   Ulyses Amor, PA-C  primidone (MYSOLINE) 50 MG tablet Take 50 mg by mouth at bedtime. 06/17/18   [provider]  STIOLTO RESPIMAT 2.5-2.5 MCG/ACT AERS Inhale 2 puffs into the lungs at bedtime. 09/24/18   [provider]    Family History Family History  Problem Relation Age of Onset  . Cancer Mother   . Cancer Father   . Diabetes Brother   . Cancer Brother   . Diabetes Sister   . Cancer Sister   . Cancer Sister   . Colon cancer Neg Hx     Social History Social History   Tobacco Use  . Smoking status: Former Smoker    Packs/day: 0.00    Years: 60.00    Pack years: 0.00    Types: E-cigarettes, Cigarettes    Quit date: 11/10/2014    Years since quitting: 4.2  . Smokeless tobacco: Never Used  Substance Use Topics  . Alcohol use: No  . Drug use: No     Allergies   Ace inhibitors, Statins, and Codeine   Review of Systems Review of Systems  Constitutional: Positive for fever.  HENT: Negative for sore throat.   Eyes: Negative for pain, redness and visual disturbance.  Respiratory: Negative for cough and shortness of breath.   Cardiovascular: Negative for chest pain.  Gastrointestinal: Negative for abdominal pain, diarrhea and vomiting.  Endocrine: Negative for polyuria.  Genitourinary: Negative for dysuria and flank pain.  Musculoskeletal: Negative for back pain, neck pain and neck stiffness.  Skin: Negative for  rash.  Neurological: Positive for weakness. Negative for headaches.  Hematological: Does not bruise/bleed easily.  Psychiatric/Behavioral: Negative for confusion.     Physical Exam Updated Vital Signs BP 98/64 (BP Location: Right Wrist)   Pulse 69   Temp 98.5 F (36.9 C) (Oral)   Resp 16   SpO2 100%   Physical Exam Vitals signs and nursing note reviewed.  Constitutional:      Appearance: Normal appearance. He is well-developed.  HENT:     Head: Atraumatic.     Nose:     Comments: External nose absent/prior remote surgery.     Mouth/Throat:     Mouth: Mucous membranes are moist.     Pharynx: Oropharynx is clear.  Eyes:     General: No scleral icterus.    Conjunctiva/sclera: Conjunctivae normal.     Pupils: Pupils are equal, round, and reactive to light.  Neck:     Musculoskeletal: Normal range of motion and neck supple. No neck rigidity.     Trachea: No tracheal deviation.     Comments: No stiffness or rigidity.  Cardiovascular:     Rate and Rhythm: Normal rate and regular rhythm.     Pulses: Normal pulses.     Heart sounds: Normal heart sounds. No murmur. No friction rub. No gallop.   Pulmonary:     Effort: Pulmonary effort is normal. No accessory muscle usage or respiratory distress.     Breath sounds:  Normal breath sounds.     Comments: HD cath right chest, no purulent drainage from site. Confluent around and inferior to catheter, across upper chest wall, diffuse erythema. No crepitus or marked pain or tenderness to area.   Rales bases.  Abdominal:     General: Bowel sounds are normal. There is no distension.     Palpations: Abdomen is soft.     Tenderness: There is no abdominal tenderness. There is no guarding.  Genitourinary:    Comments: No cva tenderness. Musculoskeletal:     Right lower leg: No edema.     Left lower leg: No edema.     Comments: Healing surgical incisions to LUE with moderate swelling and bruising about area  - see photo. +pain/tenderness  to left upper ext. Although moderate swelling is present, compartments of upper arm appear soft, not tense. No crepitus. Distal pulse palp.   Skin:    General: Skin is warm and dry.     Findings: No rash.  Neurological:     Mental Status: He is alert.     Comments: Alert, speech clear. Motor intact bil, stre 5/5. sens grossly intact bil.   Psychiatric:        Mood and Affect: Mood normal.          ED Treatments / Results  Labs (all labs ordered are listed, but only abnormal results are displayed) Results for orders placed or performed during the hospital encounter of 01/24/19  Lactic acid, plasma  Result Value Ref Range   Lactic Acid, Venous 0.9 0.5 - 1.9 mmol/L  Comprehensive metabolic panel  Result Value Ref Range   Sodium 136 135 - 145 mmol/L   Potassium 3.9 3.5 - 5.1 mmol/L   Chloride 94 (L) 98 - 111 mmol/L   CO2 19 (L) 22 - 32 mmol/L   Glucose, Bld 131 (H) 70 - 99 mg/dL   BUN 66 (H) 8 - 23 mg/dL   Creatinine, Ser 12.21 (H) 0.61 - 1.24 mg/dL   Calcium 7.2 (L) 8.9 - 10.3 mg/dL   Total Protein 6.5 6.5 - 8.1 g/dL   Albumin 2.8 (L) 3.5 - 5.0 g/dL   AST 42 (H) 15 - 41 U/L   ALT 8 0 - 44 U/L   Alkaline Phosphatase 89 38 - 126 U/L   Total Bilirubin 0.5 0.3 - 1.2 mg/dL   GFR calc non Af Amer 3 (L) >60 mL/min   GFR calc Af Amer 4 (L) >60 mL/min   Anion gap 23 (H) 5 - 15  CBC WITH DIFFERENTIAL  Result Value Ref Range   WBC 13.1 (H) 4.0 - 10.5 K/uL   RBC 3.21 (L) 4.22 - 5.81 MIL/uL   Hemoglobin 9.2 (L) 13.0 - 17.0 g/dL   HCT 29.2 (L) 39.0 - 52.0 %   MCV 91.0 80.0 - 100.0 fL   MCH 28.7 26.0 - 34.0 pg   MCHC 31.5 30.0 - 36.0 g/dL   RDW 16.8 (H) 11.5 - 15.5 %   Platelets 276 150 - 400 K/uL   nRBC 0.0 0.0 - 0.2 %   Neutrophils Relative % 80 %   Neutro Abs 10.5 (H) 1.7 - 7.7 K/uL   Lymphocytes Relative 8 %   Lymphs Abs 1.0 0.7 - 4.0 K/uL   Monocytes Relative 6 %   Monocytes Absolute 0.8 0.1 - 1.0 K/uL   Eosinophils Relative 4 %   Eosinophils Absolute 0.6 (H) 0.0 -  0.5 K/uL   Basophils Relative 1 %  Basophils Absolute 0.1 0.0 - 0.1 K/uL   Immature Granulocytes 1 %   Abs Immature Granulocytes 0.14 (H) 0.00 - 0.07 K/uL  CBG monitoring, ED  Result Value Ref Range   Glucose-Capillary 125 (H) 70 - 99 mg/dL   Comment 1 Notify RN    Comment 2 Document in Chart      EKG EKG Interpretation  Date/Time:  Saturday January 24 2019 13:41:12 EDT Ventricular Rate:  69 PR Interval:    QRS Duration: 95 QT Interval:  414 QTC Calculation: 444 R Axis:   79 Text Interpretation:  Sinus rhythm Low voltage, precordial leads No significant change since last tracing Confirmed by Lajean Saver [id number]) on 01/24/2019 2:09:24 PM   Radiology Dg Chest Port 1 View  Result Date: 01/24/2019 CLINICAL DATA:  Fever, weakness EXAM: PORTABLE CHEST 1 VIEW COMPARISON:  01/21/2019 FINDINGS: Right dialysis catheter in place, unchanged. Mild cardiomegaly and vascular congestion. Left base opacity, likely atelectasis. No overt edema or effusions. No acute bony abnormality. IMPRESSION: The megaly, vascular congestion. Left base opacity, likely atelectasis. Electronically Signed   By: Rolm Baptise M.D.   On: 01/24/2019 15:29    Procedures Procedures (including critical care time)  Medications Ordered in ED Medications  vancomycin (VANCOCIN) IVPB 1000 mg/200 mL premix (has no administration in time range)  cefTRIAXone (ROCEPHIN) 2 g in sodium chloride 0.9 % 100 mL IVPB (has no administration in time range)     Initial Impression / Assessment and Plan / ED Course  I have reviewed the triage vital signs and the nursing notes.  Pertinent labs & imaging results that were available during my care of the patient were reviewed by me and considered in my medical decision making (see chart for details).  Iv ns. Continuous pulse ox and monitor. Patient hypoxic on room air with sats in low 80s, improved on o2 [place]. Ecg. Stat pcxr, cultures, and labs.   After labs/cultures drawn, iv  antibiotics given.   Reviewed nursing notes and prior charts for additional history.   Labs reviewed by me - wbc elevated, 13. Lactate is normal.  Given recent post-op/day 3, vascular surgery consulted - discussed pt, pictures w Dr Carlis Abbott, on call for vascular - agrees w admit to medicine, abx, they will follow - he indicates LUE appears c/w post-op changes.  Family member subsequently arrives, they indicate the erythema to right chest has been present from reaction to tape in the past. Given area is not tender, and not especially warm, suspect the erythema to chest wall around new catheter is more related to reaction to tape, than it is a cellulitis.   pts cxr with basilar opacity possible atelectasis, possible pna. As new o2 requirement, could be early resp infection. Iv abx have been given.   Medicine consulted for admission. Discussed w internal medicine resident on call - will admit.   Recheck pt, bp improved post 250 cc bolus (given esrd/hd, normal lactate, will be cautious w ivf).  CXR reviewed by me - vascular congestion.   Recheck pt, denies new symptoms. No headache. No cp. No abd pain or tenderness.   CRITICAL CARE RE: sepsis in patient with ESRD/HD, fever, weakness, low blood pressure, hypoxia. Performed by: Mirna Mires Total critical care time: 40 minutes Critical care time was exclusive of separately billable procedures and treating other patients. Critical care was necessary to treat or prevent imminent or life-threatening deterioration. Critical care was time spent personally by me on the following activities: development of treatment  plan with patient and/or surrogate as well as nursing, discussions with consultants, evaluation of patient's response to treatment, examination of patient, obtaining history from patient or surrogate, ordering and performing treatments and interventions, ordering and review of laboratory studies, ordering and review of radiographic studies,  pulse oximetry and re-evaluation of patient's condition.  Renal consulted as pt will need dialysis during admission.   Final Clinical Impressions(s) / ED Diagnoses   Final diagnoses:  None    ED Discharge Orders    None             Lajean Saver, MD 01/24/19 1616

## 2019-01-24 NOTE — H&P (Signed)
Date: 01/24/2019               Patient Name:  Thomas [name] MRN: [id number]  DOB: 10-01-1940 Age / Sex: 78 y.o., male   PCP: [facility]         Medical Service: Internal Medicine Teaching Service         Attending Physician: Dr. Rebeca Alert, Raynaldo Opitz, MD    First Contact: Marva Panda, MD, [place] Pager: [place] [phone])  Second Contact: Shan Levans, MD, Erlene Quan Pager: [place] [phone])       After Hours (After 5p/  First Contact Pager: [phone]  weekends / holidays): Second Contact Pager: [phone]   Chief Complaint: generalized weakness   History of Present Illness: 78 y.o. yo male w/ PMH significant for T2DM, ESRD on HD MWF, COPD on 3L O2, CAD s/p PCI x5, HTN, and HLD presenting with generalized weakness and fever 100-101 for 2 days. History is obtained by patient and his daughter who is at bedside. She reports that he had left arm fistula placement and right IJ dialysis catheter insertion on 01/21/2019. He had an incomplete hemodialysis session on 01/22/2019 and was too weak to go to dialysis on Friday. He also complains of left arm pain at site of fistula. He denies any headaches, dizziness, abdominal pain, nausea/vomiting, diarrhea, worsening cough, focal weakness or sensory changes, chest pain, or known sick contacts.   ED course: Patient arrived to ED via EMS. Patient found to be hypoxic on RA with sats in low 80s and improved to 100% with O2 NRB. Chest X-ray with basilar opacity that could be atelectasis vs. pneumonia. Blood cultures drawn and patient started on IV vancomycin and given one dose of Rocephin. Vascular consulted and patient admitted to internal medicine teaching service.   Meds:  Current Meds  Medication Sig  . acetaminophen (TYLENOL) 650 MG CR tablet Take 1,300 mg by mouth every 8 (eight) hours as needed for pain.   Marland Kitchen albuterol (PROVENTIL,VENTOLIN) 2 MG/5ML syrup Take 2 mg by mouth 2 (two) times a day.   . albuterol (VENTOLIN HFA) 108 (90 Base) MCG/ACT inhaler  Inhale 1-2 puffs into the lungs every 4 (four) hours as needed for wheezing or shortness of breath.  . ALPRAZolam (XANAX) 1 MG tablet Take 1 mg by mouth at bedtime as needed for sleep.   Marland Kitchen amLODipine (NORVASC) 10 MG tablet Take 10 mg by mouth daily at 12 noon.   Marland Kitchen b complex-vitamin c-folic acid (NEPHRO-VITE) 0.8 MG TABS tablet Take 1 tablet by mouth daily.  . butalbital-acetaminophen-caffeine (FIORICET, ESGIC) 50-325-40 MG per tablet Take 1 tablet by mouth every 6 (six) hours as needed for headache.   . calcium acetate (PHOSLO) 667 MG capsule Take 667-1,334 mg by mouth See admin instructions. Take 1334 mg with each meal and 667 mg with each snack  . Calcium Carb-Cholecalciferol (CALCIUM + D3 PO) Take 1 tablet by mouth daily.   . Cholecalciferol (VITAMIN D3) 50 MCG (2000 UT) capsule Take 4,000 Units by mouth 2 (two) times daily.   Marland Kitchen dicyclomine (BENTYL) 20 MG tablet Take 20 mg by mouth 4 (four) times daily as needed for spasms.   . diphenhydrAMINE (BENADRYL) 25 MG tablet Take 50 mg by mouth 3 (three) times daily as needed for itching.  . gabapentin (NEURONTIN) 300 MG capsule Take 300 mg by mouth See admin instructions. Take 300 mg daily, may take an additional 300 mg dose up to 2 times daily as needed for pain  .  hydrochlorothiazide (HYDRODIURIL) 25 MG tablet Take 25 mg by mouth daily.  . insulin lispro (HUMALOG) 100 UNIT/ML injection Inject 10 Units into the skin 3 (three) times daily with meals.   . isosorbide mononitrate (IMDUR) 30 MG 24 hr tablet Take 30 mg by mouth daily.   Marland Kitchen LANTUS SOLOSTAR 100 UNIT/ML injection Inject 45-65 Units into the skin at bedtime. According to sugar level.  . lidocaine-prilocaine (EMLA) cream Apply 1 application topically as needed (port access).  Marland Kitchen loperamide (IMODIUM) 1 MG/5ML solution Take 1-2 mg by mouth as needed for diarrhea or loose stools.  Marland Kitchen losartan (COZAAR) 100 MG tablet Take 100 mg by mouth daily.   . magnesium oxide (MAG-OX) 400 MG tablet Take 400 mg by  mouth at bedtime.   . metoprolol (LOPRESSOR) 100 MG tablet Take 100 mg by mouth 2 (two) times daily.   . Naphazoline-Glycerin (REDNESS RELIEF OP) Place 1 drop into both eyes daily as needed (redness/ dryness).  Marland Kitchen NITROSTAT 0.4 MG SL tablet Place 0.4 mg under the tongue every 5 (five) minutes as needed for chest pain.   . Omega-3 Fatty Acids (FISH OIL) 1000 MG CAPS Take 2,000 mg by mouth 2 (two) times a day.  Marland Kitchen omeprazole (PRILOSEC) 20 MG capsule Take 20 mg by mouth 2 (two) times daily before a meal.  . ondansetron (ZOFRAN) 4 MG tablet Take 4 mg by mouth every 8 (eight) hours as needed for nausea or vomiting.  Marland Kitchen oxyCODONE-acetaminophen (PERCOCET/ROXICET) 5-325 MG tablet Take 1 tablet by mouth every 6 (six) hours as needed.  . primidone (MYSOLINE) 50 MG tablet Take 50 mg by mouth at bedtime.  Marland Kitchen STIOLTO RESPIMAT 2.5-2.5 MCG/ACT AERS Inhale 2 puffs into the lungs at bedtime.     Allergies: Allergies as of 01/24/2019 - Review Complete 01/24/2019  Allergen Reaction Noted  . Ace inhibitors Anaphylaxis 11/25/2013  . Statins Anaphylaxis and Swelling 07/15/2015  . Codeine Other (See Comments)    Past Medical History:  Diagnosis Date  . Anemia   . Arthritis    hands, back  . Cancer (HCC)    nose  . Cardiac arrest ([place])   . Cataract   . CHF (congestive heart failure) ([place])   . COPD (chronic obstructive pulmonary disease) ([place])   . Coronary artery disease   . Diabetes mellitus ([place])    Type II  . Diverticulosis 2013   tcs by RMR  . Diverticulosis   . Erosive esophagitis   . ESRD on hemodialysis (HCC)    CKD, hemodialysis Tues/Thurs/Sat  . Family history of adverse reaction to anesthesia    Daughter slow to awaken and extreme nausea  . GERD (gastroesophageal reflux disease)   . Headache   . Hiatal hernia    pt not aware of this  . HTN (hypertension)   . Hypercholesterolemia   . Hypothyroidism   . Irritable bowel syndrome (IBS)   . Jerking    hands or feet- 01/2019- has decreased   . Lymphocytic colitis 2013   tcs by RMR  . Myocardial infarction ([place])    X2  . Nasal mass   . Neuropathy   . Pneumonia   . Schatzki's ring   . SDH (subdural hematoma) ([place])    02/2018, conservative management  . Shortness of breath   . Sleep apnea    uses O2 only  . Supplemental oxygen dependent    At HS and PRN during the day  . Tobacco abuse   . Wears dentures   .  Wears glasses     Family History:  Family History  Problem Relation Age of Onset  . Cancer Mother   . Cancer Father   . Diabetes Brother   . Cancer Brother   . Diabetes Sister   . Cancer Sister   . Cancer Sister   . Colon cancer Neg Hx      Social History:  Social History   Tobacco Use  . Smoking status: Former Smoker    Packs/day: 0.00    Years: 60.00    Pack years: 0.00    Types: E-cigarettes, Cigarettes    Quit date: 11/10/2014    Years since quitting: 4.2  . Smokeless tobacco: Never Used  Substance Use Topics  . Alcohol use: No  . Drug use: No     Review of Systems: A complete ROS was negative except as per HPI.  Physical Exam: Blood pressure (!) 112/57, pulse 72, temperature 98.5 F (36.9 C), temperature source Oral, resp. rate 14, height 5\' 11"  (1.803 m), weight 99.8 kg, SpO2 100 %.   Physical Exam Constitutional:      Appearance: He is obese. He is ill-appearing.  HENT:     Head: Normocephalic and atraumatic.     Mouth/Throat:     Mouth: Mucous membranes are moist.  Eyes:     General: No scleral icterus.    Extraocular Movements: Extraocular movements intact.     Conjunctiva/sclera: Conjunctivae normal.     Pupils: Pupils are equal, round, and reactive to light.  Neck:     Musculoskeletal: Normal range of motion and neck supple.  Cardiovascular:     Rate and Rhythm: Normal rate and regular rhythm.     Heart sounds: Normal heart sounds. No murmur. No friction rub. No gallop.      Comments: Distal pulses intact  BLE pulses weaker than BUE Pulmonary:     Effort: Pulmonary  effort is normal.     Breath sounds: No stridor. Wheezing present. No rhonchi or rales.  Abdominal:     General: Bowel sounds are normal. There is distension.     Tenderness: There is no abdominal tenderness. There is no guarding.  Musculoskeletal: Normal range of motion.        General: No swelling or tenderness.  Skin:    General: Skin is warm.     Findings: Bruising present.     Comments: CHEST: HD cath right chest, no drainage noted from site. Diffuse erythema around the catheter and the upper chest. No pain or tenderness to area. No warmth.  LEFT ARM: Healing surgical incisions surrounded by bruising and moderate swelling. Warm to touch and pain/tenderness to palpation.No active drainage noted from incision sites.  BLE: Cool to touch without any significant skin changes.   Neurological:     General: No focal deficit present.     Mental Status: He is alert and oriented to person, place, and time. Mental status is at baseline.     Cranial Nerves: No cranial nerve deficit.     Motor: Weakness present.     Comments: No sensation to touch in bilateral lower extremities      EKG: personally reviewed my interpretation is NSR with 66bpm.   CXR: IMPRESSION: cardiomegaly and vascular congestion. Left base opacity, likely atelectasis.   Assessment & Plan by Problem: Active Problems:   Generalized weakness  Patient is a 78 yo male with COPD, CAD, DM, SCC of nose, ESRD on HD that presented with generalized weakness, fever and hypoxia  from home.   Generalized Weakness and Fever:  Patient presenting with generalized weakness and fever 100-101 for 2 days. WBC 13.1, neutrophil predominant. Lactic acid 0.9. He is afebrile but slightly hypotensive on admission. Patient had left arm basilic vein fistula placement and right IJ dialysis catheter insertion on 01/21/2019. He had an incomplete hemodialysis session on 01/22/2019 and was too weak to go to dialysis on Friday. He also complains of left arm  pain at site of fistula. Healing surgical incisions on left arm surrounded by bruising and moderate swelling. Warm to touch and pain/tenderness to palpation.No active drainage noted from incision sites. Fistula with good thrill. Per vascular surgery, no immediate surgical intervention needed at this time.  Patient found to be hypoxic on RA with sats in low 80s and improved to 100% with O2 NRB. CXR with basilar opacity that could be atelectasis vs. pneumonia. Blood cultures drawn and patient started on IV vancomycin and given one dose of Rocephin.   - Vancomycin 1000mg  IV MWF  - F/u blood cultures - Continue to monitor vitals   ESRD on HD:  Patient with Hx of ESRD on HD MWF. Of note, patient did not receive dialysis on Wednesday, had half a session on Thursday and no dialysis on Friday. Patient not in need of emergent dialysis at this time. Nephrology consulted.   - calcium acetate  - HD per nephrology   COPD:  Patient has Hx of COPD with 3L home O2. Patient also uses albuterol daily and has daily nebulizer treatments. Also uses Stiolto respimat 2 puffs qHS. He has morning productive cough at baseline unchanged.   -Combivent 2puff q12h   Hypertension:  Patient with Hx of HTN on amlodipine 10mg  qd, metoprolol 100mg  qd, HCTZ 25mg  qd, IMDUR 30mg  qd, and losartan 100mg  qd.   - Hold home BP meds given low BP   Type II DM:  Patient with Hx of type II DM. Patient on  Lantus 45-65U qHS + Humalog 10U tid. Per daughter, CBG 80-90s at home this morning.   - Lantus 10U + Novolog 4u tid + SSI - CBG monitoring   Tape allergy:  Diffuse erythema around catheter and upper right chest without warmth, pain or tenderness to area. Per daughter, this is chronic and patient requires benadryl for symptomatic control.   - Benadryl 25mg  q12h prn  Diabetic neuropathy: Patient has hx of diabetic neuropathy and is on gabapentin at home. On examination, gross sensation to touch is absent in bilateral lower  extremities.  - Gabapentin 100mg  bid   DVT Prophylaxis: Heparin  Diet: Regular diet  Code: Full   Dispo: Admit patient to Inpatient with expected length of stay greater than 2 midnights.  Signed: Harvie Heck, MD  Internal Medicine, PGY-1 01/24/2019, 5:05 PM  Pager: [phone]

## 2019-01-24 NOTE — Progress Notes (Signed)
Pharmacy Antibiotic Note  Thomas [name] is a 78 y.o. male admitted on 01/24/2019 with cellulitis.  Pharmacy has been consulted for vancomycin dosing. Pt is afebrile but WBC is elevated at 13.1. Pt with ESRD on HD. Lactic acid is WNL.  Plan: Vancomycin 2gm IV x 1 then 1gm IV post-HD F/u renal plans, C&S, clinical status and pre-HD vanc level as needed    Temp (24hrs), Avg:98.5 F (36.9 C), Min:98.5 F (36.9 C), Max:98.5 F (36.9 C)  Recent Labs  Lab 01/24/19 1443  WBC 13.1*  CREATININE 12.21*  LATICACIDVEN 0.9    Estimated Creatinine Clearance: 6 mL/min (A) (by C-G formula based on SCr of 12.21 mg/dL (H)).    Allergies  Allergen Reactions  . Ace Inhibitors Anaphylaxis    Swelling in throat and neck  . Statins Anaphylaxis and Swelling    Caused feet, tongue and throat to swell.  . Codeine Other (See Comments)    Muscle Spasms    Antimicrobials this admission: Vanc 8/22>> CTX x 1 8/22  Dose adjustments this admission: N/A  Microbiology results: Pending  Thank you for allowing pharmacy to be a part of this patient's care.  Woodard Perrell, Rande Lawman 01/24/2019 2:08 PM

## 2019-01-24 NOTE — ED Triage Notes (Signed)
[facility] reports that the the patient got a new dialysis port and PICC line on Wednesday. He got partial dialysis on Thursday because he felt weak and he did not on Friday for the same reason. EMS reports he had a fever at home of 100.4 and one episode of vomiting. Last vitals were 125/43, 72% O2 on room air, hr-72 and respirations of 18.

## 2019-01-24 NOTE — Progress Notes (Signed)
Patient in room.  Restricted bracelet placed and allergy bracelet placed.   CCMD called.  Iv team consult for dressing of right chest port.  Daughter at bedside.  Allergy medication given for itchiness. Marland Kitchen

## 2019-01-24 NOTE — ED Provider Notes (Signed)
Dr. Jonnie Finner aware of patient and will see today. Pt not in need of emergent dialysis at this time.   Noemi Chapel, MD 01/24/19 [phone]

## 2019-01-24 NOTE — Consult Note (Addendum)
Renal Service Consult Note [facility]  Thomas [name] 01/24/2019 Sol Blazing Requesting Physician:  Dr Rebeca Alert, A.   Reason for Consult:   HPI: The patient is a 78 y.o. year-old with hx of neuropathy, DM2, HTN, HL, CAD, COPD, CHF and ESRD on HD who presented to ED today w/ c/o gen'd weakness, fevers and 1 episode vomiting. He had a new dialysis port and PICC line done on Wed and missed HD that day and didn't get full HD on Friday either. In ED VSS, O2 was 72% on RA, RR 18.  Put on NRB then Venturi mask w/ stable sats in 96-100% range. CXR showed L basilar atx possible infiltrate and no edema. He had skin reddening around his [facility] prior to exchange this week and the wife said it was an allergy to paper tape used at HD. He also has had some numbness in L 1-3 fingers since AVF placed. Labs showed normal K 3.9 and BUN 66 w/ creat 12.2, Ca 7.2 and alb 2.8.  LFT's ok.  WBC 13k, Hb 9. VVS was consulted and pt was admitted. Temp was 98 deg. COVID results is pending. Pt admitted to teaching service, rec'd 1 dose IV vanc, blood cx's sent.  NS 250 cc bolus for one low BP in 90's, improved back up to 112/47.  Asked to see for ESRD.   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  marked the vein segment with adherent clot and recommend avoiding this area in the near future."  11/05/18 IR Dr Reesa Chew - placed new R IJ [place] for HD  11/27/18 VVS Dr Trula Slade - did revision of L AVF w/ branch ligation x 2 and open thrombectomy. Plication was not done because would have narrowed the AVF too much per the OP note.     No prior admits noted here.   Patient seen in the room. He is drowsy and falls asleep during conversation. He denies any CP , fevers or prod cough. No SOB. Arms and legs are jerking, he states this has been going  on "for years".  L arm swelling since surgery. h as been on HD for 2-3 years.   ROS  denies CP  no joint pain   no HA  no blurry vision  no rash  no diarrhea  no nausea/ vomiting    Past Medical History  Past Medical History:  Diagnosis Date  . Anemia   . Arthritis    hands, back  . Cancer (HCC)    nose  . Cardiac arrest ([place])   . Cataract   . CHF (congestive heart failure) ([place])   . COPD (chronic obstructive pulmonary disease) ([place])   . Coronary artery disease   . Diabetes mellitus ([place])    Type II  . Diverticulosis 2013   tcs by RMR  . Diverticulosis   . Erosive esophagitis   . ESRD on hemodialysis (HCC)    CKD, hemodialysis Tues/Thurs/Sat  . Family history of adverse reaction to anesthesia    Daughter slow to awaken and extreme nausea  . GERD (gastroesophageal reflux disease)   . Headache   . Hiatal hernia    pt not aware of this  . HTN (hypertension)   . Hypercholesterolemia   .  Hypothyroidism   . Irritable bowel syndrome (IBS)   . Jerking    hands or feet- 01/2019- has decreased  . Lymphocytic colitis 2013   tcs by RMR  . Myocardial infarction ([place])    X2  . Nasal mass   . Neuropathy   . Pneumonia   . Schatzki's ring   . SDH (subdural hematoma) ([place])    02/2018, conservative management  . Shortness of breath   . Sleep apnea    uses O2 only  . Supplemental oxygen dependent    At HS and PRN during the day  . Tobacco abuse   . Wears dentures   . Wears glasses    Past Surgical History  Past Surgical History:  Procedure Laterality Date  . AV FISTULA PLACEMENT Left 06/29/2013   Procedure: LEFT RADIAL/CEPHALIC FISTULA;  Surgeon: Conrad Seabrook, MD;  Location: [place];  Service: Vascular;  Laterality: Left;  . BASCILIC VEIN TRANSPOSITION Left 01/21/2019   Procedure: left arm Bascilic Vein Transposition;  Surgeon: Waynetta Sandy, MD;  Location: [place];  Service: Vascular;  Laterality: Left;  . cardiac stents     5  . CATARACT EXTRACTION W/PHACO  Left 05/17/2014   Procedure: CATARACT EXTRACTION PHACO AND INTRAOCULAR LENS PLACEMENT (IOC);  Surgeon: Tonny Branch, MD;  Location: [facility];  Service: Ophthalmology;  Laterality: Left;  CDE:7.71  . CATARACT EXTRACTION W/PHACO Right 06/14/2014   Procedure: CATARACT EXTRACTION PHACO AND INTRAOCULAR LENS PLACEMENT RIGHT EYE;  Surgeon: Tonny Branch, MD;  Location: [facility];  Service: Ophthalmology;  Laterality: Right;  CDE:8.22  . CHOLECYSTECTOMY    . COLONOSCOPY  December 2007   adenomatous polyps, sigmoid diverticula  . COLONOSCOPY  02/06/2012   Dr. Gala Romney- colonic diverticulosis, bx consistent with lymphocytic colitis  . CORONARY ANGIOPLASTY     x5 stents  . ESOPHAGOGASTRODUODENOSCOPY  May 2009   RMR: non-critical Schatzki's ring, sp dilation with 62 F [place], small inlet patch, mild erosive relufx esophagitis  . ESOPHAGOGASTRODUODENOSCOPY N/A 07/15/2012   Dr. Gala Romney- hiatal hernia, duodenal ulcer with surrounding erosions. this may well be related to the "haziness" sen around the head of the pancreas and duodenum on recent noncontrast ct scan and may have much to do with his abdominal pain.  Marland Kitchen EXCHANGE OF A DIALYSIS CATHETER Right 01/21/2019   Procedure: EXCHANGE OF A DIALYSIS CATHETER, right internal jugular;  Surgeon: Waynetta Sandy, MD;  Location: [place];  Service: Vascular;  Laterality: Right;  . EXCISION NASAL MASS N/A 07/18/2015   Procedure: EXCISION OF NASAL MASS;  Surgeon: Leta Baptist, MD;  Location: [place];  Service: ENT;  Laterality: N/A;  . INSERTION OF DIALYSIS CATHETER Right 05/21/2013   Procedure: INSERTION OF DIALYSIS CATHETER;  Surgeon: Conrad Fredericktown, MD;  Location: [place];  Service: Vascular;  Laterality: Right;  . IR DIALY SHUNT INTRO [place] W/IMG LEFT Left 10/29/2018  . IR FLUORO GUIDE CV LINE RIGHT  11/05/2018  . IR US GUIDE VASC ACCESS LEFT  10/29/2018  . IR US GUIDE VASC ACCESS RIGHT  11/05/2018  . LIGATION OF COMPETING BRANCHES OF ARTERIOVENOUS FISTULA Left 11/27/2018    Procedure: Ligation Of Competing Branches Of Arteriovenous Fistula;  Surgeon: [name], MD;  Location: [facility] OR;  Service: Vascular;  Laterality: Left;  Marland Kitchen MULTIPLE TOOTH EXTRACTIONS    . REVISION OF ARTERIOVENOUS GORETEX GRAFT Left 11/27/2018   Procedure: Revision of Left Arm Arteriovenious Fistula;  Surgeon: [name], MD;  Location: [place];  Service: Vascular;  Laterality: Left;  . THROMBECTOMY W/ EMBOLECTOMY Left 11/27/2018   Procedure: Thrombectomy Arteriovenous Fistula;  Surgeon: [name], MD;  [facility] OR;  Service: Vascular;  Laterality: Left;  Marland Kitchen VASECTOMY     Family History  Family History  Problem Relation Age of Onset  . Cancer Mother   . Cancer Father   . Diabetes Brother   . Cancer Brother   . Diabetes Sister   . Cancer Sister   . Cancer Sister   . Colon cancer Neg Hx    Social History  reports that he quit smoking about 4 years ago. His smoking use included e-cigarettes and cigarettes. He smoked 0.00 packs per day for 60.00 years. He has never used smokeless tobacco. He reports that he does not drink alcohol or use drugs. Allergies  Allergies  Allergen Reactions  . Ace Inhibitors Anaphylaxis    Swelling in throat and neck  . Statins Anaphylaxis and Swelling    Caused feet, tongue and throat to swell.  . Codeine Other (See Comments)    Muscle Spasms   Home medications Prior to Admission medications   Medication Sig Start Date End Date Taking? Authorizing Provider  acetaminophen (TYLENOL) 650 MG CR tablet Take 1,300 mg by mouth every 8 (eight) hours as needed for pain.    Yes [provider]  albuterol (PROVENTIL,VENTOLIN) 2 MG/5ML syrup Take 2 mg by mouth 2 (two) times a day.    Yes [provider]  albuterol (VENTOLIN HFA) 108 (90 Base) MCG/ACT inhaler Inhale 1-2 puffs into the lungs every 4 (four) hours as needed for wheezing or shortness of breath.   Yes [provider]  ALPRAZolam Duanne Moron) 1 MG tablet Take 1 mg by  mouth at bedtime as needed for sleep.  07/30/18  Yes [provider]  amLODipine (NORVASC) 10 MG tablet Take 10 mg by mouth daily at 12 noon.    Yes [provider]  b complex-vitamin c-folic acid (NEPHRO-VITE) 0.8 MG TABS tablet Take 1 tablet by mouth daily.   Yes [provider]  butalbital-acetaminophen-caffeine (FIORICET, ESGIC) 50-325-40 MG per tablet Take 1 tablet by mouth every 6 (six) hours as needed for headache.    Yes [provider]  calcium acetate (PHOSLO) 667 MG capsule Take 667-1,334 mg by mouth See admin instructions. Take 1334 mg with each meal and 667 mg with each snack   Yes [provider]  Calcium Carb-Cholecalciferol (CALCIUM + D3 PO) Take 1 tablet by mouth daily.    Yes [provider]  Cholecalciferol (VITAMIN D3) 50 MCG (2000 UT) capsule Take 4,000 Units by mouth 2 (two) times daily.    Yes [provider]  dicyclomine (BENTYL) 20 MG tablet Take 20 mg by mouth 4 (four) times daily as needed for spasms.    Yes [provider]  diphenhydrAMINE (BENADRYL) 25 MG tablet Take 50 mg by mouth 3 (three) times daily as needed for itching.   Yes [provider]  gabapentin (NEURONTIN) 300 MG capsule Take 300 mg by mouth See admin instructions. Take 300 mg daily, may take an additional 300 mg dose up to 2 times daily as needed for pain   Yes [provider]  hydrochlorothiazide (HYDRODIURIL) 25 MG tablet Take 25 mg by mouth daily.   Yes [provider]  insulin lispro (HUMALOG) 100 UNIT/ML injection Inject 10 Units into the skin 3 (three) times daily with meals.    Yes [provider]  isosorbide mononitrate (IMDUR)  30 MG 24 hr tablet Take 30 mg by mouth daily.  03/03/12  Yes [provider]  LANTUS SOLOSTAR 100 UNIT/ML injection Inject 45-65 Units into the skin at bedtime. According to sugar level.   Yes [provider]  lidocaine-prilocaine (EMLA) cream Apply 1  application topically as needed (port access).   Yes [provider]  loperamide (IMODIUM) 1 MG/5ML solution Take 1-2 mg by mouth as needed for diarrhea or loose stools.   Yes [provider]  losartan (COZAAR) 100 MG tablet Take 100 mg by mouth daily.  02/19/18  Yes [provider]  magnesium oxide (MAG-OX) 400 MG tablet Take 400 mg by mouth at bedtime.    Yes [provider]  metoprolol (LOPRESSOR) 100 MG tablet Take 100 mg by mouth 2 (two) times daily.  12/07/11  Yes [provider]  Naphazoline-Glycerin (REDNESS RELIEF OP) Place 1 drop into both eyes daily as needed (redness/ dryness).   Yes [provider]  NITROSTAT 0.4 MG SL tablet Place 0.4 mg under the tongue every 5 (five) minutes as needed for chest pain.  06/01/13  Yes [provider]  Omega-3 Fatty Acids (FISH OIL) 1000 MG CAPS Take 2,000 mg by mouth 2 (two) times a day.   Yes [provider]  omeprazole (PRILOSEC) 20 MG capsule Take 20 mg by mouth 2 (two) times daily before a meal.   Yes [provider]  ondansetron (ZOFRAN) 4 MG tablet Take 4 mg by mouth every 8 (eight) hours as needed for nausea or vomiting.   Yes [provider]  oxyCODONE-acetaminophen (PERCOCET/ROXICET) 5-325 MG tablet Take 1 tablet by mouth every 6 (six) hours as needed. 01/21/19  Yes Ulyses Amor, PA-C  primidone (MYSOLINE) 50 MG tablet Take 50 mg by mouth at bedtime. 06/17/18  Yes [provider]  STIOLTO RESPIMAT 2.5-2.5 MCG/ACT AERS Inhale 2 puffs into the lungs at bedtime. 09/24/18  Yes [provider]   Liver Function Tests Recent Labs  Lab 01/24/19 1443  AST 42*  ALT 8  ALKPHOS 89  BILITOT 0.5  PROT 6.5  ALBUMIN 2.8*   No results for input(s): LIPASE, AMYLASE in the last 168 hours. CBC Recent Labs  Lab 01/21/19 0908 01/24/19 1443  WBC  --  13.1*  NEUTROABS  --  10.5*  HGB 11.2* 9.2*  HCT 33.0* 29.2*  MCV  --  91.0  PLT  --  546    Basic Metabolic Panel Recent Labs  Lab 01/21/19 0908 01/24/19 1443  NA 134* 136  K 3.7 3.9  CL  --  94*  CO2  --  19*  GLUCOSE 156* 131*  BUN  --  66*  CREATININE  --  12.21*  CALCIUM  --  7.2*   Iron/TIBC/Ferritin/ %Sat    Component Value Date/Time   IRON 37 (L) 07/07/2012 1407   TIBC 238 07/07/2012 1407   FERRITIN 146 07/07/2012 1407   IRONPCTSAT 16 (L) 07/07/2012 1407    Vitals:   01/24/19 1630 01/24/19 1645 01/24/19 1709 01/24/19 1809  BP: (!) 116/45 (!) 115/95 97/70 (!) 112/47  Pulse: 68 68 69 71  Resp: 14 13 (!) 21 18  Temp:    (!) 97.5 F (36.4 C)  TempSrc:    Oral  SpO2: 100% 100% 96% 100%  Weight:      Height:    5\' 11"  (1.803 m)    Exam Gen heavy set WM, drowsy and falling asleep during converstation no resp  distress No rash, cyanosis or gangrene Sclera anicteric, throat clear and moist  No jvd or bruits Chest clear bilat to bases, no rales or wheezing Ant chest R pericath erythema, no pus or drainage  RRR no MRG Abd soft ntnd no mass or ascites +bs obese GU normal male MS no joint effusions or deformity Ext no LE or UE edema, no wounds or ulcers Neuro is alert, Ox 3 , nf LUE AVF+bruit, +bruising throughout the upper arm, no signs infection    Home meds:  - amlodipine 10 qd/ losartan 100 qd/ hydrochlorothiazide 25 qd  - primidone 50 hs  - alprazolam 1 mg hs prn/ butalbital-aceta-caffeine prn qid ha / oxycodone-aceta prn qid  - isosorbide mononitrate 30 qd/ sl nitroglycerin prn/ omega-3 fatty acids 2 bid  - insulin glargine 45-65 qd/ insulin lispro 10 tid ac  - stiolto respimat 2.5-2.5 two puffs hs/ prn albuterol nebs  - prn's/ vitamins/ supplements   K 3.9 BUN 66  creat 12.2  Ca 7.2   alb 2.8   LFT's ok   WBC 13k  Hb 9    CXR 8/22  - possible L retrocardiac infiltrate vs atx, no edema   Assessment/ Plan: 1. Generalized weakness/ fever - r/o sepsis, cath sepsis, PNA LLL?Marland Kitchen Per primary, on empiric IV Vanc,and Rocephin x 1. Blood cx's  pending. TDC not grossly infected. L arm bruising postop, no sign of wound infection L arm.  2. ESRD - on HD MWF. Need to get OP HD records on Monday. Last HD Monday, missed Wed/ Fri. HD 1st rounds Sunday am.  3. Myoclonic jerking - could be medication-related, hypercarbia, uremia, or combination. Pt states is long-term issues suggesting hypercarbia. Plan HD in am.  4. DM2 on insulin 5. HTN/volume - BP's on the lower side, don't see any extra volume on exam. Has localized LUE edema. CXR clear.  Keep even on HD.  6. SP LUE AVF ligation - done in June       Kelly Splinter  MD 01/24/2019, 6:34 PM

## 2019-01-25 DIAGNOSIS — Z9115 Patient's noncompliance with renal dialysis: Secondary | ICD-10-CM

## 2019-01-25 DIAGNOSIS — Z794 Long term (current) use of insulin: Secondary | ICD-10-CM

## 2019-01-25 DIAGNOSIS — Z91048 Other nonmedicinal substance allergy status: Secondary | ICD-10-CM

## 2019-01-25 DIAGNOSIS — E1140 Type 2 diabetes mellitus with diabetic neuropathy, unspecified: Secondary | ICD-10-CM

## 2019-01-25 DIAGNOSIS — R509 Fever, unspecified: Secondary | ICD-10-CM

## 2019-01-25 DIAGNOSIS — N186 End stage renal disease: Secondary | ICD-10-CM

## 2019-01-25 DIAGNOSIS — R531 Weakness: Secondary | ICD-10-CM

## 2019-01-25 DIAGNOSIS — Z9981 Dependence on supplemental oxygen: Secondary | ICD-10-CM

## 2019-01-25 DIAGNOSIS — Z7951 Long term (current) use of inhaled steroids: Secondary | ICD-10-CM

## 2019-01-25 DIAGNOSIS — I2510 Atherosclerotic heart disease of native coronary artery without angina pectoris: Secondary | ICD-10-CM

## 2019-01-25 DIAGNOSIS — Z9009 Acquired absence of other part of head and neck: Secondary | ICD-10-CM

## 2019-01-25 DIAGNOSIS — J449 Chronic obstructive pulmonary disease, unspecified: Secondary | ICD-10-CM

## 2019-01-25 DIAGNOSIS — Z85828 Personal history of other malignant neoplasm of skin: Secondary | ICD-10-CM

## 2019-01-25 DIAGNOSIS — E1122 Type 2 diabetes mellitus with diabetic chronic kidney disease: Secondary | ICD-10-CM

## 2019-01-25 DIAGNOSIS — Z79899 Other long term (current) drug therapy: Secondary | ICD-10-CM

## 2019-01-25 DIAGNOSIS — E872 Acidosis: Secondary | ICD-10-CM

## 2019-01-25 DIAGNOSIS — Z992 Dependence on renal dialysis: Secondary | ICD-10-CM

## 2019-01-25 DIAGNOSIS — J189 Pneumonia, unspecified organism: Secondary | ICD-10-CM | POA: Diagnosis present

## 2019-01-25 DIAGNOSIS — I120 Hypertensive chronic kidney disease with stage 5 chronic kidney disease or end stage renal disease: Secondary | ICD-10-CM

## 2019-01-25 DIAGNOSIS — R0902 Hypoxemia: Secondary | ICD-10-CM

## 2019-01-25 LAB — RENAL FUNCTION PANEL
Albumin: 2.8 g/dL — ABNORMAL LOW (ref 3.5–5.0)
Albumin: 2.7 g/dL — ABNORMAL LOW (ref 3.5–5.0)
Anion gap: 26 — ABNORMAL HIGH (ref 5–15)
Anion gap: 19 — ABNORMAL HIGH (ref 5–15)
BUN: 78 mg/dL — ABNORMAL HIGH (ref 8–23)
BUN: 36 mg/dL — ABNORMAL HIGH (ref 8–23)
CO2: 17 mmol/L — ABNORMAL LOW (ref 22–32)
CO2: 23 mmol/L (ref 22–32)
Calcium: 7.1 mg/dL — ABNORMAL LOW (ref 8.9–10.3)
Calcium: 7.4 mg/dL — ABNORMAL LOW (ref 8.9–10.3)
Chloride: 93 mmol/L — ABNORMAL LOW (ref 98–111)
Chloride: 94 mmol/L — ABNORMAL LOW (ref 98–111)
Creatinine, Ser: 13.27 mg/dL — ABNORMAL HIGH (ref 0.61–1.24)
Creatinine, Ser: 8.21 mg/dL — ABNORMAL HIGH (ref 0.61–1.24)
GFR calc Af Amer: 4 mL/min — ABNORMAL LOW (ref >60)
GFR calc Af Amer: 7 mL/min — ABNORMAL LOW (ref >60)
GFR calc non Af Amer: 3 mL/min — ABNORMAL LOW (ref >60)
GFR calc non Af Amer: 6 mL/min — ABNORMAL LOW (ref >60)
Glucose, Bld: 154 mg/dL — ABNORMAL HIGH (ref 70–99)
Glucose, Bld: 146 mg/dL — ABNORMAL HIGH (ref 70–99)
Phosphorus: >30 mg/dL — ABNORMAL HIGH (ref 2.5–4.6)
Phosphorus: 6.6 mg/dL — ABNORMAL HIGH (ref 2.5–4.6)
Potassium: 4 mmol/L (ref 3.5–5.1)
Potassium: 3.9 mmol/L (ref 3.5–5.1)
Sodium: 136 mmol/L (ref 135–145)
Sodium: 136 mmol/L (ref 135–145)

## 2019-01-25 LAB — BLOOD GAS, ARTERIAL
Acid-base deficit: 1.1 mmol/L (ref 0.0–2.0)
Bicarbonate: 24.1 mmol/L (ref 20.0–28.0)
Drawn by: 244901
FIO2: 40
O2 Saturation: 88.5 %
Patient temperature: 99.5
pCO2 arterial: 49.3 mmHg — ABNORMAL HIGH (ref 32.0–48.0)
pH, Arterial: 7.314 — ABNORMAL LOW (ref 7.350–7.450)
pO2, Arterial: 65.7 mmHg — ABNORMAL LOW (ref 83.0–108.0)

## 2019-01-25 LAB — NOVEL CORONAVIRUS, NAA (HOSP ORDER, SEND-OUT TO REF LAB; TAT 18-24 HRS): SARS-CoV-2, NAA: NOT DETECTED

## 2019-01-25 LAB — BETA-HYDROXYBUTYRIC ACID: Beta-Hydroxybutyric Acid: 0.68 mmol/L — ABNORMAL HIGH (ref 0.05–0.27)

## 2019-01-25 LAB — CBC
HCT: 31.7 % — ABNORMAL LOW (ref 39.0–52.0)
Hemoglobin: 10 g/dL — ABNORMAL LOW (ref 13.0–17.0)
MCH: 28.2 pg (ref 26.0–34.0)
MCHC: 31.5 g/dL (ref 30.0–36.0)
MCV: 89.5 fL (ref 80.0–100.0)
Platelets: 282 10*3/uL (ref 150–400)
RBC: 3.54 MIL/uL — ABNORMAL LOW (ref 4.22–5.81)
RDW: 16.8 % — ABNORMAL HIGH (ref 11.5–15.5)
WBC: 11.8 10*3/uL — ABNORMAL HIGH (ref 4.0–10.5)
nRBC: 0 % (ref 0.0–0.2)

## 2019-01-25 LAB — GLUCOSE, CAPILLARY
Glucose-Capillary: 142 mg/dL — ABNORMAL HIGH (ref 70–99)
Glucose-Capillary: 128 mg/dL — ABNORMAL HIGH (ref 70–99)
Glucose-Capillary: 154 mg/dL — ABNORMAL HIGH (ref 70–99)
Glucose-Capillary: 157 mg/dL — ABNORMAL HIGH (ref 70–99)
Glucose-Capillary: 168 mg/dL — ABNORMAL HIGH (ref 70–99)

## 2019-01-25 LAB — MAGNESIUM: Magnesium: 1.5 mg/dL — ABNORMAL LOW (ref 1.7–2.4)

## 2019-01-25 LAB — CK: Total CK: 173 U/L (ref 49–397)

## 2019-01-25 LAB — HEPATITIS B SURFACE ANTIGEN: Hepatitis B Surface Ag: NEGATIVE

## 2019-01-25 MED ORDER — DIPHENHYDRAMINE-ZINC ACETATE 2-0.1 % EX CREA
TOPICAL_CREAM | Freq: Two times a day (BID) | CUTANEOUS | Status: DC | PRN
  Administered 2019-01-25: 22:00:00 via TOPICAL
  Filled 2019-01-25: qty 28

## 2019-01-25 MED ORDER — UMECLIDINIUM-VILANTEROL 62.5-25 MCG/INH IN AEPB
1.0000 | INHALATION_SPRAY | Freq: Every day | RESPIRATORY_TRACT | Status: DC
  Administered 2019-01-25: 1 via RESPIRATORY_TRACT
  Filled 2019-01-25: qty 14

## 2019-01-25 MED ORDER — VANCOMYCIN HCL IN DEXTROSE 1-5 GM/200ML-% IV SOLN
1000.0000 mg | Freq: Once | INTRAVENOUS | Status: AC
  Administered 2019-01-25: 11:00:00 1000 mg via INTRAVENOUS
  Filled 2019-01-25: qty 200

## 2019-01-25 MED ORDER — IPRATROPIUM-ALBUTEROL 0.5-2.5 (3) MG/3ML IN SOLN
RESPIRATORY_TRACT | Status: AC
  Filled 2019-01-25: qty 3

## 2019-01-25 MED ORDER — SODIUM CHLORIDE 0.9 % IV SOLN
1.0000 g | INTRAVENOUS | Status: DC
  Administered 2019-01-25 – 2019-01-29 (×5): 1 g via INTRAVENOUS
  Filled 2019-01-25 (×5): qty 10

## 2019-01-25 MED ORDER — ALTEPLASE 2 MG IJ SOLR
2.0000 mg | Freq: Once | INTRAMUSCULAR | Status: AC
  Administered 2019-01-25: 2 mg

## 2019-01-25 MED ORDER — HEPARIN SODIUM (PORCINE) 1000 UNIT/ML IJ SOLN
INTRAMUSCULAR | Status: AC
  Administered 2019-01-25: 1000 [IU]
  Filled 2019-01-25: qty 4

## 2019-01-25 MED ORDER — MAGNESIUM SULFATE 4 GM/100ML IV SOLN
4.0000 g | Freq: Once | INTRAVENOUS | Status: AC
  Administered 2019-01-25: 16:00:00 4 g via INTRAVENOUS
  Filled 2019-01-25: qty 100

## 2019-01-25 MED ORDER — UMECLIDINIUM-VILANTEROL 62.5-25 MCG/INH IN AEPB
1.0000 | INHALATION_SPRAY | Freq: Every day | RESPIRATORY_TRACT | Status: DC
  Filled 2019-01-25: qty 14

## 2019-01-25 MED ORDER — CHLORHEXIDINE GLUCONATE CLOTH 2 % EX PADS
6.0000 | MEDICATED_PAD | Freq: Every day | CUTANEOUS | Status: DC
  Administered 2019-01-26 – 2019-01-29 (×4): 6 via TOPICAL

## 2019-01-25 MED ORDER — IPRATROPIUM-ALBUTEROL 0.5-2.5 (3) MG/3ML IN SOLN
3.0000 mL | Freq: Four times a day (QID) | RESPIRATORY_TRACT | Status: DC
  Administered 2019-01-25 – 2019-01-26 (×3): 3 mL via RESPIRATORY_TRACT
  Filled 2019-01-25 (×2): qty 3

## 2019-01-25 MED ORDER — ONDANSETRON HCL 4 MG/2ML IJ SOLN
4.0000 mg | Freq: Once | INTRAMUSCULAR | Status: AC
  Administered 2019-01-25: 4 mg via INTRAVENOUS
  Filled 2019-01-25: qty 2

## 2019-01-25 MED ORDER — HEPARIN SODIUM (PORCINE) 1000 UNIT/ML DIALYSIS: 3500.0000 [IU] | INTRAMUSCULAR | Status: DC | PRN

## 2019-01-25 MED ORDER — DIPHENHYDRAMINE HCL 50 MG/ML IJ SOLN
25.0000 mg | Freq: Once | INTRAMUSCULAR | Status: AC
  Administered 2019-01-25: 25 mg via INTRAVENOUS
  Filled 2019-01-25: qty 1

## 2019-01-25 MED ORDER — VANCOMYCIN HCL IN DEXTROSE 1-5 GM/200ML-% IV SOLN
INTRAVENOUS | Status: AC
  Administered 2019-01-25: 1000 mg via INTRAVENOUS
  Filled 2019-01-25: qty 200

## 2019-01-25 MED ORDER — UMECLIDINIUM-VILANTEROL 62.5-25 MCG/INH IN AEPB: 1.0000 | INHALATION_SPRAY | Freq: Every day | RESPIRATORY_TRACT | Status: DC

## 2019-01-25 MED ORDER — DIPHENHYDRAMINE HCL 50 MG/ML IJ SOLN: 25.0000 mg | Freq: Two times a day (BID) | INTRAMUSCULAR | Status: DC

## 2019-01-25 MED ORDER — ALBUTEROL SULFATE HFA 108 (90 BASE) MCG/ACT IN AERS: 1.0000 | INHALATION_SPRAY | RESPIRATORY_TRACT | Status: DC | PRN

## 2019-01-25 MED ORDER — ALTEPLASE 2 MG IJ SOLR
INTRAMUSCULAR | Status: AC
  Administered 2019-01-25: 2 mg
  Filled 2019-01-25: qty 4

## 2019-01-25 NOTE — Progress Notes (Signed)
Paged MD to bedside as patient extremely lethargic after dialysis. Per pt daughter, " he is never this tired after dialysis, I have never seen him like this." Patient unable to answer orientation questions at this time, but was able to follow commands with persistent encouragement upon first assessment when I came on shift. ABG and breathing treatment by respiratory. CBG was recently 128. Pupils equal and reactive to light. SBP 108. HR 85, NSR with occasional PVC's.  Temp 99.5 axillary. O2 sats 91% on 4 liters. Patient using mask as, nose is absent. Pt bed weight 99.7 kg.   Lucius Conn, RN

## 2019-01-25 NOTE — Progress Notes (Signed)
Pt. HD catheter not functioning well, no blood return on arterial line and venous somewhat sluggish unable to run HD tx at ordered BFR. Catheter is reversed. Dr. Jonnie Finner aware. Orders received for cathflo for HD catheter

## 2019-01-25 NOTE — Progress Notes (Signed)
Came into patient room, patient appeared confused, had pulled the venturi off.  Check sats, they were in mid 28s.  I had found a face tent mask that the patient's wife had been asking about for the patient, hooked mask to aerosl setup on 5L at 28%.  Patient sats are now up to 82%, will continue to monitor patient and sats.

## 2019-01-25 NOTE — Progress Notes (Signed)
[facility]  Subjective  - Seen in dialysis.  [place] working.  No growth on blood cultures.   Objective (!) 125/55 87 99.1 F (37.3 C) (Axillary) (!) 24 98%  Intake/Output Summary (Last 24 hours) at 01/25/2019 1002 Last data filed at 01/25/2019 0500 Gross per 24 hour  Intake 891.05 ml  Output 0 ml  Net 891.05 ml    RIJ TDC - chest wall rash - not warm or overtly blanching - no purulent drainage Left arm basilic fistula with good thrill - bruising and eccymosis  Laboratory Lab Results: Recent Labs    01/24/19 1443 01/25/19 0528  WBC 13.1* 11.8*  HGB 9.2* 10.0*  HCT 29.2* 31.7*  PLT 276 282   BMET Recent Labs    01/24/19 1443 01/25/19 0528  NA 136 136  K 3.9 4.0  CL 94* 93*  CO2 19* 17*  GLUCOSE 131* 154*  BUN 66* 78*  CREATININE 12.21* 13.27*  CALCIUM 7.2* 7.1*    COAG Lab Results  Component Value Date   INR 1.1 01/21/2019   INR 1.0 11/05/2018   INR 1.0 10/29/2018   No results found for: PTT  Assessment/Planning:  78 year old male with multiple medical problems including end-stage renal disease that was admitted last night from the ED with fatigue globalized weakness confusion and a fever.  Looks like no growth on cultures.  His white count slightly improved today.  He was seen in dialysis today and still does not look like he feels well compared to how he looked last week.  Will allow primary team to complete his fever work-up.  If primary team feel strongly that his catheter needs to be removed we could certainly give him a line holiday but his wife states that his chest wall rash is unchanged and is related to a chronic tape allergy whenever he has catheter in place.  Has a nice thrill in the left arm fistula with expected bruising and ecchymosis that should improve with time.    Marty Heck 01/25/2019 10:02 AM --

## 2019-01-25 NOTE — Progress Notes (Signed)
  Date: 01/25/2019  Patient name: Thomas [name]  Medical record number: [id number]  Date of birth: [birth year]   I have seen and evaluated this patient and I have discussed the plan of care with the house staff. Please see their note for complete details. I concur with their findings with the following additions/corrections:   78 year old man with a history of ESRD on HD, COPD, CAD, DM, and rhinectomy admitted with fever, hypoxia, and generalized weakness.  Despite extensive work-up, no clear cause at this time.  He recently had his right IJ dialysis catheter exchanged on 01/21/2019 and he had a new left arm fistula placed.  Although he has extensive redness on his anterior chest wall, this is reportedly common for him and due to a tape allergy.  His left arm fistula has a good thrill present and appears to have normal postsurgical bruising but no signs of infection.  Blood cultures were sent on admission and show no growth so far.  His CXR did show vascular congestion and cardiomegaly, as well as an opacity in the left base which could represent atelectasis or pneumonia.  He has a mild leukocytosis to 13, down to 11.8 today, and we have started broad-spectrum antibiotics for possible pneumonia.  ABG today showed mild acidosis and CO2 retention, but likely not sufficient to cause his symptoms.  His phosphate was elevated to 30, discussed with Dr. Jonnie Finner, who suspects lab error and the primary treatment would be dialysis in any case.  He does seem to have significant volume overload with a large amount of fluid in his abdomen, suspect he may benefit from serial dialysis as he missed multiple sessions of dialysis recently due to weakness and other reasons.  Thomas [name], M.D., Ph.D. 01/25/2019, 6:12 PM

## 2019-01-25 NOTE — Progress Notes (Signed)
[facility] Progress Note  Subjective: on HD now no new c/o  Vitals:   01/25/19 0900 01/25/19 0930 01/25/19 1000 01/25/19 1030  BP: (!) 125/55 (!) 125/49 (!) 134/40 (!) 128/45  Pulse: 87 90 92 83  Resp:      Temp:      TempSrc:      SpO2:      Weight:      Height:        Inpatient medications: . alteplase  2 mg Intracatheter Once  . calcium acetate  1,334 mg Oral TID WC  . Chlorhexidine Gluconate Cloth  6 each Topical Q0600  . heparin  5,000 Units Subcutaneous Q8H  . insulin aspart  0-15 Units Subcutaneous TID WC  . insulin aspart  0-5 Units Subcutaneous QHS  . insulin aspart  4 Units Subcutaneous TID WC  . insulin glargine  10 Units Subcutaneous QHS  . Ipratropium-Albuterol  2 puff Inhalation Q12H  . metoprolol tartrate  25 mg Oral BID   . [START ON 01/26/2019] vancomycin    . vancomycin 1,000 mg (01/25/19 1047)   acetaminophen **OR** acetaminophen, calcium acetate, gabapentin    Exam: Gen heavy set WM, drowsy and falling asleep during converstation no resp distress No jvd or bruits Chest clear bilat to bases, no rales or wheezing Ant chest R pericath erythema, no pus or drainage  RRR no MRG Abd soft ntnd no mass or ascites +bs obese Ext no LE or UE edema Neuro is lethargic, arousable, myoclonic jerking may be a bit better today Ox 3 LUE AVF+bruit, +bruising     Home meds:  - amlodipine 10 qd/ losartan 100 qd/ hydrochlorothiazide 25 qd  - primidone 50 hs  - alprazolam 1 mg hs prn/ butalbital-aceta-caffeine prn qid ha / oxycodone-aceta prn qid  - isosorbide mononitrate 30 qd/ sl nitroglycerin prn/ omega-3 fatty acids 2 bid  - insulin glargine 45-65 qd/ insulin lispro 10 tid ac  - stiolto respimat 2.5-2.5 two puffs hs/ prn albuterol nebs  - prn's/ vitamins/ supplements   K 3.9 BUN 66  creat 12.2  Ca 7.2   alb 2.8   LFT's ok   WBC 13k  Hb 9    CXR 8/22  - possible L retrocardiac infiltrate vs atx, no edema   Assessment/ Plan: 1. Fever -  r/o sepsis, cath sepsis, PNA LLL?Marland Kitchen Blood cx's negative. Per primary, on empiric IV Vanc.. TDC not grossly infected. L arm bruising postop, no sign of wound infection L arm.  2. ESRD - on HD MWF. Need to get OP HD records on Monday. Goes to [facility]. Last HD was last week Monday, missed Wed/ Fri. HD tomorrow.  3. Myoclonic jerking/ lethargy- could be medication-related/ hypercarbia/ uremia/ other. Work up in progress have d/w primary team. Looks a little better after HD today.  4. DM2 on insulin 5. HTN/volume - BP's on the lower side, don't see any extra volume on exam. Has localized LUE edema. CXR clear.  Keep even on HD 6. SP LUE AVF revision - done here in June 2020 by VVS, should check w VVS on Monday to see when able to use    [facility] 01/25/2019, 11:10 AM  Iron/TIBC/Ferritin/ %Sat    Component Value Date/Time   IRON 37 (L) 07/07/2012 1407   TIBC 238 07/07/2012 1407   FERRITIN 146 07/07/2012 1407   IRONPCTSAT 16 (L) 07/07/2012 1407   Recent Labs  Lab 01/21/19 0915  01/25/19 6045  NA  --    < > 136  K  --    < > 4.0  CL  --    < > 93*  CO2  --    < > 17*  GLUCOSE  --    < > 154*  BUN  --    < > 78*  CREATININE  --    < > 13.27*  CALCIUM  --    < > 7.1*  PHOS  --   --  >30.0*  ALBUMIN  --    < > 2.8*  INR 1.1  --   --    < > = values in this interval not displayed.   Recent Labs  Lab 01/24/19 1443  AST 42*  ALT 8  ALKPHOS 89  BILITOT 0.5  PROT 6.5   Recent Labs  Lab 01/25/19 0528  WBC 11.8*  HGB 10.0*  HCT 31.7*  PLT 282

## 2019-01-25 NOTE — Progress Notes (Addendum)
Subjective: Patient evaluated at bedside this morning. He was in dialysis at time of evaluation. Patient appeared to be somnolent but responding to voice commands. He was on NRB mask but continued to experience increased work of breathing. Of note, he did not receive any COPD therapy overnight. Patient does not report any acute distress and reports that his weakness is slightly improved.    Objective:  Vital signs in last 24 hours: Vitals:   01/25/19 0743 01/25/19 0800 01/25/19 0830 01/25/19 0900  BP: (!) 108/55 (!) 126/51 129/60 (!) 125/55  Pulse: 82 81 89 87  Resp:      Temp:      TempSrc:      SpO2:      Weight:      Height:       Physical Exam Constitutional:      Appearance: He is obese. He is ill-appearing.  HENT:     Nose:     Comments: Patient s/p rhinectomy due to [facility] of nose  Cardiovascular:     Rate and Rhythm: Normal rate and regular rhythm.     Pulses: Normal pulses.     Heart sounds: Normal heart sounds. No murmur. No friction rub. No gallop.   Pulmonary:     Effort: Respiratory distress present.     Breath sounds: Wheezing present.     Comments: Increased work of breathing with inspiratory and expiratory wheezing  Abdominal:     General: Bowel sounds are normal. There is distension.     Tenderness: There is no abdominal tenderness. There is no guarding.  Skin:    General: Skin is warm and dry.  Neurological:     General: No focal deficit present.     Mental Status: He is oriented to person, place, and time.     Cranial Nerves: No cranial nerve deficit.     Motor: Weakness present.     Comments: Somnolent but responsive to voice commands       Assessment/Plan:  Patient is a 78 yo male with COPD, CAD, DM, SCC of nose d/p rhinectomy, ESRD on HD that presented with generalized weakness, fever and hypoxia from home.   Generalized Weakness in setting of AGMA:  Patient presenting with generalized weakness and subjective fever for 2 days. WBC 13.1,  neutrophil predominant. Lactic acid 0.9. He is afebrile but slightly hypotensive on admission. He received Rocephin 2g in ED. Patient had left arm basilic vein fistula placement and right IJ dialysis catheter insertion on 01/21/2019. He had an incomplete hemodialysis session on 01/22/2019 and was too weak to go to dialysis on Friday. He also complains of left arm pain at site of fistula. Healing surgical incisions on left arm surrounded by bruising and moderate swelling. No active drainage noted from incision sites. Fistula with good thrill. Per vascular surgery, no immediate surgical intervention needed at this time.  Patient on vancomycin. WBC improved 11.8 this AM.  Na 136, K 4.0, Cl 93, CO2 17, AG 26; BUN/Cr 78/13.27 ABG: pH 7.314, pCO2 49.3, pO2 65.7 Anion gap elevated in setting of metabolic acidosis. Patient is ESRD on HD with BUN/Cr 70/13.27. Could be due to uremia secondary to renal failure and missing HD.  CBG has been 116-142 so unlikely DKA.   - Vancomycin 1000mg  IV MWF  - F/u blood cultures - HD per Nephrology  - Continue to monitor vitals   ESRD on HD:  Patient with Hx of ESRD on HD MWF. Of note, patient did not receive  dialysis on Wednesday, had half a session on Thursday and no dialysis on Friday. Pt seen during HD session this morning. He is tolerating well.   - Continue calcium acetate  - HD per nephrology   COPD:  Patient has Hx of COPD with 3L home O2. Patient also uses albuterol daily and has daily nebulizer treatments. Also uses Stiolto respimat 2 puffs qHS. He has morning productive cough at baseline unchanged. Patient appeared to have increased work of breathing this morning on examination. He is tachypnic on NRB. Of note, patient did not receive combivent treatment overnight due to medication not being available and team not aware.  ABG: pH 7.314, pCO2 49.3, pO2 65.7  - Duoneb 82mL neb q6h, Anoro 1 puff qd  - Magnesium sulfate 4g - Continue to monitor respiratory  status  Hypertension:  Patient with Hx of HTN on amlodipine 10mg  qd, metoprolol 100mg  qd, HCTZ 25mg  qd, IMDUR 30mg  qd, and losartan 100mg  qd.   - Hold home BP meds given low BP   Type II DM:  Patient with Hx of type II DM. Patient on  Lantus 45-65U qHS + Humalog 10U tid.  Here, patient getting Lantus 10U + Novolog 4u tid + SSI. CBG 116-142.   - Lantus 10U + Novolog 4u tid + SSI - CBG monitoring   Tape allergy:  Diffuse erythema around catheter and upper right chest without warmth, pain or tenderness to area. Per daughter, this is chronic and patient requires benadryl for symptomatic control.   - Benadryl 25mg  q12h prn  Diabetic neuropathy: Patient has hx of diabetic neuropathy and is on gabapentin at home. On examination, gross sensation to touch is absent in bilateral lower extremities.  - Gabapentin 100mg  bid   DVT Prophylaxis: Heparin  Diet: Regular diet  Code: Full    Dispo: Anticipated discharge in approximately 2-3 day(s).   Harvie Heck, MD  Internal Medicine, PGY-1  01/25/2019, 9:54 AM Pager: [phone]

## 2019-01-25 NOTE — Progress Notes (Signed)
Paged to bedside that the patient was unresponsive. Upon arriving in the room the patient is awake and able to have a conversation however appears confused and altered. Daughter is at bedside but states this is not his baseline. Per nursing he did not receive any sedating medications. He has been this way since he returned from hemodialysis this morning  The patient is alert but slow to respond to questions and intermittently answers inappropriately. His vital signs are stable. He is currently on of venti mask with an O2 sat of approximately 92%. He has diffuse expiratory wheezing on physical exam. His abdomen is distended and firm. There is no lower extremity edema.  Recent ABG shows a metabolic acidosis. Although his PCO2 is elevated it is not proportional to his decrease in pH. His pO2 is consistent with his oxygen saturation; however, it does illustrate an elevated A-a gradient. He did complete hemodialysis today with only 500cc removed.  Plan: - Suspect his hypoxia with elevated A-a gradient is related to pulmonary edema.  - Repeat renal function panel to further assess for metabolic acidosis  - Breathing treatment - Replace Mg  - Check CBG - Will touch base with Nephrology   Will follow-up with labs. Please call if the patient's clinical status further deteriorates.   Ina Homes, MD  [facility] PGY3  Pager: [phone]

## 2019-01-26 DIAGNOSIS — E8770 Fluid overload, unspecified: Secondary | ICD-10-CM | POA: Diagnosis present

## 2019-01-26 LAB — CBC WITH DIFFERENTIAL/PLATELET
Abs Immature Granulocytes: 0.11 10*3/uL — ABNORMAL HIGH (ref 0.00–0.07)
Basophils Absolute: 0.1 10*3/uL (ref 0.0–0.1)
Basophils Relative: 1 %
Eosinophils Absolute: 0.4 10*3/uL (ref 0.0–0.5)
Eosinophils Relative: 4 %
HCT: 28.7 % — ABNORMAL LOW (ref 39.0–52.0)
Hemoglobin: 8.9 g/dL — ABNORMAL LOW (ref 13.0–17.0)
Immature Granulocytes: 1 %
Lymphocytes Relative: 11 %
Lymphs Abs: 1.3 10*3/uL (ref 0.7–4.0)
MCH: 28.6 pg (ref 26.0–34.0)
MCHC: 31 g/dL (ref 30.0–36.0)
MCV: 92.3 fL (ref 80.0–100.0)
Monocytes Absolute: 0.8 10*3/uL (ref 0.1–1.0)
Monocytes Relative: 7 %
Neutro Abs: 9 10*3/uL — ABNORMAL HIGH (ref 1.7–7.7)
Neutrophils Relative %: 76 %
Platelets: 253 10*3/uL (ref 150–400)
RBC: 3.11 MIL/uL — ABNORMAL LOW (ref 4.22–5.81)
RDW: 16.8 % — ABNORMAL HIGH (ref 11.5–15.5)
WBC: 11.6 10*3/uL — ABNORMAL HIGH (ref 4.0–10.5)
nRBC: 0 % (ref 0.0–0.2)

## 2019-01-26 LAB — RENAL FUNCTION PANEL
Albumin: 2.5 g/dL — ABNORMAL LOW (ref 3.5–5.0)
Anion gap: 17 — ABNORMAL HIGH (ref 5–15)
BUN: 50 mg/dL — ABNORMAL HIGH (ref 8–23)
CO2: 22 mmol/L (ref 22–32)
Calcium: 7.4 mg/dL — ABNORMAL LOW (ref 8.9–10.3)
Chloride: 98 mmol/L (ref 98–111)
Creatinine, Ser: 9.24 mg/dL — ABNORMAL HIGH (ref 0.61–1.24)
GFR calc Af Amer: 6 mL/min — ABNORMAL LOW (ref >60)
GFR calc non Af Amer: 5 mL/min — ABNORMAL LOW (ref >60)
Glucose, Bld: 127 mg/dL — ABNORMAL HIGH (ref 70–99)
Phosphorus: 8.4 mg/dL — ABNORMAL HIGH (ref 2.5–4.6)
Potassium: 4.2 mmol/L (ref 3.5–5.1)
Sodium: 137 mmol/L (ref 135–145)

## 2019-01-26 LAB — GLUCOSE, CAPILLARY
Glucose-Capillary: 117 mg/dL — ABNORMAL HIGH (ref 70–99)
Glucose-Capillary: 142 mg/dL — ABNORMAL HIGH (ref 70–99)
Glucose-Capillary: 158 mg/dL — ABNORMAL HIGH (ref 70–99)
Glucose-Capillary: 146 mg/dL — ABNORMAL HIGH (ref 70–99)

## 2019-01-26 LAB — IRON AND TIBC
Iron: 15 ug/dL — ABNORMAL LOW (ref 45–182)
Saturation Ratios: 10 % — ABNORMAL LOW (ref 17.9–39.5)
TIBC: 151 ug/dL — ABNORMAL LOW (ref 250–450)
UIBC: 136 ug/dL

## 2019-01-26 LAB — PROCALCITONIN: Procalcitonin: 3.73 ng/mL

## 2019-01-26 MED ORDER — HYDROXYZINE HCL 25 MG PO TABS
25.0000 mg | ORAL_TABLET | Freq: Once | ORAL | Status: AC
  Administered 2019-01-26: 25 mg via ORAL
  Filled 2019-01-26: qty 1

## 2019-01-26 MED ORDER — ALPRAZOLAM 0.5 MG PO TABS
0.5000 mg | ORAL_TABLET | Freq: Once | ORAL | Status: AC
  Administered 2019-01-26: 0.5 mg via ORAL
  Filled 2019-01-26: qty 1

## 2019-01-26 MED ORDER — DARBEPOETIN ALFA 60 MCG/0.3ML IJ SOSY
60.0000 ug | PREFILLED_SYRINGE | INTRAMUSCULAR | Status: DC
  Administered 2019-01-26: 60 ug via INTRAVENOUS

## 2019-01-26 MED ORDER — VANCOMYCIN HCL IN DEXTROSE 500-5 MG/100ML-% IV SOLN
INTRAVENOUS | Status: AC
  Administered 2019-01-26: 500 mg via INTRAVENOUS
  Filled 2019-01-26: qty 100

## 2019-01-26 MED ORDER — ALPRAZOLAM 0.5 MG PO TABS: 1.0000 mg | ORAL_TABLET | Freq: Once | ORAL | Status: DC

## 2019-01-26 MED ORDER — ONDANSETRON HCL 4 MG/2ML IJ SOLN
4.0000 mg | Freq: Four times a day (QID) | INTRAMUSCULAR | Status: DC
  Administered 2019-01-26 – 2019-01-27 (×3): 4 mg via INTRAVENOUS
  Filled 2019-01-26 (×3): qty 2

## 2019-01-26 MED ORDER — VANCOMYCIN HCL IN DEXTROSE 500-5 MG/100ML-% IV SOLN
500.0000 mg | INTRAVENOUS | Status: AC
  Administered 2019-01-26: 500 mg via INTRAVENOUS

## 2019-01-26 MED ORDER — IPRATROPIUM-ALBUTEROL 0.5-2.5 (3) MG/3ML IN SOLN: 3.0000 mL | Freq: Four times a day (QID) | RESPIRATORY_TRACT | Status: DC

## 2019-01-26 MED ORDER — VANCOMYCIN HCL IN DEXTROSE 1-5 GM/200ML-% IV SOLN
1000.0000 mg | INTRAVENOUS | Status: DC
  Filled 2019-01-26 (×2): qty 200

## 2019-01-26 MED ORDER — HYDROXYZINE HCL 25 MG PO TABS
ORAL_TABLET | ORAL | Status: AC
  Filled 2019-01-26: qty 1

## 2019-01-26 MED ORDER — DARBEPOETIN ALFA 60 MCG/0.3ML IJ SOSY
PREFILLED_SYRINGE | INTRAMUSCULAR | Status: AC
  Administered 2019-01-26: 60 ug via INTRAVENOUS
  Filled 2019-01-26: qty 0.3

## 2019-01-26 MED ORDER — IPRATROPIUM-ALBUTEROL 0.5-2.5 (3) MG/3ML IN SOLN
3.0000 mL | Freq: Four times a day (QID) | RESPIRATORY_TRACT | Status: DC
  Administered 2019-01-26 – 2019-01-27 (×4): 3 mL via RESPIRATORY_TRACT
  Filled 2019-01-26 (×4): qty 3

## 2019-01-26 MED ORDER — ONDANSETRON HCL 4 MG/2ML IJ SOLN
4.0000 mg | Freq: Once | INTRAMUSCULAR | Status: AC
  Administered 2019-01-26: 4 mg via INTRAVENOUS
  Filled 2019-01-26: qty 2

## 2019-01-26 MED ORDER — HEPARIN SODIUM (PORCINE) 1000 UNIT/ML IJ SOLN
INTRAMUSCULAR | Status: AC
  Filled 2019-01-26: qty 1

## 2019-01-26 NOTE — Progress Notes (Signed)
  Progress Note    01/26/2019 8:11 AM  Subjective:  Seen on hemodialysis   Vitals:   01/26/19 0454 01/26/19 0457  BP: (!) 130/53 (!) 166/73  Pulse: 77 77  Resp: 20 20  Temp: 98.5 F (36.9 C) 98.9 F (37.2 C)  SpO2: (!) 81% 94%   Physical Exam: Incisions:  L arm incisions c/d/i; ecchymosis but no firm hematoma Extremities:  Palpable thrill L arm fistula near antecubitum; grip strength intact L hand Abdomen:  Chest redness has softened in intensity  CBC    Component Value Date/Time   WBC 11.6 (H) 01/26/2019 0522   RBC 3.11 (L) 01/26/2019 0522   HGB 8.9 (L) 01/26/2019 0522   HCT 28.7 (L) 01/26/2019 0522   HCT 32 06/29/2012 1308   PLT 253 01/26/2019 0522   MCV 92.3 01/26/2019 0522   MCH 28.6 01/26/2019 0522   MCHC 31.0 01/26/2019 0522   RDW 16.8 (H) 01/26/2019 0522   LYMPHSABS 1.3 01/26/2019 0522   MONOABS 0.8 01/26/2019 0522   EOSABS 0.4 01/26/2019 0522   BASOSABS 0.1 01/26/2019 0522    BMET    Component Value Date/Time   NA 137 01/26/2019 0522   NA 137 06/30/2012 1305   K 4.2 01/26/2019 0522   K 4.1 06/30/2012 1305   CL 98 01/26/2019 0522   CL 106 06/30/2012 1305   CO2 22 01/26/2019 0522   CO2 24 06/30/2012 1305   GLUCOSE 127 (H) 01/26/2019 0522   BUN 50 (H) 01/26/2019 0522   BUN 62 (A) 06/30/2012 1305   CREATININE 9.24 (H) 01/26/2019 0522   CREATININE 4.09 (H) 07/23/2012 0941   CALCIUM 7.4 (L) 01/26/2019 0522   CALCIUM 8.1 06/30/2012 1305   GFRNONAA 5 (L) 01/26/2019 0522   GFRAA 6 (L) 01/26/2019 0522    INR    Component Value Date/Time   INR 1.1 01/21/2019 0915   INR 1.0 06/30/2012 1305     Intake/Output Summary (Last 24 hours) at 01/26/2019 0811 Last data filed at 01/25/2019 1827 Gross per 24 hour  Intake 200 ml  Output -150 ml  Net 350 ml     Assessment/Plan:  78 y.o. male is s/p R IJ TDC and L single stage basilic transposition  Patent fistula with palpable thrill; L grip strength intact Ecchymosis but no palpable hematoma L arm  Continue antibiotics for chest; no frank infection of R IJ TDC   Dagoberto Ligas, PA-C [facility] [phone] 01/26/2019 8:11 AM

## 2019-01-26 NOTE — Progress Notes (Signed)
  Progress Note    01/26/2019 2:49 PM   Subjective: Left upper arm discomfort  Vitals:   01/26/19 1313 01/26/19 1438  BP:  109/67  Pulse:  79  Resp:  20  Temp:  98.4 F (36.9 C)  SpO2: (!) 85% (!) 87%    Physical Exam: Awake alert oriented Right TDC in place has stable redness does not appear infected Left upper arm with swelling postoperative hematoma strong thrill Left hand well-perfused  CBC    Component Value Date/Time   WBC 11.6 (H) 01/26/2019 0522   RBC 3.11 (L) 01/26/2019 0522   HGB 8.9 (L) 01/26/2019 0522   HCT 28.7 (L) 01/26/2019 0522   HCT 32 06/29/2012 1308   PLT 253 01/26/2019 0522   MCV 92.3 01/26/2019 0522   MCH 28.6 01/26/2019 0522   MCHC 31.0 01/26/2019 0522   RDW 16.8 (H) 01/26/2019 0522   LYMPHSABS 1.3 01/26/2019 0522   MONOABS 0.8 01/26/2019 0522   EOSABS 0.4 01/26/2019 0522   BASOSABS 0.1 01/26/2019 0522    BMET    Component Value Date/Time   NA 137 01/26/2019 0522   NA 137 06/30/2012 1305   K 4.2 01/26/2019 0522   K 4.1 06/30/2012 1305   CL 98 01/26/2019 0522   CL 106 06/30/2012 1305   CO2 22 01/26/2019 0522   CO2 24 06/30/2012 1305   GLUCOSE 127 (H) 01/26/2019 0522   BUN 50 (H) 01/26/2019 0522   BUN 62 (A) 06/30/2012 1305   CREATININE 9.24 (H) 01/26/2019 0522   CREATININE 4.09 (H) 07/23/2012 0941   CALCIUM 7.4 (L) 01/26/2019 0522   CALCIUM 8.1 06/30/2012 1305   GFRNONAA 5 (L) 01/26/2019 0522   GFRAA 6 (L) 01/26/2019 0522    INR    Component Value Date/Time   INR 1.1 01/21/2019 0915   INR 1.0 06/30/2012 1305     Intake/Output Summary (Last 24 hours) at 01/26/2019 1449 Last data filed at 01/26/2019 1115 Gross per 24 hour  Intake 200 ml  Output 350 ml  Net -150 ml     Assessment/plan:  78 y.o. male is s/p [facility] placement on the right as well as left single stage basilic vein fistula creation on the left.  Does not appear infected at this time.  We will follow-up in a few weeks with dialysis duplex.  Thomas [name] C. Donzetta Matters,  MD [facility] [place] Office: [phone] Pager: [phone]  01/26/2019 2:49 PM

## 2019-01-26 NOTE — Progress Notes (Signed)
  Date: 01/26/2019  Patient name: Thomas [name]  Medical record number: [id number]  Date of birth: Oct 31, 1940   I have seen and evaluated this patient and I have discussed the plan of care with the house staff. Please see their note for complete details. I concur with their findings with the following additions/corrections:   Seen at dialysis this morning.  Much improved from yesterday.  He was alert and interactive, thinking seemed quite clear.  Respiratory status was improved, although still with some expiratory wheezing and prolonged expiratory phase.  Abdomen is still quite distended and firm, but nontender, no rebound or guarding.  I suspect much of his symptoms were related to missing dialysis and having shorten sessions prior to that.  No clear explanation for his fever at this point.  Procalcitonin is quite elevated at 3.7, even for an ESRD patient, this is suggestive of bacterial infection.  Given his symptoms, suspect pneumonia, we will continue antibiotics.  Lenice Pressman, M.D., Ph.D. 01/26/2019, 1:44 PM

## 2019-01-26 NOTE — Progress Notes (Signed)
Raven, RN called MD concerning patient.  I had placed patient on aerosol face tent, so his face would not be irritated by the masks we were using before, and because wife had stated that she would like for him to have that mask and that is what he uses at home.  Patient had pulled venturi off before and had de-sated down into 70s, I got patient up to 82% after putting him on aerosol face tent, but he kept fluctuating.  Asked RN about putting patient on contnuous pulse-ox due to alarm built in, Raven, RN ordered it and now, according to that machine, and according to the fact that I have turned patient up to 40% FIO2 and 10L, he is now sating 94%.  Will continue to monitor patient.

## 2019-01-26 NOTE — Progress Notes (Signed)
O2 sats of pt 86-90% on face tent 10L. Pt complained of nausea. MD came to eval pt.

## 2019-01-26 NOTE — Progress Notes (Signed)
Subjective: Patient evaluated this morning during hemodialysis session. He appears more comfortable and alert than yesterday. Appears more comfortable and alert than yesterday. He is on NRB mask. He does not report any acute distress and reports feeling better this morning.   On call team paged to bedside yesterday for AMS after hemodialysis with 565mL removed. Patient improved with breathing treatment. ABG: pH 7.3 pCO2 49, pO2 65. Renal function panel with bicarb 23, AG 19. BUN/Cr 36/8.2, Phos 6.6.. Mg 1.5 Patient started on breathing treatment and given magnesium. Improvement in symptoms.   Objective:  Vital signs in last 24 hours: Vitals:   01/26/19 0830 01/26/19 0900 01/26/19 0930 01/26/19 1000  BP: (!) 151/47 (!) 134/48 (!) 141/65 (!) 144/63  Pulse: 84 79 76 77  Resp: 20     Temp:      TempSrc:      SpO2:      Weight:      Height:       Physical Exam Constitutional:      General: He is not in acute distress.    Appearance: He is obese.  HENT:     Nose:     Comments: Patient s/p rhinectomy due to [facility] of nose  Cardiovascular:     Rate and Rhythm: Normal rate and regular rhythm.     Pulses: Normal pulses.     Heart sounds: Normal heart sounds. No murmur. No friction rub. No gallop.   Pulmonary:     Effort: Pulmonary effort is normal.     Breath sounds: Wheezing present.     Comments: Expiratory wheezing present  Abdominal:     General: Bowel sounds are normal. There is distension.     Tenderness: There is no abdominal tenderness. There is no guarding.  Skin:    General: Skin is warm.     Comments: Erythema on right chest around right IJ port improved from yesterday.   Erythema on left arm in area of fistula consistent with post op bruising but no warmth noted, no drainage noted from incisions   Neurological:     Mental Status: He is alert.      Assessment/Plan:  Patient is a 78 yo male with COPD, CAD, DM, SCC of nose d/p rhinectomy, ESRD on HD that presented  with generalized weakness, fever and hypoxia from home.   Generalized Weakness in setting of AGMA:  Patient presented with generalized weakness and subjective fever for 2 days. Neutrophil predominant leukocytosis on admission. He received Rocephin 2g in ED. Patient had left arm basilic vein fistula placement and right IJ dialysis catheter insertion on 01/21/2019. He had an incomplete hemodialysis session on 01/22/2019 and was too weak to go to dialysis on Friday. He also complains of left arm pain at site of fistula.Healing surgical incisionson left armsurrounded by appropriate post op bruising. No active drainage noted from incision sites.Fistula with good thrill.  Patient on vancomycin and Rocephin. WBC 11.6 this AM. Blood cultures with no growth to date. Unclear etiology.  Na 137, K 4.2, Cl 90, CO2 22, AG 17; BUN/Cr 50/9.24 ABG: pH 7.314, pCO2 49.3, pO2 65.7 Anion gap elevated in setting of metabolic acidosis. Patient is ESRD on HD with BUN/Cr 50/9.24. Could be due to uremia secondary to renal failure and missing HD. b-hydroxybuterate elevated at 0.68. Ketoacidosis may be result of urate and phosphate retention from ESRD. Patient on HD with improvement in symptoms. Will continue to monitor.   - Vancomycin 1000mg  IV MWF  - Procalcitonin  -  F/u blood cultures - HD per Nephrology  - Continue to monitor vitals   ESRD on HD:  Patient with Hx of ESRD on HD MWF. Of note, patient did not receive dialysis on Wednesday, had half a session on Thursday and no dialysis on Friday. Pt seen during HD session this morning. He is tolerating well.   - Continue calcium acetate  - HD per nephrology   COPD:  Patient has Hx of COPD with 3L home O2. Patient also uses albuterol daily and has daily nebulizer treatments. Also uses Stiolto respimat 2 puffs qHS. He has morning productive cough at baseline unchanged. Patient breathing improved on examination this morning and lung exam with expiratory wheezing  noted. Patient on NRB during exam. Overnight, patient used aeresol tent mask and saturating in upper 80s to low 90s with 10L 40% FiO2.  ABG: pH 7.314, pCO2 49.3, pO2 65.7  - Duoneb 74mL neb q6h, Anoro 1 puff qd  - Magnesium sulfate 4g - Continue to monitor respiratory status  Hypertension:  Patient with Hx of HTN on amlodipine 10mg  qd, metoprolol 100mg  qd, HCTZ 25mg  qd, IMDUR 30mg  qd, and losartan 100mg  qd.   - Hold home BP meds given low BP   Type II DM:  Patient with Hx of type II DM. Patient on Lantus 45-65U qHS + Humalog 10U tid.  Here, patient getting Lantus 10U + Novolog 4u tid + SSI. CBG 116-142.   - Lantus 10U + Novolog 4u tid + SSI - CBG monitoring   Tape allergy:  Diffuse erythema around catheter and upper right chest without warmth, pain or tenderness to area. Per daughter, this is chronic and patient requires benadryl for symptomatic control.   - Benadryl 25mg  q12h prn  Diabetic neuropathy: Patient has hx of diabetic neuropathy and is on gabapentin at home. On examination, gross sensation to touch is absent in bilateral lower extremities.  - Gabapentin 100mg  bid   DVT Prophylaxis: Heparin  Diet: Regular diet  Code: Full   Dispo: Anticipated discharge in approximately 2-3 day(s).   Harvie Heck, MD  Internal Medicine PGY-1 01/26/2019, 10:14 AM Pager: [phone]

## 2019-01-26 NOTE — Progress Notes (Signed)
Pt's BP is elevated 166/73. Paged MD no new orders at this time.

## 2019-01-26 NOTE — Progress Notes (Signed)
Pt keeps taking Venturi mask off and was switched to a 02 tent. Pt was sating at 81% on 5L and 28%. Paged MD for 02 goal. MD stated that they "wanted to keep the pt in the high 80s and low 90s." Pt was switched to a continuous pulse ox and sats came up to 90% on 5L and 28%. Paged MD for update.

## 2019-01-26 NOTE — Progress Notes (Signed)
[facility] ROUNDING NOTE   Subjective:   This is a 78 year old gentleman that is end-stage renal disease Monday Wednesday Friday dialysis goes to [facility].  He was admitted with fever rule out sepsis possible pneumonia blood cultures negative to date.  He had skipped 2 dialysis treatments last week.  He is receiving dialysis at this present time on 01/26/2019.  Blood pressure 151/47 pulse 84 temperature 98.9 O2 sats 94% room air  Sodium 137 potassium 4.2 chloride 98 CO2 22 BUN 50 creatinine 9.24 glucose 127 calcium 7.4 phosphorus 8.4 albumin 2.5 WBC 11.6 hemoglobin 8.9 platelets 253  Calcium acetate 1.334 g 3 times daily with meals, Lantus 10 units q. at bedtime, DuoNeb every 6 hours, metoprolol 25 mg twice daily, Anora Ellipta 1 puff daily  IV Rocephin 1 g every 24 hours IV vancomycin 1 g per pharmacy  Objective:  Vital signs in last 24 hours:  Temp:  [98.5 F (36.9 C)-100.6 F (38.1 C)] 98.9 F (37.2 C) (08/24 0457) Pulse Rate:  [74-94] 84 (08/24 0830) Resp:  [15-20] 20 (08/24 0830) BP: (106-166)/(40-73) 151/47 (08/24 0830) SpO2:  [81 %-98 %] 94 % (08/24 0457) FiO2 (%):  [30 %-40 %] 40 % (08/24 0134) Weight:  [99.3 kg-99.7 kg] 99.5 kg (08/24 0505)  Weight change: -1.191 kg Filed Weights   01/25/19 1113 01/25/19 1506 01/26/19 0505  Weight: 99.3 kg 99.7 kg 99.5 kg    Intake/Output: I/O last 3 completed shifts: In: 200 [IV Piggyback:200] Out: -150 [Emesis/NG output:350]   Intake/Output this shift:  No intake/output data recorded.  Genheavy set WM, drowsy and falling asleep during converstation no resp distress No jvd or bruits Chest clear bilatto bases, no rales or wheezing Ant chest R pericath erythema, no pus or drainage RRR no MRG Abd soft ntnd no mass or ascites +bsobese Extno LE or UEedema Neuro is lethargic, arousable, myoclonic jerking may be a bit better today Ox 3 LUE AVF+bruit, +bruising    Basic Metabolic Panel: Recent  Labs  Lab 01/21/19 0908  01/24/19 1443 01/25/19 0528 01/25/19 1451 01/26/19 0522  NA 134*  --  136 136 136 137  K 3.7  --  3.9 4.0 3.9 4.2  CL  --   --  94* 93* 94* 98  CO2  --   --  19* 17* 23 22  GLUCOSE 156*  --  131* 154* 146* 127*  BUN  --   --  66* 78* 36* 50*  CREATININE  --   --  12.21* 13.27* 8.21* 9.24*  CALCIUM  --    < > 7.2* 7.1* 7.4* 7.4*  MG  --   --   --  1.5*  --   --   PHOS  --   --   --  >30.0* 6.6* 8.4*   < > = values in this interval not displayed.    Liver Function Tests: Recent Labs  Lab 01/24/19 1443 01/25/19 0528 01/25/19 1451 01/26/19 0522  AST 42*  --   --   --   ALT 8  --   --   --   ALKPHOS 89  --   --   --   BILITOT 0.5  --   --   --   PROT 6.5  --   --   --   ALBUMIN 2.8* 2.8* 2.7* 2.5*   No results for input(s): LIPASE, AMYLASE in the last 168 hours. No results for input(s): AMMONIA in the last 168 hours.  CBC: Recent Labs  Lab 01/21/19 0908 01/24/19 1443 01/25/19 0528 01/26/19 0522  WBC  --  13.1* 11.8* 11.6*  NEUTROABS  --  10.5*  --  9.0*  HGB 11.2* 9.2* 10.0* 8.9*  HCT 33.0* 29.2* 31.7* 28.7*  MCV  --  91.0 89.5 92.3  PLT  --  276 282 253    Cardiac Enzymes: Recent Labs  Lab 01/25/19 0528  CKTOTAL 173    BNP: Invalid input(s): POCBNP  CBG: Recent Labs  Lab 01/25/19 1150 01/25/19 1446 01/25/19 1613 01/25/19 2159 01/26/19 0625  GLUCAP 128* 154* 157* 168* 117*    Microbiology: Results for orders placed or performed during the hospital encounter of 01/24/19  Blood Culture (routine x 2)     Status: None (Preliminary result)   Collection Time: 01/24/19  1:56 PM   Specimen: BLOOD  Result Value Ref Range Status   Specimen Description BLOOD RIGHT ANTECUBITAL  Final   Special Requests   Final    BOTTLES DRAWN AEROBIC AND ANAEROBIC Blood Culture adequate volume   Culture   Final    NO GROWTH < 24 HOURS Performed at [facility], [address] [place], [place] [address]    Report Status PENDING   Incomplete  Blood Culture (routine x 2)     Status: None (Preliminary result)   Collection Time: 01/24/19  2:01 PM   Specimen: BLOOD  Result Value Ref Range Status   Specimen Description BLOOD BLOOD RIGHT HAND  Final   Special Requests   Final    BOTTLES DRAWN AEROBIC AND ANAEROBIC Blood Culture adequate volume   Culture   Final    NO GROWTH < 24 HOURS Performed at [facility], [place] [address] [place], [place] [address]    Report Status PENDING  Incomplete  Novel Coronavirus, NAA (hospital order; send-out to ref lab)     Status: None   Collection Time: 01/24/19  2:43 PM  Result Value Ref Range Status   SARS-CoV-2, NAA NOT DETECTED NOT DETECTED Final    Comment: (NOTE) This test was developed and its performance characteristics determined by [employer]. This test has not been FDA cleared or approved. This test has been authorized by FDA under an Emergency Use Authorization (EUA). This test is only authorized for the duration of time the declaration that circumstances exist justifying the authorization of the emergency use of in vitro diagnostic tests for detection of SARS-CoV-2 virus and/or diagnosis of COVID-19 infection under section 564(b)(1) of the Act, 21 U.S.C. 706CBJ-6(E)(8), unless the authorization is terminated or revoked sooner. When diagnostic testing is negative, the possibility of a false negative result should be considered in the context of a patient's recent exposures and the presence of clinical signs and symptoms consistent with COVID-19. An individual without symptoms of COVID-19 and who is not shedding SARS-CoV-2 virus would expect to have a negative (not detected) result in this assay. Performed  At: [facility] [address] [place], [place] [id number] Rush Farmer MD VP:[id number]    [place]  Final    Comment: Performed at [facility], [place] [address], [place], [place] [address]     Coagulation Studies: No results for input(s): LABPROT, INR in the last 72 hours.  Urinalysis: No results for input(s): COLORURINE, LABSPEC, PHURINE, GLUCOSEU, HGBUR, BILIRUBINUR, KETONESUR, PROTEINUR, UROBILINOGEN, NITRITE, LEUKOCYTESUR in the last 72 hours.  Invalid input(s): APPERANCEUR    Imaging: Dg Chest Port 1 View  Result Date: 01/24/2019 CLINICAL  DATA:  Fever, weakness EXAM: PORTABLE CHEST 1 VIEW COMPARISON:  01/21/2019 FINDINGS: Right dialysis catheter in place, unchanged. Mild cardiomegaly and vascular congestion. Left base opacity, likely atelectasis. No overt edema or effusions. No acute bony abnormality. IMPRESSION: The megaly, vascular congestion. Left base opacity, likely atelectasis. Electronically Signed   By: Rolm Baptise M.D.   On: 01/24/2019 15:29     Medications:   . cefTRIAXone (ROCEPHIN)  IV Stopped (01/25/19 1558)  . vancomycin     . calcium acetate  1,334 mg Oral TID WC  . Chlorhexidine Gluconate Cloth  6 each Topical Q0600  . heparin      . heparin  5,000 Units Subcutaneous Q8H  . insulin aspart  0-15 Units Subcutaneous TID WC  . insulin aspart  0-5 Units Subcutaneous QHS  . insulin aspart  4 Units Subcutaneous TID WC  . insulin glargine  10 Units Subcutaneous QHS  . ipratropium-albuterol  3 mL Nebulization Q6H  . ipratropium-albuterol  3 mL Nebulization Q6H  . metoprolol tartrate  25 mg Oral BID  . umeclidinium-vilanterol  1 puff Inhalation Daily   acetaminophen **OR** acetaminophen, calcium acetate, diphenhydrAMINE-zinc acetate, gabapentin  Assessment/ Plan:   End-stage renal disease Monday Wednesday Friday dialysis had skipped 2 dialysis treatments at the end of last week he attends [facility] dialysis clinic.  He is seen on dialysis 01/26/2019 with no complaints  Febrile illness T-max 100.6 last 24 hours continues on Rocephin 1 g daily and vancomycin per pharmacy.  No pathogens to date  COPD continues inhaler therapy  Hypertension  appears to be adequately controlled  Bone mineral continues on calcium acetate 2 tablets 3 times daily with meals.  Will check PTH  Diabetes mellitus as per primary team  Anemia we will dose with darbepoetin 60 mcg we will check iron studies.   LOS: 2 Sherril Croon @TODAY @8 :54 AM

## 2019-01-26 NOTE — Progress Notes (Signed)
Pharmacy Antibiotic Note  Thomas [name] is a 78 y.o. male admitted on 01/24/2019 with cellulitis.  Pharmacy has been consulted for Vancomycin dosing.   The patient is ESRD normally MWF however received an off-schedule session on Sun, 8/23 with a reduced BFR of 200 ml/min for 3.5 hours. Will reduce the post-HD Vancomycin dose today and will resume normal dosing s/p HD on Wednesday, 8/26.   Plan: - Reduce Vancomycin to 500 mg post HD-Mon x 1 dose on 8/24 - Resume Vancomycin 1g/HD-MWF starting on Wed, 8/26 - Rocephin per MD - Will continue to follow HD schedule/duration, culture results, LOT, and antibiotic de-escalation plans    Height: 5\' 11"  (180.3 cm) Weight: 219 lb 5.7 oz (99.5 kg) IBW/kg (Calculated) : 75.3  Temp (24hrs), Avg:99.4 F (37.4 C), Min:98.5 F (36.9 C), Max:100.6 F (38.1 C)  Recent Labs  Lab 01/24/19 1443 01/25/19 0528 01/25/19 1451 01/26/19 0522  WBC 13.1* 11.8*  --  11.6*  CREATININE 12.21* 13.27* 8.21* 9.24*  LATICACIDVEN 0.9  --   --   --     Estimated Creatinine Clearance: 7.9 mL/min (A) (by C-G formula based on SCr of 9.24 mg/dL (H)).    Allergies  Allergen Reactions  . Ace Inhibitors Anaphylaxis    Swelling in throat and neck  . Statins Anaphylaxis and Swelling    Caused feet, tongue and throat to swell.  . Codeine Other (See Comments)    Muscle Spasms    Antimicrobials this admission: Vanc 8/22>> CTX  8/22>>  Dose adjustments this admission: N/A  Microbiology results: 8/22 Bcx: ngtd 8/22 COVID >> neg  Thank you for allowing pharmacy to be a part of this patient's care.  Alycia Rossetti, PharmD, BCPS Clinical Pharmacist Clinical phone for 01/26/2019: [phone] 01/26/2019 9:48 AM   **Pharmacist phone directory can now be found on [email or web address] ([email or web address]).  Listed under [facility].

## 2019-01-26 NOTE — Progress Notes (Signed)
Pt back on the unit from HD. AOx4. RN offered to order his lunch tray. Pt refused.

## 2019-01-26 NOTE — Progress Notes (Signed)
Paged by patient's nurse for O2 sats of 86-90% on face tent 10L, requesting progressive care for patient. Assessed at bedside. Face tent was not in place - he was holding it near his face due to high flow oxygenation irritating his face. Placed face tent back on face, encouraged to take deep breaths. Improvement in O2 sats to high 80s - low 90s. Patient remains AOx4, lung sounds unchanged from bedside evaluation this morning. He complains of nausea which could be contributing to the low sats. Zofran ordered.

## 2019-01-27 LAB — CBC WITH DIFFERENTIAL/PLATELET
Abs Immature Granulocytes: 0.37 10*3/uL — ABNORMAL HIGH (ref 0.00–0.07)
Basophils Absolute: 0.1 10*3/uL (ref 0.0–0.1)
Basophils Relative: 1 %
Eosinophils Absolute: 0.2 10*3/uL (ref 0.0–0.5)
Eosinophils Relative: 2 %
HCT: 28.8 % — ABNORMAL LOW (ref 39.0–52.0)
Hemoglobin: 9 g/dL — ABNORMAL LOW (ref 13.0–17.0)
Immature Granulocytes: 4 %
Lymphocytes Relative: 7 %
Lymphs Abs: 0.7 10*3/uL (ref 0.7–4.0)
MCH: 28.7 pg (ref 26.0–34.0)
MCHC: 31.3 g/dL (ref 30.0–36.0)
MCV: 91.7 fL (ref 80.0–100.0)
Monocytes Absolute: 0.6 10*3/uL (ref 0.1–1.0)
Monocytes Relative: 6 %
Neutro Abs: 7.8 10*3/uL — ABNORMAL HIGH (ref 1.7–7.7)
Neutrophils Relative %: 80 %
Platelets: 261 10*3/uL (ref 150–400)
RBC: 3.14 MIL/uL — ABNORMAL LOW (ref 4.22–5.81)
RDW: 16.3 % — ABNORMAL HIGH (ref 11.5–15.5)
WBC: 9.6 10*3/uL (ref 4.0–10.5)
nRBC: 0.2 % (ref 0.0–0.2)

## 2019-01-27 LAB — GLUCOSE, CAPILLARY
Glucose-Capillary: 197 mg/dL — ABNORMAL HIGH (ref 70–99)
Glucose-Capillary: 169 mg/dL — ABNORMAL HIGH (ref 70–99)
Glucose-Capillary: 184 mg/dL — ABNORMAL HIGH (ref 70–99)
Glucose-Capillary: 143 mg/dL — ABNORMAL HIGH (ref 70–99)

## 2019-01-27 LAB — PARATHYROID HORMONE, INTACT (NO CA): PTH: 218 pg/mL — ABNORMAL HIGH (ref 15–65)

## 2019-01-27 LAB — RENAL FUNCTION PANEL
Albumin: 2.5 g/dL — ABNORMAL LOW (ref 3.5–5.0)
Anion gap: 19 — ABNORMAL HIGH (ref 5–15)
BUN: 35 mg/dL — ABNORMAL HIGH (ref 8–23)
CO2: 22 mmol/L (ref 22–32)
Calcium: 8.3 mg/dL — ABNORMAL LOW (ref 8.9–10.3)
Chloride: 94 mmol/L — ABNORMAL LOW (ref 98–111)
Creatinine, Ser: 6.33 mg/dL — ABNORMAL HIGH (ref 0.61–1.24)
GFR calc Af Amer: 9 mL/min — ABNORMAL LOW (ref >60)
GFR calc non Af Amer: 8 mL/min — ABNORMAL LOW (ref >60)
Glucose, Bld: 179 mg/dL — ABNORMAL HIGH (ref 70–99)
Phosphorus: 7.6 mg/dL — ABNORMAL HIGH (ref 2.5–4.6)
Potassium: 4.2 mmol/L (ref 3.5–5.1)
Sodium: 135 mmol/L (ref 135–145)

## 2019-01-27 LAB — PROCALCITONIN: Procalcitonin: 4.48 ng/mL

## 2019-01-27 MED ORDER — PRO-STAT SUGAR FREE PO LIQD
30.0000 mL | Freq: Three times a day (TID) | ORAL | Status: DC
  Administered 2019-01-27 – 2019-01-30 (×3): 30 mL via ORAL
  Filled 2019-01-27 (×5): qty 30

## 2019-01-27 MED ORDER — ALBUTEROL SULFATE (2.5 MG/3ML) 0.083% IN NEBU: 2.5000 mg | INHALATION_SOLUTION | RESPIRATORY_TRACT | Status: DC | PRN

## 2019-01-27 MED ORDER — SODIUM CHLORIDE 0.9 % IV SOLN
8.0000 mg | Freq: Four times a day (QID) | INTRAVENOUS | Status: DC
  Administered 2019-01-27: 8 mg via INTRAVENOUS
  Filled 2019-01-27 (×10): qty 4

## 2019-01-27 MED ORDER — CHLORHEXIDINE GLUCONATE CLOTH 2 % EX PADS
6.0000 | MEDICATED_PAD | Freq: Every day | CUTANEOUS | Status: DC
  Administered 2019-01-27 – 2019-01-29 (×3): 6 via TOPICAL

## 2019-01-27 MED ORDER — CALCIUM ACETATE (PHOS BINDER) 667 MG PO CAPS: 2001.0000 mg | ORAL_CAPSULE | Freq: Three times a day (TID) | ORAL | Status: DC | PRN

## 2019-01-27 MED ORDER — PROMETHAZINE HCL 25 MG/ML IJ SOLN
12.5000 mg | Freq: Once | INTRAMUSCULAR | Status: AC
  Administered 2019-01-27: 04:00:00 12.5 mg via INTRAVENOUS
  Filled 2019-01-27: qty 1

## 2019-01-27 MED ORDER — IPRATROPIUM-ALBUTEROL 0.5-2.5 (3) MG/3ML IN SOLN
3.0000 mL | Freq: Two times a day (BID) | RESPIRATORY_TRACT | Status: DC
  Administered 2019-01-27 – 2019-01-29 (×5): 3 mL via RESPIRATORY_TRACT
  Filled 2019-01-27 (×5): qty 3

## 2019-01-27 MED ORDER — RENA-VITE PO TABS
1.0000 | ORAL_TABLET | Freq: Every day | ORAL | Status: DC
  Administered 2019-01-27 – 2019-01-29 (×3): 1 via ORAL
  Filled 2019-01-27 (×3): qty 1

## 2019-01-27 MED ORDER — SODIUM CHLORIDE 0.9 % IV SOLN
125.0000 mg | INTRAVENOUS | Status: DC
  Administered 2019-01-28 – 2019-01-30 (×2): 125 mg via INTRAVENOUS
  Filled 2019-01-27 (×3): qty 10

## 2019-01-27 MED ORDER — ONDANSETRON HCL 4 MG/2ML IJ SOLN
4.0000 mg | INTRAMUSCULAR | Status: AC
  Administered 2019-01-27: 4 mg via INTRAVENOUS
  Filled 2019-01-27: qty 2

## 2019-01-27 MED ORDER — PROMETHAZINE HCL 25 MG/ML IJ SOLN
12.5000 mg | Freq: Four times a day (QID) | INTRAMUSCULAR | Status: DC | PRN
  Administered 2019-01-27 – 2019-01-29 (×5): 12.5 mg via INTRAVENOUS
  Filled 2019-01-27 (×5): qty 1

## 2019-01-27 NOTE — Progress Notes (Signed)
Initial Nutrition Assessment  DOCUMENTATION CODES:   Not applicable  INTERVENTION:   -Renal MVI daily -30 ml Prostat TID, each supplement provides 100 kcals and 15 grams protein -Magic cup TID with meals, each supplement provides 290 kcal and 9 grams of protein  NUTRITION DIAGNOSIS:   Increased nutrient needs related to chronic illness(ESRD on HD) as evidenced by estimated needs.  GOAL:   Patient will meet greater than or equal to 90% of their needs  MONITOR:   PO intake, Supplement acceptance, Labs, Weight trends, Skin, I & O's  REASON FOR ASSESSMENT:   Low Braden    ASSESSMENT:   78 y.o. yo male w/ PMH significant for T2DM, ESRD on HD MWF, COPD on 3L O2, CAD s/p PCI x5, HTN, and HLD presenting with generalized weakness and fever 100-101 for 2 days. History is obtained by patient and his daughter who is at bedside. She reports that he had left arm fistula placement and right IJ dialysis catheter insertion on 01/21/2019. He had an incomplete hemodialysis session on 01/22/2019 and was too weak to go to dialysis on Friday. He also complains of left arm pain at site of fistula. He denies any headaches, dizziness, abdominal pain, nausea/vomiting, diarrhea, worsening cough, focal weakness or sensory changes, chest pain, or known sick contacts.  Pt admitted with generalized weakness in the setting of uremia due to missed HD.   Reviewed I/O's: -50 ml x 24 hours and +1.2 L since admission  UOP: 150 ml x 24 hours  Pt in with MD at time of visit. Unable to obtain further nutrition history or complete nutrition-focused physical exam at this time.   Per wt hx, wt has been stable.   Noted meal completion 0%. Pt has been having low O2 saturations. Pt would greatly benefit from nutritional supplements.   Labs reviewed: CBGS: 146-158 (inpatient orders for glycemic control are 0-15 units insulin aspart TID with meals, 0-5 units insulin aspart q HS, 4 units insulin aspart TID with meals, and  10 units insulin glargine q HS).   Diet Order:   Diet Order            Diet renal with fluid restriction Fluid restriction: 1200 mL Fluid; Room service appropriate? Yes; Fluid consistency: Thin  Diet effective now              EDUCATION NEEDS:   No education needs have been identified at this time  Skin:  Skin Assessment: Skin Integrity Issues: Skin Integrity Issues:: Incisions Incisions: lt arm, rt chest  Last BM:  01/25/19  Height:   Ht Readings from Last 1 Encounters:  01/24/19 5\' 11"  (1.803 m)    Weight:   Wt Readings from Last 1 Encounters:  01/27/19 97 kg    Ideal Body Weight:  78.2 kg  BMI:  Body mass index is 29.83 kg/m.  Estimated Nutritional Needs:   Kcal:  2200-2400  Protein:  105-120 grams  Fluid:  1000 ml + UOP    Claron Rosencrans A. Jimmye Norman, RD, LDN, [place] Registered Dietitian II Certified Diabetes Care and Education Specialist Pager: [phone] After hours Pager: [phone]

## 2019-01-27 NOTE — Progress Notes (Signed)
[facility] ROUNDING NOTE   Subjective:   This is a 78 year old gentleman that is end-stage renal disease Monday Wednesday Friday dialysis goes to [facility].  He was admitted with fever rule out sepsis possible pneumonia blood cultures negative to date.  He had skipped 2 dialysis treatments last week.  He received his dialysis 01/26/2019.  No net ultrafiltration.  Blood pressure 161/63 pulse 86 temperature 98.8 O2 sats 95% face tent  Sodium 135 potassium 4.2 chloride 94 CO2 22 BUN 35 creatinine 6.33 glucose 179.  Phosphorus 7.6 calcium 8.3 albumin 2.3 iron saturations 10%.  WBC 19.6 hemoglobin 9.0 platelets 261  Calcium acetate 1.334 g 3 times daily with meals, Lantus 10 units q. at bedtime, DuoNeb every 6 hours, metoprolol 25 mg twice daily, Anora Ellipta 1 puff daily.  Darbepoetin 60 mcg administered 01/26/2019  IV Rocephin 1 g every 24 hours IV vancomycin 1 g per pharmacy  Objective:  Vital signs in last 24 hours:  Temp:  [97.9 F (36.6 C)-98.8 F (37.1 C)] 98.8 F (37.1 C) (08/25 0452) Pulse Rate:  [69-86] 86 (08/25 0452) Resp:  [16-20] 17 (08/25 0452) BP: (109-166)/(47-82) 161/63 (08/25 0452) SpO2:  [84 %-98 %] 95 % (08/25 0722) FiO2 (%):  [35 %-60 %] 40 % (08/25 0722) Weight:  [96.9 kg-97 kg] 97 kg (08/25 0452)  Weight change: -1.7 kg Filed Weights   01/26/19 0708 01/26/19 1136 01/27/19 0452  Weight: 96.9 kg 96.9 kg 97 kg    Intake/Output: I/O last 3 completed shifts: In: 100 [P.O.:100] Out: 150 [Urine:150]   Intake/Output this shift:  No intake/output data recorded.  Genheavy set WM, drowsy and falling asleep during converstation no resp distress No jvd or bruits Chest clear bilatto bases, no rales or wheezing Ant chest R pericath erythema, no pus or drainage RRR no MRG Abd soft ntnd no mass or ascites +bsobese Extno LE or UEedema Neuro is lethargic, arousable, myoclonic jerking may be a bit better today Ox 3 LUE AVF+bruit,  +bruising    Basic Metabolic Panel: Recent Labs  Lab 01/24/19 1443 01/25/19 0528 01/25/19 1451 01/26/19 0522 01/27/19 0431  NA 136 136 136 137 135  K 3.9 4.0 3.9 4.2 4.2  CL 94* 93* 94* 98 94*  CO2 19* 17* 23 22 22   GLUCOSE 131* 154* 146* 127* 179*  BUN 66* 78* 36* 50* 35*  CREATININE 12.21* 13.27* 8.21* 9.24* 6.33*  CALCIUM 7.2* 7.1* 7.4* 7.4* 8.3*  MG  --  1.5*  --   --   --   PHOS  --  >30.0* 6.6* 8.4* 7.6*    Liver Function Tests: Recent Labs  Lab 01/24/19 1443 01/25/19 0528 01/25/19 1451 01/26/19 0522 01/27/19 0431  AST 42*  --   --   --   --   ALT 8  --   --   --   --   ALKPHOS 89  --   --   --   --   BILITOT 0.5  --   --   --   --   PROT 6.5  --   --   --   --   ALBUMIN 2.8* 2.8* 2.7* 2.5* 2.5*   No results for input(s): LIPASE, AMYLASE in the last 168 hours. No results for input(s): AMMONIA in the last 168 hours.  CBC: Recent Labs  Lab 01/21/19 0908 01/24/19 1443 01/25/19 0528 01/26/19 0522 01/27/19 0431  WBC  --  13.1* 11.8* 11.6* 9.6  NEUTROABS  --  10.5*  --  9.0* 7.8*  HGB 11.2* 9.2* 10.0* 8.9* 9.0*  HCT 33.0* 29.2* 31.7* 28.7* 28.8*  MCV  --  91.0 89.5 92.3 91.7  PLT  --  276 282 253 261    Cardiac Enzymes: Recent Labs  Lab 01/25/19 0528  CKTOTAL 173    BNP: Invalid input(s): POCBNP  CBG: Recent Labs  Lab 01/26/19 0625 01/26/19 1210 01/26/19 1609 01/26/19 2101 01/27/19 0721  GLUCAP 117* 142* 158* 146* 197*    Microbiology: Results for orders placed or performed during the hospital encounter of 01/24/19  Blood Culture (routine x 2)     Status: None (Preliminary result)   Collection Time: 01/24/19  1:56 PM   Specimen: BLOOD  Result Value Ref Range Status   Specimen Description BLOOD RIGHT ANTECUBITAL  Final   Special Requests   Final    BOTTLES DRAWN AEROBIC AND ANAEROBIC Blood Culture adequate volume   Culture   Final    NO GROWTH 2 DAYS Performed at [facility], [place] [address] [place], [place] [address]     Report Status PENDING  Incomplete  Blood Culture (routine x 2)     Status: None (Preliminary result)   Collection Time: 01/24/19  2:01 PM   Specimen: BLOOD  Result Value Ref Range Status   Specimen Description BLOOD BLOOD RIGHT HAND  Final   Special Requests   Final    BOTTLES DRAWN AEROBIC AND ANAEROBIC Blood Culture adequate volume   Culture   Final    NO GROWTH 2 DAYS Performed at [facility], [place] [address], [place], [place] [address]    Report Status PENDING  Incomplete  Novel Coronavirus, NAA (hospital order; send-out to ref lab)     Status: None   Collection Time: 01/24/19  2:43 PM  Result Value Ref Range Status   SARS-CoV-2, NAA NOT DETECTED NOT DETECTED Final    Comment: (NOTE) This test was developed and its performance characteristics determined by [employer]. This test has not been FDA cleared or approved. This test has been authorized by FDA under an Emergency Use Authorization (EUA). This test is only authorized for the duration of time the declaration that circumstances exist justifying the authorization of the emergency use of in vitro diagnostic tests for detection of SARS-CoV-2 virus and/or diagnosis of COVID-19 infection under section 564(b)(1) of the Act, 21 U.S.C. 854OEV-0(J)(5), unless the authorization is terminated or revoked sooner. When diagnostic testing is negative, the possibility of a false negative result should be considered in the context of a patient's recent exposures and the presence of clinical signs and symptoms consistent with COVID-19. An individual without symptoms of COVID-19 and who is not shedding SARS-CoV-2 virus would expect to have a negative (not detected) result in this assay. Performed  At: [facility] [address] [place], [place] [id number] Rush Farmer MD HB:[id number]    [place]  Final    Comment: Performed at [facility], [place] [address] [place], [place]  [address]    Coagulation Studies: No results for input(s): LABPROT, INR in the last 72 hours.  Urinalysis: No results for input(s): COLORURINE, LABSPEC, PHURINE, GLUCOSEU, HGBUR, BILIRUBINUR, KETONESUR, PROTEINUR, UROBILINOGEN, NITRITE, LEUKOCYTESUR in the last 72 hours.  Invalid input(s): APPERANCEUR    Imaging: No results found.   Medications:   . cefTRIAXone (ROCEPHIN)  IV 1 g (01/26/19 1522)  . [START ON 01/28/2019] vancomycin     . calcium acetate  1,334 mg  Oral TID WC  . Chlorhexidine Gluconate Cloth  6 each Topical Q0600  . darbepoetin (ARANESP) injection - DIALYSIS  60 mcg Intravenous Q Mon-HD  . heparin  5,000 Units Subcutaneous Q8H  . insulin aspart  0-15 Units Subcutaneous TID WC  . insulin aspart  0-5 Units Subcutaneous QHS  . insulin aspart  4 Units Subcutaneous TID WC  . insulin glargine  10 Units Subcutaneous QHS  . ipratropium-albuterol  3 mL Nebulization Q6H  . metoprolol tartrate  25 mg Oral BID  . ondansetron (ZOFRAN) IV  4 mg Intravenous Q6H   acetaminophen **OR** acetaminophen, calcium acetate, diphenhydrAMINE-zinc acetate, gabapentin  Assessment/ Plan:   End-stage renal disease Monday Wednesday Friday dialysis had skipped 2 dialysis treatments at the end of last week he attends [facility] dialysis clinic.   Completed a successful dialysis treatment 01/26/2019 we will aim for dialysis  01/28/2019  Febrile illness T-max 100.6 last 24 hours continues on Rocephin 1 g daily and vancomycin per pharmacy.  No pathogens to date  COPD continues inhaler therapy  Hypertension we will add amlodipine 5 mg nightly  Bone mineral PTH 218 01/26/2019   we will increase calcium acetate to 3 with meals    Diabetes mellitus as per primary team  Anemia we will dose with darbepoetin 60 mcg will add IV iron x5 doses   LOS: 3 Sherril Croon @TODAY @8 :12 AM

## 2019-01-27 NOTE — Progress Notes (Signed)
Subjective: Patient examined at bedside this morning. He reports continued nausea despite receiving Phenergan lat night. He is on aeresol vent and tolerating well. He is not having any shortness of breath but does have abdominal pain and distention. He had a bowel movement overnight.   Objective:  Vital signs in last 24 hours: Vitals:   01/26/19 2228 01/27/19 0045 01/27/19 0452 01/27/19 0722  BP: (!) 151/65 (!) 166/57 (!) 161/63   Pulse: 83 84 86   Resp:  16 17   Temp:  98.4 F (36.9 C) 98.8 F (37.1 C)   TempSrc:  Oral Oral   SpO2: (!) 88% 92% 96% 95%  Weight:   97 kg   Height:       Physical Exam Constitutional:      Appearance: He is obese. He is ill-appearing.  Cardiovascular:     Rate and Rhythm: Normal rate and regular rhythm.     Heart sounds: No murmur. No friction rub. No gallop.   Pulmonary:     Effort: Pulmonary effort is normal.     Comments: Expiratory wheezing on auscultation  Abdominal:     General: Bowel sounds are normal. There is distension.     Tenderness: There is abdominal tenderness. There is no guarding.  Skin:    General: Skin is warm and dry.     Comments: Erythema on right chest around right IJ port improved, dry scaling skin on chest.   Erythema on left arm in area of fistula consistent with post op bruising but no warmth noted, no drainage noted from incisions    Neurological:     General: No focal deficit present.     Mental Status: He is alert and oriented to person, place, and time. Mental status is at baseline.     Assessment/Plan:  Patient is a 78 yo male with COPD, CAD, DM, SCC of nosed/p rhinectomy, ESRD on HD that presented with generalized weakness, fever and hypoxia from home.   Generalized Weaknessin setting of uremia due to missed HD:  Patient presented with generalized weakness andsubjectivefever for 2 days. Neutrophil predominant leukocytosis on admission.He received Rocephin 2g in ED.Patient had left arm basilic vein  fistula placement and right IJ dialysis catheter insertion on 01/21/2019. He had an incomplete hemodialysis session on 01/22/2019 and was too weak to go to dialysis on Friday. He also complains of left arm pain at site of fistula.Healing surgical incisionson left armsurrounded by appropriate post op bruising. No active drainage noted from incision sites.Fistula with good thrill.  Patient on vancomycin and Rocephin. WBC 9.6 this AM. Procal 3.73 > 4.48. Blood cultures with no growth to date. Unclear etiology.  Emesis overnight with nausea this AM at bedside. Patient received zofran 4mg  and phenergen. 12.5mg  overnight.  Patient is ESRD on HD with BUN/Cr 35/6.3. Patient had improvement of symptoms after HD yesterday.  Will continue to monitor.   - Vancomycin 1000mg  IV MWF and Rocephin 1g qd - Zofran increased to 8mg  q6h  - HD per Nephrology - Continue to monitor vitals   ESRD on HD:  Patient with Hx of ESRD on HD MWF. He missed a few dialysis sessions prior to presentation. He had dialysis session yesterday and tolerated it well. Per daughter, patient's outpatient dialysis weight is 95-96kg. Patient 97kg today. Plan for next dialysis session tomorrow.   - Continue calcium acetate - HD per nephrology   COPD:  Patient has Hx of COPD and uses 3L home O2 while sleeping and as  needed during daytime.  Patient also uses albuterol daily and has daily nebulizer treatments. Also uses Stiolto respimat 2 puffs qHS. He has morning productive cough at baseline unchanged.Patient breathing improved on examination this morning and lung exam with expiratory wheezing noted. Patient on aerosolized face tent on 8L 40% FiO2 with 95-96% saturation.   -Duoneb 27mL neb q6h, Anoro 1 puff qd  - Continue to monitor respiratory status  Hypertension:  Patient with Hx of HTN on amlodipine 10mg  qd, metoprolol 100mg  qd, HCTZ 25mg  qd, IMDUR 30mg  qd, and losartan 100mg  qd.  - Amlodipine 5mg  as pt has been hypertensive    Type II DM:  Patient with Hx of type II DM. Patient on Lantus 45-65U qHS + Humalog 10U tid. Here, patient getting Lantus 10U + Novolog 4u tid + SSI. CBG<200 - Lantus 10U + Novolog 4u tid + SSI - CBG monitoring   Tape allergy:  Diffuse erythema around catheter and upper right chest without warmth, pain or tenderness to area. Per daughter, this is chronic and patient requires benadryl for symptomatic control.  - Benadryl 25mg  q12h prn  Diabetic neuropathy: Patient has hx of diabetic neuropathy and is on gabapentin at home. On examination, gross sensation to touch is absent in bilateral lower extremities.  - Gabapentin 100mg  bid   DVT Prophylaxis: Heparin  Diet: Regular diet  Code: Full Dispo: Anticipated discharge in approximately 2-3 day(s).   Harvie Heck, MD  Internal Medicine, PGY-1 01/27/2019, 8:43 AM Pager: [phone]

## 2019-01-27 NOTE — Progress Notes (Signed)
  Date: 01/27/2019  Patient name: Thomas [name]  Medical record number: [id number]  Date of birth: 1941/05/10   I have seen and evaluated this patient and I have discussed the plan of care with the house staff. Please see their note for complete details. I concur with their findings with the following additions/corrections:   Mental status remains improved, but nauseated again this morning. Reports he gets nausea with antibiotics. Abdomen is quite distended still, firm, but still non-tender.   Oxygenation is improving, but still worse than home where he only uses prn. Procalcitonin still quite elevated, will continue antibiotics, though still unclear source. His weight also still appears to be above dry weight, will benefit from fluid removal at HD.   If vomiting worsens or develops abdominal pain, will likely need to evaluate further with CT.  Lenice Pressman, M.D., Ph.D. 01/27/2019, 2:26 PM

## 2019-01-28 DIAGNOSIS — I132 Hypertensive heart and chronic kidney disease with heart failure and with stage 5 chronic kidney disease, or end stage renal disease: Secondary | ICD-10-CM

## 2019-01-28 DIAGNOSIS — I5033 Acute on chronic diastolic (congestive) heart failure: Secondary | ICD-10-CM

## 2019-01-28 LAB — CBC WITH DIFFERENTIAL/PLATELET
Abs Immature Granulocytes: 0.45 10*3/uL — ABNORMAL HIGH (ref 0.00–0.07)
Basophils Absolute: 0.1 10*3/uL (ref 0.0–0.1)
Basophils Relative: 1 %
Eosinophils Absolute: 0.4 10*3/uL (ref 0.0–0.5)
Eosinophils Relative: 4 %
HCT: 30.5 % — ABNORMAL LOW (ref 39.0–52.0)
Hemoglobin: 9.3 g/dL — ABNORMAL LOW (ref 13.0–17.0)
Immature Granulocytes: 4 %
Lymphocytes Relative: 14 %
Lymphs Abs: 1.4 10*3/uL (ref 0.7–4.0)
MCH: 28.4 pg (ref 26.0–34.0)
MCHC: 30.5 g/dL (ref 30.0–36.0)
MCV: 93.3 fL (ref 80.0–100.0)
Monocytes Absolute: 0.7 10*3/uL (ref 0.1–1.0)
Monocytes Relative: 6 %
Neutro Abs: 7.1 10*3/uL (ref 1.7–7.7)
Neutrophils Relative %: 71 %
Platelets: 324 10*3/uL (ref 150–400)
RBC: 3.27 MIL/uL — ABNORMAL LOW (ref 4.22–5.81)
RDW: 15.9 % — ABNORMAL HIGH (ref 11.5–15.5)
WBC: 10.1 10*3/uL (ref 4.0–10.5)
nRBC: 0.5 % — ABNORMAL HIGH (ref 0.0–0.2)

## 2019-01-28 LAB — PROCALCITONIN: Procalcitonin: 4.39 ng/mL

## 2019-01-28 LAB — RENAL FUNCTION PANEL
Albumin: 2.6 g/dL — ABNORMAL LOW (ref 3.5–5.0)
Anion gap: 19 — ABNORMAL HIGH (ref 5–15)
BUN: 63 mg/dL — ABNORMAL HIGH (ref 8–23)
CO2: 24 mmol/L (ref 22–32)
Calcium: 8.6 mg/dL — ABNORMAL LOW (ref 8.9–10.3)
Chloride: 96 mmol/L — ABNORMAL LOW (ref 98–111)
Creatinine, Ser: 8.02 mg/dL — ABNORMAL HIGH (ref 0.61–1.24)
GFR calc Af Amer: 7 mL/min — ABNORMAL LOW (ref >60)
GFR calc non Af Amer: 6 mL/min — ABNORMAL LOW (ref >60)
Glucose, Bld: 143 mg/dL — ABNORMAL HIGH (ref 70–99)
Phosphorus: 8.9 mg/dL — ABNORMAL HIGH (ref 2.5–4.6)
Potassium: 3.9 mmol/L (ref 3.5–5.1)
Sodium: 139 mmol/L (ref 135–145)

## 2019-01-28 LAB — GLUCOSE, CAPILLARY
Glucose-Capillary: 128 mg/dL — ABNORMAL HIGH (ref 70–99)
Glucose-Capillary: 173 mg/dL — ABNORMAL HIGH (ref 70–99)
Glucose-Capillary: 141 mg/dL — ABNORMAL HIGH (ref 70–99)
Glucose-Capillary: 128 mg/dL — ABNORMAL HIGH (ref 70–99)

## 2019-01-28 MED ORDER — DM-GUAIFENESIN ER 30-600 MG PO TB12
1.0000 | ORAL_TABLET | Freq: Every day | ORAL | Status: AC
  Administered 2019-01-28 – 2019-01-29 (×2): 1 via ORAL
  Filled 2019-01-28 (×2): qty 1

## 2019-01-28 MED ORDER — VANCOMYCIN HCL IN DEXTROSE 1-5 GM/200ML-% IV SOLN
INTRAVENOUS | Status: AC
  Administered 2019-01-28: 1000 mg via INTRAVENOUS
  Filled 2019-01-28: qty 200

## 2019-01-28 MED ORDER — LORATADINE 10 MG PO TABS
10.0000 mg | ORAL_TABLET | ORAL | Status: DC
  Administered 2019-01-28: 10 mg via ORAL
  Filled 2019-01-28 (×2): qty 1

## 2019-01-28 MED ORDER — AMLODIPINE BESYLATE 10 MG PO TABS
10.0000 mg | ORAL_TABLET | Freq: Every day | ORAL | Status: DC
  Administered 2019-01-28 – 2019-01-30 (×3): 10 mg via ORAL
  Filled 2019-01-28 (×4): qty 1

## 2019-01-28 MED ORDER — HEPARIN SODIUM (PORCINE) 1000 UNIT/ML IJ SOLN
INTRAMUSCULAR | Status: AC
  Filled 2019-01-28: qty 1

## 2019-01-28 NOTE — Progress Notes (Signed)
[facility] ROUNDING NOTE   Subjective:   This is a 78 year old gentleman that is end-stage renal disease Monday Wednesday Friday dialysis goes to [facility].  He was admitted with fever rule out sepsis possible pneumonia blood cultures negative to date.  He had skipped 2 dialysis treatments last week.  He received his dialysis 01/26/2019.  No net ultrafiltration.  Blood pressure 164/69 pulse 82 temperature 99.2 94% face tent 8 L of oxygen  Sodium 139 potassium 3.9 chloride 96 CO2 24 BUN 63 creatinine 8 glucose 143 calcium 8.6 phosphorus 8.9 albumin 2.6 hemoglobin 9.3 WBC 10.1 platelets 324  Calcium acetate 1.334 g 3 times daily with meals, Lantus 10 units q. at bedtime, DuoNeb every 6 hours, metoprolol 25 mg twice daily, Anora Ellipta 1 puff daily.  Darbepoetin 60 mcg administered 01/26/2019  IV Rocephin 1 g every 24 hours IV vancomycin 1 g per pharmacy  Objective:  Vital signs in last 24 hours:  Temp:  [98.6 F (37 C)-99.2 F (37.3 C)] 99.2 F (37.3 C) (08/26 0531) Pulse Rate:  [76-85] 76 (08/26 0809) Resp:  [14-18] 18 (08/26 0809) BP: (146-164)/(57-69) 164/69 (08/26 0531) SpO2:  [88 %-97 %] 94 % (08/26 0809) FiO2 (%):  [40 %] 40 % (08/25 2055) Weight:  [95.5 kg] 95.5 kg (08/26 0531)  Weight change: -1.372 kg Filed Weights   01/26/19 1136 01/27/19 0452 01/28/19 0531  Weight: 96.9 kg 97 kg 95.5 kg    Intake/Output: I/O last 3 completed shifts: In: 250 [IV Piggyback:250] Out: 152 [Urine:150; Emesis/NG output:2]   Intake/Output this shift:  Total I/O In: 360 [P.O.:360] Out: -   Genheavy set WM, drowsy and falling asleep during converstation no resp distress No jvd or bruits Chest clear bilatto bases, no rales or wheezing Ant chest R pericath erythema, no pus or drainage RRR no MRG Abd soft ntnd no mass or ascites +bsobese Extno LE or UEedema Neuro is lethargic, arousable, myoclonic jerking may be a bit better today Ox 3 LUE AVF+bruit,  +bruising    Basic Metabolic Panel: Recent Labs  Lab 01/25/19 0528 01/25/19 1451 01/26/19 0522 01/27/19 0431 01/28/19 0402  NA 136 136 137 135 139  K 4.0 3.9 4.2 4.2 3.9  CL 93* 94* 98 94* 96*  CO2 17* 23 22 22 24   GLUCOSE 154* 146* 127* 179* 143*  BUN 78* 36* 50* 35* 63*  CREATININE 13.27* 8.21* 9.24* 6.33* 8.02*  CALCIUM 7.1* 7.4* 7.4* 8.3* 8.6*  MG 1.5*  --   --   --   --   PHOS >30.0* 6.6* 8.4* 7.6* 8.9*    Liver Function Tests: Recent Labs  Lab 01/24/19 1443 01/25/19 0528 01/25/19 1451 01/26/19 0522 01/27/19 0431 01/28/19 0402  AST 42*  --   --   --   --   --   ALT 8  --   --   --   --   --   ALKPHOS 89  --   --   --   --   --   BILITOT 0.5  --   --   --   --   --   PROT 6.5  --   --   --   --   --   ALBUMIN 2.8* 2.8* 2.7* 2.5* 2.5* 2.6*   No results for input(s): LIPASE, AMYLASE in the last 168 hours. No results for input(s): AMMONIA in the last 168 hours.  CBC: Recent Labs  Lab 01/24/19 1443 01/25/19 0528 01/26/19 0522  01/27/19 0431 01/28/19 0402  WBC 13.1* 11.8* 11.6* 9.6 10.1  NEUTROABS 10.5*  --  9.0* 7.8* 7.1  HGB 9.2* 10.0* 8.9* 9.0* 9.3*  HCT 29.2* 31.7* 28.7* 28.8* 30.5*  MCV 91.0 89.5 92.3 91.7 93.3  PLT 276 282 253 261 324    Cardiac Enzymes: Recent Labs  Lab 01/25/19 0528  CKTOTAL 173    BNP: Invalid input(s): POCBNP  CBG: Recent Labs  Lab 01/27/19 0721 01/27/19 1117 01/27/19 1637 01/27/19 2122 01/28/19 0655  GLUCAP 197* 169* 184* 143* 128*    Microbiology: Results for orders placed or performed during the hospital encounter of 01/24/19  Blood Culture (routine x 2)     Status: None (Preliminary result)   Collection Time: 01/24/19  1:56 PM   Specimen: BLOOD  Result Value Ref Range Status   Specimen Description BLOOD RIGHT ANTECUBITAL  Final   Special Requests   Final    BOTTLES DRAWN AEROBIC AND ANAEROBIC Blood Culture adequate volume   Culture   Final    NO GROWTH 3 DAYS Performed at [facility],  [address] [place], [place] [address]    Report Status PENDING  Incomplete  Blood Culture (routine x 2)     Status: None (Preliminary result)   Collection Time: 01/24/19  2:01 PM   Specimen: BLOOD  Result Value Ref Range Status   Specimen Description BLOOD BLOOD RIGHT HAND  Final   Special Requests   Final    BOTTLES DRAWN AEROBIC AND ANAEROBIC Blood Culture adequate volume   Culture   Final    NO GROWTH 3 DAYS Performed at [facility], [place] [address], [place], [place] [address]    Report Status PENDING  Incomplete  Novel Coronavirus, NAA (hospital order; send-out to ref lab)     Status: None   Collection Time: 01/24/19  2:43 PM  Result Value Ref Range Status   SARS-CoV-2, NAA NOT DETECTED NOT DETECTED Final    Comment: (NOTE) This test was developed and its performance characteristics determined by [employer]. This test has not been FDA cleared or approved. This test has been authorized by FDA under an Emergency Use Authorization (EUA). This test is only authorized for the duration of time the declaration that circumstances exist justifying the authorization of the emergency use of in vitro diagnostic tests for detection of SARS-CoV-2 virus and/or diagnosis of COVID-19 infection under section 564(b)(1) of the Act, 21 U.S.C. 812XNT-7(G)(0), unless the authorization is terminated or revoked sooner. When diagnostic testing is negative, the possibility of a false negative result should be considered in the context of a patient's recent exposures and the presence of clinical signs and symptoms consistent with COVID-19. An individual without symptoms of COVID-19 and who is not shedding SARS-CoV-2 virus would expect to have a negative (not detected) result in this assay. Performed  At: [facility] [address] [place], [place] [id number] Rush Farmer MD RF:[id number]    [place]  Final    Comment: Performed at [facility], [place] [address], [place], [place] [address]    Coagulation Studies: No results for input(s): LABPROT, INR in the last 72 hours.  Urinalysis: No results for input(s): COLORURINE, LABSPEC, PHURINE, GLUCOSEU, HGBUR, BILIRUBINUR, KETONESUR, PROTEINUR, UROBILINOGEN, NITRITE, LEUKOCYTESUR in the last 72 hours.  Invalid input(s): APPERANCEUR    Imaging: No results found.   Medications:   . cefTRIAXone (ROCEPHIN)  IV 1 g (01/27/19 1433)  . ferric gluconate (FERRLECIT/NULECIT) IV    .  ondansetron (ZOFRAN) IV 8 mg (01/27/19 1121)  . vancomycin     . calcium acetate  1,334 mg Oral TID WC  . Chlorhexidine Gluconate Cloth  6 each Topical Q0600  . Chlorhexidine Gluconate Cloth  6 each Topical Q0600  . darbepoetin (ARANESP) injection - DIALYSIS  60 mcg Intravenous Q Mon-HD  . dextromethorphan-guaiFENesin  1 tablet Oral Daily  . feeding supplement (PRO-STAT SUGAR FREE 64)  30 mL Oral TID BM  . heparin  5,000 Units Subcutaneous Q8H  . insulin aspart  0-15 Units Subcutaneous TID WC  . insulin aspart  0-5 Units Subcutaneous QHS  . insulin aspart  4 Units Subcutaneous TID WC  . insulin glargine  10 Units Subcutaneous QHS  . ipratropium-albuterol  3 mL Nebulization BID  . metoprolol tartrate  25 mg Oral BID  . multivitamin  1 tablet Oral QHS   acetaminophen **OR** acetaminophen, albuterol, calcium acetate, diphenhydrAMINE-zinc acetate, gabapentin, promethazine  Assessment/ Plan:   End-stage renal disease Monday Wednesday Friday dialysis had skipped 2 dialysis treatments at the end of last week he attends [facility] dialysis clinic.   Completed a successful dialysis treatment 01/26/2019 we will aim for dialysis  01/28/2019  Febrile illness T-max 100.6 last 24 hours continues on Rocephin 1 g daily and vancomycin per pharmacy.  No pathogens to date  COPD continues inhaler therapy  Hypertension we will add amlodipine 5 mg nightly  Bone mineral PTH 218 01/26/2019   increased calcium  acetate to 3 with meals    Diabetes mellitus as per primary team  Anemia we will dose with darbepoetin 60 mcg  added IV iron x5 doses   LOS: [place] @TODAY @9 :34 AM

## 2019-01-28 NOTE — Progress Notes (Addendum)
Attempted to wean patient to room air. O2 sat dropped down to 85% on RA. Put 2L back on, pt maintain at 94-95%.  On 1L pt  maintain 90 - 92%

## 2019-01-28 NOTE — Progress Notes (Addendum)
  Date: 01/28/2019  Patient name: Thomas [name]  Medical record number: [id number]  Date of birth: 06-17-1940   I have seen and evaluated this patient and I have discussed the plan of care with the house staff. Please see their note for complete details. I concur with their findings with the following additions/corrections:   Much improved today, nausea has resolved, abdomen is softer and less distended. Still no abdominal tenderness. Chest rash is improving. Respiratory status is still not back to baseline, requiring continuous O2 support, will continue antibiotics for possible CAP, hopefully have some fluid removed at HD today as there is likely contribution from acute on chronic CHF, previously documented as HFpEF.   Lenice Pressman, M.D., Ph.D. 01/28/2019, 3:27 PM

## 2019-01-28 NOTE — Progress Notes (Signed)
Subjective: Patient examined at bedside this morning. He appears to be doing well and in no acute distress. He is still on face tent and saturating 97%. Reports improvement in nausea and abdominal pain after having 3 BM's yesterday. Patient would like his daughter to be updated.   Objective:  Vital signs in last 24 hours: Vitals:   01/27/19 1501 01/27/19 2055 01/27/19 2109 01/28/19 0531  BP:   (!) 163/57 (!) 164/69  Pulse: 85  83 82  Resp: 14  16 17   Temp:   98.8 F (37.1 C) 99.2 F (37.3 C)  TempSrc:   Oral Oral  SpO2: 93% 97% 93% 97%  Weight:    95.5 kg  Height:       Physical Exam Constitutional:      General: He is not in acute distress.    Appearance: He is obese.  Cardiovascular:     Rate and Rhythm: Normal rate and regular rhythm.     Pulses: Normal pulses.     Heart sounds: No murmur. No friction rub. No gallop.   Pulmonary:     Effort: Pulmonary effort is normal.     Comments: Expiratory wheezing in all lung fields on auscultation, improving  Abdominal:     General: Bowel sounds are normal. There is distension.     Tenderness: There is no abdominal tenderness. There is no guarding.  Skin:    General: Skin is warm and dry.     Comments: Erythema on right chest around right IJ port improved  Erythema on left arm in area of fistula consistent with post op bruising but no warmth noted, no drainage noted from incisions     Neurological:     Mental Status: He is alert.     Assessment/Plan:  Patient is a 78 yo male with COPD, CAD, DM, SCC of nosed/p rhinectomy, ESRD on HD that presented with generalized weakness, fever and hypoxia from home. Improved with dialysis. Weakness improved however patient still has increased O2 requirements from baseline. Will continue with COPD treatment and wean to baseline status.   Generalized Weaknessin setting of uremia due to missed HD:  Patient presentedwith generalized weakness andsubjectivefever for 2 days.Neutrophil  predominant leukocytosis on admission.Left arm basilic vein fistula placement and right IJ dialysis catheter insertion on 01/21/2019. He had an incomplete hemodialysis session on 01/22/2019 and was too weak to go to dialysis 01/23/2019.Healing surgical incisionson left armsurrounded byappropriate post op bruising.No active drainage noted from incision sites.Fistula with good thrill.  Patient on vancomycinand Rocephin. WBC 10.1this AM. Procal 4.48>4.39. Blood cultures with no growth to date. Unclear etiology.  Nausea improved with increase in zofran overnight and patient tolerating food this AM. No abdominal pain this morning.  Patient is ESRD on HD with BUN/Cr63/8.0. Improvement in symptoms with HD and is scheduled for HD today.   - Vancomycin 1000mg  IV MWFand Rocephin 1g qd - Continue zofran 8mg  q6h  - HD per Nephrology - Continue to monitor vitals   ESRD on HD:  Patient with Hx of ESRD on HD MWF. He missed a few dialysis sessions prior to presentation. He had dialysis session yesterday and tolerated it well. Per daughter, patient's outpatient dialysis weight is 95-96kg.Marland Kitchen Plan for dialysis today.  - Continue calcium acetate - HD per nephrology   COPD:  Patient has Hx of COPD and uses 3L home O2 while sleeping and as needed during daytime.  Patient also uses albuterol daily and has daily nebulizer treatments. Also uses [employer]  2 puffs qHS. He has morning productive cough at baseline unchanged.Patient breathing improved on examination this morning and lung exam with expiratory wheezing noted. Patient on aerosolized face tent on 8L with 94-97% saturation.   -Duoneb 19mL neb q6h, Anoro 1 puff qd  - Continue to monitor respiratory status  Hypertension:  Patient with Hx of HTN on amlodipine 10mg  qd, metoprolol 100mg  qd, HCTZ 25mg  qd, IMDUR 30mg  qd, and losartan 100mg  qd.  - Amlodipine 10mg  qd   Type II DM:  Patient with Hx of type II DM. Patient on Lantus 45-65U qHS  + Humalog 10U tid. Here, patient getting Lantus 10U + Novolog 4u tid + SSI. CBG<200 - Lantus 10U + Novolog 4u tid + SSI - CBG monitoring   Tape allergy:  Diffuse erythema around catheter and upper right chest without warmth, pain or tenderness to area. Per daughter, this is chronic and patient requires benadryl for symptomatic control.  - Benadryl 25mg  q12h prn  Diabetic neuropathy: Patient has hx of diabetic neuropathy and is on gabapentin at home. On examination, gross sensation to touch is absent in bilateral lower extremities.  - Gabapentin 100mg  bid   DVT Prophylaxis: Heparin  Diet: Regular diet  Code: Full  Dispo: Anticipated discharge in approximately 1-2 day(s).   Harvie Heck, MD  Internal Medicine, PGY1  01/28/2019, 8:09 AM Pager: [phone]

## 2019-01-28 NOTE — Progress Notes (Signed)
Pt's heart rate went up but did not sustain. Will monitor pt closely

## 2019-01-28 NOTE — TOC Progression Note (Signed)
Transition of Care [facility]) - Progression Note    Patient Details  Name: Thomas [name] MRN: [id number] Date of Birth: 1940-06-28  [facility] CM/SW Contact  Zenon Mayo, RN Phone Number: 01/28/2019, 8:37 AM  Clinical Narrative:    [facility] team continues to follow for TOC needs.        Expected Discharge Plan and Services                                                 Social Determinants of Health (SDOH) Interventions    Readmission Risk Interventions No flowsheet data found.

## 2019-01-29 DIAGNOSIS — R197 Diarrhea, unspecified: Secondary | ICD-10-CM

## 2019-01-29 LAB — CBC WITH DIFFERENTIAL/PLATELET
Abs Immature Granulocytes: 0 10*3/uL (ref 0.00–0.07)
Basophils Absolute: 0.1 10*3/uL (ref 0.0–0.1)
Basophils Relative: 1 %
Eosinophils Absolute: 0.5 10*3/uL (ref 0.0–0.5)
Eosinophils Relative: 5 %
HCT: 30 % — ABNORMAL LOW (ref 39.0–52.0)
Hemoglobin: 9 g/dL — ABNORMAL LOW (ref 13.0–17.0)
Lymphocytes Relative: 9 %
Lymphs Abs: 0.8 10*3/uL (ref 0.7–4.0)
MCH: 28.2 pg (ref 26.0–34.0)
MCHC: 30 g/dL (ref 30.0–36.0)
MCV: 94 fL (ref 80.0–100.0)
Monocytes Absolute: 0.3 10*3/uL (ref 0.1–1.0)
Monocytes Relative: 3 %
Neutro Abs: 7.7 10*3/uL (ref 1.7–7.7)
Neutrophils Relative %: 82 %
Platelets: 258 10*3/uL (ref 150–400)
RBC: 3.19 MIL/uL — ABNORMAL LOW (ref 4.22–5.81)
RDW: 15.9 % — ABNORMAL HIGH (ref 11.5–15.5)
WBC: 9.4 10*3/uL (ref 4.0–10.5)
nRBC: 0 /100 WBC
nRBC: 0.7 % — ABNORMAL HIGH (ref 0.0–0.2)

## 2019-01-29 LAB — RENAL FUNCTION PANEL
Albumin: 2.5 g/dL — ABNORMAL LOW (ref 3.5–5.0)
Albumin: 2.5 g/dL — ABNORMAL LOW (ref 3.5–5.0)
Anion gap: 16 — ABNORMAL HIGH (ref 5–15)
Anion gap: 16 — ABNORMAL HIGH (ref 5–15)
BUN: 39 mg/dL — ABNORMAL HIGH (ref 8–23)
BUN: 51 mg/dL — ABNORMAL HIGH (ref 8–23)
CO2: 25 mmol/L (ref 22–32)
CO2: 25 mmol/L (ref 22–32)
Calcium: 8.6 mg/dL — ABNORMAL LOW (ref 8.9–10.3)
Calcium: 8.8 mg/dL — ABNORMAL LOW (ref 8.9–10.3)
Chloride: 97 mmol/L — ABNORMAL LOW (ref 98–111)
Chloride: 98 mmol/L (ref 98–111)
Creatinine, Ser: 5.35 mg/dL — ABNORMAL HIGH (ref 0.61–1.24)
Creatinine, Ser: 6.45 mg/dL — ABNORMAL HIGH (ref 0.61–1.24)
GFR calc Af Amer: 11 mL/min — ABNORMAL LOW (ref >60)
GFR calc Af Amer: 9 mL/min — ABNORMAL LOW (ref >60)
GFR calc non Af Amer: 9 mL/min — ABNORMAL LOW (ref >60)
GFR calc non Af Amer: 8 mL/min — ABNORMAL LOW (ref >60)
Glucose, Bld: 136 mg/dL — ABNORMAL HIGH (ref 70–99)
Glucose, Bld: 133 mg/dL — ABNORMAL HIGH (ref 70–99)
Phosphorus: 5.9 mg/dL — ABNORMAL HIGH (ref 2.5–4.6)
Phosphorus: 6.9 mg/dL — ABNORMAL HIGH (ref 2.5–4.6)
Potassium: 4.1 mmol/L (ref 3.5–5.1)
Potassium: 4.1 mmol/L (ref 3.5–5.1)
Sodium: 138 mmol/L (ref 135–145)
Sodium: 139 mmol/L (ref 135–145)

## 2019-01-29 LAB — CBC
HCT: 30.2 % — ABNORMAL LOW (ref 39.0–52.0)
Hemoglobin: 9.3 g/dL — ABNORMAL LOW (ref 13.0–17.0)
MCH: 28.4 pg (ref 26.0–34.0)
MCHC: 30.8 g/dL (ref 30.0–36.0)
MCV: 92.1 fL (ref 80.0–100.0)
Platelets: 299 10*3/uL (ref 150–400)
RBC: 3.28 MIL/uL — ABNORMAL LOW (ref 4.22–5.81)
RDW: 15.8 % — ABNORMAL HIGH (ref 11.5–15.5)
WBC: 9.9 10*3/uL (ref 4.0–10.5)
nRBC: 0.2 % (ref 0.0–0.2)

## 2019-01-29 LAB — GLUCOSE, CAPILLARY
Glucose-Capillary: 110 mg/dL — ABNORMAL HIGH (ref 70–99)
Glucose-Capillary: 127 mg/dL — ABNORMAL HIGH (ref 70–99)
Glucose-Capillary: 129 mg/dL — ABNORMAL HIGH (ref 70–99)
Glucose-Capillary: 139 mg/dL — ABNORMAL HIGH (ref 70–99)
Glucose-Capillary: 149 mg/dL — ABNORMAL HIGH (ref 70–99)

## 2019-01-29 LAB — CULTURE, BLOOD (ROUTINE X 2)
Culture: NO GROWTH
Culture: NO GROWTH
Special Requests: ADEQUATE
Special Requests: ADEQUATE

## 2019-01-29 LAB — C DIFFICILE QUICK SCREEN W PCR REFLEX
C Diff antigen: NEGATIVE
C Diff interpretation: NOT DETECTED
C Diff toxin: NEGATIVE

## 2019-01-29 LAB — PATHOLOGIST SMEAR REVIEW

## 2019-01-29 LAB — PROCALCITONIN: Procalcitonin: 3.68 ng/mL

## 2019-01-29 MED ORDER — ONDANSETRON HCL 4 MG/2ML IJ SOLN
4.0000 mg | Freq: Four times a day (QID) | INTRAMUSCULAR | Status: DC | PRN
  Administered 2019-01-30: 11:00:00 4 mg via INTRAVENOUS

## 2019-01-29 MED ORDER — HEPARIN SODIUM (PORCINE) 1000 UNIT/ML DIALYSIS: 1000.0000 [IU] | INTRAMUSCULAR | Status: DC | PRN

## 2019-01-29 MED ORDER — PENTAFLUOROPROP-TETRAFLUOROETH EX AERO: 1.0000 "application " | INHALATION_SPRAY | CUTANEOUS | Status: DC | PRN

## 2019-01-29 MED ORDER — SODIUM CHLORIDE 0.9 % IV SOLN: 100.0000 mL | INTRAVENOUS | Status: DC | PRN

## 2019-01-29 MED ORDER — LIDOCAINE HCL (PF) 1 % IJ SOLN: 5.0000 mL | INTRAMUSCULAR | Status: DC | PRN

## 2019-01-29 MED ORDER — LIDOCAINE-PRILOCAINE 2.5-2.5 % EX CREA: 1.0000 "application " | TOPICAL_CREAM | CUTANEOUS | Status: DC | PRN

## 2019-01-29 MED ORDER — ALTEPLASE 2 MG IJ SOLR: 2.0000 mg | Freq: Once | INTRAMUSCULAR | Status: DC | PRN

## 2019-01-29 MED ORDER — CHLORHEXIDINE GLUCONATE CLOTH 2 % EX PADS
6.0000 | MEDICATED_PAD | Freq: Every day | CUTANEOUS | Status: DC
  Administered 2019-01-29 – 2019-01-30 (×2): 6 via TOPICAL

## 2019-01-29 MED ORDER — HEPARIN SODIUM (PORCINE) 1000 UNIT/ML DIALYSIS
1000.0000 [IU] | INTRAMUSCULAR | Status: DC | PRN
  Administered 2019-01-30: 11:00:00 4000 [IU] via INTRAVENOUS_CENTRAL
  Filled 2019-01-29: qty 1

## 2019-01-29 NOTE — Progress Notes (Signed)
  Date: 01/29/2019  Patient name: Thomas [name]  Medical record number: [id number]  Date of birth: [birth year]   I have seen and evaluated this patient and I have discussed the plan of care with the house staff. Please see their note for complete details. I concur with their findings with the following additions/corrections:   Having significant nausea again today.  Nursing reports he has had multiple bowel movements overnight and today.  Respiratory status seems to have improved somewhat, although he continues to require continuous oxygen support with a face tent.  Procalcitonin is mildly improved at 3.7, but no fevers in the past 72 hours.  WBC and Hgb are stable.  Given his new diarrhea, we will work him up for C. difficile.  Otherwise, will work on mobility and suspect he may be ready for discharge in the next day or so.  Lenice Pressman, M.D., Ph.D. 01/29/2019, 1:47 PM

## 2019-01-29 NOTE — Progress Notes (Signed)
[facility] ROUNDING NOTE   Subjective:   This is a 78 year old gentleman that is end-stage renal disease Monday Wednesday Friday dialysis goes to [facility].  He was admitted with fever rule out sepsis possible pneumonia blood cultures negative to date.  He had skipped 2 dialysis treatments last week.   He received dialysis 01/28/2019 with 2 L ultrafiltrate it.  He appeared to tolerate this well  Blood pressure 171/65 pulse 86 temperature 98.6  Sodium 138 potassium 4.1 chloride 97 CO2 25 BUN 35 creatinine 5.35 glucose 136 calcium 8.6 phosphorus 5.9 albumin 2.5 WBC 9.4 hemoglobin 9.0 platelets 258  Amlodipine 10 mg daily, calcium acetate 1.334 g 3 times daily with meals, Lantus 10 units q. at bedtime, DuoNeb every 6 hours, metoprolol 25 mg twice daily, Anora Ellipta 1 puff daily.  Darbepoetin 60 mcg administered 01/26/2019  IV Rocephin 1 g every 24 hours IV vancomycin 1 g per pharmacy  Objective:  Vital signs in last 24 hours:  Temp:  [97.7 F (36.5 C)-98.6 F (37 C)] 98.6 F (37 C) (08/27 0353) Pulse Rate:  [74-85] 85 (08/27 0902) Resp:  [17-18] 18 (08/27 0353) BP: (115-171)/(52-76) 171/65 (08/27 0902) SpO2:  [85 %-98 %] 96 % (08/27 0839) FiO2 (%):  [28 %] 28 % (08/27 0839) Weight:  [93.8 kg-96.9 kg] 93.8 kg (08/27 0353)  Weight change: 1.372 kg Filed Weights   01/28/19 1220 01/28/19 1741 01/29/19 0353  Weight: 96.9 kg 94.9 kg 93.8 kg    Intake/Output: I/O last 3 completed shifts: In: 360 [P.O.:360] Out: 2005 [Other:2005]   Intake/Output this shift:  No intake/output data recorded.  Genheavy set WM, drowsy and falling asleep during converstation no resp distress No jvd or bruits Chest clear bilatto bases, no rales or wheezing Ant chest R pericath erythema, no pus or drainage RRR no MRG Abd soft ntnd no mass or ascites +bsobese Extno LE or UEedema Neuro is lethargic, arousable, myoclonic jerking may be a bit better today Ox 3 LUE AVF+bruit,  +bruising    Basic Metabolic Panel: Recent Labs  Lab 01/25/19 0528 01/25/19 1451 01/26/19 0522 01/27/19 0431 01/28/19 0402 01/29/19 0443  NA 136 136 137 135 139 138  K 4.0 3.9 4.2 4.2 3.9 4.1  CL 93* 94* 98 94* 96* 97*  CO2 17* 23 22 22 24 25   GLUCOSE 154* 146* 127* 179* 143* 136*  BUN 78* 36* 50* 35* 63* 39*  CREATININE 13.27* 8.21* 9.24* 6.33* 8.02* 5.35*  CALCIUM 7.1* 7.4* 7.4* 8.3* 8.6* 8.6*  MG 1.5*  --   --   --   --   --   PHOS >30.0* 6.6* 8.4* 7.6* 8.9* 5.9*    Liver Function Tests: Recent Labs  Lab 01/24/19 1443  01/25/19 1451 01/26/19 0522 01/27/19 0431 01/28/19 0402 01/29/19 0443  AST 42*  --   --   --   --   --   --   ALT 8  --   --   --   --   --   --   ALKPHOS 89  --   --   --   --   --   --   BILITOT 0.5  --   --   --   --   --   --   PROT 6.5  --   --   --   --   --   --   ALBUMIN 2.8*   < > 2.7* 2.5* 2.5* 2.6* 2.5*   < > =  values in this interval not displayed.   No results for input(s): LIPASE, AMYLASE in the last 168 hours. No results for input(s): AMMONIA in the last 168 hours.  CBC: Recent Labs  Lab 01/24/19 1443 01/25/19 0528 01/26/19 0522 01/27/19 0431 01/28/19 0402 01/29/19 0443  WBC 13.1* 11.8* 11.6* 9.6 10.1 9.4  NEUTROABS 10.5*  --  9.0* 7.8* 7.1 7.7  HGB 9.2* 10.0* 8.9* 9.0* 9.3* 9.0*  HCT 29.2* 31.7* 28.7* 28.8* 30.5* 30.0*  MCV 91.0 89.5 92.3 91.7 93.3 94.0  PLT 276 282 253 261 324 258    Cardiac Enzymes: Recent Labs  Lab 01/25/19 0528  CKTOTAL 173    BNP: Invalid input(s): POCBNP  CBG: Recent Labs  Lab 01/28/19 0655 01/28/19 1150 01/28/19 1744 01/28/19 2057 01/29/19 0551  GLUCAP 128* 173* 141* 128* 129*    Microbiology: Results for orders placed or performed during the hospital encounter of 01/24/19  Blood Culture (routine x 2)     Status: None (Preliminary result)   Collection Time: 01/24/19  1:56 PM   Specimen: BLOOD  Result Value Ref Range Status   Specimen Description BLOOD RIGHT ANTECUBITAL   Final   Special Requests   Final    BOTTLES DRAWN AEROBIC AND ANAEROBIC Blood Culture adequate volume   Culture   Final    NO GROWTH 4 DAYS Performed at [facility], [address] [place], [place] [address]    Report Status PENDING  Incomplete  Blood Culture (routine x 2)     Status: None (Preliminary result)   Collection Time: 01/24/19  2:01 PM   Specimen: BLOOD  Result Value Ref Range Status   Specimen Description BLOOD BLOOD RIGHT HAND  Final   Special Requests   Final    BOTTLES DRAWN AEROBIC AND ANAEROBIC Blood Culture adequate volume   Culture   Final    NO GROWTH 4 DAYS Performed at [facility], [place] [address] [place], [place] [address]    Report Status PENDING  Incomplete  Novel Coronavirus, NAA (hospital order; send-out to ref lab)     Status: None   Collection Time: 01/24/19  2:43 PM  Result Value Ref Range Status   SARS-CoV-2, NAA NOT DETECTED NOT DETECTED Final    Comment: (NOTE) This test was developed and its performance characteristics determined by [employer]. This test has not been FDA cleared or approved. This test has been authorized by FDA under an Emergency Use Authorization (EUA). This test is only authorized for the duration of time the declaration that circumstances exist justifying the authorization of the emergency use of in vitro diagnostic tests for detection of SARS-CoV-2 virus and/or diagnosis of COVID-19 infection under section 564(b)(1) of the Act, 21 U.S.C. 098JXB-1(Y)(7), unless the authorization is terminated or revoked sooner. When diagnostic testing is negative, the possibility of a false negative result should be considered in the context of a patient's recent exposures and the presence of clinical signs and symptoms consistent with COVID-19. An individual without symptoms of COVID-19 and who is not shedding SARS-CoV-2 virus would expect to have a negative (not detected) result in this assay. Performed  At: [facility] [address] [place], [place] [id number] Rush Farmer MD QM:[id number]    [place]  Final    Comment: Performed at [facility], [place] [address] [place], [place] [address]    Coagulation Studies: No results for input(s): LABPROT, INR in the last 72 hours.  Urinalysis: No results  for input(s): COLORURINE, LABSPEC, [place], GLUCOSEU, HGBUR, BILIRUBINUR, KETONESUR, PROTEINUR, UROBILINOGEN, NITRITE, LEUKOCYTESUR in the last 72 hours.  Invalid input(s): APPERANCEUR    Imaging: No results found.   Medications:   . cefTRIAXone (ROCEPHIN)  IV 1 g (01/28/19 1837)  . ferric gluconate (FERRLECIT/NULECIT) IV Stopped (01/28/19 1838)  . vancomycin     . amLODipine  10 mg Oral Daily  . calcium acetate  1,334 mg Oral TID WC  . Chlorhexidine Gluconate Cloth  6 each Topical Q0600  . Chlorhexidine Gluconate Cloth  6 each Topical Q0600  . darbepoetin (ARANESP) injection - DIALYSIS  60 mcg Intravenous Q Mon-HD  . dextromethorphan-guaiFENesin  1 tablet Oral Daily  . feeding supplement (PRO-STAT SUGAR FREE 64)  30 mL Oral TID BM  . heparin  5,000 Units Subcutaneous Q8H  . insulin aspart  0-15 Units Subcutaneous TID WC  . insulin aspart  0-5 Units Subcutaneous QHS  . insulin aspart  4 Units Subcutaneous TID WC  . insulin glargine  10 Units Subcutaneous QHS  . ipratropium-albuterol  3 mL Nebulization BID  . loratadine  10 mg Oral Q48H  . metoprolol tartrate  25 mg Oral BID  . multivitamin  1 tablet Oral QHS   acetaminophen **OR** acetaminophen, albuterol, calcium acetate, diphenhydrAMINE-zinc acetate, gabapentin, ondansetron (ZOFRAN) IV, promethazine  Assessment/ Plan:   End-stage renal disease Monday Wednesday Friday dialysis had skipped 2 dialysis treatments at the end of last week he attends [facility] dialysis clinic.   Completed successful dialysis 01/28/2019 with ultrafiltration of 2 L we will plan next dialysis treatment for  01/30/2019  Febrile illness T-max 100.6 last 24 hours continues on Rocephin 1 g daily and vancomycin per pharmacy.  No pathogens to date  COPD continues inhaler therapy  Hypertension we will add amlodipine 5 mg nightly  Bone mineral PTH 218 01/26/2019   increased calcium acetate to 3 with meals    Diabetes mellitus as per primary team  Anemia we will dose with darbepoetin 60 mcg  added IV iron x5 doses   LOS: 5 Sherril Croon @TODAY @9 :03 AM

## 2019-01-29 NOTE — Progress Notes (Signed)
Pharmacy Antibiotic Note  Thomas [name] is a 78 y.o. male admitted on 01/24/2019 with cellulitis.  Pharmacy has been consulted for Vancomycin dosing.   The patient is ESRD normally MWF however received an off-schedule session on Sun, 8/23 with a reduced BFR of 200 ml/min for 3.5 hours.  Tolerated full HD session 8/26, back on MWF schedule  Cultures negative to date  Plan: - Continue  Vancomycin 1g/HD-MWF - Rocephin per MD - Will continue to follow HD schedule/duration, culture results, LOT, and antibiotic de-escalation plans    Height: 5\' 11"  (180.3 cm) Weight: 206 lb 12.7 oz (93.8 kg) IBW/kg (Calculated) : 75.3  Temp (24hrs), Avg:98.1 F (36.7 C), Min:97.7 F (36.5 C), Max:98.6 F (37 C)  Recent Labs  Lab 01/24/19 1443 01/25/19 0528 01/25/19 1451 01/26/19 0522 01/27/19 0431 01/28/19 0402 01/29/19 0443  WBC 13.1* 11.8*  --  11.6* 9.6 10.1 9.4  CREATININE 12.21* 13.27* 8.21* 9.24* 6.33* 8.02* 5.35*  LATICACIDVEN 0.9  --   --   --   --   --   --     Estimated Creatinine Clearance: 13.3 mL/min (A) (by C-G formula based on SCr of 5.35 mg/dL (H)).    Allergies  Allergen Reactions  . Ace Inhibitors Anaphylaxis    Swelling in throat and neck  . Statins Anaphylaxis and Swelling    Caused feet, tongue and throat to swell.  . Codeine Other (See Comments)    Muscle Spasms    Antimicrobials this admission: Vanc 8/22>> CTX  8/22>>  Dose adjustments this admission: N/A  Microbiology results: 8/22 Bcx: ngtd 8/22 COVID >> neg  Thank you  Anette Guarneri, PharmD  01/29/2019 11:32 AM   **Pharmacist phone directory can now be found on [email or web address] ([email or web address]).  Listed under [place].

## 2019-01-29 NOTE — Progress Notes (Signed)
Subjective: Patient examined at bedside this morning. He is complaining of continued nausea but denies any abdominal pain. He is continued on face vent 5L and saturating 96%. He does not appear to be in respiratory distress but is dry heaving because of persistent nausea.   Objective:  Vital signs in last 24 hours: Vitals:   01/29/19 0859 01/29/19 0900 01/29/19 0902 01/29/19 1151  BP: (!) 173/70 (!) 172/64 (!) 171/65 (!) 151/68  Pulse: 84 84 85 81  Resp:    18  Temp:    98.3 F (36.8 C)  TempSrc:    Oral  SpO2:    97%  Weight:      Height:       Physical Exam Constitutional:      Appearance: He is obese. He is ill-appearing.  HENT:     Mouth/Throat:     Mouth: Mucous membranes are moist.     Pharynx: Oropharynx is clear.  Cardiovascular:     Rate and Rhythm: Normal rate and regular rhythm.     Pulses: Normal pulses.     Heart sounds: Normal heart sounds. No murmur. No friction rub. No gallop.   Pulmonary:     Effort: Pulmonary effort is normal. No respiratory distress.     Breath sounds: Wheezing present.     Comments: Diffuse expiratory wheezing present throughout all lung fields  Abdominal:     General: Bowel sounds are normal. There is distension.     Tenderness: There is no abdominal tenderness. There is no guarding.     Hernia: No hernia is present.  Skin:    General: Skin is warm and dry.     Comments: Erythema on right chest around right IJ port improved Erythema on left arm in area of fistula consistent with post op bruising but no warmth noted, no drainage noted from incisions      Neurological:     General: No focal deficit present.     Mental Status: He is alert and oriented to person, place, and time. Mental status is at baseline.     Assessment/Plan:  Patient is a 78 yo male with COPD, CAD, DM, SCC of nosed/p rhinectomy, ESRD on HD that presented with generalized weakness, fever and hypoxia from home. Improved with dialysis. Weakness improved and  patient attempted weaned to 2L O2 with 94-95% saturation.   Generalized Weaknessin setting ofuremia due to missed HD:  Patient presentedwith generalized weakness andsubjectivefever for 2 days.Neutrophil predominant leukocytosis on admission.Left arm basilic vein fistula placement and right IJ dialysis catheter insertion on 01/21/2019 with subsequent missing of HD sessions. Patient on vancomycinand Rocephin day 5/7. WBC9.4this AM.Procal 4.39>3.60Blood cultures with no growth to date. Unclear etiology.  Persistent nausea despite zofran and phenergen. Patient with multiple bowel movements overnight described as looking like "apple sauce". Has some abdominal pain.  Patient is ESRD on HD with BUN/Cr39/5.35. Patient had HD yesterday with ~2L off.   - Vancomycin 1000mg  IV MWFand Rocephin 1g qd - Continue zofran 8mg  q6h - C.diff testing with enteric precautions  - HD per Nephrology - Continue to monitor vitals   ESRD on HD:  Patient with Hx of ESRD on HD MWF.He missed a few dialysis sessions prior to presentation. He had dialysis session yesterday and tolerated it well. Per daughter, patient's outpatient dialysis weight is 95-96kg.Patient had HD yesterday and weight this AM 93.8kg.   - Continue calcium acetate - HD per nephrology   COPD:  Patient has Hx of COPDand uses  3L home O2 while sleeping and as needed during daytime.Patient also uses albuterol daily and has daily nebulizer treatments. Also uses Stiolto respimat 2 puffs qHS. He has morning productive cough at baseline unchanged.Patient breathing improved on examination this morning and lung exam with expiratory wheezing noted. Patient attempted to wean to home O2 tolerating 2L 02 on face vent.   -Duoneb 61mL neb q6h, Anoro 1 puff qd  - Continue to monitor respiratory status  Hypertension:  Patient with Hx of HTN on amlodipine 10mg  qd, metoprolol 100mg  qd, HCTZ 25mg  qd, IMDUR 30mg  qd, and losartan 100mg  qd. -  Amlodipine 10mg  qd   Type II DM:  Patient with Hx of type II DM. Patient on Lantus 45-65U qHS + Humalog 10U tid. Here, patient getting Lantus 10U + Novolog 4u tid + SSI.Patient has not been receiving meal time novolog but has been getting 2U SSI. CBG<200 - Lantus 10U + Novolog 4u tid + SSI - CBG monitoring   Tape allergy:  Diffuse erythema around catheter and upper right chest without warmth, pain or tenderness to area. Per daughter, this is chronic and patient requires benadryl for symptomatic control.  - Benadryl 25mg  q12h prn  Diabetic neuropathy: Patient has hx of diabetic neuropathy and is on gabapentin at home. On examination, gross sensation to touch is absent in bilateral lower extremities.  - Gabapentin 100mg  bid   DVT Prophylaxis: Heparin  Diet: Regular diet  Code: Full  Dispo: Anticipated discharge in approximately 2-3 day(s).   Harvie Heck, MD  Internal Medicine, PGY1  01/29/2019, 1:09 PM Pager: [phone]

## 2019-01-30 DIAGNOSIS — Z885 Allergy status to narcotic agent status: Secondary | ICD-10-CM

## 2019-01-30 DIAGNOSIS — Z888 Allergy status to other drugs, medicaments and biological substances status: Secondary | ICD-10-CM

## 2019-01-30 LAB — GLUCOSE, CAPILLARY
Glucose-Capillary: 125 mg/dL — ABNORMAL HIGH (ref 70–99)
Glucose-Capillary: 151 mg/dL — ABNORMAL HIGH (ref 70–99)

## 2019-01-30 LAB — RENAL FUNCTION PANEL
Albumin: 2.6 g/dL — ABNORMAL LOW (ref 3.5–5.0)
Anion gap: 19 — ABNORMAL HIGH (ref 5–15)
BUN: 64 mg/dL — ABNORMAL HIGH (ref 8–23)
CO2: 25 mmol/L (ref 22–32)
Calcium: 8.9 mg/dL (ref 8.9–10.3)
Chloride: 97 mmol/L — ABNORMAL LOW (ref 98–111)
Creatinine, Ser: 7.74 mg/dL — ABNORMAL HIGH (ref 0.61–1.24)
GFR calc Af Amer: 7 mL/min — ABNORMAL LOW (ref >60)
GFR calc non Af Amer: 6 mL/min — ABNORMAL LOW (ref >60)
Glucose, Bld: 144 mg/dL — ABNORMAL HIGH (ref 70–99)
Phosphorus: 8.5 mg/dL — ABNORMAL HIGH (ref 2.5–4.6)
Potassium: 4.2 mmol/L (ref 3.5–5.1)
Sodium: 141 mmol/L (ref 135–145)

## 2019-01-30 LAB — CBC
HCT: 30.9 % — ABNORMAL LOW (ref 39.0–52.0)
Hemoglobin: 9.5 g/dL — ABNORMAL LOW (ref 13.0–17.0)
MCH: 28.3 pg (ref 26.0–34.0)
MCHC: 30.7 g/dL (ref 30.0–36.0)
MCV: 92 fL (ref 80.0–100.0)
Platelets: 270 10*3/uL (ref 150–400)
RBC: 3.36 MIL/uL — ABNORMAL LOW (ref 4.22–5.81)
RDW: 15.7 % — ABNORMAL HIGH (ref 11.5–15.5)
WBC: 8.6 10*3/uL (ref 4.0–10.5)
nRBC: 0.4 % — ABNORMAL HIGH (ref 0.0–0.2)

## 2019-01-30 LAB — PROCALCITONIN: Procalcitonin: 3.15 ng/mL

## 2019-01-30 MED ORDER — ONDANSETRON HCL 4 MG/2ML IJ SOLN
INTRAMUSCULAR | Status: AC
  Administered 2019-01-30: 4 mg via INTRAVENOUS
  Filled 2019-01-30: qty 2

## 2019-01-30 MED ORDER — DIPHENHYDRAMINE-ZINC ACETATE 2-0.1 % EX CREA: TOPICAL_CREAM | Freq: Two times a day (BID) | CUTANEOUS | 0 refills | Status: DC | PRN

## 2019-01-30 MED ORDER — HEPARIN SODIUM (PORCINE) 1000 UNIT/ML IJ SOLN
INTRAMUSCULAR | Status: AC
  Administered 2019-01-30: 4000 [IU] via INTRAVENOUS_CENTRAL
  Filled 2019-01-30: qty 4

## 2019-01-30 NOTE — Progress Notes (Signed)
PT Cancellation Note  Patient Details Name: DURANTE VIOLETT MRN: [id number] DOB: May 13, 1941   Cancelled Treatment:    Reason Eval/Treat Not Completed: Patient at procedure or test/unavailable(HD)   Sandy Salaam Keesha Pellum 01/30/2019, 7:46 AM  Elwyn Reach, PT Acute Rehabilitation Services Pager: [phone] Office: [phone]

## 2019-01-30 NOTE — Progress Notes (Signed)
OT Cancellation Note  Patient Details Name: Thomas [name] MRN: [id number] DOB: [birth year]   Cancelled Treatment:    Reason Eval/Treat Not Completed: Patient at procedure or test/ unavailable (HD)  [place], OTR/L Acute Rehab Services Pager [phone] Office [phone]     Almon Register 01/30/2019, 8:06 AM

## 2019-01-30 NOTE — Discharge Summary (Addendum)
Name: Thomas [name] MRN: [id number] DOB: 1941-01-03 78 y.o. PCP: Medicine, [facility]  Date of Admission: 01/24/2019  1:33 PM Date of Discharge: 8/28/20208/28/2018 Attending Physician: Lenice Pressman, MD, PhD   Discharge Diagnosis: 1.  Generalized weakness in the setting of uremia and ESRD  Discharge Medications: Allergies as of 01/30/2019       Reactions   Ace Inhibitors Anaphylaxis   Swelling in throat and neck   Statins Anaphylaxis, Swelling   Caused feet, tongue and throat to swell.   Codeine Other (See Comments)   Muscle Spasms        Medication List     STOP taking these medications    diphenhydrAMINE 25 MG tablet Commonly known as: BENADRYL       TAKE these medications    acetaminophen 650 MG CR tablet Commonly known as: TYLENOL Take 1,300 mg by mouth every 8 (eight) hours as needed for pain.   albuterol 2 MG/5ML syrup Commonly known as: VENTOLIN Take 2 mg by mouth 2 (two) times a day.   albuterol 108 (90 Base) MCG/ACT inhaler Commonly known as: VENTOLIN HFA Inhale 1-2 puffs into the lungs every 4 (four) hours as needed for wheezing or shortness of breath.   ALPRAZolam 1 MG tablet Commonly known as: XANAX Take 1 mg by mouth at bedtime as needed for sleep.   amLODipine 10 MG tablet Commonly known as: NORVASC Take 10 mg by mouth daily at 12 noon.   b complex-vitamin c-folic acid 0.8 MG Tabs tablet Take 1 tablet by mouth daily.   butalbital-acetaminophen-caffeine 50-325-40 MG tablet Commonly known as: FIORICET Take 1 tablet by mouth every 6 (six) hours as needed for headache.   CALCIUM + D3 PO Take 1 tablet by mouth daily.   calcium acetate 667 MG capsule Commonly known as: PHOSLO Take 667-1,334 mg by mouth See admin instructions. Take 1334 mg with each meal and 667 mg with each snack   dicyclomine 20 MG tablet Commonly known as: BENTYL Take 20 mg by mouth 4 (four) times daily as needed for spasms.   diphenhydrAMINE-zinc acetate  cream Commonly known as: BENADRYL Apply topically 2 (two) times daily as needed for itching.   Fish Oil 1000 MG Caps Take 2,000 mg by mouth 2 (two) times a day.   gabapentin 300 MG capsule Commonly known as: NEURONTIN Take 300 mg by mouth See admin instructions. Take 300 mg daily, may take an additional 300 mg dose up to 2 times daily as needed for pain   hydrochlorothiazide 25 MG tablet Commonly known as: HYDRODIURIL Take 25 mg by mouth daily.   insulin lispro 100 UNIT/ML injection Commonly known as: HUMALOG Inject 10 Units into the skin 3 (three) times daily with meals.   isosorbide mononitrate 30 MG 24 hr tablet Commonly known as: IMDUR Take 30 mg by mouth daily.   Lantus SoloStar 100 UNIT/ML Solostar Pen Generic drug: Insulin Glargine Inject 45-65 Units into the skin at bedtime. According to sugar level.   lidocaine-prilocaine cream Commonly known as: EMLA Apply 1 application topically as needed (port access).   loperamide 1 MG/5ML solution Commonly known as: IMODIUM Take 1-2 mg by mouth as needed for diarrhea or loose stools.   losartan 100 MG tablet Commonly known as: COZAAR Take 100 mg by mouth daily.   magnesium oxide 400 MG tablet Commonly known as: MAG-OX Take 400 mg by mouth at bedtime.   metoprolol tartrate 100 MG tablet Commonly known as: LOPRESSOR Take 100 mg by  mouth 2 (two) times daily.   Nitrostat 0.4 MG SL tablet Generic drug: nitroGLYCERIN Place 0.4 mg under the tongue every 5 (five) minutes as needed for chest pain.   omeprazole 20 MG capsule Commonly known as: PRILOSEC Take 20 mg by mouth 2 (two) times daily before a meal.   ondansetron 4 MG tablet Commonly known as: ZOFRAN Take 4 mg by mouth every 8 (eight) hours as needed for nausea or vomiting.   oxyCODONE-acetaminophen 5-325 MG tablet Commonly known as: PERCOCET/ROXICET Take 1 tablet by mouth every 6 (six) hours as needed.   primidone 50 MG tablet Commonly known as:  MYSOLINE Take 50 mg by mouth at bedtime.   REDNESS RELIEF OP Place 1 drop into both eyes daily as needed (redness/ dryness).   Stiolto Respimat 2.5-2.5 MCG/ACT Aers Generic drug: Tiotropium Bromide-Olodaterol Inhale 2 puffs into the lungs at bedtime.   Vitamin D3 50 MCG (2000 UT) capsule Take 4,000 Units by mouth 2 (two) times daily.        Disposition and follow-up:   Thomas [name] was discharged from [facility] in stable condition.  At the hospital follow up visit please address:  1.  Please investigate outpatient insulin regimen. It is unclear exactly what regimen patient follows and patient's insulin demands have likely decreased due to ESRD.  2.  Labs / imaging needed at time of follow-up: Renal function panel  3.  Pending labs/ test needing follow-up: None  Follow-up Appointments: Follow-up Information     Medicine, [facility] Internal Follow up.   Specialty: Internal Medicine Why: Please schedule a follow up appointment with your regular doctor for an appointment within the next 1-2 weeks. Contact information: [address] [place] [address] [phone]         Home, [facility] Follow up.   Specialty: Home Health Services Why: HHPT Contact information: [place] [address] [address] Hospital Course by problem list:  # Generalized weakness: Occurring in setting of uremia due to missed HD sessions. Patient presented with generalized weakness and subjective fevers for 2 days and a neutrophil predominant leukocytosis was present on admission.  A left arm basilic vein fistula was placed in a right IJ dialysis catheter insertion on 01/21/2019 with subsequent missing of HD sessions.  There was no evidence for infection at surgical site.  Patient had improvement with initiation of inpatient dialysis.  Blood cultures x2 were negative at 5 days, COVID screening negative, C. difficile negative (there was concern  as patient developed diarrhea during hospitalization).  Patient received 5 days of vancomycin and 6 days of ceftriaxone as there was concern for possible pneumonia with hypoxia and elevated procalcitonin.  Patient had improvement in functional status and was on his home oxygen support at time of discharge.  # ESRD on HD: Patient with history of ESRD on hemodialysis Monday Wednesday Friday.  He missed a few dialysis sessions prior to presentation.  Per daughter, patient's dry weight is 95-96 kg. Patient received multiple HD sessions as inpatient. Patient weight this morning after dialysis 90.3 kg.  # Potential cellulitis: Patient had diffuse erythema around catheter site on upper right chest which was thought to be secondary to tape allergy but given near resolution following antibiotic course (vancomycin+rocephin) this may have represented a cellulitis.  # T2DM: Patient with history of type 2 diabetes mellitus taking Lantus 45-65 units nightly (based on glucose level) +  Humalog 10U TID. As inpatient, patient received 10 units basal insulin with lantus + SSI.  Discharge Vitals:   BP (!) 143/69 (BP Location: Right Arm)    Pulse 78    Temp (!) 97.4 F (36.3 C) (Oral)    Resp 20    Ht 5\' 11"  (1.803 m)    Wt 90.3 kg    SpO2 97%    BMI 27.77 kg/m   Pertinent Labs, Studies, and Procedures:  CBC Latest Ref Rng & Units 01/30/2019 01/29/2019 01/29/2019  WBC 4.0 - 10.5 K/uL 8.6 9.9 9.4  Hemoglobin 13.0 - 17.0 g/dL 9.5(L) 9.3(L) 9.0(L)  Hematocrit 39.0 - 52.0 % 30.9(L) 30.2(L) 30.0(L)  Platelets 150 - 400 K/uL 270 299 258   BMP Latest Ref Rng & Units 01/30/2019 01/29/2019 01/29/2019  Glucose 70 - 99 mg/dL 144(H) 133(H) 136(H)  BUN 8 - 23 mg/dL 64(H) 51(H) 39(H)  Creatinine 0.61 - 1.24 mg/dL 7.74(H) 6.45(H) 5.35(H)  Sodium 135 - 145 mmol/L 141 139 138  Potassium 3.5 - 5.1 mmol/L 4.2 4.1 4.1  Chloride 98 - 111 mmol/L 97(L) 98 97(L)  CO2 22 - 32 mmol/L 25 25 25   Calcium 8.9 - 10.3 mg/dL 8.9 8.8(L) 8.6(L)     CXR (01/24/19): FINDINGS: Right dialysis catheter in place, unchanged. Mild cardiomegaly and vascular congestion. Left base opacity, likely atelectasis. No overt edema or effusions. No acute bony abnormality.   IMPRESSION: Cardiomegaly, vascular congestion.   Left base opacity, likely atelectasis.   Blood culture (8/22 x2): NG at 5 days C diff: negative    Discharge Instructions: Discharge Instructions     Call MD for:  difficulty breathing, headache or visual disturbances   Complete by: As directed    Call MD for:  extreme fatigue   Complete by: As directed    Call MD for:  persistant dizziness or light-headedness   Complete by: As directed    Call MD for:  persistant nausea and vomiting   Complete by: As directed    Call MD for:  temperature >100.4   Complete by: As directed    Discharge instructions   Complete by: As directed    You were seen in the hospital for weakness and malaise. You were treated with antibiotics and your symptoms improved. It is important that you take your medications as prescribed and followup with your primary care provider. It is also important that you resume your outpatient dialysis. You were taking a lot of benadryl before your admission. We would like you to stop this medication and try using the benadryl cream that you were using in the hospital. We have sent a prescription for this to your pharmacy.   Face-to-face encounter (required for Medicare/Medicaid patients)   Complete by: As directed    I Jeanmarie Hubert certify that this patient is under my care and that I, or a nurse practitioner or physician's assistant working with me, had a face-to-face encounter that meets the physician face-to-face encounter requirements with this patient on 01/30/2019. The encounter with the patient was in whole, or in part for the following medical condition(s) which is the primary reason for home health care (List medical condition): COPD, functional decline    The encounter with the patient was in whole, or in part, for the following medical condition, which is the primary reason for home health care: COPD, weakness   I certify that, based on my findings, the following services are medically necessary home health services: Physical therapy   Reason for  Medically Necessary Home Health Services: Skilled Nursing- Change/Decline in Patient Status   My clinical findings support the need for the above services: Shortness of breath with activity   Further, I certify that my clinical findings support that this patient is homebound due to: Shortness of Breath with activity   Home Health   Complete by: As directed    To provide the following care/treatments: PT   Increase activity slowly   Complete by: As directed    Increase activity slowly   Complete by: As directed        Signed: Jeanmarie Hubert, MD 01/30/2019, 3:42 PM   Pager: [phone]

## 2019-01-30 NOTE — Plan of Care (Signed)

## 2019-01-30 NOTE — Progress Notes (Signed)
Subjective: Patient examined at bedside this morning. He appears to be doing well and in no acute distress. Patient reports that his nausea is improved from yesterday. Denies emesis but reports occasional dry heaving. Patient also reports his shortness of breath has improved and patient thinks he is ready to return home.   Objective:  Vital signs in last 24 hours: Vitals:   01/30/19 0800 01/30/19 0809 01/30/19 0830 01/30/19 0900  BP: (!) 143/75 125/64 136/70 (!) 145/69  Pulse: 71 77 79 80  Resp: 14 14 14 17   Temp:      TempSrc:      SpO2: 92% 92% 92% 92%  Weight:      Height:       Physical Exam Constitutional:      General: He is not in acute distress.    Appearance: He is obese.  Cardiovascular:     Rate and Rhythm: Normal rate and regular rhythm.     Pulses: Normal pulses.     Heart sounds: No murmur. No friction rub. No gallop.   Pulmonary:     Effort: Pulmonary effort is normal.     Comments: Mild expiratory wheezing in all lung fields on auscultation, improving  Abdominal:     General: Bowel sounds are normal. There is no distension.     Tenderness: There is no abdominal tenderness. There is no guarding.  Skin:    General: Skin is warm and dry.     Comments: Erythema on right chest around right IJ port significantly improved  Erythema on left arm in area of fistula consistent with post op bruising but no warmth noted, no drainage noted from incisions     Neurological:     Mental Status: He is alert.     Assessment/Plan:  Patient is a 78 yo male with COPD, CAD, DM, SCC of nosed/p rhinectomy, ESRD on HD that presented with generalized weakness, fever and hypoxia from home. Improved with dialysis. Patient improved and plan for discharge today.  Generalized Weaknessin setting of uremia due to missed HD:  Patient presentedwith generalized weakness andsubjectivefever for 2 days.Neutrophil predominant leukocytosis on admission.Left arm basilic vein fistula  placement and right IJ dialysis catheter insertion on 01/21/2019. He had an incomplete hemodialysis session on 01/22/2019 and was too weak to go to dialysis 01/23/2019.Healing surgical incisionson left armsurrounded byappropriate post op bruising.No active drainage noted from incision sites.Fistula with good thrill.  Patient on vancomycinand Rocephin. WBC 8.6this AM. Procal 4.48>4.39>3.68>3.15. Blood cultures with no growth to date. Unclear etiology.  Nausea improved with no zofran, last phenergan given yesterday at 132pm.  No abdominal pain this morning.   Patient is ESRD on HD. Improvement in symptoms with HD and received HD today.  - Will resume outpatient HD schedule - Discharge today  ESRD on HD:  Patient with Hx of ESRD on HD MWF. He missed a few dialysis sessions prior to presentation. He has been tolerating dialysis well. Per daughter, patient's outpatient dialysis weight is 95-96kg. - Continue calcium acetate - HD per nephrology   COPD:  Patient has Hx of COPD and uses 3L home O2 while sleeping and as needed during daytime.  Patient also uses albuterol daily and has daily nebulizer treatments. Also uses Stiolto respimat 2 puffs qHS. He has morning productive cough at baseline unchanged.Patient breathing improved on examination this morning and lung exam with mild expiratory wheezing noted. Patient on aerosolized face tent on 8L with 94-97% saturation.   -Duoneb 31mL neb q6h, Anoro  1 puff qd  - Continue to monitor respiratory status  Hypertension:  Patient with Hx of HTN on amlodipine 10mg  qd, metoprolol 100mg  qd, HCTZ 25mg  qd, IMDUR 30mg  qd, and losartan 100mg  qd.  - Amlodipine 10mg  qd   Type II DM:  Patient with Hx of type II DM. Patient on Lantus 45-65U qHS + Humalog 10U tid. Here, patient getting Lantus 10U + Novolog 4u tid + SSI. CBG<200 - Lantus 10U + Novolog 4u tid + SSI  Tape allergy:  Diffuse erythema around catheter and upper right chest without warmth,  pain or tenderness to area. Per daughter, this is chronic and patient requires benadryl for symptomatic control. This erythema has improved substantially from admission. Possible that it was 2/2 infection and improved with the antibiotics.  Diabetic neuropathy: Patient has hx of diabetic neuropathy and is on gabapentin at home. On examination, gross sensation to touch is absent in bilateral lower extremities.  - Gabapentin 100mg  bid   DVT Prophylaxis: Heparin  Diet: Regular diet  Code: Full  Dispo: Discharge today  Jeanmarie Hubert, MD  Internal Medicine, PGY1  01/30/2019, 9:25 AM Pager: [phone]

## 2019-01-30 NOTE — Evaluation (Signed)
Occupational Therapy Evaluation and Discharge Patient Details Name: Thomas [name] MRN: [id number] DOB: 12/03/1940 Today's Date: 01/30/2019    History of Present Illness 78 yo admitted with hypoxia and generalized weakness due to uremia from missed HD. PMhx: ESRD on HD MWF, COPD on 3L at home, CAD, DM, neuropathy, nose cancer with nose amputation.   Clinical Impression   This 78 yo male admitted with above presents to acute OT with decreased endurance for activity, decreased balance and decreased mobility all affecting his safety and independence with basic ADLs with pt normally being able to do these tasks by himself from a RW level and now he is Mod A to transfer, min A sit>stand, and needs A for all basic ADLs. Pt is to D/C home today so recommend the rest of therapy to [facility].    Follow Up Recommendations  Home health OT;Supervision/Assistance - 24 hour    Equipment Recommendations  None recommended by OT       Precautions / Restrictions Precautions Precautions: Fall Restrictions Weight Bearing Restrictions: No      Mobility Bed Mobility Overal bed mobility: Needs Assistance Bed Mobility: Supine to Sit;Sit to Supine     Supine to sit: Mod assist Sit to supine: Min assist      Transfers Overall transfer level: Needs assistance Equipment used: Rolling walker (2 wheeled) Transfers: Sit to/from Stand Sit to Stand: Min assist              Balance Overall balance assessment: Needs assistance Sitting-balance support: No upper extremity supported;Feet supported Sitting balance-Leahy Scale: Fair     Standing balance support: Bilateral upper extremity supported Standing balance-Leahy Scale: Poor                             ADL either performed or assessed with clinical judgement   ADL Overall ADL's : Needs assistance/impaired Eating/Feeding: Set up;Sitting   Grooming: Moderate assistance;Sitting   Upper Body Bathing: Moderate assistance;Sitting    Lower Body Bathing: Total assistance Lower Body Bathing Details (indicate cue type and reason): Min A sit<>stand Upper Body Dressing : Total assistance;Sitting   Lower Body Dressing: Total assistance Lower Body Dressing Details (indicate cue type and reason): Min A sit<>stand Toilet Transfer: Moderate assistance;Stand-pivot   Toileting- Clothing Manipulation and Hygiene: Total assistance Toileting - Clothing Manipulation Details (indicate cue type and reason): Min A sit<>stand             Vision Patient Visual Report: No change from baseline              Pertinent Vitals/Pain Pain Assessment: No/denies pain     Hand Dominance Right   Extremity/Trunk Assessment Upper Extremity Assessment Upper Extremity Assessment: Generalized weakness   Lower Extremity Assessment Lower Extremity Assessment: Generalized weakness       Communication Communication Communication: No difficulties   Cognition Arousal/Alertness: Awake/alert Behavior During Therapy: WFL for tasks assessed/performed Overall Cognitive Status: Within Functional Limits for tasks assessed                                                Home Living Family/patient expects to be discharged to:: Private residence Living Arrangements: Spouse/significant other;Children(dtr) Available Help at Discharge: Family;Available PRN/intermittently(wife cannot A pt due to back surgeries) Type of Home: House Home Access: Ramped entrance  Home Layout: One level         Bathroom Toilet: Standard     Home Equipment: Walker - 2 wheels;Wheelchair - manual          Prior Functioning/Environment Level of Independence: Independent with assistive device(s)                 OT Problem List: Cardiopulmonary status limiting activity;Impaired balance (sitting and/or standing);Decreased activity tolerance         OT Goals(Current goals can be found in the care plan section) Acute Rehab OT  Goals Patient Stated Goal: to go home today, but wants to feel better and wife want HH set up  OT Frequency:                AM-PAC OT "6 Clicks" Daily Activity     Outcome Measure Help from another person eating meals?: A Little Help from another person taking care of personal grooming?: A Lot Help from another person toileting, which includes using toliet, bedpan, or urinal?: A Lot Help from another person bathing (including washing, rinsing, drying)?: A Lot Help from another person to put on and taking off regular upper body clothing?: A Lot Help from another person to put on and taking off regular lower body clothing?: A Lot 6 Click Score: 13   End of Session Equipment Utilized During Treatment: Surveyor, mining Communication: (waiting on CM to get [place] set up)  Activity Tolerance: (limited by increased work of breathing with activity and nausea --got sick in dialysis) Patient left: in bed;with call bell/phone within reach;with family/visitor present  OT Visit Diagnosis: Unsteadiness on feet (R26.81);Other abnormalities of gait and mobility (R26.89);Muscle weakness (generalized) (M62.81)                Time: 1594-5859 OT Time Calculation (min): 32 min Charges:  OT General Charges $OT Visit: 1 Visit OT Evaluation $OT Eval Moderate Complexity: 1 Mod OT Treatments $Self Care/Home Management : 8-22 mins  [address], OTR/L Acute [employer] Pager [phone] Office [phone]     Almon Register 01/30/2019, 4:42 PM

## 2019-01-30 NOTE — Care Management Important Message (Signed)
Important Message  Patient Details  Name: Thomas [name] MRN: [id number] Date of Birth: [birth year]   Medicare Important Message Given:  Yes     Shelda Altes 01/30/2019, 2:04 PM

## 2019-01-30 NOTE — Progress Notes (Signed)
Pt was transported to dialysis using a venturi mask set up. Pt stated he was comfortable. Will continue to monitor.

## 2019-01-30 NOTE — Progress Notes (Signed)
[facility] ROUNDING NOTE   Subjective:   This is a 78 year old gentleman that is end-stage renal disease Monday Wednesday Friday dialysis goes to [facility].  He was admitted with fever rule out sepsis possible pneumonia blood cultures negative to date.  He had skipped 2 dialysis treatments last week.   He received dialysis 01/28/2019 with 2 L ultrafiltrate it.  He appeared to tolerate this well.  He is scheduled for dialysis 01/30/2019  Blood pressure 136/70 pulse 79 temperature 97.6 O2 sats 92% facemask  Sodium 142 potassium 4.2 chloride 97 CO2 25 BUN 64 creatinine 7.74 glucose 144 calcium 8.9 phosphorus 8.5 albumin 2.6 WBC 8.6 hemoglobin 9.5 platelets 270  Amlodipine 10 mg daily, calcium acetate 1.334 g 3 times daily with meals, Lantus 10 units q. at bedtime, DuoNeb every 6 hours, metoprolol 25 mg twice daily, Anora Ellipta 1 puff daily.  Darbepoetin 60 mcg administered 01/26/2019.  IV iron Monday Wednesday Friday.  IV Rocephin 1 g every 24 hours IV vancomycin 1 g per pharmacy  Objective:  Vital signs in last 24 hours:  Temp:  [97.6 F (36.4 C)-98.3 F (36.8 C)] 97.6 F (36.4 C) (08/28 0735) Pulse Rate:  [71-85] 79 (08/28 0830) Resp:  [12-20] 14 (08/28 0830) BP: (125-176)/(64-77) 136/70 (08/28 0830) SpO2:  [92 %-100 %] 92 % (08/28 0830) Weight:  [90.9 kg-96.9 kg] 90.9 kg (08/28 0735)  Weight change: 0 kg Filed Weights   01/29/19 0353 01/30/19 0416 01/30/19 0735  Weight: 93.8 kg 96.9 kg 90.9 kg    Intake/Output: I/O last 3 completed shifts: In: 862.1 [P.O.:350; IV Piggyback:512.1] Out: 0    Intake/Output this shift:  No intake/output data recorded.  Genheavy set WM, drowsy and falling asleep during converstation no resp distress No jvd or bruits Chest clear bilatto bases, no rales or wheezing Ant chest R pericath erythema, no pus or drainage RRR no MRG Abd soft ntnd no mass or ascites +bsobese Extno LE or UEedema Neuro is lethargic,  arousable, myoclonic jerking may be a bit better today Ox 3 LUE AVF+bruit, +bruising    Basic Metabolic Panel: Recent Labs  Lab 01/25/19 0528  01/27/19 0431 01/28/19 0402 01/29/19 0443 01/29/19 1524 01/30/19 0459  NA 136   < > 135 139 138 139 141  K 4.0   < > 4.2 3.9 4.1 4.1 4.2  CL 93*   < > 94* 96* 97* 98 97*  CO2 17*   < > 22 24 25 25 25   GLUCOSE 154*   < > 179* 143* 136* 133* 144*  BUN 78*   < > 35* 63* 39* 51* 64*  CREATININE 13.27*   < > 6.33* 8.02* 5.35* 6.45* 7.74*  CALCIUM 7.1*   < > 8.3* 8.6* 8.6* 8.8* 8.9  MG 1.5*  --   --   --   --   --   --   PHOS >30.0*   < > 7.6* 8.9* 5.9* 6.9* 8.5*   < > = values in this interval not displayed.    Liver Function Tests: Recent Labs  Lab 01/24/19 1443  01/27/19 0431 01/28/19 0402 01/29/19 0443 01/29/19 1524 01/30/19 0459  AST 42*  --   --   --   --   --   --   ALT 8  --   --   --   --   --   --   ALKPHOS 89  --   --   --   --   --   --  BILITOT 0.5  --   --   --   --   --   --   PROT 6.5  --   --   --   --   --   --   ALBUMIN 2.8*   < > 2.5* 2.6* 2.5* 2.5* 2.6*   < > = values in this interval not displayed.   No results for input(s): LIPASE, AMYLASE in the last 168 hours. No results for input(s): AMMONIA in the last 168 hours.  CBC: Recent Labs  Lab 01/24/19 1443  01/26/19 0522 01/27/19 0431 01/28/19 0402 01/29/19 0443 01/29/19 1524 01/30/19 0459  WBC 13.1*   < > 11.6* 9.6 10.1 9.4 9.9 8.6  NEUTROABS 10.5*  --  9.0* 7.8* 7.1 7.7  --   --   HGB 9.2*   < > 8.9* 9.0* 9.3* 9.0* 9.3* 9.5*  HCT 29.2*   < > 28.7* 28.8* 30.5* 30.0* 30.2* 30.9*  MCV 91.0   < > 92.3 91.7 93.3 94.0 92.1 92.0  PLT 276   < > 253 261 324 258 299 270   < > = values in this interval not displayed.    Cardiac Enzymes: Recent Labs  Lab 01/25/19 0528  CKTOTAL 173    BNP: Invalid input(s): POCBNP  CBG: Recent Labs  Lab 01/29/19 1158 01/29/19 1732 01/29/19 2117 01/29/19 2334 01/30/19 0626  GLUCAP 149* 127* 110* 139* 125*     Microbiology: Results for orders placed or performed during the hospital encounter of 01/24/19  Blood Culture (routine x 2)     Status: None   Collection Time: 01/24/19  1:56 PM   Specimen: BLOOD  Result Value Ref Range Status   Specimen Description BLOOD RIGHT ANTECUBITAL  Final   Special Requests   Final    BOTTLES DRAWN AEROBIC AND ANAEROBIC Blood Culture adequate volume   Culture   Final    NO GROWTH 5 DAYS Performed at [facility], [place] [address] [place], [place] [address]    Report Status 01/29/2019 FINAL  Final  Blood Culture (routine x 2)     Status: None   Collection Time: 01/24/19  2:01 PM   Specimen: BLOOD  Result Value Ref Range Status   Specimen Description BLOOD BLOOD RIGHT HAND  Final   Special Requests   Final    BOTTLES DRAWN AEROBIC AND ANAEROBIC Blood Culture adequate volume   Culture   Final    NO GROWTH 5 DAYS Performed at [facility], [place] [address], [place], [place] [address]    Report Status 01/29/2019 FINAL  Final  Novel Coronavirus, NAA (hospital order; send-out to ref lab)     Status: None   Collection Time: 01/24/19  2:43 PM  Result Value Ref Range Status   SARS-CoV-2, NAA NOT DETECTED NOT DETECTED Final    Comment: (NOTE) This test was developed and its performance characteristics determined by [employer]. This test has not been FDA cleared or approved. This test has been authorized by FDA under an Emergency Use Authorization (EUA). This test is only authorized for the duration of time the declaration that circumstances exist justifying the authorization of the emergency use of in vitro diagnostic tests for detection of SARS-CoV-2 virus and/or diagnosis of COVID-19 infection under section 564(b)(1) of the Act, 21 U.S.C. 638GTX-6(I)(6), unless the authorization is terminated or revoked sooner. When diagnostic testing is negative, the possibility of a false negative result should be considered in the context of  a patient's recent exposures and the presence of clinical signs and symptoms consistent with COVID-19. An individual without symptoms of COVID-19 and who is not shedding SARS-CoV-2 virus would expect to have a negative (not detected) result in this assay. Performed  At: [facility] [address] [place], [place] [id number] Rush Farmer MD LZ:[id number]    [place]  Final    Comment: Performed at [facility], [place] [address], [place], [place] [address]  C difficile quick scan w PCR reflex     Status: None   Collection Time: 01/29/19  6:37 PM   Specimen: STOOL  Result Value Ref Range Status   C Diff antigen NEGATIVE NEGATIVE Final   C Diff toxin NEGATIVE NEGATIVE Final   C Diff interpretation No C. difficile detected.  Final    Comment: Performed at [facility], [place] [address], [place], [place] [address]    Coagulation Studies: No results for input(s): LABPROT, INR in the last 72 hours.  Urinalysis: No results for input(s): COLORURINE, LABSPEC, PHURINE, GLUCOSEU, HGBUR, BILIRUBINUR, KETONESUR, PROTEINUR, UROBILINOGEN, NITRITE, LEUKOCYTESUR in the last 72 hours.  Invalid input(s): APPERANCEUR    Imaging: No results found.   Medications:   . sodium chloride    . sodium chloride    . sodium chloride    . sodium chloride    . ferric gluconate (FERRLECIT/NULECIT) IV Stopped (01/28/19 1838)   . amLODipine  10 mg Oral Daily  . calcium acetate  1,334 mg Oral TID WC  . Chlorhexidine Gluconate Cloth  6 each Topical Q0600  . darbepoetin (ARANESP) injection - DIALYSIS  60 mcg Intravenous Q Mon-HD  . feeding supplement (PRO-STAT SUGAR FREE 64)  30 mL Oral TID BM  . heparin  5,000 Units Subcutaneous Q8H  . insulin aspart  0-15 Units Subcutaneous TID WC  . insulin aspart  0-5 Units Subcutaneous QHS  . insulin aspart  4 Units Subcutaneous TID WC  . insulin glargine  10 Units Subcutaneous QHS  . ipratropium-albuterol  3 mL  Nebulization BID  . loratadine  10 mg Oral Q48H  . metoprolol tartrate  25 mg Oral BID  . multivitamin  1 tablet Oral QHS   sodium chloride, sodium chloride, sodium chloride, sodium chloride, acetaminophen **OR** acetaminophen, albuterol, alteplase, alteplase, calcium acetate, diphenhydrAMINE-zinc acetate, gabapentin, heparin, heparin, lidocaine (PF), lidocaine (PF), lidocaine-prilocaine, lidocaine-prilocaine, ondansetron (ZOFRAN) IV, pentafluoroprop-tetrafluoroeth, pentafluoroprop-tetrafluoroeth, promethazine  Assessment/ Plan:   End-stage renal disease Monday Wednesday Friday dialysis had skipped 2 dialysis treatments at the end of last week he attends [facility] dialysis clinic.   Completed successful dialysis 01/28/2019 with ultrafiltration of 2 L dialysis scheduled 01/30/2019  Febrile illness  Rocephin 1 g daily and vancomycin per pharmacy.  No pathogens to date  COPD continues inhaler therapy  Hypertension added amlodipine 10 mg nightly.  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  I believe this looks like it could be cannulated.  He does have a dialysis catheter right IJ.   LOS: [place] @TODAY @8 :56 AM

## 2019-01-30 NOTE — TOC Initial Note (Addendum)
Transition of Care [facility]) - Initial/Assessment Note    Patient Details  Name: Thomas [name] MRN: [id number] Date of Birth: 1941/03/27  Transition of [facility]) CM/SW Contact:    Zenon Mayo, RN Phone Number: 01/30/2019, 1:47 PM  Clinical Narrative:                 Patient from home with daughter, he states he has home oxygen with [employer], his daughter will transport him home and she will be bringing his oxygen for him go home with. Daughter would like for patient to have HHPT, NCM will contact MD to put in order for HHPT, she does not have a preference of the agency.  NCM made referral to [facility], , they were able to accept referral and this information was given to daughter Jonelle Sidle.   Expected Discharge Plan: Home/Self Care Barriers to Discharge: No Barriers Identified   Patient Goals and CMS Choice Patient states their goals for this hospitalization and ongoing recovery are:: get home   Choice offered to / list presented to : NA  Expected Discharge Plan and Services Expected Discharge Plan: Home/Self Care In-house Referral: NA Discharge Planning Services: CM Consult Post Acute Care Choice: NA Living arrangements for the past 2 months: Single Family Home Expected Discharge Date: 01/30/19               DME Arranged: (NA)         HH Arranged: NA          Prior Living Arrangements/Services Living arrangements for the past 2 months: Single Family Home Lives with:: Adult Children Patient language and need for interpreter reviewed:: Yes Do you feel safe going back to the place where you live?: Yes      Need for Family Participation in Patient Care: Yes (Comment) Care giver support system in place?: Yes (comment) Current home services: DME(home oxygen with [employer]) Criminal Activity/Legal Involvement Pertinent to Current Situation/Hospitalization: No - Comment as needed  Activities of Daily Living Home Assistive Devices/Equipment: None, Walker (specify type) ADL  Screening (condition at time of admission) Patient's cognitive ability adequate to safely complete daily activities?: Yes Is the patient deaf or have difficulty hearing?: No Does the patient have difficulty seeing, even when wearing glasses/contacts?: No Does the patient have difficulty concentrating, remembering, or making decisions?: No Patient able to express need for assistance with ADLs?: Yes Does the patient have difficulty dressing or bathing?: Yes Independently performs ADLs?: Yes (appropriate for developmental age) Does the patient have difficulty walking or climbing stairs?: Yes Weakness of Legs: Both Weakness of Arms/Hands: None  Permission Sought/Granted                  Emotional Assessment Appearance:: Appears stated age Attitude/Demeanor/Rapport: Gracious Affect (typically observed): Appropriate Orientation: : Oriented to Self, Oriented to Place, Oriented to  Time, Oriented to Situation Alcohol / Substance Use: Not Applicable Psych Involvement: No (comment)  Admission diagnosis:  Hypoxia [R09.02] ESRD (end stage renal disease) on dialysis ([place]) [N18.6, Z99.2] Acute febrile illness [R50.9] Generalized weakness [R53.1] Patient Active Problem List   Diagnosis Date Noted  . Volume overload 01/26/2019  . Pneumonia 01/25/2019  . Generalized weakness 01/24/2019  . Congestive heart failure ([place]) 05/11/2016  . Venous stenosis of left upper extremity 05/11/2016  . End stage renal disease ([place]) 06/19/2013  . Rectal bleeding 11/27/2012  . Upper abdominal pain 07/07/2012  . Anemia 07/07/2012  . Chronic nausea 07/07/2012  . Microscopic colitis 03/07/2012  . DIVERTICULITIS, HX  OF 02/01/2010  . CONSTIPATION 09/10/2008  . ABDOMINAL PAIN, LOWER 09/10/2008  . DIABETES MELLITUS, TYPE II 09/09/2008  . CIGARETTE SMOKER 09/09/2008  . CORONARY ARTERY DISEASE 09/09/2008  . 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 Esophageal reflux 09/09/2008  . HIATAL HERNIA 09/09/2008  . HOARSENESS  09/09/2008   PCP:  Medicine, Ledell Noss Internal Pharmacy:   [place], [place] [address] [place] [address] Phone: [phone] Fax: [phone]     Social Determinants of Health (SDOH) Interventions    Readmission Risk Interventions Readmission Risk Prevention Plan 01/30/2019  Transportation Screening Complete  PCP or Specialist Appt within 3-5 Days Complete  HRI or [place] Complete  Social Work Consult for [place] Planning/Counseling Complete  Palliative Care Screening Not Applicable  Medication Review Press photographer) Complete  Some recent data might be hidden

## 2019-01-30 NOTE — Progress Notes (Signed)
Renal Navigator notes patient discharge order and note. Records faxed to [place] [facility] clinic in order to provide continuity of care.  Alphonzo Cruise, [place] Renal Navigator [phone]

## 2019-02-04 ENCOUNTER — Other Ambulatory Visit (HOSPITAL_COMMUNITY): Payer: Self-pay | Admitting: Medical

## 2019-02-04 ENCOUNTER — Encounter (HOSPITAL_COMMUNITY): Payer: Self-pay | Admitting: Diagnostic Radiology

## 2019-02-04 ENCOUNTER — Ambulatory Visit (HOSPITAL_COMMUNITY)
Admission: RE | Admit: 2019-02-04 | Discharge: 2019-02-04 | Disposition: A | Discharge status: Home / Self Care | Payer: Medicare Other | Source: Ambulatory Visit | Attending: Medical | Admitting: Medical

## 2019-02-04 ENCOUNTER — Other Ambulatory Visit: Payer: Self-pay

## 2019-02-04 DIAGNOSIS — Z992 Dependence on renal dialysis: Secondary | ICD-10-CM | POA: Insufficient documentation

## 2019-02-04 DIAGNOSIS — N186 End stage renal disease: Secondary | ICD-10-CM

## 2019-02-04 DIAGNOSIS — Z452 Encounter for adjustment and management of vascular access device: Secondary | ICD-10-CM | POA: Diagnosis present

## 2019-02-04 HISTORY — PX: IR FLUORO GUIDE CV LINE RIGHT: IMG2283

## 2019-02-04 MED ORDER — HEPARIN SODIUM (PORCINE) 1000 UNIT/ML IJ SOLN
INTRAMUSCULAR | Status: AC
  Administered 2019-02-04: 3.8 mL
  Filled 2019-02-04: qty 1

## 2019-02-04 MED ORDER — CEFAZOLIN SODIUM-DEXTROSE 2-4 GM/100ML-% IV SOLN
INTRAVENOUS | Status: AC
  Filled 2019-02-04: qty 100

## 2019-02-04 MED ORDER — CHLORHEXIDINE GLUCONATE 4 % EX LIQD
CUTANEOUS | Status: AC
  Filled 2019-02-04: qty 15

## 2019-02-04 MED ORDER — LIDOCAINE HCL 1 % IJ SOLN
INTRAMUSCULAR | Status: AC
  Filled 2019-02-04: qty 20

## 2019-02-04 MED ORDER — LIDOCAINE HCL (PF) 1 % IJ SOLN
INTRAMUSCULAR | Status: DC | PRN
  Administered 2019-02-04: 5 mL

## 2019-02-04 MED ORDER — CEFAZOLIN SODIUM-DEXTROSE 2-4 GM/100ML-% IV SOLN: 2.0000 g | Freq: Three times a day (TID) | INTRAVENOUS | Status: DC

## 2019-02-04 NOTE — Procedures (Signed)
Interventional Radiology Procedure:   Indications: Poorly functioning right chest tunneled dialysis catheter  Procedure: Exchange of tunneled dialysis catheter  Findings: Tip was in mid SVC. Longer catheter was selected and placed at SVC/RA junction.  23 cm Palindrome catheter used.  Both lumens aspirate and flush well.    Complications: None     EBL: Less than 20 ml  Plan: New HD catheter is ready to use.  Jasmane Brockway R. Anselm Pancoast, MD  Pager: [phone]

## 2019-02-23 ENCOUNTER — Other Ambulatory Visit: Payer: Self-pay

## 2019-02-23 ENCOUNTER — Ambulatory Visit (HOSPITAL_COMMUNITY)
Admission: RE | Admit: 2019-02-23 | Discharge: 2019-02-23 | Disposition: A | Discharge status: Home / Self Care | Payer: Medicare Other | Source: Ambulatory Visit | Attending: Family | Admitting: Family

## 2019-02-23 ENCOUNTER — Ambulatory Visit (INDEPENDENT_AMBULATORY_CARE_PROVIDER_SITE_OTHER): Payer: Self-pay | Admitting: Physician Assistant

## 2019-02-23 VITALS — BP 119/57 | HR 69 | Temp 98.2°F | Resp 18 | Ht 71.0 in | Wt 198.0 lb

## 2019-02-23 DIAGNOSIS — N186 End stage renal disease: Secondary | ICD-10-CM

## 2019-02-23 DIAGNOSIS — Z992 Dependence on renal dialysis: Secondary | ICD-10-CM

## 2019-02-23 NOTE — Progress Notes (Signed)
POST OPERATIVE OFFICE NOTE    CC:  F/u for surgery  HPI:  This is a 78 y.o. 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 Donzetta Matters.  The basilic vein was large and extended from the antecubital space up the arm.  Dr. Donzetta Matters felt that we could consider using the fistula at 4-6 weeks given it was mostly matured from previous fistula.  He had a left RC AVF creation on 06/29/13 by Dr. Bridgett Larsson & subsequent branch ligation and open thrombectomy of left RC AVF on 11/27/2018 by Dr. Trula Slade.   He presents today for follow up.  He states he has some numbness in his fingers, but this was present prior to this surgery and hasn't worsened.  He states that his arm was bruised and swollen after surgery, but has gotten better.  He still tender on his upper arm around the distal incision.  His daughter states that there is an area that is not healed yet.  They are using soap and water with abx ointment.  She states it has gotten much better.  He denies any fevers.   Allergies  Allergen Reactions  . Ace Inhibitors Anaphylaxis    Swelling in throat and neck  . Statins Anaphylaxis and Swelling    Caused feet, tongue and throat to swell.  . Codeine Other (See Comments)    Muscle Spasms    Current Outpatient Medications  Medication Sig Dispense Refill  . acetaminophen (TYLENOL) 650 MG CR tablet Take 1,300 mg by mouth every 8 (eight) hours as needed for pain.     Marland Kitchen albuterol (PROVENTIL,VENTOLIN) 2 MG/5ML syrup Take 2 mg by mouth 2 (two) times a day.     . albuterol (VENTOLIN HFA) 108 (90 Base) MCG/ACT inhaler Inhale 1-2 puffs into the lungs every 4 (four) hours as needed for wheezing or shortness of breath.    . ALPRAZolam (XANAX) 1 MG tablet Take 1 mg by mouth at bedtime as needed for sleep.     Marland Kitchen amLODipine (NORVASC) 10 MG tablet Take 10 mg by mouth daily at 12 noon.     Marland Kitchen b complex-vitamin c-folic acid (NEPHRO-VITE) 0.8 MG TABS tablet Take 1 tablet by mouth daily.    .  butalbital-acetaminophen-caffeine (FIORICET, ESGIC) 50-325-40 MG per tablet Take 1 tablet by mouth every 6 (six) hours as needed for headache.     . calcium acetate (PHOSLO) 667 MG capsule Take 667-1,334 mg by mouth See admin instructions. Take 1334 mg with each meal and 667 mg with each snack    . Calcium Carb-Cholecalciferol (CALCIUM + D3 PO) Take 1 tablet by mouth daily.     . Cholecalciferol (VITAMIN D3) 50 MCG (2000 UT) capsule Take 4,000 Units by mouth 2 (two) times daily.     Marland Kitchen dicyclomine (BENTYL) 20 MG tablet Take 20 mg by mouth 4 (four) times daily as needed for spasms.     . diphenhydrAMINE-zinc acetate (BENADRYL) cream Apply topically 2 (two) times daily as needed for itching. 28.4 g 0  . gabapentin (NEURONTIN) 300 MG capsule Take 300 mg by mouth See admin instructions. Take 300 mg daily, may take an additional 300 mg dose up to 2 times daily as needed for pain    . hydrochlorothiazide (HYDRODIURIL) 25 MG tablet Take 25 mg by mouth daily.    . insulin lispro (HUMALOG) 100 UNIT/ML injection Inject 10 Units into the skin 3 (three) times daily with meals.     Marland Kitchen  isosorbide mononitrate (IMDUR) 30 MG 24 hr tablet Take 30 mg by mouth daily.     Marland Kitchen LANTUS SOLOSTAR 100 UNIT/ML injection Inject 45-65 Units into the skin at bedtime. According to sugar level.    . lidocaine-prilocaine (EMLA) cream Apply 1 application topically as needed (port access).    Marland Kitchen loperamide (IMODIUM) 1 MG/5ML solution Take 1-2 mg by mouth as needed for diarrhea or loose stools.    Marland Kitchen losartan (COZAAR) 100 MG tablet Take 100 mg by mouth daily.     . magnesium oxide (MAG-OX) 400 MG tablet Take 400 mg by mouth at bedtime.     . metoprolol (LOPRESSOR) 100 MG tablet Take 100 mg by mouth 2 (two) times daily.     . Naphazoline-Glycerin (REDNESS RELIEF OP) Place 1 drop into both eyes daily as needed (redness/ dryness).    Marland Kitchen NITROSTAT 0.4 MG SL tablet Place 0.4 mg under the tongue every 5 (five) minutes as needed for chest pain.      . Omega-3 Fatty Acids (FISH OIL) 1000 MG CAPS Take 2,000 mg by mouth 2 (two) times a day.    Marland Kitchen omeprazole (PRILOSEC) 20 MG capsule Take 20 mg by mouth 2 (two) times daily before a meal.    . ondansetron (ZOFRAN) 4 MG tablet Take 4 mg by mouth every 8 (eight) hours as needed for nausea or vomiting.    Marland Kitchen oxyCODONE-acetaminophen (PERCOCET/ROXICET) 5-325 MG tablet Take 1 tablet by mouth every 6 (six) hours as needed. 12 tablet 0  . primidone (MYSOLINE) 50 MG tablet Take 50 mg by mouth at bedtime.    Marland Kitchen STIOLTO RESPIMAT 2.5-2.5 MCG/ACT AERS Inhale 2 puffs into the lungs at bedtime.     No current facility-administered medications for this visit.      ROS:  See HPI  Physical Exam:  Today's Vitals   02/23/19 1323  BP: (!) 119/57  Pulse: 69  Resp: 18  Temp: 98.2 F (36.8 C)  TempSrc: Temporal  SpO2: 95%  Weight: 198 lb (89.8 kg)  Height: 5\' 11"  (1.803 m)  PainSc: 7    Body mass index is 27.62 kg/m.   Incision:  There is a small area on the distal incision that is not quite healed.  Proximal incision and antecubital incisions have healed nicely.  Extremities:  Easily palpable left radial pulse.  The fistula is easily palpable with an excellent thrill.    Dialysis duplex 02/23/2019: +------------+----------+-------------+----------+----------------+ OUTFLOW VEINPSV (cm/s)Diameter (cm)Depth (cm)         +------------+----------+-------------+----------+----------------+ Prox UA        185        0.79        0.23   competing branch +------------+----------+-------------+----------+----------------+ Mid UA         251        0.78        0.23                    +------------+----------+-------------+----------+----------------+ Dist UA                   0.74        0.78                    +------------+----------+-------------+----------+----------------+ AC Fossa       301        0.86        0.76                     +------------+----------+-------------+----------+----------------+  Assessment/Plan:  This is a 78 y.o. 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 Donzetta Matters  -fistula has matured nicely -he has a small area that has not yet healed on the distal incision.  Will have him return in a couple of weeks to recheck incision.  Will hold off on using fistula until that visit.   -he still has some mild swelling in the LUA.  Advised to elevate his arm to help with the swelling.  -he will call sooner should he have any issues.     Leontine Locket, PA-C [facility] [phone]  Clinic MD:  Trula Slade

## 2019-03-02 ENCOUNTER — Telehealth: Payer: Self-pay

## 2019-03-02 NOTE — Telephone Encounter (Signed)
Daughter called and said that his arm got much more swollen over the weekend and when he was in Dialysis today they told him that he needed to make an appt with [place] to have it rechecked or go to the ER  Appt made for pt to be seen. Daughter asked for a day that Dr Donzetta Matters was in the office so he could look at it if needed given he did the surgery. Advised her if it looked worse before that day to call the office back or take him to the ER for evaluation.    York Cerise, CMA

## 2019-03-06 ENCOUNTER — Ambulatory Visit: Payer: Medicare Other | Admitting: Family

## 2019-03-13 ENCOUNTER — Ambulatory Visit (INDEPENDENT_AMBULATORY_CARE_PROVIDER_SITE_OTHER): Payer: Self-pay | Admitting: Vascular Surgery

## 2019-03-13 ENCOUNTER — Encounter: Payer: Self-pay | Admitting: Vascular Surgery

## 2019-03-13 ENCOUNTER — Other Ambulatory Visit: Payer: Self-pay

## 2019-03-13 VITALS — BP 108/51 | HR 70 | Temp 97.3°F | Resp 20 | Ht 71.0 in | Wt 198.0 lb

## 2019-03-13 DIAGNOSIS — Z992 Dependence on renal dialysis: Secondary | ICD-10-CM

## 2019-03-13 DIAGNOSIS — N186 End stage renal disease: Secondary | ICD-10-CM

## 2019-03-13 NOTE — Progress Notes (Signed)
    Subjective:     Patient ID: Thomas [name], male   DOB: 08-30-1940, 78 y.o.   MRN: [id number]  HPI 78 year old male with end-stage renal disease and history of a single-stage left upper arm basilic vein fistula transposition.  He previous had forearm fistula.  Does have some intermittent swelling of his left upper extremity but not today on exam.  Currently dialyzing via catheter.   Review of Systems Intermittent left upper extremity swelling    Objective:   Physical Exam Vitals:   03/13/19 1509  BP: (!) 108/51  Pulse: 70  Resp: 20  Temp: (!) 97.3 F (36.3 C)  SpO2: 94%  Awake alert oriented Nonlabored respirations Venous collateralization left neck and chest Strong thrill left arm AV fistula Right tunnel dialysis catheter in place     Assessment/plan     78 year old male status post single-stage left upper extremity basilic vein fistula creation.  He is now 6 weeks out from this.  He does have some intermittent swelling in his left upper extremity with some collateralization to suggest potential deep venous stenosis.  If swelling persist we can proceed with fistulogram.  Should be okay to begin to cannulate fistula after this is been used for 1 week catheter can be removed.    If there are issues with swelling or with flow in the fistula we can schedule a fistulogram as an outpatient without him needing further office evaluation.        Flo Berroa C. Donzetta Matters, MD [facility] [place] Office: [phone] Pager: [phone]

## 2019-04-06 ENCOUNTER — Other Ambulatory Visit (HOSPITAL_COMMUNITY): Payer: Self-pay | Admitting: Nephrology

## 2019-04-06 DIAGNOSIS — N186 End stage renal disease: Secondary | ICD-10-CM

## 2019-04-06 DIAGNOSIS — Z992 Dependence on renal dialysis: Secondary | ICD-10-CM

## 2019-04-07 ENCOUNTER — Encounter: Payer: Self-pay | Admitting: *Deleted

## 2019-04-07 ENCOUNTER — Telehealth: Payer: Self-pay | Admitting: *Deleted

## 2019-04-07 ENCOUNTER — Other Ambulatory Visit: Payer: Self-pay | Admitting: *Deleted

## 2019-04-07 NOTE — Progress Notes (Signed)
Schedule for fistulogram per Chanda due to continued issues with HD access per Dr. Claretha Cooper last office visit notes.

## 2019-04-07 NOTE — Progress Notes (Signed)
Called and spoke with patient's daughter Mal Misty. Reviewed all pre-procedure instructions including scheduled time for nasal swab (see Letter). Verbalized understanding.  All instructions faxed to [facility] for patient.

## 2019-04-07 NOTE — Telephone Encounter (Signed)
Faxed received by Caryl Asp at [facility].

## 2019-04-10 ENCOUNTER — Other Ambulatory Visit (HOSPITAL_COMMUNITY)
Admission: RE | Admit: 2019-04-10 | Discharge: 2019-04-10 | Disposition: A | Discharge status: Home / Self Care | Payer: Medicare Other | Source: Ambulatory Visit | Attending: Surgery | Admitting: Surgery

## 2019-04-10 ENCOUNTER — Other Ambulatory Visit: Payer: Self-pay

## 2019-04-10 DIAGNOSIS — Z20828 Contact with and (suspected) exposure to other viral communicable diseases: Secondary | ICD-10-CM | POA: Diagnosis not present

## 2019-04-10 DIAGNOSIS — Z01812 Encounter for preprocedural laboratory examination: Secondary | ICD-10-CM | POA: Insufficient documentation

## 2019-04-10 LAB — SARS CORONAVIRUS 2 (TAT 6-24 HRS): SARS Coronavirus 2: NEGATIVE

## 2019-04-14 ENCOUNTER — Ambulatory Visit (HOSPITAL_COMMUNITY)
Admission: RE | Admit: 2019-04-14 | Discharge: 2019-04-14 | Disposition: A | Discharge status: Home / Self Care | Payer: Medicare Other | Source: Ambulatory Visit | Attending: Nephrology | Admitting: Nephrology

## 2019-04-14 ENCOUNTER — Encounter (HOSPITAL_COMMUNITY)
Admission: RE | Disposition: A | Discharge status: Home / Self Care | Payer: Self-pay | Source: Home / Self Care | Attending: Surgery

## 2019-04-14 ENCOUNTER — Ambulatory Visit (HOSPITAL_COMMUNITY)
Admission: RE | Admit: 2019-04-14 | Discharge: 2019-04-14 | Disposition: A | Discharge status: Home / Self Care | Payer: Medicare Other | Attending: Surgery | Admitting: Surgery

## 2019-04-14 ENCOUNTER — Other Ambulatory Visit: Payer: Self-pay

## 2019-04-14 ENCOUNTER — Encounter (HOSPITAL_COMMUNITY): Payer: Self-pay

## 2019-04-14 DIAGNOSIS — T82510A Breakdown (mechanical) of surgically created arteriovenous fistula, initial encounter: Secondary | ICD-10-CM | POA: Insufficient documentation

## 2019-04-14 DIAGNOSIS — N186 End stage renal disease: Secondary | ICD-10-CM

## 2019-04-14 DIAGNOSIS — Y841 Kidney dialysis as the cause of abnormal reaction of the patient, or of later complication, without mention of misadventure at the time of the procedure: Secondary | ICD-10-CM | POA: Insufficient documentation

## 2019-04-14 DIAGNOSIS — Z992 Dependence on renal dialysis: Secondary | ICD-10-CM | POA: Diagnosis not present

## 2019-04-14 DIAGNOSIS — T82898A Other specified complication of vascular prosthetic devices, implants and grafts, initial encounter: Secondary | ICD-10-CM

## 2019-04-14 HISTORY — PX: A/V FISTULAGRAM: CATH118298

## 2019-04-14 LAB — POCT I-STAT, CHEM 8
BUN: 38 mg/dL — ABNORMAL HIGH (ref 8–23)
Calcium, Ion: 1.07 mmol/L — ABNORMAL LOW (ref 1.15–1.40)
Chloride: 100 mmol/L (ref 98–111)
Creatinine, Ser: 6.9 mg/dL — ABNORMAL HIGH (ref 0.61–1.24)
Glucose, Bld: 206 mg/dL — ABNORMAL HIGH (ref 70–99)
HCT: 34 % — ABNORMAL LOW (ref 39.0–52.0)
Hemoglobin: 11.6 g/dL — ABNORMAL LOW (ref 13.0–17.0)
Potassium: 3.8 mmol/L (ref 3.5–5.1)
Sodium: 138 mmol/L (ref 135–145)
TCO2: 25 mmol/L (ref 22–32)

## 2019-04-14 SURGERY — A/V FISTULAGRAM
Anesthesia: LOCAL

## 2019-04-14 MED ORDER — HEPARIN (PORCINE) IN NACL 1000-0.9 UT/500ML-% IV SOLN
INTRAVENOUS | Status: AC
  Filled 2019-04-14: qty 500

## 2019-04-14 MED ORDER — HEPARIN (PORCINE) IN NACL 1000-0.9 UT/500ML-% IV SOLN
INTRAVENOUS | Status: DC | PRN
  Administered 2019-04-14: 500 mL

## 2019-04-14 MED ORDER — IODIXANOL 320 MG/ML IV SOLN
INTRAVENOUS | Status: DC | PRN
  Administered 2019-04-14: 20 mL

## 2019-04-14 MED ORDER — LIDOCAINE HCL (PF) 1 % IJ SOLN
INTRAMUSCULAR | Status: DC | PRN
  Administered 2019-04-14: 2 mL via INTRADERMAL

## 2019-04-14 MED ORDER — LIDOCAINE HCL (PF) 1 % IJ SOLN
INTRAMUSCULAR | Status: AC
  Filled 2019-04-14: qty 30

## 2019-04-14 MED ORDER — SODIUM CHLORIDE 0.9% FLUSH: 3.0000 mL | INTRAVENOUS | Status: DC | PRN

## 2019-04-14 SURGICAL SUPPLY — 8 items
COVER DOME SNAP 22 D (MISCELLANEOUS) ×2 IMPLANT
KIT MICROPUNCTURE NIT STIFF (SHEATH) ×1 IMPLANT
PROTECTION STATION PRESSURIZED (MISCELLANEOUS) ×2
SHEATH PROBE COVER 6X72 (BAG) ×2 IMPLANT
STATION PROTECTION PRESSURIZED (MISCELLANEOUS) IMPLANT
STOPCOCK MORSE 400PSI 3WAY (MISCELLANEOUS) ×1 IMPLANT
TRAY PV CATH (CUSTOM PROCEDURE TRAY) ×2 IMPLANT
TUBING CIL FLEX 10 FLL-RA (TUBING) ×1 IMPLANT

## 2019-04-14 NOTE — Op Note (Signed)
    Patient name: Thomas [name] MRN: [id number] DOB: 1940/12/28 Sex: male  04/14/2019 Pre-operative Diagnosis: ESRD Post-operative diagnosis:  Same Surgeon:  Annamarie Major Procedure Performed:  1.  U/s guided access, left cephalic vein fistula  2.  fistulogram    Indications:  The patient has numbness with dialysis and is here for fistulogram  Procedure:  The patient was identified in the holding area and taken to room 8.  The patient was then placed supine on the table and prepped and draped in the usual sterile fashion.  A time out was called.  Ultrasound was used to evaluate the fistula.  The vein was patent and compressible.  A digital ultrasound image was acquired.  The fistula was then accessed under ultrasound guidance using a micropuncture needle.  An 018 wire was then asvanced without resistance and a micropuncture sheath was placed.  Contrast injections were then performed through the sheath.  Findings:  No stenosis identified   Intervention:  none  Impression:  #1  Widely patent central venous system  #2  Widely patent fistula  #3  Widely patent a-v anastamosis     V. Annamarie Major, M.D., [facility] [place] Office: [phone] Pager:  [phone]

## 2019-04-14 NOTE — Discharge Instructions (Signed)

## 2019-04-14 NOTE — H&P (Signed)
   Patient name: Thomas [name] MRN: [id number] DOB: January 26, 1941 Sex: male   HISTORY OF PRESENT ILLNESS:   Thomas [name] is a 78 y.o. male with poo function of access  CURRENT MEDICATIONS:    Current Facility-Administered Medications  Medication Dose Route Frequency Provider Last Rate Last Dose  . sodium chloride flush (NS) 0.9 % injection 3 mL  3 mL Intravenous PRN [name], MD        REVIEW OF SYSTEMS:   [X]  denotes positive finding, [ ]  denotes negative finding Cardiac  Comments:  Chest pain or chest pressure:    Shortness of breath upon exertion:    Short of breath when lying flat:    Irregular heart rhythm:    Constitutional    Fever or chills:      PHYSICAL EXAM:   There were no vitals filed for this visit.  GENERAL: The patient is a well-nourished male, in no acute distress. The vital signs are documented above. CARDIOVASCULAR: There is a regular rate and rhythm. PULMONARY: Non-labored respirations   STUDIES:      MEDICAL ISSUES:   Plan for fistulogram with possible intervention  [name], MD, FACS [facility] [phone] Pager [phone]

## 2019-04-15 ENCOUNTER — Encounter (HOSPITAL_COMMUNITY): Payer: Self-pay | Admitting: Surgery

## 2019-04-20 ENCOUNTER — Other Ambulatory Visit (HOSPITAL_COMMUNITY): Payer: Self-pay | Admitting: Nephrology

## 2019-04-20 DIAGNOSIS — N186 End stage renal disease: Secondary | ICD-10-CM

## 2019-04-23 ENCOUNTER — Other Ambulatory Visit: Payer: Self-pay

## 2019-04-23 ENCOUNTER — Encounter (HOSPITAL_COMMUNITY): Payer: Self-pay | Admitting: Physician Assistant

## 2019-04-23 ENCOUNTER — Ambulatory Visit (HOSPITAL_COMMUNITY)
Admission: RE | Admit: 2019-04-23 | Discharge: 2019-04-23 | Disposition: A | Discharge status: Home / Self Care | Payer: Medicare Other | Source: Ambulatory Visit | Attending: Nephrology | Admitting: Nephrology

## 2019-04-23 DIAGNOSIS — N186 End stage renal disease: Secondary | ICD-10-CM | POA: Insufficient documentation

## 2019-04-23 DIAGNOSIS — Z4901 Encounter for fitting and adjustment of extracorporeal dialysis catheter: Secondary | ICD-10-CM | POA: Insufficient documentation

## 2019-04-23 HISTORY — PX: IR REMOVAL TUN CV CATH W/O FL: IMG2289

## 2019-04-23 MED ORDER — LIDOCAINE HCL (PF) 1 % IJ SOLN
INTRAMUSCULAR | Status: DC | PRN
  Administered 2019-04-23: 10 mL

## 2019-04-23 MED ORDER — LIDOCAINE HCL 1 % IJ SOLN
INTRAMUSCULAR | Status: AC
  Filled 2019-04-23: qty 20

## 2019-04-23 MED ORDER — CHLORHEXIDINE GLUCONATE 4 % EX LIQD
CUTANEOUS | Status: AC
  Filled 2019-04-23: qty 15

## 2019-04-23 NOTE — Procedures (Signed)
Pre procedural Dx: ESRD Post procedural Dx: Same  Successful removal of tunneled right HD catheter  EBL: None No immediate complications.  Hemostasis achieved with manual pressure. 4x4 gauze + tegaderm applied. Dressing to remain x 24H and then may remove and shower.   Please see imaging section of Epic for full dictation.  Joaquim Nam PA-C 04/23/2019 10:00 AM

## 2019-06-07 ENCOUNTER — Inpatient Hospital Stay (HOSPITAL_COMMUNITY)
Admission: EM | Admit: 2019-06-07 | Discharge: 2019-06-17 | DRG: 871 | Disposition: A | Discharge status: Home / Self Care | Payer: Medicare Other | Attending: Internal Medicine | Admitting: Internal Medicine

## 2019-06-07 ENCOUNTER — Other Ambulatory Visit: Payer: Self-pay

## 2019-06-07 ENCOUNTER — Emergency Department (HOSPITAL_COMMUNITY): Payer: Medicare Other

## 2019-06-07 ENCOUNTER — Encounter (HOSPITAL_COMMUNITY): Payer: Self-pay

## 2019-06-07 DIAGNOSIS — Z955 Presence of coronary angioplasty implant and graft: Secondary | ICD-10-CM

## 2019-06-07 DIAGNOSIS — I10 Essential (primary) hypertension: Secondary | ICD-10-CM | POA: Diagnosis present

## 2019-06-07 DIAGNOSIS — N186 End stage renal disease: Secondary | ICD-10-CM

## 2019-06-07 DIAGNOSIS — A4189 Other specified sepsis: Principal | ICD-10-CM | POA: Diagnosis present

## 2019-06-07 DIAGNOSIS — U071 COVID-19: Secondary | ICD-10-CM | POA: Diagnosis present

## 2019-06-07 DIAGNOSIS — D631 Anemia in chronic kidney disease: Secondary | ICD-10-CM | POA: Diagnosis present

## 2019-06-07 DIAGNOSIS — R197 Diarrhea, unspecified: Secondary | ICD-10-CM | POA: Diagnosis not present

## 2019-06-07 DIAGNOSIS — Z9981 Dependence on supplemental oxygen: Secondary | ICD-10-CM

## 2019-06-07 DIAGNOSIS — Z87891 Personal history of nicotine dependence: Secondary | ICD-10-CM

## 2019-06-07 DIAGNOSIS — J440 Chronic obstructive pulmonary disease with acute lower respiratory infection: Secondary | ICD-10-CM | POA: Diagnosis present

## 2019-06-07 DIAGNOSIS — E1140 Type 2 diabetes mellitus with diabetic neuropathy, unspecified: Secondary | ICD-10-CM | POA: Diagnosis present

## 2019-06-07 DIAGNOSIS — E1136 Type 2 diabetes mellitus with diabetic cataract: Secondary | ICD-10-CM | POA: Diagnosis present

## 2019-06-07 DIAGNOSIS — J9621 Acute and chronic respiratory failure with hypoxia: Secondary | ICD-10-CM | POA: Diagnosis present

## 2019-06-07 DIAGNOSIS — G253 Myoclonus: Secondary | ICD-10-CM | POA: Diagnosis present

## 2019-06-07 DIAGNOSIS — E1122 Type 2 diabetes mellitus with diabetic chronic kidney disease: Secondary | ICD-10-CM | POA: Diagnosis present

## 2019-06-07 DIAGNOSIS — R0902 Hypoxemia: Secondary | ICD-10-CM

## 2019-06-07 DIAGNOSIS — J449 Chronic obstructive pulmonary disease, unspecified: Secondary | ICD-10-CM | POA: Diagnosis present

## 2019-06-07 DIAGNOSIS — Z79891 Long term (current) use of opiate analgesic: Secondary | ICD-10-CM

## 2019-06-07 DIAGNOSIS — E8889 Other specified metabolic disorders: Secondary | ICD-10-CM | POA: Diagnosis present

## 2019-06-07 DIAGNOSIS — Z809 Family history of malignant neoplasm, unspecified: Secondary | ICD-10-CM

## 2019-06-07 DIAGNOSIS — J1282 Pneumonia due to coronavirus disease 2019: Secondary | ICD-10-CM | POA: Diagnosis present

## 2019-06-07 DIAGNOSIS — E11649 Type 2 diabetes mellitus with hypoglycemia without coma: Secondary | ICD-10-CM | POA: Diagnosis present

## 2019-06-07 DIAGNOSIS — Z992 Dependence on renal dialysis: Secondary | ICD-10-CM

## 2019-06-07 DIAGNOSIS — Z8674 Personal history of sudden cardiac arrest: Secondary | ICD-10-CM

## 2019-06-07 DIAGNOSIS — K222 Esophageal obstruction: Secondary | ICD-10-CM | POA: Diagnosis present

## 2019-06-07 DIAGNOSIS — N2581 Secondary hyperparathyroidism of renal origin: Secondary | ICD-10-CM | POA: Diagnosis present

## 2019-06-07 DIAGNOSIS — A419 Sepsis, unspecified organism: Secondary | ICD-10-CM | POA: Diagnosis present

## 2019-06-07 DIAGNOSIS — G4730 Sleep apnea, unspecified: Secondary | ICD-10-CM | POA: Diagnosis present

## 2019-06-07 DIAGNOSIS — I132 Hypertensive heart and chronic kidney disease with heart failure and with stage 5 chronic kidney disease, or end stage renal disease: Secondary | ICD-10-CM | POA: Diagnosis present

## 2019-06-07 DIAGNOSIS — K219 Gastro-esophageal reflux disease without esophagitis: Secondary | ICD-10-CM | POA: Diagnosis present

## 2019-06-07 DIAGNOSIS — I252 Old myocardial infarction: Secondary | ICD-10-CM

## 2019-06-07 DIAGNOSIS — Z794 Long term (current) use of insulin: Secondary | ICD-10-CM

## 2019-06-07 DIAGNOSIS — J9611 Chronic respiratory failure with hypoxia: Secondary | ICD-10-CM | POA: Diagnosis present

## 2019-06-07 DIAGNOSIS — D696 Thrombocytopenia, unspecified: Secondary | ICD-10-CM | POA: Diagnosis present

## 2019-06-07 DIAGNOSIS — I509 Heart failure, unspecified: Secondary | ICD-10-CM | POA: Diagnosis present

## 2019-06-07 DIAGNOSIS — E875 Hyperkalemia: Secondary | ICD-10-CM | POA: Diagnosis not present

## 2019-06-07 DIAGNOSIS — Z79899 Other long term (current) drug therapy: Secondary | ICD-10-CM

## 2019-06-07 DIAGNOSIS — J189 Pneumonia, unspecified organism: Secondary | ICD-10-CM

## 2019-06-07 DIAGNOSIS — I2510 Atherosclerotic heart disease of native coronary artery without angina pectoris: Secondary | ICD-10-CM | POA: Diagnosis present

## 2019-06-07 DIAGNOSIS — E119 Type 2 diabetes mellitus without complications: Secondary | ICD-10-CM

## 2019-06-07 LAB — CBC WITH DIFFERENTIAL/PLATELET
Abs Immature Granulocytes: 0.04 10*3/uL (ref 0.00–0.07)
Basophils Absolute: 0 10*3/uL (ref 0.0–0.1)
Basophils Relative: 0 %
Eosinophils Absolute: 0 10*3/uL (ref 0.0–0.5)
Eosinophils Relative: 0 %
HCT: 49.8 % (ref 39.0–52.0)
Hemoglobin: 14.3 g/dL (ref 13.0–17.0)
Immature Granulocytes: 1 %
Lymphocytes Relative: 20 %
Lymphs Abs: 1.1 10*3/uL (ref 0.7–4.0)
MCH: 27.2 pg (ref 26.0–34.0)
MCHC: 28.7 g/dL — ABNORMAL LOW (ref 30.0–36.0)
MCV: 94.7 fL (ref 80.0–100.0)
Monocytes Absolute: 0.3 10*3/uL (ref 0.1–1.0)
Monocytes Relative: 6 %
Neutro Abs: 3.9 10*3/uL (ref 1.7–7.7)
Neutrophils Relative %: 73 %
Platelets: 117 10*3/uL — ABNORMAL LOW (ref 150–400)
RBC: 5.26 MIL/uL (ref 4.22–5.81)
RDW: 17 % — ABNORMAL HIGH (ref 11.5–15.5)
WBC: 5.4 10*3/uL (ref 4.0–10.5)
nRBC: 0 % (ref 0.0–0.2)

## 2019-06-07 LAB — D-DIMER, QUANTITATIVE: D-Dimer, Quant: 1.87 ug/mL-FEU — ABNORMAL HIGH (ref 0.00–0.50)

## 2019-06-07 LAB — LACTIC ACID, PLASMA: Lactic Acid, Venous: 1.7 mmol/L (ref 0.5–1.9)

## 2019-06-07 LAB — FIBRINOGEN: Fibrinogen: 115 mg/dL — ABNORMAL LOW (ref 210–475)

## 2019-06-07 NOTE — ED Triage Notes (Signed)
Pt bib [facility] from home w/ c/o hypotension and weakness. Dialysis pt, received partial dialysis on Friday but unknown reason for why he did not get full treatment. Pt has been getting increasingly more weak over the last two days, w/ reports of AMS. Pt AOx4 on arrival to ED. EMS BP 104/78. Pt wears 2 lpm o2 at baseline. Pt had recent negative covid test per EMS, wife recently diagnosed with e-coli.

## 2019-06-07 NOTE — ED Provider Notes (Signed)
[facility] EMERGENCY DEPARTMENT Provider Note   CSN: [id number] Arrival date & time: 06/07/19  2108     History Chief Complaint  Patient presents with  . Hypotension    Thomas [name] is a 79 y.o. male.  HPI Patient reports both he and his wife have been sick at home.  He has had diarrheal illness.  Patient denies focal pain.  He has been increasingly short of breath.  No vomiting.  No known Covid contacts.    Past Medical History:  Diagnosis Date  . Anemia   . Arthritis    hands, back  . Cancer (HCC)    nose  . Cardiac arrest ([place])   . Cataract   . CHF (congestive heart failure) ([place])   . COPD (chronic obstructive pulmonary disease) ([place])   . Coronary artery disease   . Diabetes mellitus ([place])    Type II  . Diverticulosis 2013   tcs by RMR  . Diverticulosis   . Erosive esophagitis   . ESRD on hemodialysis (HCC)    CKD, hemodialysis Tues/Thurs/Sat  . Family history of adverse reaction to anesthesia    Daughter slow to awaken and extreme nausea  . GERD (gastroesophageal reflux disease)   . Headache   . Hiatal hernia    pt not aware of this  . HTN (hypertension)   . Hypercholesterolemia   . Hypothyroidism   . Irritable bowel syndrome (IBS)   . Jerking    hands or feet- 01/2019- has decreased  . Lymphocytic colitis 2013   tcs by RMR  . Myocardial infarction ([place])    X2  . Nasal mass   . Neuropathy   . Pneumonia   . Schatzki's ring   . SDH (subdural hematoma) ([place])    02/2018, conservative management  . Shortness of breath   . Sleep apnea    uses O2 only  . Supplemental oxygen dependent    At HS and PRN during the day  . Tobacco abuse   . Wears dentures   . Wears glasses     Patient Active Problem List   Diagnosis Date Noted  . Volume overload 01/26/2019  . Pneumonia 01/25/2019  . Generalized weakness 01/24/2019  . Congestive heart failure ([place]) 05/11/2016  . Venous stenosis of left upper extremity 05/11/2016  . End stage  renal disease ([place]) 06/19/2013  . Rectal bleeding 11/27/2012  . Upper abdominal pain 07/07/2012  . Anemia 07/07/2012  . Chronic nausea 07/07/2012  . Microscopic colitis 03/07/2012  . DIVERTICULITIS, HX OF 02/01/2010  . CONSTIPATION 09/10/2008  . ABDOMINAL PAIN, LOWER 09/10/2008  . DIABETES MELLITUS, TYPE II 09/09/2008  . CIGARETTE SMOKER 09/09/2008  . CORONARY ARTERY DISEASE 09/09/2008  . 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 Esophageal reflux 09/09/2008  . HIATAL HERNIA 09/09/2008  . HOARSENESS 09/09/2008    Past Surgical History:  Procedure Laterality Date  . A/V FISTULAGRAM N/A 04/14/2019   Procedure: A/V FISTULAGRAM - Left Arm;  Surgeon: [name], MD;  Location: [place] [facility];  Service: Cardiovascular;  Laterality: N/A;  . AV FISTULA PLACEMENT Left 06/29/2013   Procedure: LEFT RADIAL/CEPHALIC FISTULA;  Surgeon: Conrad Boonville, MD;  Location: [place];  Service: Vascular;  Laterality: Left;  . BASCILIC VEIN TRANSPOSITION Left 01/21/2019   Procedure: left arm Bascilic Vein Transposition;  Surgeon: Waynetta Sandy, MD;  Location: [place];  Service: Vascular;  Laterality: Left;  . cardiac stents     5  . CATARACT EXTRACTION  W/PHACO Left 05/17/2014   Procedure: CATARACT EXTRACTION PHACO AND INTRAOCULAR LENS PLACEMENT (IOC);  Surgeon: Tonny Branch, MD;  Location: [facility];  Service: Ophthalmology;  Laterality: Left;  CDE:7.71  . CATARACT EXTRACTION W/PHACO Right 06/14/2014   Procedure: CATARACT EXTRACTION PHACO AND INTRAOCULAR LENS PLACEMENT RIGHT EYE;  Surgeon: Tonny Branch, MD;  Location: [facility];  Service: Ophthalmology;  Laterality: Right;  CDE:8.22  . CHOLECYSTECTOMY    . COLONOSCOPY  December 2007   adenomatous polyps, sigmoid diverticula  . COLONOSCOPY  02/06/2012   Dr. Gala Romney- colonic diverticulosis, bx consistent with lymphocytic colitis  . CORONARY ANGIOPLASTY     x5 stents  . ESOPHAGOGASTRODUODENOSCOPY  May 2009   RMR: non-critical Schatzki's ring, sp dilation with 84 F  Maloney, small inlet patch, mild erosive relufx esophagitis  . ESOPHAGOGASTRODUODENOSCOPY N/A 07/15/2012   Dr. Gala Romney- hiatal hernia, duodenal ulcer with surrounding erosions. this may well be related to the "haziness" sen around the head of the pancreas and duodenum on recent noncontrast ct scan and may have much to do with his abdominal pain.  Marland Kitchen EXCHANGE OF A DIALYSIS CATHETER Right 01/21/2019   Procedure: EXCHANGE OF A DIALYSIS CATHETER, right internal jugular;  Surgeon: Waynetta Sandy, MD;  Location: [place];  Service: Vascular;  Laterality: Right;  . EXCISION NASAL MASS N/A 07/18/2015   Procedure: EXCISION OF NASAL MASS;  Surgeon: Leta Baptist, MD;  Location: [place];  Service: ENT;  Laterality: N/A;  . INSERTION OF DIALYSIS CATHETER Right 05/21/2013   Procedure: INSERTION OF DIALYSIS CATHETER;  Surgeon: Conrad Socorro, MD;  Location: [place];  Service: Vascular;  Laterality: Right;  . IR DIALY SHUNT INTRO [place] W/IMG LEFT Left 10/29/2018  . IR FLUORO GUIDE CV LINE RIGHT  11/05/2018  . IR FLUORO GUIDE CV LINE RIGHT  02/04/2019  . IR REMOVAL TUN CV CATH W/O FL  04/23/2019  . IR US GUIDE VASC ACCESS LEFT  10/29/2018  . IR US GUIDE VASC ACCESS RIGHT  11/05/2018  . LIGATION OF COMPETING BRANCHES OF ARTERIOVENOUS FISTULA Left 11/27/2018   Procedure: Ligation Of Competing Branches Of Arteriovenous Fistula;  Surgeon: [name], MD;  Location: [facility];  Service: Vascular;  Laterality: Left;  Marland Kitchen MULTIPLE TOOTH EXTRACTIONS    . REVISION OF ARTERIOVENOUS GORETEX GRAFT Left 11/27/2018   Procedure: Revision of Left Arm Arteriovenious Fistula;  Surgeon: [name], MD;  Location: [facility];  Service: Vascular;  Laterality: Left;  . THROMBECTOMY W/ EMBOLECTOMY Left 11/27/2018   Procedure: Thrombectomy Arteriovenous Fistula;  Surgeon: [name], MD;  Location: [facility] OR;  Service: Vascular;  Laterality: Left;  Marland Kitchen VASECTOMY         Family History  Problem Relation Age of Onset  . Cancer  Mother   . Cancer Father   . Diabetes Brother   . Cancer Brother   . Diabetes Sister   . Cancer Sister   . Cancer Sister   . Colon cancer Neg Hx     Social History   Tobacco Use  . Smoking status: Former Smoker    Packs/day: 0.00    Years: 60.00    Pack years: 0.00    Types: E-cigarettes, Cigarettes    Quit date: 11/10/2014    Years since quitting: 4.5  . Smokeless tobacco: Never Used  Substance Use Topics  . Alcohol use: No  . Drug use: No    Home Medications Prior to Admission medications   Medication Sig Start Date End Date Taking?  Authorizing Provider  albuterol (PROVENTIL) (2.5 MG/3ML) 0.083% nebulizer solution Take 2.5 mg by nebulization every 6 (six) hours as needed for wheezing or shortness of breath.    [provider]  albuterol (PROVENTIL,VENTOLIN) 2 MG/5ML syrup Take 2 mg by mouth See admin instructions. Use 2 mg by mouth scheduled twice daily, may take an additional dose if needed for respiratory issues.    [provider]  albuterol (VENTOLIN HFA) 108 (90 Base) MCG/ACT inhaler Inhale 1-2 puffs into the lungs every 4 (four) hours as needed for wheezing or shortness of breath.    [provider]  ALPRAZolam Duanne Moron) 1 MG tablet Take 1 mg by mouth at bedtime as needed for sleep.  07/30/18   [provider]  amLODipine (NORVASC) 10 MG tablet Take 10 mg by mouth daily.     [provider]  b complex-vitamin c-folic acid (NEPHRO-VITE) 0.8 MG TABS tablet Take 1 tablet by mouth at bedtime.     [provider]  butalbital-acetaminophen-caffeine (FIORICET, ESGIC) 50-325-40 MG per tablet Take 1 tablet by mouth every 6 (six) hours as needed for headache.     [provider]  calcium acetate (PHOSLO) 667 MG capsule Take 667-1,334 mg by mouth See admin instructions. Take 2 capsules (1334 mg) by mouth with each meal & take 1 capsule (667 mg) by mouth with each snack.    [provider]  Calcium  Carb-Cholecalciferol (CALCIUM + D3 PO) Take 1 tablet by mouth daily.     [provider]  Cholecalciferol (VITAMIN D3) 50 MCG (2000 UT) capsule Take 4,000 Units by mouth 2 (two) times daily.     [provider]  dicyclomine (BENTYL) 20 MG tablet Take 20 mg by mouth See admin instructions. Take 1 tablet (20 mg) by mouth scheduled at night, may take up to 3 additional doses as needed for abdominal issues .    [provider]  diphenhydrAMINE (BENADRYL) 25 MG tablet Take 25 mg by mouth every Monday, Wednesday, and Friday.    [provider]  diphenhydrAMINE-zinc acetate (BENADRYL) cream Apply topically 2 (two) times daily as needed for itching. 01/30/19   Jeanmarie Hubert, MD  gabapentin (NEURONTIN) 300 MG capsule Take 300 mg by mouth See admin instructions. Take 1 capsule (300 mg) by mouth scheduled every morning, may take an additional capsule if needed for pain.    [provider]  hydrochlorothiazide (HYDRODIURIL) 12.5 MG tablet Take 12.5 mg by mouth daily.    [provider]  hydrocortisone cream 1 % Apply 1 application topically 2 (two) times daily as needed for itching.    [provider]  insulin lispro (HUMALOG) 100 UNIT/ML injection Inject 10 Units into the skin 3 (three) times daily with meals.     [provider]  isosorbide mononitrate (IMDUR) 30 MG 24 hr tablet Take 30 mg by mouth daily.  03/03/12   [provider]  LANTUS 100 UNIT/ML injection Inject 30 Units into the skin at bedtime. 01/02/19   [provider]  lidocaine-prilocaine (EMLA) cream Apply 1 application topically as needed (port access).    [provider]  loperamide (IMODIUM) 1 MG/5ML solution Take 1-2 mg by mouth as needed for diarrhea or loose stools.    [provider]  losartan (COZAAR) 100 MG tablet Take 100 mg by mouth daily.  02/19/18   [provider]  magnesium oxide (MAG-OX) 400 MG tablet Take 400 mg by  mouth at bedtime.  [provider]  metoprolol (LOPRESSOR) 100 MG tablet Take 100 mg by mouth 2 (two) times daily.  12/07/11   [provider]  Naphazoline-Glycerin (REDNESS RELIEF OP) Place 1 drop into both eyes daily as needed (redness/ dryness).    [provider]  NITROSTAT 0.4 MG SL tablet Place 0.4 mg under the tongue every 5 (five) minutes as needed for chest pain.  06/01/13   [provider]  Omega-3 Fatty Acids (FISH OIL) 1000 MG CAPS Take 2,000 mg by mouth 2 (two) times a day.    [provider]  omeprazole (PRILOSEC) 20 MG capsule Take 20 mg by mouth 2 (two) times daily.     [provider]  ondansetron (ZOFRAN) 4 MG tablet Take 4 mg by mouth every 6 (six) hours as needed for nausea or vomiting.     [provider]  oxyCODONE-acetaminophen (PERCOCET/ROXICET) 5-325 MG tablet Take 1 tablet by mouth every 6 (six) hours as needed. Patient not taking: Reported on 04/10/2019 01/21/19   Ulyses Amor, PA-C  primidone (MYSOLINE) 50 MG tablet Take 50 mg by mouth at bedtime. 06/17/18   [provider]  STIOLTO RESPIMAT 2.5-2.5 MCG/ACT AERS Inhale 2 puffs into the lungs at bedtime. 09/24/18   [provider]  vitamin B-12 (CYANOCOBALAMIN) 1000 MCG tablet Take 1,000 mcg by mouth every evening.    [provider]    Allergies    Ace inhibitors, Statins, Codeine, and Tape  Review of Systems   Review of Systems 10 Systems reviewed and are negative for acute change except as noted in the HPI. Physical Exam Updated Vital Signs BP (!) 109/43   Pulse 68   Temp (!) 101 F (38.3 C) (Rectal)   Resp (!) 25   SpO2 97%   Physical Exam Constitutional:      Appearance: He is well-developed.     Comments: Patient is alert but moderately ill in appearance.  He does have rigors.  His mental status is clear.  No respiratory distress at rest.  HENT:     Head: Normocephalic and atraumatic.     Nose:     Comments:  Patient's nose is completely eroded but all surrounding surface is healed.  This is the sequelae of nasal cancer.  Oral cavity is dry. Eyes:     Pupils: Pupils are equal, round, and reactive to light.  Cardiovascular:     Rate and Rhythm: Normal rate and regular rhythm.     Heart sounds: Normal heart sounds.  Pulmonary:     Effort: Pulmonary effort is normal.     Breath sounds: Normal breath sounds.  Abdominal:     General: Bowel sounds are normal. There is no distension.     Palpations: Abdomen is soft.     Tenderness: There is no abdominal tenderness.  Musculoskeletal:        General: Normal range of motion.     Cervical back: Neck supple.  Skin:    General: Skin is warm and dry.  Neurological:     Mental Status: He is alert and oriented to person, place, and time.     GCS: GCS eye subscore is 4. GCS verbal subscore is 5. GCS motor subscore is 6.     Coordination: Coordination normal.  Psychiatric:        Mood and Affect: Mood normal.     ED Results / Procedures / Treatments   Labs (all labs ordered are listed, but only abnormal results are  displayed) Labs Reviewed  CBC WITH DIFFERENTIAL/PLATELET - Abnormal; Notable for the following components:      Result Value   MCHC 28.7 (*)    RDW 17.0 (*)    Platelets 117 (*)    All other components within normal limits  CULTURE, BLOOD (ROUTINE X 2)  SARS CORONAVIRUS 2 (TAT 6-24 HRS)  CULTURE, BLOOD (ROUTINE X 2)  LACTIC ACID, PLASMA  LACTIC ACID, PLASMA  D-DIMER, QUANTITATIVE (NOT AT [facility])  TRIGLYCERIDES  FIBRINOGEN  COMPREHENSIVE METABOLIC PANEL  LACTATE DEHYDROGENASE  PROCALCITONIN  C-REACTIVE PROTEIN  FERRITIN    EKG EKG Interpretation  Date/Time:  Sunday June 07 2019 21:24:00 EST Ventricular Rate:  70 PR Interval:    QRS Duration: 89 QT Interval:  410 QTC Calculation: 443 R Axis:   45 Text Interpretation: Sinus rhythm Inferior infarct, old no sig change Confirmed by Charlesetta Shanks [phone]) on 06/07/2019  9:26:58 PM   Radiology DG Chest Port 1 View  Result Date: 06/07/2019 CLINICAL DATA:  Weakness.  Hypotension.  Dialysis patient. EXAM: PORTABLE CHEST 1 VIEW COMPARISON:  04/23/2019 FINDINGS: Previously seen right internal jugular catheter has been removed. Mild cardiomegaly as seen previously. Abnormal interstitial pulmonary density probably indicating fluid overload/congestive heart failure. This is asymmetrically more pronounced on the left, and left lung pneumonia is not excluded. IMPRESSION: Probable fluid overload/interstitial edema. Findings are asymmetrically more pronounced on the left, raising the possibility pneumonia. Electronically Signed   By: Nelson Chimes M.D.   On: 06/07/2019 21:53    Procedures Procedures (including critical care time)  Medications Ordered in ED Medications - No data to display  ED Course  I have reviewed the triage vital signs and the nursing notes.  Pertinent labs & imaging results that were available during my care of the patient were reviewed by me and considered in my medical decision making (see chart for details).    MDM Rules/Calculators/A&P                     Patient presents with fever, tachypnea and rigors.  High suspicion for infectious etiology.  Patient's wife is sick at home as well.  For suspicions for COVID-19.  Will obtain labs with inflammatory markers.  Chest x-ray shows vascular congestion.  Patient will require admission. Final Clinical Impression(s) / ED Diagnoses Final diagnoses:  None    Rx / DC Orders ED Discharge Orders    None       Charlesetta Shanks, MD 06/07/19 2358

## 2019-06-08 ENCOUNTER — Encounter (HOSPITAL_COMMUNITY): Payer: Self-pay | Admitting: Family Medicine

## 2019-06-08 DIAGNOSIS — E1136 Type 2 diabetes mellitus with diabetic cataract: Secondary | ICD-10-CM | POA: Diagnosis present

## 2019-06-08 DIAGNOSIS — I2510 Atherosclerotic heart disease of native coronary artery without angina pectoris: Secondary | ICD-10-CM | POA: Diagnosis present

## 2019-06-08 DIAGNOSIS — I252 Old myocardial infarction: Secondary | ICD-10-CM | POA: Diagnosis not present

## 2019-06-08 DIAGNOSIS — E1122 Type 2 diabetes mellitus with diabetic chronic kidney disease: Secondary | ICD-10-CM | POA: Diagnosis present

## 2019-06-08 DIAGNOSIS — K222 Esophageal obstruction: Secondary | ICD-10-CM | POA: Diagnosis present

## 2019-06-08 DIAGNOSIS — D696 Thrombocytopenia, unspecified: Secondary | ICD-10-CM | POA: Diagnosis present

## 2019-06-08 DIAGNOSIS — J9621 Acute and chronic respiratory failure with hypoxia: Secondary | ICD-10-CM | POA: Diagnosis present

## 2019-06-08 DIAGNOSIS — E119 Type 2 diabetes mellitus without complications: Secondary | ICD-10-CM | POA: Diagnosis not present

## 2019-06-08 DIAGNOSIS — J1282 Pneumonia due to coronavirus disease 2019: Secondary | ICD-10-CM | POA: Diagnosis present

## 2019-06-08 DIAGNOSIS — E11649 Type 2 diabetes mellitus with hypoglycemia without coma: Secondary | ICD-10-CM | POA: Diagnosis present

## 2019-06-08 DIAGNOSIS — J9611 Chronic respiratory failure with hypoxia: Secondary | ICD-10-CM | POA: Diagnosis not present

## 2019-06-08 DIAGNOSIS — J449 Chronic obstructive pulmonary disease, unspecified: Secondary | ICD-10-CM | POA: Diagnosis present

## 2019-06-08 DIAGNOSIS — I509 Heart failure, unspecified: Secondary | ICD-10-CM | POA: Diagnosis present

## 2019-06-08 DIAGNOSIS — I10 Essential (primary) hypertension: Secondary | ICD-10-CM

## 2019-06-08 DIAGNOSIS — J189 Pneumonia, unspecified organism: Secondary | ICD-10-CM | POA: Diagnosis not present

## 2019-06-08 DIAGNOSIS — N186 End stage renal disease: Secondary | ICD-10-CM

## 2019-06-08 DIAGNOSIS — I132 Hypertensive heart and chronic kidney disease with heart failure and with stage 5 chronic kidney disease, or end stage renal disease: Secondary | ICD-10-CM | POA: Diagnosis present

## 2019-06-08 DIAGNOSIS — D631 Anemia in chronic kidney disease: Secondary | ICD-10-CM | POA: Diagnosis present

## 2019-06-08 DIAGNOSIS — N2581 Secondary hyperparathyroidism of renal origin: Secondary | ICD-10-CM | POA: Diagnosis present

## 2019-06-08 DIAGNOSIS — A419 Sepsis, unspecified organism: Secondary | ICD-10-CM | POA: Diagnosis present

## 2019-06-08 DIAGNOSIS — U071 COVID-19: Secondary | ICD-10-CM | POA: Diagnosis present

## 2019-06-08 DIAGNOSIS — K219 Gastro-esophageal reflux disease without esophagitis: Secondary | ICD-10-CM | POA: Diagnosis present

## 2019-06-08 DIAGNOSIS — R197 Diarrhea, unspecified: Secondary | ICD-10-CM | POA: Diagnosis present

## 2019-06-08 DIAGNOSIS — Z87891 Personal history of nicotine dependence: Secondary | ICD-10-CM | POA: Diagnosis not present

## 2019-06-08 DIAGNOSIS — G253 Myoclonus: Secondary | ICD-10-CM | POA: Diagnosis present

## 2019-06-08 DIAGNOSIS — E8889 Other specified metabolic disorders: Secondary | ICD-10-CM | POA: Diagnosis present

## 2019-06-08 DIAGNOSIS — E1140 Type 2 diabetes mellitus with diabetic neuropathy, unspecified: Secondary | ICD-10-CM | POA: Diagnosis present

## 2019-06-08 DIAGNOSIS — A4189 Other specified sepsis: Secondary | ICD-10-CM | POA: Diagnosis present

## 2019-06-08 DIAGNOSIS — E875 Hyperkalemia: Secondary | ICD-10-CM | POA: Diagnosis not present

## 2019-06-08 DIAGNOSIS — J440 Chronic obstructive pulmonary disease with acute lower respiratory infection: Secondary | ICD-10-CM | POA: Diagnosis present

## 2019-06-08 DIAGNOSIS — G4730 Sleep apnea, unspecified: Secondary | ICD-10-CM | POA: Diagnosis present

## 2019-06-08 LAB — COMPREHENSIVE METABOLIC PANEL
ALT: 44 U/L (ref 0–44)
AST: 44 U/L — ABNORMAL HIGH (ref 15–41)
Albumin: 2.8 g/dL — ABNORMAL LOW (ref 3.5–5.0)
Alkaline Phosphatase: 102 U/L (ref 38–126)
Anion gap: 24 — ABNORMAL HIGH (ref 5–15)
BUN: 88 mg/dL — ABNORMAL HIGH (ref 8–23)
CO2: 16 mmol/L — ABNORMAL LOW (ref 22–32)
Calcium: 7.1 mg/dL — ABNORMAL LOW (ref 8.9–10.3)
Chloride: 94 mmol/L — ABNORMAL LOW (ref 98–111)
Creatinine, Ser: 13.68 mg/dL — ABNORMAL HIGH (ref 0.61–1.24)
GFR calc Af Amer: 4 mL/min — ABNORMAL LOW (ref >60)
GFR calc non Af Amer: 3 mL/min — ABNORMAL LOW (ref >60)
Glucose, Bld: 179 mg/dL — ABNORMAL HIGH (ref 70–99)
Potassium: 4.8 mmol/L (ref 3.5–5.1)
Sodium: 134 mmol/L — ABNORMAL LOW (ref 135–145)
Total Bilirubin: 0.4 mg/dL (ref 0.3–1.2)
Total Protein: 6.9 g/dL (ref 6.5–8.1)

## 2019-06-08 LAB — CBG MONITORING, ED
Glucose-Capillary: 129 mg/dL — ABNORMAL HIGH (ref 70–99)
Glucose-Capillary: 199 mg/dL — ABNORMAL HIGH (ref 70–99)
Glucose-Capillary: 144 mg/dL — ABNORMAL HIGH (ref 70–99)
Glucose-Capillary: 129 mg/dL — ABNORMAL HIGH (ref 70–99)

## 2019-06-08 LAB — C-REACTIVE PROTEIN: CRP: 18.2 mg/dL — ABNORMAL HIGH (ref <1.0)

## 2019-06-08 LAB — SARS CORONAVIRUS 2 (TAT 6-24 HRS): SARS Coronavirus 2: POSITIVE — AB

## 2019-06-08 LAB — TRIGLYCERIDES: Triglycerides: 560 mg/dL — ABNORMAL HIGH (ref <150)

## 2019-06-08 LAB — PROCALCITONIN: Procalcitonin: 2.85 ng/mL

## 2019-06-08 LAB — POC SARS CORONAVIRUS 2 AG -  ED: SARS Coronavirus 2 Ag: POSITIVE — AB

## 2019-06-08 LAB — LACTATE DEHYDROGENASE: LDH: 245 U/L — ABNORMAL HIGH (ref 98–192)

## 2019-06-08 LAB — FERRITIN: Ferritin: 1318 ng/mL — ABNORMAL HIGH (ref 24–336)

## 2019-06-08 MED ORDER — INSULIN GLARGINE 100 UNIT/ML ~~LOC~~ SOLN
8.0000 [IU] | Freq: Two times a day (BID) | SUBCUTANEOUS | Status: DC
  Administered 2019-06-08 – 2019-06-15 (×14): 8 [IU] via SUBCUTANEOUS
  Filled 2019-06-08 (×17): qty 0.08

## 2019-06-08 MED ORDER — SODIUM CHLORIDE 0.9 % IV SOLN
100.0000 mg | Freq: Every day | INTRAVENOUS | Status: AC
  Administered 2019-06-09 – 2019-06-12 (×4): 100 mg via INTRAVENOUS
  Filled 2019-06-08 (×4): qty 20

## 2019-06-08 MED ORDER — SODIUM CHLORIDE 0.9 % IV SOLN: 100.0000 mg | Freq: Every day | INTRAVENOUS | Status: DC

## 2019-06-08 MED ORDER — MAGNESIUM OXIDE 400 (241.3 MG) MG PO TABS
400.0000 mg | ORAL_TABLET | Freq: Every day | ORAL | Status: DC
  Administered 2019-06-09 – 2019-06-16 (×9): 400 mg via ORAL
  Filled 2019-06-08 (×11): qty 1

## 2019-06-08 MED ORDER — ISOSORBIDE MONONITRATE ER 30 MG PO TB24
30.0000 mg | ORAL_TABLET | Freq: Every day | ORAL | Status: DC
  Administered 2019-06-08 – 2019-06-17 (×10): 30 mg via ORAL
  Filled 2019-06-08 (×10): qty 1

## 2019-06-08 MED ORDER — HEPARIN SODIUM (PORCINE) 5000 UNIT/ML IJ SOLN
5000.0000 [IU] | Freq: Three times a day (TID) | INTRAMUSCULAR | Status: DC
  Administered 2019-06-08 – 2019-06-10 (×7): 5000 [IU] via SUBCUTANEOUS
  Filled 2019-06-08 (×7): qty 1

## 2019-06-08 MED ORDER — VITAMIN B-12 1000 MCG PO TABS
1000.0000 ug | ORAL_TABLET | Freq: Every evening | ORAL | Status: DC
  Administered 2019-06-09 – 2019-06-16 (×8): 1000 ug via ORAL
  Filled 2019-06-08 (×8): qty 1

## 2019-06-08 MED ORDER — SODIUM CHLORIDE 0.9 % IV SOLN
500.0000 mg | INTRAVENOUS | Status: DC
  Administered 2019-06-08 – 2019-06-14 (×7): 500 mg via INTRAVENOUS
  Filled 2019-06-08 (×7): qty 500

## 2019-06-08 MED ORDER — INSULIN ASPART 100 UNIT/ML ~~LOC~~ SOLN
2.0000 [IU] | Freq: Three times a day (TID) | SUBCUTANEOUS | Status: DC
  Administered 2019-06-08 – 2019-06-17 (×20): 2 [IU] via SUBCUTANEOUS

## 2019-06-08 MED ORDER — SODIUM CHLORIDE 0.9 % IV SOLN: 200.0000 mg | Freq: Once | INTRAVENOUS | Status: DC

## 2019-06-08 MED ORDER — INSULIN ASPART 100 UNIT/ML ~~LOC~~ SOLN
0.0000 [IU] | Freq: Every day | SUBCUTANEOUS | Status: DC
  Administered 2019-06-10: 2 [IU] via SUBCUTANEOUS
  Administered 2019-06-14: 3 [IU] via SUBCUTANEOUS

## 2019-06-08 MED ORDER — DEXAMETHASONE SODIUM PHOSPHATE 10 MG/ML IJ SOLN
6.0000 mg | Freq: Every day | INTRAMUSCULAR | Status: AC
  Administered 2019-06-08 – 2019-06-17 (×10): 6 mg via INTRAVENOUS
  Filled 2019-06-08 (×11): qty 1

## 2019-06-08 MED ORDER — IPRATROPIUM-ALBUTEROL 20-100 MCG/ACT IN AERS
1.0000 | INHALATION_SPRAY | Freq: Four times a day (QID) | RESPIRATORY_TRACT | Status: DC
  Administered 2019-06-08 – 2019-06-17 (×33): 1 via RESPIRATORY_TRACT
  Filled 2019-06-08: qty 4

## 2019-06-08 MED ORDER — SODIUM CHLORIDE 0.9 % IV SOLN
2.0000 g | INTRAVENOUS | Status: DC
  Administered 2019-06-08 – 2019-06-14 (×7): 2 g via INTRAVENOUS
  Filled 2019-06-08: qty 2
  Filled 2019-06-08 (×2): qty 20
  Filled 2019-06-08 (×2): qty 2
  Filled 2019-06-08: qty 20
  Filled 2019-06-08: qty 2

## 2019-06-08 MED ORDER — INSULIN ASPART 100 UNIT/ML ~~LOC~~ SOLN
0.0000 [IU] | Freq: Three times a day (TID) | SUBCUTANEOUS | Status: DC
  Administered 2019-06-08: 2 [IU] via SUBCUTANEOUS
  Administered 2019-06-08: 3 [IU] via SUBCUTANEOUS
  Administered 2019-06-08: 2 [IU] via SUBCUTANEOUS
  Administered 2019-06-09: 1 [IU] via SUBCUTANEOUS
  Administered 2019-06-10 (×2): 2 [IU] via SUBCUTANEOUS
  Administered 2019-06-10: 1 [IU] via SUBCUTANEOUS
  Administered 2019-06-11: 2 [IU] via SUBCUTANEOUS
  Administered 2019-06-11: 1 [IU] via SUBCUTANEOUS
  Administered 2019-06-12: 3 [IU] via SUBCUTANEOUS
  Administered 2019-06-13: 2 [IU] via SUBCUTANEOUS
  Administered 2019-06-14: 1 [IU] via SUBCUTANEOUS
  Administered 2019-06-14 – 2019-06-16 (×5): 2 [IU] via SUBCUTANEOUS
  Administered 2019-06-17: 1 [IU] via SUBCUTANEOUS

## 2019-06-08 MED ORDER — CHLORHEXIDINE GLUCONATE CLOTH 2 % EX PADS
6.0000 | MEDICATED_PAD | Freq: Every day | CUTANEOUS | Status: DC
  Administered 2019-06-10 – 2019-06-16 (×7): 6 via TOPICAL

## 2019-06-08 MED ORDER — MOMETASONE FURO-FORMOTEROL FUM 100-5 MCG/ACT IN AERO
2.0000 | INHALATION_SPRAY | Freq: Two times a day (BID) | RESPIRATORY_TRACT | Status: DC
  Administered 2019-06-08 – 2019-06-17 (×18): 2 via RESPIRATORY_TRACT
  Filled 2019-06-08: qty 8.8

## 2019-06-08 MED ORDER — ALPRAZOLAM 0.5 MG PO TABS
1.0000 mg | ORAL_TABLET | Freq: Every evening | ORAL | Status: DC | PRN
  Administered 2019-06-09: 1 mg via ORAL
  Filled 2019-06-08: qty 4

## 2019-06-08 MED ORDER — SODIUM CHLORIDE 0.9 % IV SOLN
200.0000 mg | Freq: Once | INTRAVENOUS | Status: AC
  Administered 2019-06-08: 200 mg via INTRAVENOUS
  Filled 2019-06-08: qty 200

## 2019-06-08 MED ORDER — SODIUM CHLORIDE 0.9% FLUSH
3.0000 mL | Freq: Two times a day (BID) | INTRAVENOUS | Status: DC
  Administered 2019-06-09 – 2019-06-17 (×17): 3 mL via INTRAVENOUS

## 2019-06-08 NOTE — ED Notes (Signed)
Breakfast ordered 

## 2019-06-08 NOTE — ED Notes (Signed)
Patient transferred to hospital bed.  

## 2019-06-08 NOTE — Consult Note (Signed)
[facility] Renal Consultation Note    Indication for Consultation:  Management of ESRD/hemodialysis; anemia, hypertension/volume and secondary hyperparathyroidism  HPI: Thomas [name] is a 79 y.o. male.  Thomas [name] has a PMH significant for COPD, CAD, DM, HTN, h/o nasal cancer, and ESRD who presented to [facility] ED on 06/07/19 with a 4 day history of generalized weakness, low BP's, cough, shortness of breath, and diarrhea.  In the ED he was noted to be febrile, hypoxic, and CXR consistent with interstitial edema.  His covid tests were + and he was admitted for further treatment of his covid pneumonia.  We were consulted to provide HD during his hospitalization.    Past Medical History:  Diagnosis Date  . Anemia   . Arthritis    hands, back  . Cancer (HCC)    nose  . Cardiac arrest ([place])   . Cataract   . CHF (congestive heart failure) ([place])   . COPD (chronic obstructive pulmonary disease) ([place])   . Coronary artery disease   . Diabetes mellitus ([place])    Type II  . Diverticulosis 2013   tcs by RMR  . Diverticulosis   . Erosive esophagitis   . ESRD on hemodialysis (HCC)    CKD, hemodialysis Tues/Thurs/Sat  . Family history of adverse reaction to anesthesia    Daughter slow to awaken and extreme nausea  . GERD (gastroesophageal reflux disease)   . Headache   . Hiatal hernia    pt not aware of this  . HTN (hypertension)   . Hypercholesterolemia   . Hypothyroidism   . Irritable bowel syndrome (IBS)   . Jerking    hands or feet- 01/2019- has decreased  . Lymphocytic colitis 2013   tcs by RMR  . Myocardial infarction ([place])    X2  . Nasal mass   . Neuropathy   . Pneumonia   . Schatzki's ring   . SDH (subdural hematoma) ([place])    02/2018, conservative management  . Shortness of breath   . Sleep apnea    uses O2 only  . Supplemental oxygen dependent    At HS and PRN during the day  . Tobacco abuse   . Wears dentures   . Wears glasses    Past Surgical History:   Procedure Laterality Date  . A/V FISTULAGRAM N/A 04/14/2019   Procedure: A/V FISTULAGRAM - Left Arm;  Surgeon: [name], MD;  Location: [place] [facility];  Service: Cardiovascular;  Laterality: N/A;  . AV FISTULA PLACEMENT Left 06/29/2013   Procedure: LEFT RADIAL/CEPHALIC FISTULA;  Surgeon: Conrad Chilchinbito, MD;  Location: [place];  Service: Vascular;  Laterality: Left;  . BASCILIC VEIN TRANSPOSITION Left 01/21/2019   Procedure: left arm Bascilic Vein Transposition;  Surgeon: Waynetta Sandy, MD;  Location: [place];  Service: Vascular;  Laterality: Left;  . cardiac stents     5  . CATARACT EXTRACTION W/PHACO Left 05/17/2014   Procedure: CATARACT EXTRACTION PHACO AND INTRAOCULAR LENS PLACEMENT (IOC);  Surgeon: Tonny Branch, MD;  Location: [facility];  Service: Ophthalmology;  Laterality: Left;  CDE:7.71  . CATARACT EXTRACTION W/PHACO Right 06/14/2014   Procedure: CATARACT EXTRACTION PHACO AND INTRAOCULAR LENS PLACEMENT RIGHT EYE;  Surgeon: Tonny Branch, MD;  Location: [facility];  Service: Ophthalmology;  Laterality: Right;  CDE:8.22  . CHOLECYSTECTOMY    . COLONOSCOPY  December 2007   adenomatous polyps, sigmoid diverticula  . COLONOSCOPY  02/06/2012   Dr. Gala Romney- colonic diverticulosis, bx consistent with lymphocytic  colitis  . CORONARY ANGIOPLASTY     x5 stents  . ESOPHAGOGASTRODUODENOSCOPY  May 2009   RMR: non-critical Schatzki's ring, sp dilation with 107 F [place], small inlet patch, mild erosive relufx esophagitis  . ESOPHAGOGASTRODUODENOSCOPY N/A 07/15/2012   Dr. Gala Romney- hiatal hernia, duodenal ulcer with surrounding erosions. this may well be related to the "haziness" sen around the head of the pancreas and duodenum on recent noncontrast ct scan and may have much to do with his abdominal pain.  Marland Kitchen EXCHANGE OF A DIALYSIS CATHETER Right 01/21/2019   Procedure: EXCHANGE OF A DIALYSIS CATHETER, right internal jugular;  Surgeon: Waynetta Sandy, MD;  Location: [place];  Service: Vascular;   Laterality: Right;  . EXCISION NASAL MASS N/A 07/18/2015   Procedure: EXCISION OF NASAL MASS;  Surgeon: Leta Baptist, MD;  Location: [place];  Service: ENT;  Laterality: N/A;  . INSERTION OF DIALYSIS CATHETER Right 05/21/2013   Procedure: INSERTION OF DIALYSIS CATHETER;  Surgeon: Conrad Nickelsville, MD;  Location: [place];  Service: Vascular;  Laterality: Right;  . IR DIALY SHUNT INTRO [place] W/IMG LEFT Left 10/29/2018  . IR FLUORO GUIDE CV LINE RIGHT  11/05/2018  . IR FLUORO GUIDE CV LINE RIGHT  02/04/2019  . IR REMOVAL TUN CV CATH W/O FL  04/23/2019  . IR US GUIDE VASC ACCESS LEFT  10/29/2018  . IR US GUIDE VASC ACCESS RIGHT  11/05/2018  . LIGATION OF COMPETING BRANCHES OF ARTERIOVENOUS FISTULA Left 11/27/2018   Procedure: Ligation Of Competing Branches Of Arteriovenous Fistula;  Surgeon: [name], MD;  Location: [facility] OR;  Service: Vascular;  Laterality: Left;  Marland Kitchen MULTIPLE TOOTH EXTRACTIONS    . REVISION OF ARTERIOVENOUS GORETEX GRAFT Left 11/27/2018   Procedure: Revision of Left Arm Arteriovenious Fistula;  Surgeon: [name], MD;  Location: [facility] OR;  Service: Vascular;  Laterality: Left;  . THROMBECTOMY W/ EMBOLECTOMY Left 11/27/2018   Procedure: Thrombectomy Arteriovenous Fistula;  Surgeon: [name], MD;  Location: [facility] OR;  Service: Vascular;  Laterality: Left;  Marland Kitchen VASECTOMY     Family History:   Family History  Problem Relation Age of Onset  . Cancer Mother   . Cancer Father   . Diabetes Brother   . Cancer Brother   . Diabetes Sister   . Cancer Sister   . Cancer Sister   . Colon cancer Neg Hx    Social History:  reports that he quit smoking about 4 years ago. His smoking use included e-cigarettes and cigarettes. He smoked 0.00 packs per day for 60.00 years. He has never used smokeless tobacco. He reports that he does not drink alcohol or use drugs. Allergies  Allergen Reactions  . Ace Inhibitors Anaphylaxis    Swelling in throat and neck  . Statins Anaphylaxis and  Swelling    Caused feet, tongue and throat to swell.  . Codeine Other (See Comments)    Muscle Spasms  . Tape Hives   Prior to Admission medications   Medication Sig Start Date End Date Taking? Authorizing Provider  acetaminophen (TYLENOL) 500 MG tablet Take 1,000 mg by mouth every 6 (six) hours as needed for mild pain or headache.   Yes [provider]  albuterol (PROVENTIL) (2.5 MG/3ML) 0.083% nebulizer solution Take 2.5 mg by nebulization every 6 (six) hours as needed for wheezing or shortness of breath.   Yes [provider]  albuterol (PROVENTIL,VENTOLIN) 2 MG/5ML syrup Take 2 mg by mouth See admin instructions. Use 2  mg by mouth scheduled twice daily, may take an additional dose if needed for respiratory issues.   Yes [provider]  albuterol (VENTOLIN HFA) 108 (90 Base) MCG/ACT inhaler Inhale 1-2 puffs into the lungs every 4 (four) hours as needed for wheezing or shortness of breath.   Yes [provider]  ALPRAZolam Duanne Moron) 1 MG tablet Take 1 mg by mouth at bedtime as needed for sleep.  07/30/18  Yes [provider]  amLODipine (NORVASC) 10 MG tablet Take 10 mg by mouth daily.    Yes [provider]  b complex-vitamin c-folic acid (NEPHRO-VITE) 0.8 MG TABS tablet Take 1 tablet by mouth at bedtime.    Yes [provider]  calcium acetate (PHOSLO) 667 MG capsule Take 667-1,334 mg by mouth See admin instructions. Take 2 capsules (1334 mg) by mouth with each meal & take 1 capsule (667 mg) by mouth with each snack.   Yes [provider]  Calcium Carb-Cholecalciferol (CALCIUM + D3 PO) Take 1 tablet by mouth daily.    Yes [provider]  Cholecalciferol (VITAMIN D3) 50 MCG (2000 UT) capsule Take 2,000 Units by mouth 2 (two) times daily.    Yes [provider]  dicyclomine (BENTYL) 20 MG tablet Take 20 mg by mouth See admin instructions. Take 1 tablet (20 mg) by mouth scheduled at night, may take up to 3  additional doses as needed for abdominal issues .   Yes [provider]  diphenhydrAMINE (BENADRYL) 25 MG tablet Take 25 mg by mouth every Monday, Wednesday, and Friday.   Yes [provider]  diphenhydrAMINE-zinc acetate (BENADRYL) cream Apply topically 2 (two) times daily as needed for itching. 01/30/19  Yes Jeanmarie Hubert, MD  gabapentin (NEURONTIN) 300 MG capsule Take 300 mg by mouth See admin instructions. Take 1 capsule (300 mg) by mouth scheduled every morning, may take an additional capsule if needed for pain.   Yes [provider]  hydrochlorothiazide (HYDRODIURIL) 12.5 MG tablet Take 12.5 mg by mouth daily.   Yes [provider]  hydrocortisone cream 1 % Apply 1 application topically 2 (two) times daily as needed for itching.   Yes [provider]  insulin lispro (HUMALOG) 100 UNIT/ML injection Inject 10 Units into the skin 3 (three) times daily with meals.    Yes [provider]  isosorbide mononitrate (IMDUR) 30 MG 24 hr tablet Take 30 mg by mouth daily.  03/03/12  Yes [provider]  LANTUS 100 UNIT/ML injection Inject 20-30 Units into the skin at bedtime.  01/02/19  Yes [provider]  lidocaine-prilocaine (EMLA) cream Apply 1 application topically as needed (port access).   Yes [provider]  loperamide (IMODIUM) 1 MG/5ML solution Take 1-2 mg by mouth as needed for diarrhea or loose stools.   Yes [provider]  losartan (COZAAR) 100 MG tablet Take 100 mg by mouth daily.  02/19/18  Yes [provider]  magnesium oxide (MAG-OX) 400 MG tablet Take 400 mg by mouth at bedtime.    Yes [provider]  metoprolol (LOPRESSOR) 100 MG tablet Take 100 mg by mouth 2 (two) times daily.  12/07/11  Yes [provider]  Naphazoline-Glycerin (REDNESS RELIEF OP) Place 1 drop into both eyes daily as needed (redness/ dryness).   Yes [provider]  NITROSTAT 0.4 MG SL tablet  Place 0.4 mg under the tongue every 5 (five) minutes as needed for chest pain.  06/01/13  Yes [provider]  Omega-3 Fatty Acids (FISH OIL) 1000 MG CAPS Take 2,000 mg by mouth 2 (two) times a day.   Yes [provider]  omeprazole (PRILOSEC) 20 MG capsule Take 20 mg by mouth 2 (two) times daily.    Yes [provider]  ondansetron (ZOFRAN) 4 MG tablet Take 4 mg by mouth every 6 (six) hours as needed for nausea or vomiting.    Yes [provider]  primidone (MYSOLINE) 50 MG tablet Take 50 mg by mouth at bedtime. 06/17/18  Yes [provider]  STIOLTO RESPIMAT 2.5-2.5 MCG/ACT AERS Inhale 2 puffs into the lungs at bedtime. 09/24/18  Yes [provider]  vitamin B-12 (CYANOCOBALAMIN) 1000 MCG tablet Take 1,000 mcg by mouth every evening.   Yes [provider]  oxyCODONE-acetaminophen (PERCOCET/ROXICET) 5-325 MG tablet Take 1 tablet by mouth every 6 (six) hours as needed. Patient not taking: Reported on 04/10/2019 01/21/19   Ulyses Amor, PA-C   Current Facility-Administered Medications  Medication Dose Route Frequency Provider Last Rate Last Admin  . azithromycin (ZITHROMAX) 500 mg in sodium chloride 0.9 % 250 mL IVPB  500 mg Intravenous Q24H Opyd, Ilene Qua, MD   Stopped at 06/08/19 0515  . cefTRIAXone (ROCEPHIN) 2 g in sodium chloride 0.9 % 100 mL IVPB  2 g Intravenous Q24H Opyd, Ilene Qua, MD   Stopped at 06/08/19 0353  . Chlorhexidine Gluconate Cloth 2 % PADS 6 each  6 each Topical Q0600 Hason Ofarrell, Broadus John, MD      . dexamethasone (DECADRON) injection 6 mg  6 mg Intravenous Daily Opyd, Ilene Qua, MD   6 mg at 06/08/19 0512  . heparin injection 5,000 Units  5,000 Units Subcutaneous Q8H Vianne Bulls, MD   5,000 Units at 06/08/19 [phone]  . insulin aspart (novoLOG) injection 0-5 Units  0-5 Units Subcutaneous QHS Opyd, Timothy S, MD      . insulin aspart (novoLOG) injection 0-9 Units  0-9 Units Subcutaneous TID WC Opyd, Ilene Qua, MD   2  Units at 06/08/19 [phone]  . insulin aspart (novoLOG) injection 2 Units  2 Units Subcutaneous TID WC Opyd, Ilene Qua, MD   2 Units at 06/08/19 [phone]  . insulin glargine (LANTUS) injection 8 Units  8 Units Subcutaneous BID Opyd, Ilene Qua, MD   8 Units at 06/08/19 1006  . Ipratropium-Albuterol (COMBIVENT) respimat 1 puff  1 puff Inhalation Q6H Opyd, Ilene Qua, MD   1 puff at 06/08/19 1005  . mometasone-formoterol (DULERA) 100-5 MCG/ACT inhaler 2 puff  2 puff Inhalation BID Opyd, Ilene Qua, MD   2 puff at 06/08/19 1002  . [START ON 06/09/2019] remdesivir 100 mg in sodium chloride 0.9 % 100 mL IVPB  100 mg Intravenous Daily Opyd, Timothy S, MD      . sodium chloride flush (NS) 0.9 % injection 3 mL  3 mL Intravenous Q12H Opyd, Ilene Qua, MD       Current Outpatient Medications  Medication Sig Dispense Refill  . acetaminophen (TYLENOL) 500 MG tablet Take 1,000 mg by mouth every 6 (six) hours as needed for mild pain or headache.    . albuterol (PROVENTIL) (2.5 MG/3ML) 0.083% nebulizer solution Take 2.5 mg by nebulization every 6 (six) hours as needed for wheezing or shortness of breath.    Marland Kitchen albuterol (PROVENTIL,VENTOLIN) 2 MG/5ML syrup Take 2 mg by mouth See admin instructions. Use 2 mg by mouth scheduled twice daily, may take an additional dose if needed for respiratory issues.    Marland Kitchen  albuterol (VENTOLIN HFA) 108 (90 Base) MCG/ACT inhaler Inhale 1-2 puffs into the lungs every 4 (four) hours as needed for wheezing or shortness of breath.    . ALPRAZolam (XANAX) 1 MG tablet Take 1 mg by mouth at bedtime as needed for sleep.     Marland Kitchen amLODipine (NORVASC) 10 MG tablet Take 10 mg by mouth daily.     Marland Kitchen b complex-vitamin c-folic acid (NEPHRO-VITE) 0.8 MG TABS tablet Take 1 tablet by mouth at bedtime.     . calcium acetate (PHOSLO) 667 MG capsule Take 667-1,334 mg by mouth See admin instructions. Take 2 capsules (1334 mg) by mouth with each meal & take 1 capsule (667 mg) by mouth with each snack.    . Calcium  Carb-Cholecalciferol (CALCIUM + D3 PO) Take 1 tablet by mouth daily.     . Cholecalciferol (VITAMIN D3) 50 MCG (2000 UT) capsule Take 2,000 Units by mouth 2 (two) times daily.     Marland Kitchen dicyclomine (BENTYL) 20 MG tablet Take 20 mg by mouth See admin instructions. Take 1 tablet (20 mg) by mouth scheduled at night, may take up to 3 additional doses as needed for abdominal issues .    . diphenhydrAMINE (BENADRYL) 25 MG tablet Take 25 mg by mouth every Monday, Wednesday, and Friday.    . diphenhydrAMINE-zinc acetate (BENADRYL) cream Apply topically 2 (two) times daily as needed for itching. 28.4 g 0  . gabapentin (NEURONTIN) 300 MG capsule Take 300 mg by mouth See admin instructions. Take 1 capsule (300 mg) by mouth scheduled every morning, may take an additional capsule if needed for pain.    . hydrochlorothiazide (HYDRODIURIL) 12.5 MG tablet Take 12.5 mg by mouth daily.    . hydrocortisone cream 1 % Apply 1 application topically 2 (two) times daily as needed for itching.    . insulin lispro (HUMALOG) 100 UNIT/ML injection Inject 10 Units into the skin 3 (three) times daily with meals.     . isosorbide mononitrate (IMDUR) 30 MG 24 hr tablet Take 30 mg by mouth daily.     Marland Kitchen LANTUS 100 UNIT/ML injection Inject 20-30 Units into the skin at bedtime.     . lidocaine-prilocaine (EMLA) cream Apply 1 application topically as needed (port access).    Marland Kitchen loperamide (IMODIUM) 1 MG/5ML solution Take 1-2 mg by mouth as needed for diarrhea or loose stools.    Marland Kitchen losartan (COZAAR) 100 MG tablet Take 100 mg by mouth daily.     . magnesium oxide (MAG-OX) 400 MG tablet Take 400 mg by mouth at bedtime.     . metoprolol (LOPRESSOR) 100 MG tablet Take 100 mg by mouth 2 (two) times daily.     . Naphazoline-Glycerin (REDNESS RELIEF OP) Place 1 drop into both eyes daily as needed (redness/ dryness).    Marland Kitchen NITROSTAT 0.4 MG SL tablet Place 0.4 mg under the tongue every 5 (five) minutes as needed for chest pain.     . Omega-3 Fatty  Acids (FISH OIL) 1000 MG CAPS Take 2,000 mg by mouth 2 (two) times a day.    Marland Kitchen omeprazole (PRILOSEC) 20 MG capsule Take 20 mg by mouth 2 (two) times daily.     . ondansetron (ZOFRAN) 4 MG tablet Take 4 mg by mouth every 6 (six) hours as needed for nausea or vomiting.     . primidone (MYSOLINE) 50 MG tablet Take 50 mg by mouth at bedtime.    Marland Kitchen STIOLTO RESPIMAT 2.5-2.5 MCG/ACT AERS Inhale 2 puffs into  the lungs at bedtime.    . vitamin B-12 (CYANOCOBALAMIN) 1000 MCG tablet Take 1,000 mcg by mouth every evening.    Marland Kitchen oxyCODONE-acetaminophen (PERCOCET/ROXICET) 5-325 MG tablet Take 1 tablet by mouth every 6 (six) hours as needed. (Patient not taking: Reported on 04/10/2019) 12 tablet 0   Labs: Basic Metabolic Panel: Recent Labs  Lab 06/07/19 2330  NA 134*  K 4.8  CL 94*  CO2 16*  GLUCOSE 179*  BUN 88*  CREATININE 13.68*  CALCIUM 7.1*   Liver Function Tests: Recent Labs  Lab 06/07/19 2330  AST 44*  ALT 44  ALKPHOS 102  BILITOT 0.4  PROT 6.9  ALBUMIN 2.8*   No results for input(s): LIPASE, AMYLASE in the last 168 hours. No results for input(s): AMMONIA in the last 168 hours. CBC: Recent Labs  Lab 06/07/19 2226  WBC 5.4  NEUTROABS 3.9  HGB 14.3  HCT 49.8  MCV 94.7  PLT 117*   Cardiac Enzymes: No results for input(s): CKTOTAL, CKMB, CKMBINDEX, TROPONINI in the last 168 hours. CBG: Recent Labs  Lab 06/08/19 0808  GLUCAP 129*   Iron Studies:  Recent Labs    06/07/19 2330  FERRITIN 1,318*   Studies/Results: DG Chest Port 1 View  Result Date: 06/07/2019 CLINICAL DATA:  Weakness.  Hypotension.  Dialysis patient. EXAM: PORTABLE CHEST 1 VIEW COMPARISON:  04/23/2019 FINDINGS: Previously seen right internal jugular catheter has been removed. Mild cardiomegaly as seen previously. Abnormal interstitial pulmonary density probably indicating fluid overload/congestive heart failure. This is asymmetrically more pronounced on the left, and left lung pneumonia is not excluded.  IMPRESSION: Probable fluid overload/interstitial edema. Findings are asymmetrically more pronounced on the left, raising the possibility pneumonia. Electronically Signed   By: Nelson Chimes M.D.   On: 06/07/2019 21:53    ROS: Pertinent items are noted in HPI. Physical Exam: Vitals:   06/08/19 0445 06/08/19 0500 06/08/19 0515 06/08/19 0530  BP: (!) 100/50 (!) 99/55 (!) 108/55 (!) 130/99  Pulse: 77 74 73 84  Resp: (!) 22 19 (!) 25 (!) 26  Temp:      TempSrc:      SpO2: 90% (!) 88% (!) 89% 92%      Weight change:  No intake or output data in the 24 hours ending 06/08/19 1100 BP (!) 130/99   Pulse 84   Temp 99.2 F (37.3 C) (Oral)   Resp (!) 26   SpO2 92%  Physical exam: unable to complete due to COVID + status.  In order to preserve PPE equipment and to minimize exposure to providers.  Notes from other caregivers reviewed  Dialysis Access: Left AVF  Dialysis Orders: [name]  on MWF . EDW 94.5kg HD Bath 1K/2.5Ca  Time 4 hours Heparin 1600units IVP bolus then 1400 units/hr. Access LAVF BFR 350 DFR 600    Hectoral 4.5 mcg IV/HD Venofer  50 mg weekly    Assessment/Plan: 1.  Covid-19 pneumonia- started on rocephin, azithromycin in ED as well as decadron and remdesivir. 2.  ESRD -  Plan for HD today when he gets a room 3.  Hypertension/volume  - BP on low side.  Follow with UF 4.  Anemia  - No esa due to Hgb >12 5.  Metabolic bone disease -  Cont with home meds 6.  Nutrition -  Renal diet. 7. DM- per primary svc. 8. COPD- was on home o2 prior to admission.   Donetta Potts, MD [place] Pager [phone]  06/08/2019, 11:00 AM

## 2019-06-08 NOTE — ED Notes (Signed)
Called daughter for patient and he was able to talk with her.

## 2019-06-08 NOTE — ED Notes (Signed)
Dr Roxanne Mins and RN Annie Main informed of pos covid

## 2019-06-08 NOTE — ED Notes (Signed)
Called daughter Irene Shipper [phone] and updated with patient condition.

## 2019-06-08 NOTE — ED Notes (Signed)
+   Tele Toney Reil daughter [phone] looking for an update

## 2019-06-08 NOTE — ED Provider Notes (Signed)
Care assumed from Dr. Vallery Ridge, patient presented with fever and diarrhea and shortness of breath.  X-ray shows fluid overload with possible pneumonia.  Labs are significant for mild thrombocytopenia which is not clinically significant.  Lactic acid level is normal.  He is started on antibiotics for community-acquired pneumonia.  Case was discussed with Dr. Myna Hidalgo of [facility], who agrees to admit the patient.   Delora Fuel, MD 20/91/06 [phone]

## 2019-06-08 NOTE — H&P (Signed)
History and Physical    [name]:[id number] DOB: [birth year] DOA: 06/07/2019  PCP: Medicine, [facility]   Patient coming from: Home   Chief Complaint: gen weakness, low BP, cough, SOB, diarrhea   HPI: Thomas [name] is a 79 y.o. 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  Patient symptoms began on 06/03/2019 with loose stools and generalized weakness.  He also went on to develop increase in his chronic dyspnea and had a negative rapid Covid test on 06/05/2019.  He denies any abdominal pain or chest pain. Wife reports that his chronic jerking movements have worsened over the past week.   ED Course: Upon arrival to the ED, patient is found to be febrile to 38.3 C, saturating mid 90s on his usual 2 to 3 Lpm, and with blood pressure 109/43.  EKG features a sinus rhythm and chest x-ray consistent with fluid overload/interstitial edema, more so on the left with possible pneumonia.  Chemistry panel features a bicarbonate of 16, BUN 88, glucose 179, albumin 2.8, and calcium 7.1.  CBC notable for a new thrombocytopenia with platelets 117,000.  Procalcitonin is elevated to 2.85, CRP elevated to 17.2, and D-dimer elevated to 1.87.  Covid antigen test was positive.  Blood cultures were obtained in the emergency department and the patient was started on Rocephin and azithromycin.  Review of Systems:  All other systems reviewed and apart from HPI, are negative.  Past Medical History:  Diagnosis Date  . Anemia   . Arthritis    hands, back  . Cancer (HCC)    nose  . Cardiac arrest ([place])   . Cataract   . CHF (congestive heart failure) ([place])   . COPD  (chronic obstructive pulmonary disease) ([place])   . Coronary artery disease   . Diabetes mellitus ([place])    Type II  . Diverticulosis 2013   tcs by RMR  . Diverticulosis   . Erosive esophagitis   . ESRD on hemodialysis (HCC)    CKD, hemodialysis Tues/Thurs/Sat  . Family history of adverse reaction to anesthesia    Daughter slow to awaken and extreme nausea  . GERD (gastroesophageal reflux disease)   . Headache   . Hiatal hernia    pt not aware of this  . HTN (hypertension)   . Hypercholesterolemia   . Hypothyroidism   . Irritable bowel syndrome (IBS)   . Jerking    hands or feet- 01/2019- has decreased  . Lymphocytic colitis 2013   tcs by RMR  . Myocardial infarction ([place])    X2  . Nasal mass   . Neuropathy   . Pneumonia   . Schatzki's ring   . SDH (subdural hematoma) ([place])    02/2018, conservative management  . Shortness of breath   . Sleep apnea    uses O2 only  . Supplemental oxygen dependent    At HS and PRN during the day  . Tobacco abuse   . Wears dentures   . Wears glasses     Past Surgical History:  Procedure Laterality Date  . A/V FISTULAGRAM N/A 04/14/2019   Procedure: A/V FISTULAGRAM - Left Arm;  Surgeon: [name], MD;  Location: [facility];  Service: Cardiovascular;  Laterality: N/A;  .  AV FISTULA PLACEMENT Left 06/29/2013   Procedure: LEFT RADIAL/CEPHALIC FISTULA;  Surgeon: Conrad Zebulon, MD;  Location: [facility];  Service: Vascular;  Laterality: Left;  . BASCILIC VEIN TRANSPOSITION Left 01/21/2019   Procedure: left arm Bascilic Vein Transposition;  Surgeon: Waynetta Sandy, MD;  Location: [facility];  Service: Vascular;  Laterality: Left;  . cardiac stents     5  . CATARACT EXTRACTION W/PHACO Left 05/17/2014   Procedure: CATARACT EXTRACTION PHACO AND INTRAOCULAR LENS PLACEMENT (IOC);  Surgeon: Tonny Branch, MD;  Location: [facility];  Service: Ophthalmology;  Laterality: Left;  CDE:7.71  . CATARACT EXTRACTION W/PHACO Right 06/14/2014   Procedure:  CATARACT EXTRACTION PHACO AND INTRAOCULAR LENS PLACEMENT RIGHT EYE;  Surgeon: Tonny Branch, MD;  Location: [facility];  Service: Ophthalmology;  Laterality: Right;  CDE:8.22  . CHOLECYSTECTOMY    . COLONOSCOPY  December 2007   adenomatous polyps, sigmoid diverticula  . COLONOSCOPY  02/06/2012   Dr. Gala Romney- colonic diverticulosis, bx consistent with lymphocytic colitis  . CORONARY ANGIOPLASTY     x5 stents  . ESOPHAGOGASTRODUODENOSCOPY  May 2009   RMR: non-critical Schatzki's ring, sp dilation with 36 F Maloney, small inlet patch, mild erosive relufx esophagitis  . ESOPHAGOGASTRODUODENOSCOPY N/A 07/15/2012   Dr. Gala Romney- hiatal hernia, duodenal ulcer with surrounding erosions. this may well be related to the "haziness" sen around the head of the pancreas and duodenum on recent noncontrast ct scan and may have much to do with his abdominal pain.  Marland Kitchen EXCHANGE OF A DIALYSIS CATHETER Right 01/21/2019   Procedure: EXCHANGE OF A DIALYSIS CATHETER, right internal jugular;  Surgeon: Waynetta Sandy, MD;  Location: [facility];  Service: Vascular;  Laterality: Right;  . EXCISION NASAL MASS N/A 07/18/2015   Procedure: EXCISION OF NASAL MASS;  Surgeon: Leta Baptist, MD;  Location: [facility];  Service: ENT;  Laterality: N/A;  . INSERTION OF DIALYSIS CATHETER Right 05/21/2013   Procedure: INSERTION OF DIALYSIS CATHETER;  Surgeon: Conrad Franklin, MD;  Location: [facility];  Service: Vascular;  Laterality: Right;  . IR DIALY SHUNT INTRO [place] W/IMG LEFT Left 10/29/2018  . IR FLUORO GUIDE CV LINE RIGHT  11/05/2018  . IR FLUORO GUIDE CV LINE RIGHT  02/04/2019  . IR REMOVAL TUN CV CATH W/O FL  04/23/2019  . IR US GUIDE VASC ACCESS LEFT  10/29/2018  . IR US GUIDE VASC ACCESS RIGHT  11/05/2018  . LIGATION OF COMPETING BRANCHES OF ARTERIOVENOUS FISTULA Left 11/27/2018   Procedure: Ligation Of Competing Branches Of Arteriovenous Fistula;  Surgeon: [name], MD;  Location: [facility];  Service: Vascular;  Laterality: Left;  Marland Kitchen  MULTIPLE TOOTH EXTRACTIONS    . REVISION OF ARTERIOVENOUS GORETEX GRAFT Left 11/27/2018   Procedure: Revision of Left Arm Arteriovenious Fistula;  Surgeon: [name], MD;  Location: [facility];  Service: Vascular;  Laterality: Left;  . THROMBECTOMY W/ EMBOLECTOMY Left 11/27/2018   Procedure: Thrombectomy Arteriovenous Fistula;  Surgeon: [name], MD;  Location: [facility];  Service: Vascular;  Laterality: Left;  Marland Kitchen VASECTOMY       reports that he quit smoking about 4 years ago. His smoking use included e-cigarettes and cigarettes. He smoked 0.00 packs per day for 60.00 years. He has never used smokeless tobacco. He reports that he does not drink alcohol or use drugs.  Allergies  Allergen Reactions  . Ace Inhibitors Anaphylaxis    Swelling in throat and neck  . Statins Anaphylaxis and Swelling  Caused feet, tongue and throat to swell.  . Codeine Other (See Comments)    Muscle Spasms  . Tape Hives    Family History  Problem Relation Age of Onset  . Cancer Mother   . Cancer Father   . Diabetes Brother   . Cancer Brother   . Diabetes Sister   . Cancer Sister   . Cancer Sister   . Colon cancer Neg Hx      Prior to Admission medications   Medication Sig Start Date End Date Taking? Authorizing Provider  albuterol (PROVENTIL) (2.5 MG/3ML) 0.083% nebulizer solution Take 2.5 mg by nebulization every 6 (six) hours as needed for wheezing or shortness of breath.    [provider]  albuterol (PROVENTIL,VENTOLIN) 2 MG/5ML syrup Take 2 mg by mouth See admin instructions. Use 2 mg by mouth scheduled twice daily, may take an additional dose if needed for respiratory issues.    [provider]  albuterol (VENTOLIN HFA) 108 (90 Base) MCG/ACT inhaler Inhale 1-2 puffs into the lungs every 4 (four) hours as needed for wheezing or shortness of breath.    [provider]  ALPRAZolam Duanne Moron) 1 MG tablet Take 1 mg by mouth at bedtime as needed for sleep.  07/30/18    [provider]  amLODipine (NORVASC) 10 MG tablet Take 10 mg by mouth daily.     [provider]  b complex-vitamin c-folic acid (NEPHRO-VITE) 0.8 MG TABS tablet Take 1 tablet by mouth at bedtime.     [provider]  butalbital-acetaminophen-caffeine (FIORICET, ESGIC) 50-325-40 MG per tablet Take 1 tablet by mouth every 6 (six) hours as needed for headache.     [provider]  calcium acetate (PHOSLO) 667 MG capsule Take 667-1,334 mg by mouth See admin instructions. Take 2 capsules (1334 mg) by mouth with each meal & take 1 capsule (667 mg) by mouth with each snack.    [provider]  Calcium Carb-Cholecalciferol (CALCIUM + D3 PO) Take 1 tablet by mouth daily.     [provider]  Cholecalciferol (VITAMIN D3) 50 MCG (2000 UT) capsule Take 4,000 Units by mouth 2 (two) times daily.     [provider]  dicyclomine (BENTYL) 20 MG tablet Take 20 mg by mouth See admin instructions. Take 1 tablet (20 mg) by mouth scheduled at night, may take up to 3 additional doses as needed for abdominal issues .    [provider]  diphenhydrAMINE (BENADRYL) 25 MG tablet Take 25 mg by mouth every Monday, Wednesday, and Friday.    [provider]  diphenhydrAMINE-zinc acetate (BENADRYL) cream Apply topically 2 (two) times daily as needed for itching. 01/30/19   Jeanmarie Hubert, MD  gabapentin (NEURONTIN) 300 MG capsule Take 300 mg by mouth See admin instructions. Take 1 capsule (300 mg) by mouth scheduled every morning, may take an additional capsule if needed for pain.    [provider]  hydrochlorothiazide (HYDRODIURIL) 12.5 MG tablet Take 12.5 mg by mouth daily.    [provider]  hydrocortisone cream 1 % Apply 1 application topically 2 (two) times daily as needed for itching.    [provider]  insulin lispro (HUMALOG) 100 UNIT/ML injection Inject 10 Units into the skin 3 (three) times daily with meals.      [provider]  isosorbide mononitrate (IMDUR) 30 MG 24 hr tablet Take 30 mg by mouth daily.  03/03/12   [provider]  LANTUS 100 UNIT/ML injection Inject  30 Units into the skin at bedtime. 01/02/19   [provider]  lidocaine-prilocaine (EMLA) cream Apply 1 application topically as needed (port access).    [provider]  loperamide (IMODIUM) 1 MG/5ML solution Take 1-2 mg by mouth as needed for diarrhea or loose stools.    [provider]  losartan (COZAAR) 100 MG tablet Take 100 mg by mouth daily.  02/19/18   [provider]  magnesium oxide (MAG-OX) 400 MG tablet Take 400 mg by mouth at bedtime.     [provider]  metoprolol (LOPRESSOR) 100 MG tablet Take 100 mg by mouth 2 (two) times daily.  12/07/11   [provider]  Naphazoline-Glycerin (REDNESS RELIEF OP) Place 1 drop into both eyes daily as needed (redness/ dryness).    [provider]  NITROSTAT 0.4 MG SL tablet Place 0.4 mg under the tongue every 5 (five) minutes as needed for chest pain.  06/01/13   [provider]  Omega-3 Fatty Acids (FISH OIL) 1000 MG CAPS Take 2,000 mg by mouth 2 (two) times a day.    [provider]  omeprazole (PRILOSEC) 20 MG capsule Take 20 mg by mouth 2 (two) times daily.     [provider]  ondansetron (ZOFRAN) 4 MG tablet Take 4 mg by mouth every 6 (six) hours as needed for nausea or vomiting.     [provider]  oxyCODONE-acetaminophen (PERCOCET/ROXICET) 5-325 MG tablet Take 1 tablet by mouth every 6 (six) hours as needed. Patient not taking: Reported on 04/10/2019 01/21/19   Ulyses Amor, PA-C  primidone (MYSOLINE) 50 MG tablet Take 50 mg by mouth at bedtime. 06/17/18   [provider]  STIOLTO RESPIMAT 2.5-2.5 MCG/ACT AERS Inhale 2 puffs into the lungs at bedtime. 09/24/18   [provider]  vitamin B-12 (CYANOCOBALAMIN) 1000 MCG tablet Take 1,000 mcg by mouth every  evening.    [provider]    Physical Exam: Vitals:   06/08/19 0130 06/08/19 0240 06/08/19 0245 06/08/19 0300  BP: 100/64 (!) 101/52 (!) 116/49 (!) 112/50  Pulse: 71 74 71 72  Resp: (!) 23 17 (!) 22 (!) 27  Temp:      TempSrc:      SpO2: 91% 97% 92% 91%    Constitutional: NAD, calm  Eyes: PERTLA, lids and conjunctivae normal ENMT: Mucous membranes are moist. Posterior pharynx clear of any exudate or lesions.   Neck: normal, supple, no masses, no thyromegaly Respiratory: no wheezing, no crackles.  No accessory muscle use.  Cardiovascular: S1 & S2 heard, regular rate and rhythm. No extremity edema.   Abdomen: No distension, no tenderness, soft. Bowel sounds active.  Musculoskeletal: no clubbing / cyanosis. No joint deformity upper and lower extremities.   Skin: no significant rashes, lesions, ulcers. Warm, dry, well-perfused. Neurologic: No gross facial weakness. Sensation intact. Moving all extremities.  Psychiatric: Alert. Calm, cooperative.    Labs on Admission: I have personally reviewed following labs and imaging studies  CBC: Recent Labs  Lab 06/07/19 2226  WBC 5.4  NEUTROABS 3.9  HGB 14.3  HCT 49.8  MCV 94.7  PLT 646*   Basic Metabolic Panel: Recent Labs  Lab 06/07/19 2330  NA 134*  K 4.8  CL 94*  CO2 16*  GLUCOSE 179*  BUN 88*  CREATININE 13.68*  CALCIUM 7.1*   GFR: CrCl cannot be calculated (Unknown ideal weight.). Liver Function Tests: Recent Labs  Lab 06/07/19 2330  AST 44*  ALT 44  ALKPHOS 102  BILITOT 0.4  PROT 6.9  ALBUMIN 2.8*   No results for input(s): LIPASE, AMYLASE in the last 168 hours. No results for input(s): AMMONIA in the last 168 hours. Coagulation Profile: No results for input(s): INR, PROTIME in the last 168 hours. Cardiac Enzymes: No results for input(s): CKTOTAL, CKMB, CKMBINDEX, TROPONINI in the last 168 hours. BNP (last 3 results) No results for input(s): PROBNP in the last 8760 hours. HbA1C: No results  for input(s): HGBA1C in the last 72 hours. CBG: No results for input(s): GLUCAP in the last 168 hours. Lipid Profile: Recent Labs    06/07/19 2330  TRIG 560*   Thyroid Function Tests: No results for input(s): TSH, T4TOTAL, FREET4, T3FREE, THYROIDAB in the last 72 hours. Anemia Panel: Recent Labs    06/07/19 2330  FERRITIN 1,318*   Urine analysis:    Component Value Date/Time   COLORURINE YELLOW 06/15/2008 1856   APPEARANCEUR CLEAR 06/15/2008 1856   LABSPEC >1.030 (H) 06/15/2008 1856   PHURINE 5.0 06/15/2008 1856   GLUCOSEU 250 06/29/2012 1308   HGBUR TRACE (A) 06/15/2008 1856   BILIRUBINUR SMALL (A) 06/15/2008 1856   KETONESUR TRACE (A) 06/15/2008 1856   PROTEINUR >300 (A) 06/15/2008 1856   UROBILINOGEN 0.2 06/15/2008 1856   NITRITE NEGATIVE 06/15/2008 1856   LEUKOCYTESUR NEGATIVE 06/15/2008 1856   Sepsis Labs: @LABRCNTIP (procalcitonin:4,lacticidven:4) ) Recent Results (from the past 240 hour(s))  Blood Culture (routine x 2)     Status: None (Preliminary result)   Collection Time: 06/07/19 10:26 PM   Specimen: BLOOD RIGHT HAND  Result Value Ref Range Status   Specimen Description BLOOD RIGHT HAND  Final   Special Requests   Final    BOTTLES DRAWN AEROBIC AND ANAEROBIC Blood Culture results may not be optimal due to an inadequate volume of blood received in culture bottles Performed at [facility], [place] [address] [place], [place] [address]    Culture PENDING  Incomplete   Report Status PENDING  Incomplete     Radiological Exams on Admission: DG Chest Port 1 View  Result Date: 06/07/2019 CLINICAL DATA:  Weakness.  Hypotension.  Dialysis patient. EXAM: PORTABLE CHEST 1 VIEW COMPARISON:  04/23/2019 FINDINGS: Previously seen right internal jugular catheter has been removed. Mild cardiomegaly as seen previously. Abnormal interstitial pulmonary density probably indicating fluid overload/congestive heart failure. This is asymmetrically more pronounced on the left,  and left lung pneumonia is not excluded. IMPRESSION: Probable fluid overload/interstitial edema. Findings are asymmetrically more pronounced on the left, raising the possibility pneumonia. Electronically Signed   By: Nelson Chimes M.D.   On: 06/07/2019 21:53    EKG: Independently reviewed. Sinus rhythm.   Assessment/Plan   1. Pneumonia, COVID-19  - Presents with 5 days of generalized weakness, loose stools, increased SOB, and is found to be febrile with possible PNA on CXR and COVID-19 Ag positive  - Procalcitonin is elevated and he was started on Rocephin and azithromycin in ED  - Blood cultures collected in ED, check sputum culture, strep pneumo and legionella antigens, continue current antibiotics, start Decadron and remdesivir    2. COPD; chronic hypoxic respiratory failure  - Presents with increased SOB and cough secondary to pneumonia, has stable oxygen saturations on his usual 2-3 Lpm  - Continue inhalers   3. ESRD  - Potassium and BP normal in ED, bicarb 16, BUN 88, and interstitial edema noted without respiratory distress  - SLIV, renally-dose medications, will likely need HD while in the hospital  4. Thrombocytopenia  - Platelets 117k on admission without bleeding  - Likely secondary to the infectious process, will monitor    5. Insulin-dependent DM  - A1c was 7.9% in August 2020  - Check CBG's, continue insulin    6. Hypertension  - BP has been running low and antihypertensives held on admission     DVT prophylaxis: sq heparin  Code Status: Full  Family Communication: Discussed with patient  Consults called: None  Admission status: Observation     Vianne Bulls, MD [facility] Pager [phone]  If 7PM-7AM, please contact night-coverage [email or web address] Password [facility]  06/08/2019, 3:38 AM

## 2019-06-08 NOTE — ED Notes (Signed)
+   Barry Dienes fix daughter [phone] looking for an update on the patient

## 2019-06-08 NOTE — Progress Notes (Signed)
Renal Navigator faxed COVID 19 positive test result to OP HD clinic/[facility]. Patient will need to receive OP HD treatment at [facility] in [place] until he is 20 days past his positive test result.  Renal Navigator to follow closely regarding disposition and discharge date.   Thomas [name], [place] Renal Navigator [phone]

## 2019-06-08 NOTE — Progress Notes (Signed)
PROGRESS NOTE    HAI GRABE  YNW:[id number] DOB: 1940-12-14 DOA: 06/07/2019 PCP: [facility]   Brief Narrative: 79 y.o. 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  Patient symptoms began on 06/03/2019 with loose stools and generalized weakness.  He also went on to develop increase in his chronic dyspnea and had a negative rapid Covid test on 06/05/2019.  He denies any abdominal pain or chest pain. Wife reports that his chronic jerking movements have worsened over the past week.   Evaluation in the ED patient was found to be hypoxic, oxygen sat in 90s on his usual 2 to 3 L of oxygen, chest x-ray consistent with fluid overload interstitial edema possible pneumonia.  Covid antigen test positive.   Assessment & Plan:   Principal Problem:   Pneumonia Active Problems:   Insulin-requiring or dependent type II diabetes mellitus ([place])   Diarrhea of presumed infectious origin   End stage renal disease ([place])   Sepsis ([place])   Thrombocytopenia ([place])   Chronic respiratory failure with hypoxia (H[facility])   COPD (chronic obstructive pulmonary disease) (HCC)   HTN (hypertension)   COVID-19 virus infection   1-Acute on chronic hypoxic respiratory failure, worsening secondary to COVID-19 pneumonia: -Patient presented with weakness, increased shortness of breath chest x-ray with possible pneumonia. -Procalcitonin elevated; started on ceftriaxone and azithromycin continue. -Continue with Decadron and remdesivir. COVID-19 Labs  Recent Labs    06/07/19 2226 06/07/19 2330  DDIMER 1.87*  --   FERRITIN  --  1,318*  LDH  --  245*  CRP  --  18.2*     Lab Results  Component Value Date   SARSCOV2NAA POSITIVE (A) 06/07/2019   [place] NEGATIVE 04/10/2019   [place] NOT DETECTED 01/24/2019   [place] NEGATIVE 01/21/2019    2-COPD with chronic Hypoxic Respiratory Failure.  -On 2-3 L chronic oxygen supplementation.  -continue with oxygen supplementation.  -Dexamethasone.  -Continue with Combivent.  Marland Kitchen  3-ESRD on HD;  Nephrology consulted.  Plan for HD today.   4-Thrombocytopenia;  Related to acute illness.  Follow trend.   DM insulin Dependent, poorly controlled.  A1c 7.9.  SSI.   Myoclonus;  He has chronic tremors, worse lately.  Hopefully improved with HD>  Hold Gabapentin.   HNT; Hold BP medications, SBP soft.      Estimated body mass index is 28.59 kg/m as calculated from the following:   Height as of 04/14/19: 5\' 11"  (1.803 m).   Weight as of 04/14/19: 93 kg.   DVT prophylaxis: Heparin  Code Status: full code Family Communication: will update Daughter  Disposition Plan: Remain in the hospital for treatment of PNA in setting of covid, worsening tremors,  Consultants:   Nephrology  Procedures:   none  Antimicrobials:    Subjective: He is alert, has involuntary tremors upper part of his body. Worse over last week. He is breathing, ok, mild dyspnea.   Objective: Vitals:   06/08/19 0515 06/08/19 0530 06/08/19 0900 06/08/19 0930  BP: (!) 108/55 (!) 130/99 121/62 (!) 126/58  Pulse: 73 84 73 74  Resp: (!) 25 (!) 26 (!) 21 (!) 28  Temp:      TempSrc:      SpO2: (!) 89% 92% (!) 87% Marland Kitchen)  85%   No intake or output data in the 24 hours ending 06/08/19 1309 There were no vitals filed for this visit.  Examination:  General exam: Appears calm and comfortable  Respiratory system: Clear to auscultation. Respiratory effort normal. Cardiovascular system: S1 & S2 heard, RRR. No JVD, murmurs, rubs, gallops or clicks. No pedal edema. Gastrointestinal system: Abdomen is nondistended, soft and  nontender. No organomegaly or masses felt. Normal bowel sounds heard. Central nervous system: Alert and oriented. Tremors.  Extremities: Symmetric 5 x 5 power. Skin: No rashes, lesions or ulcers    Data Reviewed: I have personally reviewed following labs and imaging studies  CBC: Recent Labs  Lab 06/07/19 2226  WBC 5.4  NEUTROABS 3.9  HGB 14.3  HCT 49.8  MCV 94.7  PLT 573*   Basic Metabolic Panel: Recent Labs  Lab 06/07/19 2330  NA 134*  K 4.8  CL 94*  CO2 16*  GLUCOSE 179*  BUN 88*  CREATININE 13.68*  CALCIUM 7.1*   GFR: CrCl cannot be calculated (Unknown ideal weight.). Liver Function Tests: Recent Labs  Lab 06/07/19 2330  AST 44*  ALT 44  ALKPHOS 102  BILITOT 0.4  PROT 6.9  ALBUMIN 2.8*   No results for input(s): LIPASE, AMYLASE in the last 168 hours. No results for input(s): AMMONIA in the last 168 hours. Coagulation Profile: No results for input(s): INR, PROTIME in the last 168 hours. Cardiac Enzymes: No results for input(s): CKTOTAL, CKMB, CKMBINDEX, TROPONINI in the last 168 hours. BNP (last 3 results) No results for input(s): PROBNP in the last 8760 hours. HbA1C: No results for input(s): HGBA1C in the last 72 hours. CBG: Recent Labs  Lab 06/08/19 0808 06/08/19 1228  GLUCAP 129* 199*   Lipid Profile: Recent Labs    06/07/19 2330  TRIG 560*   Thyroid Function Tests: No results for input(s): TSH, T4TOTAL, FREET4, T3FREE, THYROIDAB in the last 72 hours. Anemia Panel: Recent Labs    06/07/19 2330  FERRITIN 1,318*   Sepsis Labs: Recent Labs  Lab 06/07/19 2226 06/07/19 2330  PROCALCITON  --  2.85  LATICACIDVEN 1.7  --     Recent Results (from the past 240 hour(s))  Blood Culture (routine x 2)     Status: None (Preliminary result)   Collection Time: 06/07/19 10:26 PM   Specimen: BLOOD RIGHT HAND  Result Value Ref Range Status   Specimen Description BLOOD RIGHT HAND  Final   Special Requests   Final    BOTTLES DRAWN AEROBIC  AND ANAEROBIC Blood Culture results may not be optimal due to an inadequate volume of blood received in culture bottles Performed at [facility], [place] [address], [place], [place] [address]    Culture PENDING  Incomplete   Report Status PENDING  Incomplete  SARS CORONAVIRUS 2 (TAT 6-24 HRS) Nasopharyngeal Nasopharyngeal Swab     Status: Abnormal   Collection Time: 06/07/19 10:30 PM   Specimen: Nasopharyngeal Swab  Result Value Ref Range Status   SARS Coronavirus 2 POSITIVE (A) NEGATIVE Final    Comment: RESULT CALLED TO, READ BACK BY AND VERIFIED WITH: K. MOON,RN 0340 06/08/2019 T. TYSOR (NOTE) SARS-CoV-2 target nucleic acids are DETECTED. The SARS-CoV-2 RNA is generally detectable in upper and lower respiratory specimens during the acute phase of infection. Positive results are indicative of the presence of SARS-CoV-2 RNA. Clinical correlation with patient history and other diagnostic information is  necessary to determine patient infection status. Positive results do not rule out bacterial  infection or co-infection with other viruses.  The expected result is Negative. Fact Sheet for Patients: SugarRoll.be Fact Sheet for Healthcare Providers: https://www.woods-mathews.com/ This test is not yet approved or cleared by the [place] FDA and  has been authorized for detection and/or diagnosis of SARS-CoV-2 by FDA under an Emergency Use Authorization (EUA). This EUA will remain  in effect (meaning this test can be used) for the  duration of the COVID-19 declaration under Section 564(b)(1) of the Act, 21 U.S.C. section 360bbb-3(b)(1), unless the authorization is terminated or revoked sooner. Performed at [facility], [place] [address] [place], [place] [address]          Radiology Studies: DG Chest Port 1 View  Result Date: 06/07/2019 CLINICAL DATA:  Weakness.  Hypotension.  Dialysis patient. EXAM: PORTABLE CHEST 1 VIEW  COMPARISON:  04/23/2019 FINDINGS: Previously seen right internal jugular catheter has been removed. Mild cardiomegaly as seen previously. Abnormal interstitial pulmonary density probably indicating fluid overload/congestive heart failure. This is asymmetrically more pronounced on the left, and left lung pneumonia is not excluded. IMPRESSION: Probable fluid overload/interstitial edema. Findings are asymmetrically more pronounced on the left, raising the possibility pneumonia. Electronically Signed   By: Nelson Chimes M.D.   On: 06/07/2019 21:53        Scheduled Meds: . Chlorhexidine Gluconate Cloth  6 each Topical Q0600  . dexamethasone (DECADRON) injection  6 mg Intravenous Daily  . heparin  5,000 Units Subcutaneous Q8H  . insulin aspart  0-5 Units Subcutaneous QHS  . insulin aspart  0-9 Units Subcutaneous TID WC  . insulin aspart  2 Units Subcutaneous TID WC  . insulin glargine  8 Units Subcutaneous BID  . Ipratropium-Albuterol  1 puff Inhalation Q6H  . mometasone-formoterol  2 puff Inhalation BID  . sodium chloride flush  3 mL Intravenous Q12H   Continuous Infusions: . azithromycin Stopped (06/08/19 0515)  . cefTRIAXone (ROCEPHIN)  IV Stopped (06/08/19 0353)  . [START ON 06/09/2019] remdesivir 100 mg in NS 100 mL       LOS: 0 days    Time spent: 35 minutes.     Elmarie Shiley, MD [facility]   If 7PM-7AM, please contact night-coverage [email or web address] Password [email or web address] 06/08/2019, 1:09 PM

## 2019-06-08 NOTE — ED Triage Notes (Signed)
CBG 124 

## 2019-06-09 ENCOUNTER — Encounter (HOSPITAL_COMMUNITY): Payer: Self-pay | Admitting: Family Medicine

## 2019-06-09 ENCOUNTER — Inpatient Hospital Stay (HOSPITAL_COMMUNITY): Payer: Medicare Other

## 2019-06-09 DIAGNOSIS — J1282 Pneumonia due to coronavirus disease 2019: Secondary | ICD-10-CM | POA: Diagnosis present

## 2019-06-09 DIAGNOSIS — U071 COVID-19: Secondary | ICD-10-CM | POA: Diagnosis present

## 2019-06-09 LAB — COMPREHENSIVE METABOLIC PANEL
ALT: 39 U/L (ref 0–44)
AST: 47 U/L — ABNORMAL HIGH (ref 15–41)
Albumin: 2.7 g/dL — ABNORMAL LOW (ref 3.5–5.0)
Alkaline Phosphatase: 95 U/L (ref 38–126)
Anion gap: 31 — ABNORMAL HIGH (ref 5–15)
BUN: 122 mg/dL — ABNORMAL HIGH (ref 8–23)
CO2: 16 mmol/L — ABNORMAL LOW (ref 22–32)
Calcium: 6.9 mg/dL — ABNORMAL LOW (ref 8.9–10.3)
Chloride: 95 mmol/L — ABNORMAL LOW (ref 98–111)
Creatinine, Ser: 16.47 mg/dL — ABNORMAL HIGH (ref 0.61–1.24)
GFR calc Af Amer: 3 mL/min — ABNORMAL LOW (ref >60)
GFR calc non Af Amer: 2 mL/min — ABNORMAL LOW (ref >60)
Glucose, Bld: 107 mg/dL — ABNORMAL HIGH (ref 70–99)
Potassium: 5 mmol/L (ref 3.5–5.1)
Sodium: 142 mmol/L (ref 135–145)
Total Bilirubin: 0.4 mg/dL (ref 0.3–1.2)
Total Protein: 6.6 g/dL (ref 6.5–8.1)

## 2019-06-09 LAB — CBC WITH DIFFERENTIAL/PLATELET
Abs Immature Granulocytes: 0.06 10*3/uL (ref 0.00–0.07)
Basophils Absolute: 0 10*3/uL (ref 0.0–0.1)
Basophils Relative: 0 %
Eosinophils Absolute: 0 10*3/uL (ref 0.0–0.5)
Eosinophils Relative: 0 %
HCT: 39 % (ref 39.0–52.0)
Hemoglobin: 12.3 g/dL — ABNORMAL LOW (ref 13.0–17.0)
Immature Granulocytes: 1 %
Lymphocytes Relative: 24 %
Lymphs Abs: 1.2 10*3/uL (ref 0.7–4.0)
MCH: 27.6 pg (ref 26.0–34.0)
MCHC: 31.5 g/dL (ref 30.0–36.0)
MCV: 87.6 fL (ref 80.0–100.0)
Monocytes Absolute: 0.5 10*3/uL (ref 0.1–1.0)
Monocytes Relative: 10 %
Neutro Abs: 3.1 10*3/uL (ref 1.7–7.7)
Neutrophils Relative %: 65 %
Platelets: 147 10*3/uL — ABNORMAL LOW (ref 150–400)
RBC: 4.45 MIL/uL (ref 4.22–5.81)
RDW: 16.4 % — ABNORMAL HIGH (ref 11.5–15.5)
WBC: 4.9 10*3/uL (ref 4.0–10.5)
nRBC: 0 % (ref 0.0–0.2)

## 2019-06-09 LAB — C-REACTIVE PROTEIN: CRP: 14.8 mg/dL — ABNORMAL HIGH (ref <1.0)

## 2019-06-09 LAB — GLUCOSE, CAPILLARY
Glucose-Capillary: 140 mg/dL — ABNORMAL HIGH (ref 70–99)
Glucose-Capillary: 184 mg/dL — ABNORMAL HIGH (ref 70–99)

## 2019-06-09 LAB — CBG MONITORING, ED
Glucose-Capillary: 103 mg/dL — ABNORMAL HIGH (ref 70–99)
Glucose-Capillary: 106 mg/dL — ABNORMAL HIGH (ref 70–99)

## 2019-06-09 LAB — FERRITIN: Ferritin: 1476 ng/mL — ABNORMAL HIGH (ref 24–336)

## 2019-06-09 LAB — D-DIMER, QUANTITATIVE: D-Dimer, Quant: 1.89 ug/mL-FEU — ABNORMAL HIGH (ref 0.00–0.50)

## 2019-06-09 MED ORDER — LIDOCAINE HCL (PF) 1 % IJ SOLN: 5.0000 mL | INTRAMUSCULAR | Status: DC | PRN

## 2019-06-09 MED ORDER — LIDOCAINE-PRILOCAINE 2.5-2.5 % EX CREA: 1.0000 "application " | TOPICAL_CREAM | CUTANEOUS | Status: DC | PRN

## 2019-06-09 MED ORDER — HEPARIN SODIUM (PORCINE) 1000 UNIT/ML DIALYSIS: 1000.0000 [IU] | INTRAMUSCULAR | Status: DC | PRN

## 2019-06-09 MED ORDER — SODIUM CHLORIDE 0.9 % IV SOLN: 100.0000 mL | INTRAVENOUS | Status: DC | PRN

## 2019-06-09 MED ORDER — SODIUM BICARBONATE 650 MG PO TABS
1300.0000 mg | ORAL_TABLET | Freq: Two times a day (BID) | ORAL | Status: DC
  Administered 2019-06-09 – 2019-06-17 (×16): 1300 mg via ORAL
  Filled 2019-06-09 (×17): qty 2

## 2019-06-09 MED ORDER — HEPARIN SODIUM (PORCINE) 1000 UNIT/ML DIALYSIS: 20.0000 [IU]/kg | INTRAMUSCULAR | Status: DC | PRN

## 2019-06-09 MED ORDER — PENTAFLUOROPROP-TETRAFLUOROETH EX AERO: 1.0000 "application " | INHALATION_SPRAY | CUTANEOUS | Status: DC | PRN

## 2019-06-09 MED ORDER — ALTEPLASE 2 MG IJ SOLR: 2.0000 mg | Freq: Once | INTRAMUSCULAR | Status: DC | PRN

## 2019-06-09 NOTE — ED Notes (Signed)
Tele   Breakfast ordered  

## 2019-06-09 NOTE — ED Notes (Signed)
Per Hemodialysis, pt to go for dialysis treatment at 1200.

## 2019-06-09 NOTE — ED Notes (Signed)
Pt ate 2 bites of eggs and one bite of grits

## 2019-06-09 NOTE — Progress Notes (Signed)
PROGRESS NOTE    Thomas [name]  XMI:[id number] DOB: Jan 21, 1941 DOA: 06/07/2019 PCP: [facility]   Brief Narrative: 79 y.o. 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  Patient symptoms began on 06/03/2019 with loose stools and generalized weakness.  He also went on to develop increase in his chronic dyspnea and had a negative rapid Covid test on 06/05/2019.  He denies any abdominal pain or chest pain. Wife reports that his chronic jerking movements have worsened over the past week.   Evaluation in the ED patient was found to be hypoxic, oxygen sat in 90s on his usual 2 to 3 L of oxygen, chest x-ray consistent with fluid overload interstitial edema possible pneumonia.  Covid antigen test positive.   Assessment & Plan:   Principal Problem:   Pneumonia Active Problems:   Insulin-requiring or dependent type II diabetes mellitus ([place])   Diarrhea of presumed infectious origin   End stage renal disease ([place])   Sepsis ([place])   Thrombocytopenia ([place])   Chronic respiratory failure with hypoxia (HCC)   COPD (chronic obstructive pulmonary disease) (HCC)   HTN (hypertension)   COVID-19 virus infection   Pneumonia due to COVID-19 virus   1-Acute on chronic hypoxic respiratory failure, worsening secondary to COVID-19 pneumonia: -Patient presented with weakness, increased shortness of breath chest x-ray with possible pneumonia. -Procalcitonin elevated; started on ceftriaxone and azithromycin, continue. -Continue with Decadron and remdesivir. -Oxygen requirement increase to 14 L.  -No actemra due to elevated pro-calcitonin level.  COVID-19 Labs   Recent Labs    06/07/19 2226 06/07/19 2330 06/09/19 0804  DDIMER 1.87*  --  1.89*  FERRITIN  --  1,318* 1,476*  LDH  --  245*  --   CRP  --  18.2* 14.8*    Lab Results  Component Value Date   SARSCOV2NAA POSITIVE (A) 06/07/2019   [place] NEGATIVE 04/10/2019   [place] NOT DETECTED 01/24/2019   [place] NEGATIVE 01/21/2019    2-COPD with chronic Hypoxic Respiratory Failure.  -On 2-3 L chronic oxygen supplementation.  -Continue with oxygen supplementation.  -Continue with Dexamethasone.  -Continue with Combivent.  Marland Kitchen  3-ESRD on HD;  Nephrology consulted.  Needs HD>   4-Thrombocytopenia;  Related to acute illness.  Follow trend.   DM insulin Dependent, poorly controlled.  A1c 7.9.  SSI.   Myoclonus;  He has chronic tremors, worse lately.  Hopefully improved with HD>  Hold Gabapentin.   HNT; Hold BP medications, SBP soft.   Metabolic acidosis;  Start sodium bicarb. Hopefully improve with HD.    Estimated body mass index is 28.59 kg/m as calculated from the following:   Height as of 04/14/19: 5\' 11"  (1.803 m).   Weight as of 04/14/19: 93 kg.   DVT prophylaxis: Heparin  Code Status: full code Family Communication:  Daughter updated 1-04 Disposition Plan: Remain in the hospital for treatment of PNA in setting of covid, worsening tremors,  Consultants:   Nephrology  Procedures:   none  Antimicrobials:    Subjective: He is alert, report tremors have improved. Denies worsening dyspnea.  Asking for breakfast  Objective: Vitals:   06/09/19 0753 06/09/19 0755 06/09/19 0901 06/09/19 1112  BP:  (!) 148/97 93/61 (!) 113/50  Pulse:  64 65  60  Resp:  20 (!) 30 20  Temp:  97.6 F (36.4 C)  (!) 97 F (36.1 C)  TempSrc:  Oral    SpO2: 92% 92% 94% 94%    Intake/Output Summary (Last 24 hours) at 06/09/2019 1233 Last data filed at 06/09/2019 1149 Gross per 24 hour  Intake 240 ml  Output -  Net 240 ml   There were no vitals filed for this  visit.  Examination:  General exam; NAD Respiratory system: distant CTA Cardiovascular system: S 1, S 2 RRR Gastrointestinal system: BS present, soft, nt Central nervous system: alert, tremors Extremities: Symmetric pwoer Skin: no rashes    Data Reviewed: I have personally reviewed following labs and imaging studies  CBC: Recent Labs  Lab 06/07/19 2226 06/09/19 0804  WBC 5.4 4.9  NEUTROABS 3.9 3.1  HGB 14.3 12.3*  HCT 49.8 39.0  MCV 94.7 87.6  PLT 117* 299*   Basic Metabolic Panel: Recent Labs  Lab 06/07/19 2330 06/09/19 0804  NA 134* 142  K 4.8 5.0  CL 94* 95*  CO2 16* 16*  GLUCOSE 179* 107*  BUN 88* 122*  CREATININE 13.68* 16.47*  CALCIUM 7.1* 6.9*   GFR: CrCl cannot be calculated (Unknown ideal weight.). Liver Function Tests: Recent Labs  Lab 06/07/19 2330 06/09/19 0804  AST 44* 47*  ALT 44 39  ALKPHOS 102 95  BILITOT 0.4 0.4  PROT 6.9 6.6  ALBUMIN 2.8* 2.7*   No results for input(s): LIPASE, AMYLASE in the last 168 hours. No results for input(s): AMMONIA in the last 168 hours. Coagulation Profile: No results for input(s): INR, PROTIME in the last 168 hours. Cardiac Enzymes: No results for input(s): CKTOTAL, CKMB, CKMBINDEX, TROPONINI in the last 168 hours. BNP (last 3 results) No results for input(s): PROBNP in the last 8760 hours. HbA1C: No results for input(s): HGBA1C in the last 72 hours. CBG: Recent Labs  Lab 06/08/19 1228 06/08/19 1610 06/08/19 1908 06/09/19 0045 06/09/19 0744  GLUCAP 199* 144* 129* 106* 103*   Lipid Profile: Recent Labs    06/07/19 2330  TRIG 560*   Thyroid Function Tests: No results for input(s): TSH, T4TOTAL, FREET4, T3FREE, THYROIDAB in the last 72 hours. Anemia Panel: Recent Labs    06/07/19 2330 06/09/19 0804  FERRITIN 1,318* 1,476*   Sepsis Labs: Recent Labs  Lab 06/07/19 2226 06/07/19 2330  PROCALCITON  --  2.85  LATICACIDVEN 1.7  --     Recent Results (from the past 240 hour(s))   Blood Culture (routine x 2)     Status: None (Preliminary result)   Collection Time: 06/07/19 10:26 PM   Specimen: BLOOD RIGHT HAND  Result Value Ref Range Status   Specimen Description BLOOD RIGHT HAND  Final   Special Requests   Final    BOTTLES DRAWN AEROBIC AND ANAEROBIC Blood Culture results may not be optimal due to an inadequate volume of blood received in culture bottles   Culture   Final    NO GROWTH 2 DAYS Performed at [facility], [place] [address], [place], [place] [address]    Report Status PENDING  Incomplete  SARS CORONAVIRUS 2 (TAT 6-24 HRS) Nasopharyngeal Nasopharyngeal Swab     Status: Abnormal   Collection Time: 06/07/19 10:30 PM   Specimen: Nasopharyngeal Swab  Result Value Ref Range Status   SARS Coronavirus 2 POSITIVE (A) NEGATIVE Final    Comment: RESULT CALLED TO, READ BACK BY AND VERIFIED WITH: K. MOON,RN 3419 06/08/2019 T. TYSOR (  NOTE) SARS-CoV-2 target nucleic acids are DETECTED. The SARS-CoV-2 RNA is generally detectable in upper and lower respiratory specimens during the acute phase of infection. Positive results are indicative of the presence of SARS-CoV-2 RNA. Clinical correlation with patient history and other diagnostic information is  necessary to determine patient infection status. Positive results do not rule out bacterial infection or co-infection with other viruses.  The expected result is Negative. Fact Sheet for Patients: SugarRoll.be Fact Sheet for Healthcare Providers: https://www.woods-mathews.com/ This test is not yet approved or cleared by the [place] FDA and  has been authorized for detection and/or diagnosis of SARS-CoV-2 by FDA under an Emergency Use Authorization (EUA). This EUA will remain  in effect (meaning this test can be used) for the  duration of the COVID-19 declaration under Section 564(b)(1) of the Act, 21 U.S.C. section 360bbb-3(b)(1), unless the authorization is  terminated or revoked sooner. Performed at [facility], [place] [address], [place], [place] [address]   Blood Culture (routine x 2)     Status: None (Preliminary result)   Collection Time: 06/08/19 12:20 AM   Specimen: BLOOD  Result Value Ref Range Status   Specimen Description BLOOD RIGHT ANTECUBITAL  Final   Special Requests   Final    BOTTLES DRAWN AEROBIC AND ANAEROBIC Blood Culture results may not be optimal due to an inadequate volume of blood received in culture bottles   Culture   Final    NO GROWTH 1 DAY Performed at [facility], [place] [address], [place], [place] [address]    Report Status PENDING  Incomplete         Radiology Studies: DG Chest Port 1 View  Result Date: 06/07/2019 CLINICAL DATA:  Weakness.  Hypotension.  Dialysis patient. EXAM: PORTABLE CHEST 1 VIEW COMPARISON:  04/23/2019 FINDINGS: Previously seen right internal jugular catheter has been removed. Mild cardiomegaly as seen previously. Abnormal interstitial pulmonary density probably indicating fluid overload/congestive heart failure. This is asymmetrically more pronounced on the left, and left lung pneumonia is not excluded. IMPRESSION: Probable fluid overload/interstitial edema. Findings are asymmetrically more pronounced on the left, raising the possibility pneumonia. Electronically Signed   By: Nelson Chimes M.D.   On: 06/07/2019 21:53        Scheduled Meds: . Chlorhexidine Gluconate Cloth  6 each Topical Q0600  . dexamethasone (DECADRON) injection  6 mg Intravenous Daily  . heparin  5,000 Units Subcutaneous Q8H  . insulin aspart  0-5 Units Subcutaneous QHS  . insulin aspart  0-9 Units Subcutaneous TID WC  . insulin aspart  2 Units Subcutaneous TID WC  . insulin glargine  8 Units Subcutaneous BID  . Ipratropium-Albuterol  1 puff Inhalation Q6H  . isosorbide mononitrate  30 mg Oral Daily  . magnesium oxide  400 mg Oral QHS  . mometasone-formoterol  2 puff Inhalation BID  . sodium  chloride flush  3 mL Intravenous Q12H  . vitamin B-12  1,000 mcg Oral QPM   Continuous Infusions: . azithromycin Stopped (06/09/19 0439)  . cefTRIAXone (ROCEPHIN)  IV Stopped (06/09/19 0253)  . remdesivir 100 mg in NS 100 mL 100 mg (06/09/19 1149)     LOS: 1 day    Time spent: 35 minutes.     Elmarie Shiley, MD [facility]   If 7PM-7AM, please contact night-coverage [email or web address] Password [facility] 06/09/2019, 12:33 PM

## 2019-06-09 NOTE — Progress Notes (Signed)
Patient ID: Thomas [name], male   DOB: [birth year], 79 y.o.   MRN: [id number] S: Pt resting comfortably but is on 15 liters high flow oxygen O:BP (!) 113/50 (BP Location: Right Arm)   Pulse 60   Temp (!) 97 F (36.1 C)   Resp 20   SpO2 94%   Intake/Output Summary (Last 24 hours) at 06/09/2019 1314 Last data filed at 06/09/2019 1149 Gross per 24 hour  Intake 240 ml  Output --  Net 240 ml   Intake/Output: No intake/output data recorded.  Intake/Output this shift:  Total I/O In: 240 [P.O.:240] Out: -  Weight change:  Gen: NAD lying in bed Physical exam: unable to complete due to COVID + status.  In order to preserve PPE equipment and to minimize exposure to providers.  Notes from other caregivers reviewed   Recent Labs  Lab 06/07/19 2330 06/09/19 0804  NA 134* 142  K 4.8 5.0  CL 94* 95*  CO2 16* 16*  GLUCOSE 179* 107*  BUN 88* 122*  CREATININE 13.68* 16.47*  ALBUMIN 2.8* 2.7*  CALCIUM 7.1* 6.9*  AST 44* 47*  ALT 44 39   Liver Function Tests: Recent Labs  Lab 06/07/19 2330 06/09/19 0804  AST 44* 47*  ALT 44 39  ALKPHOS 102 95  BILITOT 0.4 0.4  PROT 6.9 6.6  ALBUMIN 2.8* 2.7*   No results for input(s): LIPASE, AMYLASE in the last 168 hours. No results for input(s): AMMONIA in the last 168 hours. CBC: Recent Labs  Lab 06/07/19 2226 06/09/19 0804  WBC 5.4 4.9  NEUTROABS 3.9 3.1  HGB 14.3 12.3*  HCT 49.8 39.0  MCV 94.7 87.6  PLT 117* 147*   Cardiac Enzymes: No results for input(s): CKTOTAL, CKMB, CKMBINDEX, TROPONINI in the last 168 hours. CBG: Recent Labs  Lab 06/08/19 1228 06/08/19 1610 06/08/19 1908 06/09/19 0045 06/09/19 0744  GLUCAP 199* 144* 129* 106* 103*    Iron Studies:  Recent Labs    06/09/19 0804  FERRITIN 1,476*   Studies/Results: DG Chest Port 1 View  Result Date: 06/07/2019 CLINICAL DATA:  Weakness.  Hypotension.  Dialysis patient. EXAM: PORTABLE CHEST 1 VIEW COMPARISON:  04/23/2019 FINDINGS: Previously seen right internal  jugular catheter has been removed. Mild cardiomegaly as seen previously. Abnormal interstitial pulmonary density probably indicating fluid overload/congestive heart failure. This is asymmetrically more pronounced on the left, and left lung pneumonia is not excluded. IMPRESSION: Probable fluid overload/interstitial edema. Findings are asymmetrically more pronounced on the left, raising the possibility pneumonia. Electronically Signed   By: Nelson Chimes M.D.   On: 06/07/2019 21:53   . Chlorhexidine Gluconate Cloth  6 each Topical Q0600  . dexamethasone (DECADRON) injection  6 mg Intravenous Daily  . heparin  5,000 Units Subcutaneous Q8H  . insulin aspart  0-5 Units Subcutaneous QHS  . insulin aspart  0-9 Units Subcutaneous TID WC  . insulin aspart  2 Units Subcutaneous TID WC  . insulin glargine  8 Units Subcutaneous BID  . Ipratropium-Albuterol  1 puff Inhalation Q6H  . isosorbide mononitrate  30 mg Oral Daily  . magnesium oxide  400 mg Oral QHS  . mometasone-formoterol  2 puff Inhalation BID  . sodium bicarbonate  1,300 mg Oral BID  . sodium chloride flush  3 mL Intravenous Q12H  . vitamin B-12  1,000 mcg Oral QPM    BMET    Component Value Date/Time   NA 142 06/09/2019 0804   NA 137 06/30/2012 1305   K  5.0 06/09/2019 0804   K 4.1 06/30/2012 1305   CL 95 (L) 06/09/2019 0804   CL 106 06/30/2012 1305   CO2 16 (L) 06/09/2019 0804   CO2 24 06/30/2012 1305   GLUCOSE 107 (H) 06/09/2019 0804   BUN 122 (H) 06/09/2019 0804   BUN 62 (A) 06/30/2012 1305   CREATININE 16.47 (H) 06/09/2019 0804   CREATININE 4.09 (H) 07/23/2012 0941   CALCIUM 6.9 (L) 06/09/2019 0804   CALCIUM 8.1 06/30/2012 1305   GFRNONAA 2 (L) 06/09/2019 0804   GFRAA 3 (L) 06/09/2019 0804   CBC    Component Value Date/Time   WBC 4.9 06/09/2019 0804   RBC 4.45 06/09/2019 0804   HGB 12.3 (L) 06/09/2019 0804   HCT 39.0 06/09/2019 0804   HCT 32 06/29/2012 1308   PLT 147 (L) 06/09/2019 0804   MCV 87.6 06/09/2019 0804    MCH 27.6 06/09/2019 0804   MCHC 31.5 06/09/2019 0804   RDW 16.4 (H) 06/09/2019 0804   LYMPHSABS 1.2 06/09/2019 0804   MONOABS 0.5 06/09/2019 0804   EOSABS 0.0 06/09/2019 0804   BASOSABS 0.0 06/09/2019 0804    Dialysis Access: Left AVF  Dialysis Orders: Center: [facility]  on MWF . EDW 94.5kg HD Bath 1K/2.5Ca  Time 4 hours Heparin 1600units IVP bolus then 1400 units/hr. Access LAVF BFR 350 DFR 600    Hectoral 4.5 mcg IV/HD Venofer  50 mg weekly    Assessment/Plan: 1.  Acute hypoxic respiratory failure due to Covid-19 pneumonia- started on rocephin, azithromycin in ED as well as decadron and remdesivir. 1. Requiring 15 liters high flow oxygen 2.  ESRD -  Plan for HD today in his room as he is too unstable to transport off the floor for HD.  Will UF as tolerated to help with his high oxygen demands. 3.  Hypertension/volume  - BP on low side.  Follow with UF 4.  Anemia  - No esa due to Hgb >12 5.  Metabolic bone disease -  Cont with home meds 6.  Nutrition -  Renal diet. 7. DM- per primary svc. 8. COPD- was on home o2 prior to admission.  Donetta Potts, MD [employer] [phone]

## 2019-06-09 NOTE — ED Notes (Addendum)
Paged [facility] concerning admission order and pt. Dialysis MWF.   Per MD, MD to update admission order and hemodialysis to be completed in house

## 2019-06-09 NOTE — ED Notes (Signed)
Update given to pts daughter Tamra: [phone]

## 2019-06-10 ENCOUNTER — Inpatient Hospital Stay (HOSPITAL_COMMUNITY): Payer: Medicare Other

## 2019-06-10 LAB — COMPREHENSIVE METABOLIC PANEL
ALT: 38 U/L (ref 0–44)
AST: 40 U/L (ref 15–41)
Albumin: 2.5 g/dL — ABNORMAL LOW (ref 3.5–5.0)
Alkaline Phosphatase: 98 U/L (ref 38–126)
Anion gap: 27 — ABNORMAL HIGH (ref 5–15)
BUN: 105 mg/dL — ABNORMAL HIGH (ref 8–23)
CO2: 17 mmol/L — ABNORMAL LOW (ref 22–32)
Calcium: 7.1 mg/dL — ABNORMAL LOW (ref 8.9–10.3)
Chloride: 96 mmol/L — ABNORMAL LOW (ref 98–111)
Creatinine, Ser: 14.07 mg/dL — ABNORMAL HIGH (ref 0.61–1.24)
GFR calc Af Amer: 3 mL/min — ABNORMAL LOW (ref >60)
GFR calc non Af Amer: 3 mL/min — ABNORMAL LOW (ref >60)
Glucose, Bld: 333 mg/dL — ABNORMAL HIGH (ref 70–99)
Potassium: 4.7 mmol/L (ref 3.5–5.1)
Sodium: 140 mmol/L (ref 135–145)
Total Bilirubin: 0.5 mg/dL (ref 0.3–1.2)
Total Protein: 6.4 g/dL — ABNORMAL LOW (ref 6.5–8.1)

## 2019-06-10 LAB — CBC WITH DIFFERENTIAL/PLATELET
Abs Immature Granulocytes: 0.05 10*3/uL (ref 0.00–0.07)
Basophils Absolute: 0 10*3/uL (ref 0.0–0.1)
Basophils Relative: 0 %
Eosinophils Absolute: 0 10*3/uL (ref 0.0–0.5)
Eosinophils Relative: 0 %
HCT: 36.9 % — ABNORMAL LOW (ref 39.0–52.0)
Hemoglobin: 11.8 g/dL — ABNORMAL LOW (ref 13.0–17.0)
Immature Granulocytes: 2 %
Lymphocytes Relative: 15 %
Lymphs Abs: 0.4 10*3/uL — ABNORMAL LOW (ref 0.7–4.0)
MCH: 27.2 pg (ref 26.0–34.0)
MCHC: 32 g/dL (ref 30.0–36.0)
MCV: 85 fL (ref 80.0–100.0)
Monocytes Absolute: 0.3 10*3/uL (ref 0.1–1.0)
Monocytes Relative: 10 %
Neutro Abs: 1.8 10*3/uL (ref 1.7–7.7)
Neutrophils Relative %: 73 %
Platelets: 166 10*3/uL (ref 150–400)
RBC: 4.34 MIL/uL (ref 4.22–5.81)
RDW: 16.3 % — ABNORMAL HIGH (ref 11.5–15.5)
WBC: 2.5 10*3/uL — ABNORMAL LOW (ref 4.0–10.5)
nRBC: 0 % (ref 0.0–0.2)

## 2019-06-10 LAB — GLUCOSE, CAPILLARY
Glucose-Capillary: 80 mg/dL (ref 70–99)
Glucose-Capillary: 193 mg/dL — ABNORMAL HIGH (ref 70–99)
Glucose-Capillary: 131 mg/dL — ABNORMAL HIGH (ref 70–99)
Glucose-Capillary: 182 mg/dL — ABNORMAL HIGH (ref 70–99)
Glucose-Capillary: 211 mg/dL — ABNORMAL HIGH (ref 70–99)

## 2019-06-10 LAB — C-REACTIVE PROTEIN: CRP: 11.3 mg/dL — ABNORMAL HIGH (ref <1.0)

## 2019-06-10 LAB — D-DIMER, QUANTITATIVE: D-Dimer, Quant: 1.7 ug/mL-FEU — ABNORMAL HIGH (ref 0.00–0.50)

## 2019-06-10 LAB — FERRITIN: Ferritin: 1157 ng/mL — ABNORMAL HIGH (ref 24–336)

## 2019-06-10 NOTE — Progress Notes (Signed)
Patient ID: Thomas [name], male   DOB: 05/25/1941, 79 y.o.   MRN: [id number] S: No overnight events by chart review Tolerated bedside HD yesterday with 1L UF.   O:BP 131/66 (BP Location: Right Arm)   Pulse (!) 59   Temp (!) 97.5 F (36.4 C)   Resp (!) 22   Wt 93 kg   SpO2 91%   BMI 28.60 kg/m   Intake/Output Summary (Last 24 hours) at 06/10/2019 0835 Last data filed at 06/09/2019 1630 Gross per 24 hour  Intake 1003.64 ml  Output 1000 ml  Net 3.64 ml   Intake/Output: I/O last 3 completed shifts: In: 1003.6 [P.O.:240; IV Piggyback:763.6] Out: 1000 [Other:1000]  Intake/Output this shift:  No intake/output data recorded. Weight change:  Gen: NAD lying in bed Physical exam: unable to complete due to COVID + status.  In order to preserve PPE equipment and to minimize exposure to providers.  Notes from other caregivers reviewed   Recent Labs  Lab 06/07/19 2330 06/09/19 0804 06/10/19 0400  NA 134* 142 140  K 4.8 5.0 4.7  CL 94* 95* 96*  CO2 16* 16* 17*  GLUCOSE 179* 107* 333*  BUN 88* 122* 105*  CREATININE 13.68* 16.47* 14.07*  ALBUMIN 2.8* 2.7* 2.5*  CALCIUM 7.1* 6.9* 7.1*  AST 44* 47* 40  ALT 44 39 38   Liver Function Tests: Recent Labs  Lab 06/07/19 2330 06/09/19 0804 06/10/19 0400  AST 44* 47* 40  ALT 44 39 38  ALKPHOS 102 95 98  BILITOT 0.4 0.4 0.5  PROT 6.9 6.6 6.4*  ALBUMIN 2.8* 2.7* 2.5*   No results for input(s): LIPASE, AMYLASE in the last 168 hours. No results for input(s): AMMONIA in the last 168 hours. CBC: Recent Labs  Lab 06/07/19 2226 06/09/19 0804 06/10/19 0400  WBC 5.4 4.9 2.5*  NEUTROABS 3.9 3.1 1.8  HGB 14.3 12.3* 11.8*  HCT 49.8 39.0 36.9*  MCV 94.7 87.6 85.0  PLT 117* 147* 166   Cardiac Enzymes: No results for input(s): CKTOTAL, CKMB, CKMBINDEX, TROPONINI in the last 168 hours. CBG: Recent Labs  Lab 06/09/19 0045 06/09/19 0744 06/09/19 1629 06/09/19 2022 06/10/19 0821  GLUCAP 106* 103* 140* 184* 193*    Iron Studies:   Recent Labs    06/10/19 0400  FERRITIN 1,157*   Studies/Results: No results found. . Chlorhexidine Gluconate Cloth  6 each Topical Q0600  . dexamethasone (DECADRON) injection  6 mg Intravenous Daily  . insulin aspart  0-5 Units Subcutaneous QHS  . insulin aspart  0-9 Units Subcutaneous TID WC  . insulin aspart  2 Units Subcutaneous TID WC  . insulin glargine  8 Units Subcutaneous BID  . Ipratropium-Albuterol  1 puff Inhalation Q6H  . isosorbide mononitrate  30 mg Oral Daily  . magnesium oxide  400 mg Oral QHS  . mometasone-formoterol  2 puff Inhalation BID  . sodium bicarbonate  1,300 mg Oral BID  . sodium chloride flush  3 mL Intravenous Q12H  . vitamin B-12  1,000 mcg Oral QPM    BMET    Component Value Date/Time   NA 140 06/10/2019 0400   NA 137 06/30/2012 1305   K 4.7 06/10/2019 0400   K 4.1 06/30/2012 1305   CL 96 (L) 06/10/2019 0400   CL 106 06/30/2012 1305   CO2 17 (L) 06/10/2019 0400   CO2 24 06/30/2012 1305   GLUCOSE 333 (H) 06/10/2019 0400   BUN 105 (H) 06/10/2019 0400   BUN 62 (A)  06/30/2012 1305   CREATININE 14.07 (H) 06/10/2019 0400   CREATININE 4.09 (H) 07/23/2012 0941   CALCIUM 7.1 (L) 06/10/2019 0400   CALCIUM 8.1 06/30/2012 1305   GFRNONAA 3 (L) 06/10/2019 0400   GFRAA 3 (L) 06/10/2019 0400   CBC    Component Value Date/Time   WBC 2.5 (L) 06/10/2019 0400   RBC 4.34 06/10/2019 0400   HGB 11.8 (L) 06/10/2019 0400   HCT 36.9 (L) 06/10/2019 0400   HCT 32 06/29/2012 1308   PLT 166 06/10/2019 0400   MCV 85.0 06/10/2019 0400   MCH 27.2 06/10/2019 0400   MCHC 32.0 06/10/2019 0400   RDW 16.3 (H) 06/10/2019 0400   LYMPHSABS 0.4 (L) 06/10/2019 0400   MONOABS 0.3 06/10/2019 0400   EOSABS 0.0 06/10/2019 0400   BASOSABS 0.0 06/10/2019 0400    Dialysis Access: Left AVF  Dialysis Orders: Center: [facility]  on MWF . EDW 94.5kg HD Bath 1K/2.5Ca  Time 4 hours Heparin 1600units IVP bolus then 1400 units/hr. Access LAVF BFR 350 DFR 600    Hectoral  4.5 mcg IV/HD Venofer  50 mg weekly    Assessment/Plan: 1.  Acute hypoxic respiratory failure due to Covid-19 pneumonia- started on rocephin, azithromycin in ED as well as decadron and remdesivir. 1. Requiring 15 liters high flow oxygen 2.  ESRD -  Plan for HD tomorrow in his room if he remains on 15L --> if oxygenation improved will travel for HD.  Will UF as tolerated to help with his high oxygen demands. 3.  Hypertension/volume  - BP on low side.  Follow with UF 4.  Anemia  - No esa due to Hgb >12 5.  Metabolic bone disease -  Cont with home meds 6.  Nutrition -  Renal diet. 7. DM- per primary svc. 8. COPD- was on home o2 prior to admission.

## 2019-06-10 NOTE — Evaluation (Signed)
Physical Therapy Evaluation Patient Details Name: Thomas [name] MRN: [id number] DOB: [birth year] Today's Date: 06/10/2019   History of Present Illness  79 yo male admitted with COVID + acute hypoxic respiratory failure, hypertension, anemia and metabolic bone disease PMH ESRD on HD MWF COPD 3L at home, CAD, Neuropathy, nose cancer with nose amputation DM   Clinical Impression  Pt admitted with above diagnosis. Pt currently with functional limitations due to the deficits listed below (see PT Problem List). Pt will benefit from skilled PT to increase their independence and safety with mobility to allow discharge to the venue listed below.  Pt moved fairly well with bed mobility and transfers of MIN A of 2 for safety.  He was fatigued and weak and unable to ambulate more than a few side steps and march-in-place. When he had his mask on his o2 >90%, but it dropped to 80% when it was off and he was talking.  He required re-direction at time to stay focused on the task at hand. Recommend 24/7 A at home and sounds like he is working on this with his daughter and son-in-law. Recommend HHPT.     Follow Up Recommendations Home health PT;Supervision/Assistance - 24 hour    Equipment Recommendations  None recommended by PT    Recommendations for Other Services       Precautions / Restrictions Precautions Precautions: Fall Restrictions Weight Bearing Restrictions: No      Mobility  Bed Mobility Overal bed mobility: Needs Assistance Bed Mobility: Supine to Sit     Supine to sit: Min assist;+2 for physical assistance;+2 for safety/equipment     General bed mobility comments: MIN A of 2 to get legs off bed and trunk upright and for lines.  to return supine he moved quickly getting legs back up on the bed with cues for technique for upper body.  Transfers Overall transfer level: Needs assistance Equipment used: Rolling walker (2 wheeled) Transfers: Sit to/from Stand Sit to Stand: Min  assist;+2 physical assistance         General transfer comment: MIN A of 2 to power up with cues for proper hand placement and to not pull on RW.  Ambulation/Gait Ambulation/Gait assistance: Min assist;+2 safety/equipment Gait Distance (Feet): 2 Feet Assistive device: Rolling walker (2 wheeled) Gait Pattern/deviations: Step-to pattern     General Gait Details: Pt marched in place at [facility] with noticeable jerking at times. Side stepping to [facility] with RW and o2 via mask with o2 > 90%.  Stairs            Wheelchair Mobility    Modified Rankin (Stroke Patients Only)       Balance Overall balance assessment: Needs assistance Sitting-balance support: Bilateral upper extremity supported Sitting balance-Leahy Scale: Good Sitting balance - Comments: occasional tremor/jerking type movements, but not to the point of affecting his balance.   Standing balance support: Bilateral upper extremity supported Standing balance-Leahy Scale: Poor Standing balance comment: requires UE support                             Pertinent Vitals/Pain Pain Assessment: No/denies pain    Home Living Family/patient expects to be discharged to:: Private residence Living Arrangements: Children;Spouse/significant other Available Help at Discharge: Family;Available PRN/intermittently Type of Home: House Home Access: Ramped entrance     Home Layout: One level Home Equipment: Walker - 2 wheels;Wheelchair - manual;Shower seat - built in      Prior  Function Level of Independence: Independent with assistive device(s)         Comments: reports using RW for mobility, chronic O2 (at night)      Hand Dominance   Dominant Hand: Right    Extremity/Trunk Assessment   Upper Extremity Assessment Upper Extremity Assessment: Defer to OT evaluation    Lower Extremity Assessment Lower Extremity Assessment: Generalized weakness;Overall WFL for tasks assessed       Communication       Cognition Arousal/Alertness: Awake/alert Behavior During Therapy: WFL for tasks assessed/performed Overall Cognitive Status: Within Functional Limits for tasks assessed                                 General Comments: very talkative and needs re-direction at time      General Comments General comments (skin integrity, edema, etc.): Pt with nose amputation and has mask for o2. o2 > 90% when mask on. When he took it off at beginning of session and was talking dropped to 80%.    Exercises     Assessment/Plan    PT Assessment Patient needs continued PT services  PT Problem List Decreased strength;Decreased activity tolerance;Decreased balance;Decreased mobility;Decreased knowledge of use of DME;Decreased safety awareness       PT Treatment Interventions DME instruction;Gait training;Functional mobility training;Therapeutic exercise;Therapeutic activities    PT Goals (Current goals can be found in the Care Plan section)  Acute Rehab PT Goals Patient Stated Goal: go home PT Goal Formulation: With patient Time For Goal Achievement: 06/24/19 Potential to Achieve Goals: Good    Frequency Min 3X/week   Barriers to discharge        Co-evaluation PT/OT/SLP Co-Evaluation/Treatment: Yes Reason for Co-Treatment: For patient/therapist safety PT goals addressed during session: Mobility/safety with mobility;Balance;Proper use of DME         AM-PAC PT "6 Clicks" Mobility  Outcome Measure Help needed turning from your back to your side while in a flat bed without using bedrails?: A Little Help needed moving from lying on your back to sitting on the side of a flat bed without using bedrails?: A Little Help needed moving to and from a bed to a chair (including a wheelchair)?: A Little Help needed standing up from a chair using your arms (e.g., wheelchair or bedside chair)?: A Little Help needed to walk in hospital room?: A Little Help needed climbing 3-5 steps with a  railing? : A Lot 6 Click Score: 17    End of Session Equipment Utilized During Treatment: Gait belt;Oxygen Activity Tolerance: Patient limited by fatigue Patient left: in bed;with call bell/phone within reach;with bed alarm set Nurse Communication: Mobility status PT Visit Diagnosis: Unsteadiness on feet (R26.81);Other abnormalities of gait and mobility (R26.89);Muscle weakness (generalized) (M62.81)    Time: 1410-1440 PT Time Calculation (min) (ACUTE ONLY): 30 min   Charges:   PT Evaluation $PT Eval Moderate Complexity: 1 Mod          Erendida Wrenn L. Tamala Julian, [place] Pager [phone] 06/10/2019   Galen Manila 06/10/2019, 3:05 PM

## 2019-06-10 NOTE — Evaluation (Signed)
Occupational Therapy Evaluation Patient Details Name: Thomas [name] MRN: [id number] DOB: [birth year] Today's Date: 06/10/2019    History of Present Illness 79 yo male admitted with COVID + acute hypoxic respiratory failure, hypertension, anemia and metabolic bone disease PMH ESRD on HD MWF COPD 3L at home, CAD, Neuropathy, nose cancer with nose amputation DM    Clinical Impression   This 79 y/o male presents with the above. PTA pt reports use of RW for functional mobility, reports completing ADL without assist. Pt presenting overall with generalized weakness, decreased activity tolerance, and poor standing balance impacting his functional performance. Pt currently requiring minA+2 for functional transfers using RW, tolerating marching in place and few side steps along EOB prior to needing seated rest break due to fatigue. He currently requires overall modA for LB ADL, minguard assist for seated UB ADL. Pt with nose amputation and uses face mask for O2 needs (reports wears only at night), pt on 15L O2 this session with SpO2 >90% when mask donned, pt with mask off initially upon arrival to room with O2 sats down to 80% while seated EOB and  talking with therapists. Pt will benefit from continued acute OT services, difficult to confirm that pt has full 24hr supervision/assist as pt very tangential with conversation/responses to questions. If pt has 24hr assist at home suspect will likely be able to return home with [facility] services, if 24hr unavailable, may need to consider SNF. Will follow.     Follow Up Recommendations  Home health OT;Supervision/Assistance - 24 hour(if 24hr unavailable may need SNF)    Equipment Recommendations  3 in 1 bedside commode           Precautions / Restrictions Precautions Precautions: Fall Restrictions Weight Bearing Restrictions: No      Mobility Bed Mobility Overal bed mobility: Needs Assistance Bed Mobility: Supine to Sit;Sit to Supine     Supine to sit: Min  assist;+2 for physical assistance;+2 for safety/equipment Sit to supine: Min guard   General bed mobility comments: MIN A of 2 to get legs off bed and trunk upright and for lines.  to return supine he moved quickly getting legs back up on the bed with cues for technique for upper body.  Transfers Overall transfer level: Needs assistance Equipment used: Rolling walker (2 wheeled) Transfers: Sit to/from Stand Sit to Stand: Min assist;+2 physical assistance         General transfer comment: MIN A of 2 to power up with cues for proper hand placement and to not pull on RW.    Balance Overall balance assessment: Needs assistance Sitting-balance support: Bilateral upper extremity supported Sitting balance-Leahy Scale: Good Sitting balance - Comments: occasional tremor/jerking type movements, but not to the point of affecting his balance.   Standing balance support: Bilateral upper extremity supported Standing balance-Leahy Scale: Poor Standing balance comment: requires UE support                           ADL either performed or assessed with clinical judgement   ADL Overall ADL's : Needs assistance/impaired Eating/Feeding: Set up;Sitting;Bed level Eating/Feeding Details (indicate cue type and reason): setup with lunch tray end of session Grooming: Set up;Sitting   Upper Body Bathing: Min guard;Sitting   Lower Body Bathing: Moderate assistance;Sit to/from stand   Upper Body Dressing : Min guard;Sitting   Lower Body Dressing: Moderate assistance;Sit to/from stand Lower Body Dressing Details (indicate cue type and reason): pt adjusting socks seated  EOB; minA+2 for sit<>stand     Toileting- Clothing Manipulation and Hygiene: Moderate assistance;Maximal assistance;Sit to/from stand       Functional mobility during ADLs: Minimal assistance;+2 for physical assistance;+2 for safety/equipment;Rolling walker General ADL Comments: pt with generalized weakness and decreased  activity tolerance     Vision Baseline Vision/History: Wears glasses Wears Glasses: At all times       Perception     Praxis      Pertinent Vitals/Pain Pain Assessment: No/denies pain     Hand Dominance Right   Extremity/Trunk Assessment Upper Extremity Assessment Upper Extremity Assessment: Generalized weakness(baseline jerking motions/tremors, fluctuates)   Lower Extremity Assessment Lower Extremity Assessment: Defer to PT evaluation       Communication Communication Communication: No difficulties   Cognition Arousal/Alertness: Awake/alert Behavior During Therapy: WFL for tasks assessed/performed Overall Cognitive Status: Within Functional Limits for tasks assessed                                 General Comments: very talkative and often needs re-direction to attend to task at hand   General Comments  Pt with nose amputation and has face mask for O2 (15L today). o2 > 90% when mask on. When he took it off at beginning of session and was talking dropped to 80%.    Exercises     Shoulder Instructions      Home Living Family/patient expects to be discharged to:: Private residence Living Arrangements: Children;Spouse/significant other Available Help at Discharge: Family;Available PRN/intermittently Type of Home: House Home Access: Ramped entrance     Home Layout: One level     Bathroom Shower/Tub: Occupational psychologist: Standard     Home Equipment: Environmental consultant - 2 wheels;Wheelchair - Brewing technologist - built in          Prior Functioning/Environment Level of Independence: Independent with assistive device(s)        Comments: reports using RW for mobility, chronic O2 (at night)         OT Problem List: Decreased strength;Decreased range of motion;Decreased activity tolerance;Impaired balance (sitting and/or standing);Decreased safety awareness;Decreased knowledge of use of DME or AE;Cardiopulmonary status limiting  activity      OT Treatment/Interventions: Self-care/ADL training;Therapeutic exercise;Energy conservation;DME and/or AE instruction;Therapeutic activities;Patient/family education;Balance training    OT Goals(Current goals can be found in the care plan section) Acute Rehab OT Goals Patient Stated Goal: go home OT Goal Formulation: With patient Time For Goal Achievement: 06/24/19 Potential to Achieve Goals: Good  OT Frequency: Min 2X/week   Barriers to D/C:            Co-evaluation PT/OT/SLP Co-Evaluation/Treatment: Yes Reason for Co-Treatment: Complexity of the patient's impairments (multi-system involvement);For patient/therapist safety PT goals addressed during session: Mobility/safety with mobility;Balance;Proper use of DME OT goals addressed during session: ADL's and self-care      AM-PAC OT "6 Clicks" Daily Activity     Outcome Measure Help from another person eating meals?: A Little Help from another person taking care of personal grooming?: A Little Help from another person toileting, which includes using toliet, bedpan, or urinal?: A Lot Help from another person bathing (including washing, rinsing, drying)?: A Lot Help from another person to put on and taking off regular upper body clothing?: A Little Help from another person to put on and taking off regular lower body clothing?: A Lot 6 Click Score: 15   End of Session Equipment Utilized  During Treatment: Gait belt;Rolling walker;Oxygen Nurse Communication: Mobility status  Activity Tolerance: Patient tolerated treatment well Patient left: in bed;with call bell/phone within reach;with bed alarm set  OT Visit Diagnosis: Unsteadiness on feet (R26.81);Muscle weakness (generalized) (M62.81)                Time: 1410-1440 OT Time Calculation (min): 30 min Charges:  OT General Charges $OT Visit: 1 Visit OT Evaluation $OT Eval Moderate Complexity: 1 Mod  Lou Cal, OT [employer] Pager [phone] Office [phone]   Raymondo Band 06/10/2019, 5:26 PM

## 2019-06-10 NOTE — Progress Notes (Signed)
PROGRESS NOTE    Thomas [name]  WIO:[id number] DOB: 11-Jul-1940 DOA: 06/07/2019 PCP: [facility], [facility]   Brief Narrative: 79 y.o. 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  Patient symptoms began on 06/03/2019 with loose stools and generalized weakness.  He also went on to develop increase in his chronic dyspnea and had a negative rapid Covid test on 06/05/2019.  He denies any abdominal pain or chest pain. Wife reports that his chronic jerking movements have worsened over the past week.   Evaluation in the ED patient was found to be hypoxic, oxygen sat in 90s on his usual 2 to 3 L of oxygen, chest x-ray consistent with fluid overload interstitial edema possible pneumonia.  Covid antigen test positive.   Assessment & Plan:   Principal Problem:   Pneumonia Active Problems:   Insulin-requiring or dependent type II diabetes mellitus ([place])   Diarrhea of presumed infectious origin   End stage renal disease ([place])   Sepsis ([place])   Thrombocytopenia ([place])   Chronic respiratory failure with hypoxia ([facility])   COPD (chronic obstructive pulmonary disease) (H[facility])   HTN (hypertension)   COVID-19 virus infection   Pneumonia due to COVID-19 virus   1-Acute on chronic hypoxic respiratory failure, worsening secondary to COVID-19 pneumonia: -Patient presented with weakness, increased shortness of breath chest x-ray with possible pneumonia. -Procalcitonin elevated; started on ceftriaxone and azithromycin, which will be continued -Continue with Decadron and remdesivir. -Oxygen requirement increased 15 L yesterday, working on weaning down this afternoon, will  continue to monitor closely    COVID-19 Labs  Recent Labs    06/07/19 2226 06/07/19 2330 06/09/19 0804 06/10/19 0400  DDIMER 1.87*  --  1.89* 1.70*  FERRITIN  --  1,318* 1,476* 1,157*  LDH  --  245*  --   --   CRP  --  18.2* 14.8* 11.3*    Lab Results  Component Value Date   SARSCOV2NAA POSITIVE (A) 06/07/2019   [place] NEGATIVE 04/10/2019   [place] NOT DETECTED 01/24/2019   [place] NEGATIVE 01/21/2019    2-COPD with chronic Hypoxic Respiratory Failure.  -On 2-3 L chronic oxygen supplementation.  -Continue oxygen supplementation.  -Continue Dexamethasone.  -Continue Combivent.  [place]  3-ESRD on HD;  Nephrology consulted.  Needs HD> which is planned for tomorrow  4-Thrombocytopenia;  Related to acute illness.  Follow trend.   DM insulin Dependent, poorly controlled.  A1c 7.9.  Continue SSI.   Myoclonus;  He has chronic tremors, worse lately.  Seems to be improving.  Hold gabapentin  HNT; Hold BP medications, SBP soft.   Metabolic acidosis;  Continue newly started sodium bicarb. Hopefully improve with HD.    Estimated body mass index is 28.6 kg/m as calculated from the following:   Height as of 04/14/19: 5\' 11"  (1.803 m).   Weight as of this encounter: 93 kg.   DVT prophylaxis: Heparin  Code Status: full code Family Communication:  Patient at the bedside Disposition Plan: Remain in the hospital for treatment of PNA in setting of covid, worsening tremors  Consultants:   Nephrology  Procedures:   none  Antimicrobials:    Subjective: He feels tired but slightly better today. His tremors have much improved.   Objective: Vitals:   06/09/19  1946 06/10/19 0508 06/10/19 0826 06/10/19 1628  BP: (!) 118/51 (!) 129/56 131/66 119/63  Pulse: 66 63 (!) 59 61  Resp:   (!) 22 (!) 23  Temp: 98.5 F (36.9 C) 98.8 F (37.1 C) (!) 97.5 F (36.4 C) 98.1 F (36.7 C)  TempSrc: Oral Oral    SpO2: 94% 100% 91% 92%  Weight:        Intake/Output  Summary (Last 24 hours) at 06/10/2019 1656 Last data filed at 06/10/2019 1038 Gross per 24 hour  Intake 240 ml  Output --  Net 240 ml   Filed Weights   06/09/19 1255 06/09/19 1630  Weight: 94 kg 93 kg    Examination:  General exam; NAD Respiratory system: distant, no wheezing, no respiratory distress Cardiovascular system: S 1, S 2 RRR Gastrointestinal system: Soft, NT, ND Central nervous system: alert, oriented to person and place Extremities: Symmetric power, no edema Skin: no rashes    Data Reviewed: I have personally reviewed following labs and imaging studies  CBC: Recent Labs  Lab 06/07/19 2226 06/09/19 0804 06/10/19 0400  WBC 5.4 4.9 2.5*  NEUTROABS 3.9 3.1 1.8  HGB 14.3 12.3* 11.8*  HCT 49.8 39.0 36.9*  MCV 94.7 87.6 85.0  PLT 117* 147* 025   Basic Metabolic Panel: Recent Labs  Lab 06/07/19 2330 06/09/19 0804 06/10/19 0400  NA 134* 142 140  K 4.8 5.0 4.7  CL 94* 95* 96*  CO2 16* 16* 17*  GLUCOSE 179* 107* 333*  BUN 88* 122* 105*  CREATININE 13.68* 16.47* 14.07*  CALCIUM 7.1* 6.9* 7.1*   GFR: Estimated Creatinine Clearance: 5 mL/min (A) (by C-G formula based on SCr of 14.07 mg/dL (H)). Liver Function Tests: Recent Labs  Lab 06/07/19 2330 06/09/19 0804 06/10/19 0400  AST 44* 47* 40  ALT 44 39 38  ALKPHOS 102 95 98  BILITOT 0.4 0.4 0.5  PROT 6.9 6.6 6.4*  ALBUMIN 2.8* 2.7* 2.5*   No results for input(s): LIPASE, AMYLASE in the last 168 hours. No results for input(s): AMMONIA in the last 168 hours. Coagulation Profile: No results for input(s): INR, PROTIME in the last 168 hours. Cardiac Enzymes: No results for input(s): CKTOTAL, CKMB, CKMBINDEX, TROPONINI in the last 168 hours. BNP (last 3 results) No results for input(s): PROBNP in the last 8760 hours. HbA1C: No results for input(s): HGBA1C in the last 72 hours. CBG: Recent Labs  Lab 06/09/19 1629 06/09/19 2022 06/10/19 0821 06/10/19 1249 06/10/19 1624  GLUCAP 140* 184* 193* 131*  182*   Lipid Profile: Recent Labs    06/07/19 2330  TRIG 560*   Thyroid Function Tests: No results for input(s): TSH, T4TOTAL, FREET4, T3FREE, THYROIDAB in the last 72 hours. Anemia Panel: Recent Labs    06/09/19 0804 06/10/19 0400  FERRITIN 1,476* 1,157*   Sepsis Labs: Recent Labs  Lab 06/07/19 2226 06/07/19 2330  PROCALCITON  --  2.85  LATICACIDVEN 1.7  --     Recent Results (from the past 240 hour(s))  Blood Culture (routine x 2)     Status: None (Preliminary result)   Collection Time: 06/07/19 10:26 PM   Specimen: BLOOD RIGHT HAND  Result Value Ref Range Status   Specimen Description BLOOD RIGHT HAND  Final   Special Requests   Final    BOTTLES DRAWN AEROBIC AND ANAEROBIC Blood Culture results may not be optimal due to an inadequate volume of blood received in culture bottles   Culture   Final    NO  GROWTH 3 DAYS Performed at [facility], [place] [address], [place], [place] [address]    Report Status PENDING  Incomplete  SARS CORONAVIRUS 2 (TAT 6-24 HRS) Nasopharyngeal Nasopharyngeal Swab     Status: Abnormal   Collection Time: 06/07/19 10:30 PM   Specimen: Nasopharyngeal Swab  Result Value Ref Range Status   SARS Coronavirus 2 POSITIVE (A) NEGATIVE Final    Comment: RESULT CALLED TO, READ BACK BY AND VERIFIED WITH: K. MOON,RN 0340 06/08/2019 T. TYSOR (NOTE) SARS-CoV-2 target nucleic acids are DETECTED. The SARS-CoV-2 RNA is generally detectable in upper and lower respiratory specimens during the acute phase of infection. Positive results are indicative of the presence of SARS-CoV-2 RNA. Clinical correlation with patient history and other diagnostic information is  necessary to determine patient infection status. Positive results do not rule out bacterial infection or co-infection with other viruses.  The expected result is Negative. Fact Sheet for Patients: SugarRoll.be Fact Sheet for Healthcare  Providers: https://www.woods-mathews.com/ This test is not yet approved or cleared by the [place] FDA and  has been authorized for detection and/or diagnosis of SARS-CoV-2 by FDA under an Emergency Use Authorization (EUA). This EUA will remain  in effect (meaning this test can be used) for the  duration of the COVID-19 declaration under Section 564(b)(1) of the Act, 21 U.S.C. section 360bbb-3(b)(1), unless the authorization is terminated or revoked sooner. Performed at [facility], [place] [address] [place], [place] [address]   Blood Culture (routine x 2)     Status: None (Preliminary result)   Collection Time: 06/08/19 12:20 AM   Specimen: BLOOD  Result Value Ref Range Status   Specimen Description BLOOD RIGHT ANTECUBITAL  Final   Special Requests   Final    BOTTLES DRAWN AEROBIC AND ANAEROBIC Blood Culture results may not be optimal due to an inadequate volume of blood received in culture bottles   Culture   Final    NO GROWTH 2 DAYS Performed at [facility], [place] [address], [place], [place] [address]    Report Status PENDING  Incomplete         Radiology Studies: DG CHEST PORT 1 VIEW  Result Date: 06/10/2019 CLINICAL DATA:  79 year old male positive COVID-19. Pneumonia. Dialysis patient. EXAM: PORTABLE CHEST 1 VIEW COMPARISON:  06/07/2019 portable chest and earlier. FINDINGS: Portable AP semi upright view at 0713 hours. Stable lung volumes and mediastinal contours. Left perihilar and widespread left mid lung patchy opacity has mildly progressed since 06/07/2019. Mild coarse interstitial markings elsewhere appear stable. There is now trace fluid or thickening along the right minor fissure, but no other pleural effusion is evident. No pneumothorax. Visualized tracheal air column is within normal limits. IMPRESSION: 1. Mild progression of COVID-19 pneumonia in the left lung since 06/07/2019. 2. Trace new pleural fluid, but pulmonary vascularity appears  stable. Electronically Signed   By: Genevie Ann M.D.   On: 06/10/2019 08:51        Scheduled Meds: . Chlorhexidine Gluconate Cloth  6 each Topical Q0600  . dexamethasone (DECADRON) injection  6 mg Intravenous Daily  . insulin aspart  0-5 Units Subcutaneous QHS  . insulin aspart  0-9 Units Subcutaneous TID WC  . insulin aspart  2 Units Subcutaneous TID WC  . insulin glargine  8 Units Subcutaneous BID  . Ipratropium-Albuterol  1 puff Inhalation Q6H  . isosorbide mononitrate  30 mg Oral Daily  . magnesium oxide  400 mg Oral QHS  . mometasone-formoterol  2 puff Inhalation BID  . sodium bicarbonate  1,300 mg Oral BID  . sodium chloride flush  3 mL Intravenous Q12H  . vitamin B-12  1,000 mcg Oral QPM   Continuous Infusions: . sodium chloride    . sodium chloride    . azithromycin 500 mg (06/10/19 0112)  . cefTRIAXone (ROCEPHIN)  IV 2 g (06/10/19 0306)  . remdesivir 100 mg in NS 100 mL 100 mg (06/10/19 0923)     LOS: 2 days    Time spent: 35 minutes.     Blain Pais, MD [facility]   If 7PM-7AM, please contact night-coverage [email or web address] Password [facility] 06/10/2019, 4:56 PM

## 2019-06-11 LAB — CBC WITH DIFFERENTIAL/PLATELET
Abs Immature Granulocytes: 0.17 10*3/uL — ABNORMAL HIGH (ref 0.00–0.07)
Basophils Absolute: 0 10*3/uL (ref 0.0–0.1)
Basophils Relative: 0 %
Eosinophils Absolute: 0 10*3/uL (ref 0.0–0.5)
Eosinophils Relative: 0 %
HCT: 38.4 % — ABNORMAL LOW (ref 39.0–52.0)
Hemoglobin: 12.1 g/dL — ABNORMAL LOW (ref 13.0–17.0)
Immature Granulocytes: 4 %
Lymphocytes Relative: 21 %
Lymphs Abs: 0.9 10*3/uL (ref 0.7–4.0)
MCH: 27.1 pg (ref 26.0–34.0)
MCHC: 31.5 g/dL (ref 30.0–36.0)
MCV: 86.1 fL (ref 80.0–100.0)
Monocytes Absolute: 0.4 10*3/uL (ref 0.1–1.0)
Monocytes Relative: 9 %
Neutro Abs: 2.8 10*3/uL (ref 1.7–7.7)
Neutrophils Relative %: 66 %
Platelets: 161 10*3/uL (ref 150–400)
RBC: 4.46 MIL/uL (ref 4.22–5.81)
RDW: 16.2 % — ABNORMAL HIGH (ref 11.5–15.5)
WBC: 4.2 10*3/uL (ref 4.0–10.5)
nRBC: 0 % (ref 0.0–0.2)

## 2019-06-11 LAB — GLUCOSE, CAPILLARY
Glucose-Capillary: 127 mg/dL — ABNORMAL HIGH (ref 70–99)
Glucose-Capillary: 107 mg/dL — ABNORMAL HIGH (ref 70–99)
Glucose-Capillary: 200 mg/dL — ABNORMAL HIGH (ref 70–99)
Glucose-Capillary: 163 mg/dL — ABNORMAL HIGH (ref 70–99)

## 2019-06-11 LAB — COMPREHENSIVE METABOLIC PANEL
ALT: 34 U/L (ref 0–44)
AST: 33 U/L (ref 15–41)
Albumin: 2.6 g/dL — ABNORMAL LOW (ref 3.5–5.0)
Alkaline Phosphatase: 87 U/L (ref 38–126)
Anion gap: 29 — ABNORMAL HIGH (ref 5–15)
BUN: 124 mg/dL — ABNORMAL HIGH (ref 8–23)
CO2: 15 mmol/L — ABNORMAL LOW (ref 22–32)
Calcium: 7.4 mg/dL — ABNORMAL LOW (ref 8.9–10.3)
Chloride: 96 mmol/L — ABNORMAL LOW (ref 98–111)
Creatinine, Ser: 14.94 mg/dL — ABNORMAL HIGH (ref 0.61–1.24)
GFR calc Af Amer: 3 mL/min — ABNORMAL LOW (ref >60)
GFR calc non Af Amer: 3 mL/min — ABNORMAL LOW (ref >60)
Glucose, Bld: 187 mg/dL — ABNORMAL HIGH (ref 70–99)
Potassium: 4.6 mmol/L (ref 3.5–5.1)
Sodium: 140 mmol/L (ref 135–145)
Total Bilirubin: 0.5 mg/dL (ref 0.3–1.2)
Total Protein: 6.4 g/dL — ABNORMAL LOW (ref 6.5–8.1)

## 2019-06-11 LAB — FERRITIN: Ferritin: 968 ng/mL — ABNORMAL HIGH (ref 24–336)

## 2019-06-11 LAB — D-DIMER, QUANTITATIVE: D-Dimer, Quant: 1.8 ug/mL-FEU — ABNORMAL HIGH (ref 0.00–0.50)

## 2019-06-11 LAB — C-REACTIVE PROTEIN: CRP: 7.9 mg/dL — ABNORMAL HIGH (ref <1.0)

## 2019-06-11 NOTE — Progress Notes (Signed)
Patient ID: Thomas [name], male   DOB: [birth year], 79 y.o.   MRN: [id number] S: No overnight events by chart review but still on 15L   O:BP (!) 142/74 (BP Location: Right Arm)   Pulse 60   Temp 97.8 F (36.6 C) (Oral)   Resp 16   Wt 93.4 kg   SpO2 99%   BMI 28.70 kg/m   Intake/Output Summary (Last 24 hours) at 06/11/2019 0801 Last data filed at 06/10/2019 1038 Gross per 24 hour  Intake 240 ml  Output -  Net 240 ml   Intake/Output: I/O last 3 completed shifts: In: 240 [P.O.:240] Out: -   Intake/Output this shift:  No intake/output data recorded. Weight change: -0.65 kg Physical exam: unable to complete due to COVID + status.  In order to preserve PPE equipment and to minimize exposure to providers.  Notes from other caregivers reviewed   Recent Labs  Lab 06/07/19 2330 06/09/19 0804 06/10/19 0400 06/11/19 0512  NA 134* 142 140 140  K 4.8 5.0 4.7 4.6  CL 94* 95* 96* 96*  CO2 16* 16* 17* 15*  GLUCOSE 179* 107* 333* 187*  BUN 88* 122* 105* 124*  CREATININE 13.68* 16.47* 14.07* 14.94*  ALBUMIN 2.8* 2.7* 2.5* 2.6*  CALCIUM 7.1* 6.9* 7.1* 7.4*  AST 44* 47* 40 33  ALT 44 39 38 34   Liver Function Tests: Recent Labs  Lab 06/09/19 0804 06/10/19 0400 06/11/19 0512  AST 47* 40 33  ALT 39 38 34  ALKPHOS 95 98 87  BILITOT 0.4 0.5 0.5  PROT 6.6 6.4* 6.4*  ALBUMIN 2.7* 2.5* 2.6*   No results for input(s): LIPASE, AMYLASE in the last 168 hours. No results for input(s): AMMONIA in the last 168 hours. CBC: Recent Labs  Lab 06/07/19 2226 06/09/19 0804 06/10/19 0400 06/11/19 0512  WBC 5.4 4.9 2.5* 4.2  NEUTROABS 3.9 3.1 1.8 2.8  HGB 14.3 12.3* 11.8* 12.1*  HCT 49.8 39.0 36.9* 38.4*  MCV 94.7 87.6 85.0 86.1  PLT 117* 147* 166 161   Cardiac Enzymes: No results for input(s): CKTOTAL, CKMB, CKMBINDEX, TROPONINI in the last 168 hours. CBG: Recent Labs  Lab 06/10/19 0821 06/10/19 1249 06/10/19 1624 06/10/19 2044 06/11/19 0737  GLUCAP 193* 131* 182* 211* 127*     Iron Studies:  Recent Labs    06/11/19 0512  FERRITIN 968*   Studies/Results: DG CHEST PORT 1 VIEW  Result Date: 06/10/2019 CLINICAL DATA:  79 year old male positive COVID-19. Pneumonia. Dialysis patient. EXAM: PORTABLE CHEST 1 VIEW COMPARISON:  06/07/2019 portable chest and earlier. FINDINGS: Portable AP semi upright view at 0713 hours. Stable lung volumes and mediastinal contours. Left perihilar and widespread left mid lung patchy opacity has mildly progressed since 06/07/2019. Mild coarse interstitial markings elsewhere appear stable. There is now trace fluid or thickening along the right minor fissure, but no other pleural effusion is evident. No pneumothorax. Visualized tracheal air column is within normal limits. IMPRESSION: 1. Mild progression of COVID-19 pneumonia in the left lung since 06/07/2019. 2. Trace new pleural fluid, but pulmonary vascularity appears stable. Electronically Signed   By: Genevie Ann M.D.   On: 06/10/2019 08:51   . Chlorhexidine Gluconate Cloth  6 each Topical Q0600  . dexamethasone (DECADRON) injection  6 mg Intravenous Daily  . insulin aspart  0-5 Units Subcutaneous QHS  . insulin aspart  0-9 Units Subcutaneous TID WC  . insulin aspart  2 Units Subcutaneous TID WC  . insulin glargine  8 Units Subcutaneous  BID  . Ipratropium-Albuterol  1 puff Inhalation Q6H  . isosorbide mononitrate  30 mg Oral Daily  . magnesium oxide  400 mg Oral QHS  . mometasone-formoterol  2 puff Inhalation BID  . sodium bicarbonate  1,300 mg Oral BID  . sodium chloride flush  3 mL Intravenous Q12H  . vitamin B-12  1,000 mcg Oral QPM    BMET    Component Value Date/Time   NA 140 06/11/2019 0512   NA 137 06/30/2012 1305   K 4.6 06/11/2019 0512   K 4.1 06/30/2012 1305   CL 96 (L) 06/11/2019 0512   CL 106 06/30/2012 1305   CO2 15 (L) 06/11/2019 0512   CO2 24 06/30/2012 1305   GLUCOSE 187 (H) 06/11/2019 0512   BUN 124 (H) 06/11/2019 0512   BUN 62 (A) 06/30/2012 1305    CREATININE 14.94 (H) 06/11/2019 0512   CREATININE 4.09 (H) 07/23/2012 0941   CALCIUM 7.4 (L) 06/11/2019 0512   CALCIUM 8.1 06/30/2012 1305   GFRNONAA 3 (L) 06/11/2019 0512   GFRAA 3 (L) 06/11/2019 0512   CBC    Component Value Date/Time   WBC 4.2 06/11/2019 0512   RBC 4.46 06/11/2019 0512   HGB 12.1 (L) 06/11/2019 0512   HCT 38.4 (L) 06/11/2019 0512   HCT 32 06/29/2012 1308   PLT 161 06/11/2019 0512   MCV 86.1 06/11/2019 0512   MCH 27.1 06/11/2019 0512   MCHC 31.5 06/11/2019 0512   RDW 16.2 (H) 06/11/2019 0512   LYMPHSABS 0.9 06/11/2019 0512   MONOABS 0.4 06/11/2019 0512   EOSABS 0.0 06/11/2019 0512   BASOSABS 0.0 06/11/2019 0512    Dialysis Access: Left AVF  Dialysis Orders: Center: [facility]  on MWF . EDW 94.5kg HD [place] 1K/2.5Ca  Time 4 hours Heparin 1600units IVP bolus then 1400 units/hr. Access LAVF BFR 350 DFR 600    Hectoral 4.5 mcg IV/HD Venofer  50 mg weekly    Assessment/Plan: 1.  Acute hypoxic respiratory failure due to Covid-19 pneumonia- started on rocephin, azithromycin in ED as well as decadron and remdesivir. 1. Requiring 15 liters high flow oxygen 2.  ESRD -  Plan for HD today in his room as he remains on 15L --> when oxygenation improved will travel for HD.  Will UF as tolerated to help with his high oxygen demands.  He only tolerated 1L last tx and doesn't have much po intake - will try 1-1.5L 3.  Hypertension/volume  - BP on low side.  Follow with UF 4.  Anemia  - No esa due to Hgb >12 5.  Metabolic bone disease -  Cont with home meds 6.  Nutrition -  Renal diet. 7. DM- per primary svc. 8. COPD- was on home o2 prior to admission.

## 2019-06-11 NOTE — Progress Notes (Signed)
PROGRESS NOTE    Thomas [name]  JOA:[id number] DOB: [birth year] DOA: 06/07/2019 PCP: [facility], [facility]   Brief Narrative: 79 y.o. 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  Patient symptoms began on 06/03/2019 with loose stools and generalized weakness.  He also went on to develop increase in his chronic dyspnea and had a negative rapid Covid test on 06/05/2019.  He denies any abdominal pain or chest pain. Wife reports that his chronic jerking movements have worsened over the past week.   Evaluation in the ED patient was found to be hypoxic, oxygen sat in 90s on his usual 2 to 3 L of oxygen, chest x-ray consistent with fluid overload interstitial edema possible pneumonia.  Covid antigen test positive.   Assessment & Plan:   Principal Problem:   Pneumonia Active Problems:   Insulin-requiring or dependent type II diabetes mellitus ([place])   Diarrhea of presumed infectious origin   End stage renal disease (HCC)   Sepsis ([place])   Thrombocytopenia ([place])   Chronic respiratory failure with hypoxia (HCC)   COPD (chronic obstructive pulmonary disease) (HCC)   HTN (hypertension)   COVID-19 virus infection   Pneumonia due to COVID-19 virus   1-Acute on chronic hypoxic respiratory failure, worsening secondary to COVID-19 pneumonia: -Patient presented with weakness, increased shortness of breath chest x-ray with possible pneumonia. -Procalcitonin elevated; started on ceftriaxone and azithromycin, which will be continued until 1/8 -Continue with Decadron and remdesivir. -Oxygen requirement increased again to 15 L/min via face mask working on weaning  down. He reports having O2 at home for night use.    COVID-19 Labs  Recent Labs    06/09/19 0804 06/10/19 0400 06/11/19 0512  DDIMER 1.89* 1.70* 1.80*  FERRITIN 1,476* 1,157* 968*  CRP 14.8* 11.3* 7.9*    Lab Results  Component Value Date   SARSCOV2NAA POSITIVE (A) 06/07/2019   [place] NEGATIVE 04/10/2019   SARSCOV2NAA NOT DETECTED 01/24/2019   [place] NEGATIVE 01/21/2019    2-COPD with chronic Hypoxic Respiratory Failure.  -On 2-3 L chronic oxygen supplementation at home.  -Continue oxygen supplementation.  -Continue Dexamethasone.  -Continue Combivent.  [place]  3-ESRD on HD;  Nephrology consulted.  HD per their recs  4-Thrombocytopenia;  Related to acute illness.  Follow trend.   DM insulin Dependent, poorly controlled.  A1c 7.9.  Continue SSI.   Myoclonus;  He has chronic tremors, worse lately.  Seems to be improving.  Hold gabapentin  HNT; Hold BP medications, SBP soft.   Metabolic acidosis;  Continue newly started sodium bicarb. Hopefully improve with HD.    Estimated body mass index is 28.7 kg/m as calculated from the following:   Height as of 04/14/19: 5\' 11"  (1.803 m).   Weight as of this encounter: 93.4 kg.   DVT prophylaxis: Heparin  Code Status: full code Family Communication:  Patient at the bedside Disposition Plan: Remains in the hospital for treatment of PNA in setting of covid Consultants:   Nephrology  Procedures:   none  Antimicrobials:    Subjective: His O2 levels fluctuate, remains on 15/L O2 via face mask but reports feeling better.   Objective: Vitals:   06/10/19 2050 06/11/19 0544 06/11/19 0741 06/11/19 1611  BP: (!) 100/53  (!) 142/74 (!) 155/62  Pulse: 65  60 66  Resp: (!) 24  16 18   Temp: 98.5 F (36.9 C)  97.8 F (36.6 C) 98.6 F (37 C)  TempSrc: Oral  Oral Axillary  SpO2: 93%  99% 99%  Weight:  93.4 kg     No intake or output data in the 24 hours ending 06/11/19 1800 Filed Weights   06/09/19 1255  06/09/19 1630 06/11/19 0544  Weight: 94 kg 93 kg 93.4 kg    Examination:  General exam; NAD Respiratory system:  no wheezing, no respiratory distress Cardiovascular system: S 1, S 2 RRR Gastrointestinal system: Soft, NT, ND Central nervous system: alert, oriented to person and place Extremities: Symmetric power, no edema Skin: no rashes    Data Reviewed: I have personally reviewed following labs and imaging studies  CBC: Recent Labs  Lab 06/07/19 2226 06/09/19 0804 06/10/19 0400 06/11/19 0512  WBC 5.4 4.9 2.5* 4.2  NEUTROABS 3.9 3.1 1.8 2.8  HGB 14.3 12.3* 11.8* 12.1*  HCT 49.8 39.0 36.9* 38.4*  MCV 94.7 87.6 85.0 86.1  PLT 117* 147* 166 193   Basic Metabolic Panel: Recent Labs  Lab 06/07/19 2330 06/09/19 0804 06/10/19 0400 06/11/19 0512  NA 134* 142 140 140  K 4.8 5.0 4.7 4.6  CL 94* 95* 96* 96*  CO2 16* 16* 17* 15*  GLUCOSE 179* 107* 333* 187*  BUN 88* 122* 105* 124*  CREATININE 13.68* 16.47* 14.07* 14.94*  CALCIUM 7.1* 6.9* 7.1* 7.4*   GFR: Estimated Creatinine Clearance: 4.8 mL/min (A) (by C-G formula based on SCr of 14.94 mg/dL (H)). Liver Function Tests: Recent Labs  Lab 06/07/19 2330 06/09/19 0804 06/10/19 0400 06/11/19 0512  AST 44* 47* 40 33  ALT 44 39 38 34  ALKPHOS 102 95 98 87  BILITOT 0.4 0.4 0.5 0.5  PROT 6.9 6.6 6.4* 6.4*  ALBUMIN 2.8* 2.7* 2.5* 2.6*   No results for input(s): LIPASE, AMYLASE in the last 168 hours. No results for input(s): AMMONIA in the last 168 hours. Coagulation Profile: No results for input(s): INR, PROTIME in the last 168 hours. Cardiac Enzymes: No results for input(s): CKTOTAL, CKMB, CKMBINDEX, TROPONINI in the last 168 hours. BNP (last 3 results) No results for input(s): PROBNP in the last 8760 hours. HbA1C: No results for input(s): HGBA1C in the last 72 hours. CBG: Recent Labs  Lab 06/10/19 1624 06/10/19 2044 06/11/19 0737 06/11/19 1113 06/11/19 1608  GLUCAP 182* 211* 127* 107* 200*   Lipid  Profile: No results for input(s): CHOL, HDL, LDLCALC, TRIG, CHOLHDL, LDLDIRECT in the last 72 hours. Thyroid Function Tests: No results for input(s): TSH, T4TOTAL, FREET4, T3FREE, THYROIDAB in the last 72 hours. Anemia Panel: Recent Labs    06/10/19 0400 06/11/19 0512  FERRITIN 1,157* 968*   Sepsis Labs: Recent Labs  Lab 06/07/19 2226 06/07/19 2330  PROCALCITON  --  2.85  LATICACIDVEN 1.7  --     Recent Results (from the past 240 hour(s))  Blood Culture (routine x 2)     Status: None (Preliminary result)   Collection Time: 06/07/19 10:26 PM   Specimen: BLOOD RIGHT HAND  Result Value Ref Range Status   Specimen Description BLOOD RIGHT HAND  Final   Special Requests   Final    BOTTLES DRAWN AEROBIC AND ANAEROBIC Blood Culture results may not be optimal due to an inadequate volume of blood received in culture bottles   Culture   Final    NO GROWTH 4 DAYS Performed at [facility]  Lab, [address], [place], [place] [address]    Report Status PENDING  Incomplete  SARS CORONAVIRUS 2 (TAT 6-24 HRS) Nasopharyngeal Nasopharyngeal Swab     Status: Abnormal   Collection Time: 06/07/19 10:30 PM   Specimen: Nasopharyngeal Swab  Result Value Ref Range Status   SARS Coronavirus 2 POSITIVE (A) NEGATIVE Final    Comment: RESULT CALLED TO, READ BACK BY AND VERIFIED WITH: K. MOON,RN 0340 06/08/2019 T. TYSOR (NOTE) SARS-CoV-2 target nucleic acids are DETECTED. The SARS-CoV-2 RNA is generally detectable in upper and lower respiratory specimens during the acute phase of infection. Positive results are indicative of the presence of SARS-CoV-2 RNA. Clinical correlation with patient history and other diagnostic information is  necessary to determine patient infection status. Positive results do not rule out bacterial infection or co-infection with other viruses.  The expected result is Negative. Fact Sheet for Patients: SugarRoll.be Fact Sheet for  Healthcare Providers: https://www.woods-mathews.com/ This test is not yet approved or cleared by the [place] FDA and  has been authorized for detection and/or diagnosis of SARS-CoV-2 by FDA under an Emergency Use Authorization (EUA). This EUA will remain  in effect (meaning this test can be used) for the  duration of the COVID-19 declaration under Section 564(b)(1) of the Act, 21 U.S.C. section 360bbb-3(b)(1), unless the authorization is terminated or revoked sooner. Performed at [facility], [place] [address] [place], [place] [address]   Blood Culture (routine x 2)     Status: None (Preliminary result)   Collection Time: 06/08/19 12:20 AM   Specimen: BLOOD  Result Value Ref Range Status   Specimen Description BLOOD RIGHT ANTECUBITAL  Final   Special Requests   Final    BOTTLES DRAWN AEROBIC AND ANAEROBIC Blood Culture results may not be optimal due to an inadequate volume of blood received in culture bottles   Culture   Final    NO GROWTH 3 DAYS Performed at [facility], [place] [address] [place], [place] [address]    Report Status PENDING  Incomplete         Radiology Studies: DG CHEST PORT 1 VIEW  Result Date: 06/10/2019 CLINICAL DATA:  79 year old male positive COVID-19. Pneumonia. Dialysis patient. EXAM: PORTABLE CHEST 1 VIEW COMPARISON:  06/07/2019 portable chest and earlier. FINDINGS: Portable AP semi upright view at 0713 hours. Stable lung volumes and mediastinal contours. Left perihilar and widespread left mid lung patchy opacity has mildly progressed since 06/07/2019. Mild coarse interstitial markings elsewhere appear stable. There is now trace fluid or thickening along the right minor fissure, but no other pleural effusion is evident. No pneumothorax. Visualized tracheal air column is within normal limits. IMPRESSION: 1. Mild progression of COVID-19 pneumonia in the left lung since 06/07/2019. 2. Trace new pleural fluid, but pulmonary vascularity  appears stable. Electronically Signed   By: Genevie Ann M.D.   On: 06/10/2019 08:51        Scheduled Meds: . Chlorhexidine Gluconate Cloth  6 each Topical Q0600  . dexamethasone (DECADRON) injection  6 mg Intravenous Daily  . insulin aspart  0-5 Units Subcutaneous QHS  . insulin aspart  0-9 Units Subcutaneous TID WC  . insulin aspart  2 Units Subcutaneous TID WC  . insulin glargine  8 Units Subcutaneous BID  . Ipratropium-Albuterol  1 puff Inhalation Q6H  . isosorbide mononitrate  30 mg Oral Daily  . magnesium oxide  400 mg Oral QHS  . mometasone-formoterol  2 puff Inhalation BID  . sodium  bicarbonate  1,300 mg Oral BID  . sodium chloride flush  3 mL Intravenous Q12H  . vitamin B-12  1,000 mcg Oral QPM   Continuous Infusions: . sodium chloride    . sodium chloride    . azithromycin 500 mg (06/11/19 0152)  . cefTRIAXone (ROCEPHIN)  IV 2 g (06/11/19 0259)  . remdesivir 100 mg in NS 100 mL 100 mg (06/11/19 0911)     LOS: 3 days    Time spent: 35 minutes.     Blain Pais, MD [facility]   If 7PM-7AM, please contact night-coverage [email or web address] Password [facility] 06/11/2019, 6:00 PM

## 2019-06-12 LAB — CBC WITH DIFFERENTIAL/PLATELET
Abs Immature Granulocytes: 0.17 10*3/uL — ABNORMAL HIGH (ref 0.00–0.07)
Basophils Absolute: 0 10*3/uL (ref 0.0–0.1)
Basophils Relative: 0 %
Eosinophils Absolute: 0 10*3/uL (ref 0.0–0.5)
Eosinophils Relative: 0 %
HCT: 39.8 % (ref 39.0–52.0)
Hemoglobin: 12.6 g/dL — ABNORMAL LOW (ref 13.0–17.0)
Immature Granulocytes: 3 %
Lymphocytes Relative: 17 %
Lymphs Abs: 0.9 10*3/uL (ref 0.7–4.0)
MCH: 27 pg (ref 26.0–34.0)
MCHC: 31.7 g/dL (ref 30.0–36.0)
MCV: 85.4 fL (ref 80.0–100.0)
Monocytes Absolute: 0.4 10*3/uL (ref 0.1–1.0)
Monocytes Relative: 7 %
Neutro Abs: 3.9 10*3/uL (ref 1.7–7.7)
Neutrophils Relative %: 73 %
Platelets: 181 10*3/uL (ref 150–400)
RBC: 4.66 MIL/uL (ref 4.22–5.81)
RDW: 16.2 % — ABNORMAL HIGH (ref 11.5–15.5)
WBC: 5.4 10*3/uL (ref 4.0–10.5)
nRBC: 0 % (ref 0.0–0.2)

## 2019-06-12 LAB — COMPREHENSIVE METABOLIC PANEL
ALT: 32 U/L (ref 0–44)
AST: 32 U/L (ref 15–41)
Albumin: 2.6 g/dL — ABNORMAL LOW (ref 3.5–5.0)
Alkaline Phosphatase: 95 U/L (ref 38–126)
Anion gap: 24 — ABNORMAL HIGH (ref 5–15)
BUN: 90 mg/dL — ABNORMAL HIGH (ref 8–23)
CO2: 20 mmol/L — ABNORMAL LOW (ref 22–32)
Calcium: 8 mg/dL — ABNORMAL LOW (ref 8.9–10.3)
Chloride: 92 mmol/L — ABNORMAL LOW (ref 98–111)
Creatinine, Ser: 12.17 mg/dL — ABNORMAL HIGH (ref 0.61–1.24)
GFR calc Af Amer: 4 mL/min — ABNORMAL LOW (ref >60)
GFR calc non Af Amer: 3 mL/min — ABNORMAL LOW (ref >60)
Glucose, Bld: 146 mg/dL — ABNORMAL HIGH (ref 70–99)
Potassium: 4.3 mmol/L (ref 3.5–5.1)
Sodium: 136 mmol/L (ref 135–145)
Total Bilirubin: 0.6 mg/dL (ref 0.3–1.2)
Total Protein: 6.4 g/dL — ABNORMAL LOW (ref 6.5–8.1)

## 2019-06-12 LAB — GLUCOSE, CAPILLARY
Glucose-Capillary: 78 mg/dL (ref 70–99)
Glucose-Capillary: 106 mg/dL — ABNORMAL HIGH (ref 70–99)
Glucose-Capillary: 230 mg/dL — ABNORMAL HIGH (ref 70–99)
Glucose-Capillary: 147 mg/dL — ABNORMAL HIGH (ref 70–99)

## 2019-06-12 LAB — CULTURE, BLOOD (ROUTINE X 2): Culture: NO GROWTH

## 2019-06-12 LAB — FERRITIN: Ferritin: 836 ng/mL — ABNORMAL HIGH (ref 24–336)

## 2019-06-12 LAB — D-DIMER, QUANTITATIVE: D-Dimer, Quant: 2.73 ug/mL-FEU — ABNORMAL HIGH (ref 0.00–0.50)

## 2019-06-12 LAB — C-REACTIVE PROTEIN: CRP: 6.9 mg/dL — ABNORMAL HIGH (ref <1.0)

## 2019-06-12 NOTE — Progress Notes (Signed)
Physical Therapy Treatment Patient Details Name: Thomas [name] MRN: [id number] DOB: 03-31-1941 Today's Date: 06/12/2019    History of Present Illness 79 yo male admitted with COVID + acute hypoxic respiratory failure, hypertension, anemia and metabolic bone disease PMH ESRD on HD MWF COPD 3L at home, CAD, Neuropathy, nose cancer with nose amputation DM     PT Comments    Pt sat EOB for a few minutes and then became dizzy and nauseous. Pt returned to supine and with dry heaves. BP checked and WNL. Dizziness and nausea resolved. May need SNF if mobility doesn't begin to progress.    Follow Up Recommendations  SNF(unless mobility improves)     Equipment Recommendations  None recommended by PT    Recommendations for Other Services       Precautions / Restrictions Precautions Precautions: Fall Restrictions Weight Bearing Restrictions: No    Mobility  Bed Mobility Overal bed mobility: Needs Assistance Bed Mobility: Supine to Sit;Sit to Supine;Rolling Rolling: Min guard   Supine to sit: Min assist;HOB elevated Sit to supine: Min guard   General bed mobility comments: Assist to elevate trunk into sitting. Pt returned to supine quickly when he began having dizziness and nausea.   Transfers                 General transfer comment: Unable to assess due to dizziness and nausea.  Ambulation/Gait                 Stairs             Wheelchair Mobility    Modified Rankin (Stroke Patients Only)       Balance Overall balance assessment: Needs assistance Sitting-balance support: No upper extremity supported;Feet supported Sitting balance-Leahy Scale: Good                                      Cognition Arousal/Alertness: Awake/alert Behavior During Therapy: WFL for tasks assessed/performed Overall Cognitive Status: Within Functional Limits for tasks assessed                                 General Comments: talkative  and needed redirection       Exercises      General Comments General comments (skin integrity, edema, etc.): Pt on face mask due to nose amputation. Checked BP after pt returned to supine with reading within normal limits      Pertinent Vitals/Pain Pain Assessment: No/denies pain    Home Living                      Prior Function            PT Goals (current goals can now be found in the care plan section) Acute Rehab PT Goals Patient Stated Goal: go home Progress towards PT goals: Not progressing toward goals - comment    Frequency    Min 3X/week      PT Plan Discharge plan needs to be updated    Co-evaluation              AM-PAC PT "6 Clicks" Mobility   Outcome Measure  Help needed turning from your back to your side while in a flat bed without using bedrails?: A Little Help needed moving from lying on your back to sitting on the side  of a flat bed without using bedrails?: A Little Help needed moving to and from a bed to a chair (including a wheelchair)?: Total Help needed standing up from a chair using your arms (e.g., wheelchair or bedside chair)?: Total Help needed to walk in hospital room?: Total Help needed climbing 3-5 steps with a railing? : Total 6 Click Score: 10    End of Session Equipment Utilized During Treatment: Oxygen Activity Tolerance: Treatment limited secondary to medical complications (Comment)(dizziness, nausea, and dry heaves) Patient left: in bed;with call bell/phone within reach;with chair alarm set Nurse Communication: Mobility status;Other (comment)(dizziness and nausea) PT Visit Diagnosis: Unsteadiness on feet (R26.81);Other abnormalities of gait and mobility (R26.89)     Time: 8003-4917 PT Time Calculation (min) (ACUTE ONLY): 28 min  Charges:  $Therapeutic Activity: 23-37 mins                     [place] Pager [phone] Office [place] 06/12/2019, 2:09 PM

## 2019-06-12 NOTE — Progress Notes (Signed)
PROGRESS NOTE    Thomas [name]  DUK:[id number] DOB: [birth year] DOA: 06/07/2019 PCP: Medicine, [facility]   Brief Narrative: 79 y.o. 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  Patient symptoms began on 06/03/2019 with loose stools and generalized weakness.  He also went on to develop increase in his chronic dyspnea and had a negative rapid Covid test on 06/05/2019.  He denies any abdominal pain or chest pain. Wife reports that his chronic jerking movements have worsened over the past week.   Evaluation in the ED patient was found to be hypoxic, oxygen sat in 90s on his usual 2 to 3 L of oxygen, chest x-ray consistent with fluid overload interstitial edema possible pneumonia.  Covid antigen test positive.   Assessment & Plan:   Principal Problem:   Pneumonia Active Problems:   Insulin-requiring or dependent type II diabetes mellitus ([place])   Diarrhea of presumed infectious origin   End stage renal disease (HCC)   Sepsis ([place])   Thrombocytopenia ([place])   Chronic respiratory failure with hypoxia (HCC)   COPD (chronic obstructive pulmonary disease) (HCC)   HTN (hypertension)   COVID-19 virus infection   Pneumonia due to COVID-19 virus   1-Acute on chronic hypoxic respiratory failure, worsening secondary to COVID-19 pneumonia: -Patient presented with weakness, increased shortness of breath chest x-ray with possible pneumonia. -Procalcitonin elevated; started on ceftriaxone and azithromycin, which will be continued until 1/8 -Continue with Decadron completed remdesivir. -Oxygen requirement increased again to 15 L/min via face mask working on  weaning down. He reports having O2 at home for night use but not during the day.     COVID-19 Labs  Recent Labs    06/10/19 0400 06/11/19 0512 06/12/19 1049  DDIMER 1.70* 1.80* 2.73*  FERRITIN 1,157* 968* 836*  CRP 11.3* 7.9* 6.9*    Lab Results  Component Value Date   SARSCOV2NAA POSITIVE (A) 06/07/2019   [place] NEGATIVE 04/10/2019   SARSCOV2NAA NOT DETECTED 01/24/2019   [place] NEGATIVE 01/21/2019    2-COPD with chronic Hypoxic Respiratory Failure.  -On 2-3 L chronic oxygen supplementation at home hs.  -Continue oxygen supplementation.  -Continue Dexamethasone.  -Continue Combivent.  [place] Kitchen  3-ESRD on HD;  Nephrology consulted.  HD per their recs  4-Thrombocytopenia;  Related to acute illness.  Follow trend.   DM insulin Dependent, poorly controlled.  A1c 7.9.  Continue SSI.   Myoclonus;  He has chronic tremors, worse lately.  Seems to be improving.  Hold gabapentin  HNT; Hold BP medications, SBP soft.   Metabolic acidosis;  Continue newly started sodium bicarb. Hopefully improve with HD.   Physical debility.  Likely due to his acute illness. Seen by PT with recs for SNF. Will place CM consult.    Estimated body mass index is 28.32 kg/m as calculated from the following:   Height as of 04/14/19: 5\' 11"  (1.803 m).   Weight as of this encounter: 92.1 kg.   DVT prophylaxis: Heparin  Code Status: full code Family Communication:  Patient at the bedside Disposition Plan: Remains in the hospital for treatment of PNA in setting of covid Consultants:   Nephrology  Procedures:   none  Antimicrobials:    Subjective: His O2 levels fluctuate, remains on 15/L O2 via  face mask but reports feeling better. He feels weak.   Objective: Vitals:   06/12/19 0757 06/12/19 1127 06/12/19 1325 06/12/19 1607  BP: (!) 142/58   (!) 156/64  Pulse:      Resp: 20   20  Temp: 97.9 F (36.6 C)   98.1 F (36.7 C)  TempSrc: Oral   Oral  SpO2: 100% 93% 97% 97%    Weight:        Intake/Output Summary (Last 24 hours) at 06/12/2019 1858 Last data filed at 06/12/2019 0252 Gross per 24 hour  Intake --  Output 2500 ml  Net -2500 ml   Filed Weights   06/11/19 0544 06/12/19 0015 06/12/19 0252  Weight: 93.4 kg 95 kg 92.1 kg    Examination:  General exam; NAD Respiratory system:  no wheezing, no respiratory distress Cardiovascular system: S 1, S 2 RRR Gastrointestinal system: Soft, NT, ND Central nervous system: alert, oriented to person and place Extremities: Symmetric power, no edema Skin: no rashes    Data Reviewed: I have personally reviewed following labs and imaging studies  CBC: Recent Labs  Lab 06/07/19 2226 06/09/19 0804 06/10/19 0400 06/11/19 0512 06/12/19 1049  WBC 5.4 4.9 2.5* 4.2 5.4  NEUTROABS 3.9 3.1 1.8 2.8 3.9  HGB 14.3 12.3* 11.8* 12.1* 12.6*  HCT 49.8 39.0 36.9* 38.4* 39.8  MCV 94.7 87.6 85.0 86.1 85.4  PLT 117* 147* 166 161 097   Basic Metabolic Panel: Recent Labs  Lab 06/07/19 2330 06/09/19 0804 06/10/19 0400 06/11/19 0512 06/12/19 1231  NA 134* 142 140 140 136  K 4.8 5.0 4.7 4.6 4.3  CL 94* 95* 96* 96* 92*  CO2 16* 16* 17* 15* 20*  GLUCOSE 179* 107* 333* 187* 146*  BUN 88* 122* 105* 124* 90*  CREATININE 13.68* 16.47* 14.07* 14.94* 12.17*  CALCIUM 7.1* 6.9* 7.1* 7.4* 8.0*   GFR: Estimated Creatinine Clearance: 5.8 mL/min (A) (by C-G formula based on SCr of 12.17 mg/dL (H)). Liver Function Tests: Recent Labs  Lab 06/07/19 2330 06/09/19 0804 06/10/19 0400 06/11/19 0512 06/12/19 1231  AST 44* 47* 40 33 32  ALT 44 39 38 34 32  ALKPHOS 102 95 98 87 95  BILITOT 0.4 0.4 0.5 0.5 0.6  PROT 6.9 6.6 6.4* 6.4* 6.4*  ALBUMIN 2.8* 2.7* 2.5* 2.6* 2.6*   No results for input(s): LIPASE, AMYLASE in the last 168 hours. No results for input(s): AMMONIA in the last 168 hours. Coagulation Profile: No results for input(s): INR, PROTIME in the last 168 hours. Cardiac Enzymes: No results for input(s):  CKTOTAL, CKMB, CKMBINDEX, TROPONINI in the last 168 hours. BNP (last 3 results) No results for input(s): PROBNP in the last 8760 hours. HbA1C: No results for input(s): HGBA1C in the last 72 hours. CBG: Recent Labs  Lab 06/11/19 1608 06/11/19 2043 06/12/19 0754 06/12/19 1125 06/12/19 1612  GLUCAP 200* 163* 78 106* 230*   Lipid Profile: No results for input(s): CHOL, HDL, LDLCALC, TRIG, CHOLHDL, LDLDIRECT in the last 72 hours. Thyroid Function Tests: No results for input(s): TSH, T4TOTAL, FREET4, T3FREE, THYROIDAB in the last 72 hours. Anemia Panel: Recent Labs    06/11/19 0512 06/12/19 1049  FERRITIN 968* 836*   Sepsis Labs: Recent Labs  Lab 06/07/19 2226 06/07/19 2330  PROCALCITON  --  2.85  LATICACIDVEN 1.7  --     Recent Results (from the past 240 hour(s))  Blood Culture (routine x 2)     Status: None   Collection Time: 06/07/19 10:26 PM  Specimen: BLOOD RIGHT HAND  Result Value Ref Range Status   Specimen Description BLOOD RIGHT HAND  Final   Special Requests   Final    BOTTLES DRAWN AEROBIC AND ANAEROBIC Blood Culture results may not be optimal due to an inadequate volume of blood received in culture bottles   Culture   Final    NO GROWTH 5 DAYS Performed at [facility], [place] [address] [place], [place] [address]    Report Status 06/12/2019 FINAL  Final  SARS CORONAVIRUS 2 (TAT 6-24 HRS) Nasopharyngeal Nasopharyngeal Swab     Status: Abnormal   Collection Time: 06/07/19 10:30 PM   Specimen: Nasopharyngeal Swab  Result Value Ref Range Status   SARS Coronavirus 2 POSITIVE (A) NEGATIVE Final    Comment: RESULT CALLED TO, READ BACK BY AND VERIFIED WITH: K. MOON,RN 0340 06/08/2019 T. TYSOR (NOTE) SARS-CoV-2 target nucleic acids are DETECTED. The SARS-CoV-2 RNA is generally detectable in upper and lower respiratory specimens during the acute phase of infection. Positive results are indicative of the presence of SARS-CoV-2 RNA. Clinical correlation  with patient history and other diagnostic information is  necessary to determine patient infection status. Positive results do not rule out bacterial infection or co-infection with other viruses.  The expected result is Negative. Fact Sheet for Patients: SugarRoll.be Fact Sheet for Healthcare Providers: https://www.woods-mathews.com/ This test is not yet approved or cleared by the [place] FDA and  has been authorized for detection and/or diagnosis of SARS-CoV-2 by FDA under an Emergency Use Authorization (EUA). This EUA will remain  in effect (meaning this test can be used) for the  duration of the COVID-19 declaration under Section 564(b)(1) of the Act, 21 U.S.C. section 360bbb-3(b)(1), unless the authorization is terminated or revoked sooner. Performed at [facility], [place] [address] [place], [place] [address]   Blood Culture (routine x 2)     Status: None (Preliminary result)   Collection Time: 06/08/19 12:20 AM   Specimen: BLOOD  Result Value Ref Range Status   Specimen Description BLOOD RIGHT ANTECUBITAL  Final   Special Requests   Final    BOTTLES DRAWN AEROBIC AND ANAEROBIC Blood Culture results may not be optimal due to an inadequate volume of blood received in culture bottles   Culture   Final    NO GROWTH 4 DAYS Performed at [facility], [place] [address], [place], [place] [address]    Report Status PENDING  Incomplete         Radiology Studies: No results found.      Scheduled Meds: . Chlorhexidine Gluconate Cloth  6 each Topical Q0600  . dexamethasone (DECADRON) injection  6 mg Intravenous Daily  . insulin aspart  0-5 Units Subcutaneous QHS  . insulin aspart  0-9 Units Subcutaneous TID WC  . insulin aspart  2 Units Subcutaneous TID WC  . insulin glargine  8 Units Subcutaneous BID  . Ipratropium-Albuterol  1 puff Inhalation Q6H  . isosorbide mononitrate  30 mg Oral Daily  . magnesium oxide  400 mg  Oral QHS  . mometasone-formoterol  2 puff Inhalation BID  . sodium bicarbonate  1,300 mg Oral BID  . sodium chloride flush  3 mL Intravenous Q12H  . vitamin B-12  1,000 mcg Oral QPM   Continuous Infusions: . sodium chloride    . sodium chloride    . azithromycin 250 mL/hr at 06/12/19 0644  . cefTRIAXone (ROCEPHIN)  IV 2 g (06/12/19 0854)  LOS: 4 days    Time spent: 35 minutes.     Blain Pais, MD [facility]   If 7PM-7AM, please contact night-coverage [email or web address] Password [facility] 06/12/2019, 6:58 PM

## 2019-06-12 NOTE — Progress Notes (Signed)
Occupational Therapy Treatment Patient Details Name: Thomas [name] MRN: [id number] DOB: 1940/10/13 Today's Date: 06/12/2019    History of present illness 79 yo male admitted with COVID + acute hypoxic respiratory failure, hypertension, anemia and metabolic bone disease PMH ESRD on HD MWF COPD 3L at home, CAD, Neuropathy, nose cancer with nose amputation DM    OT comments  Patient supine in bed, reports incontinent of bowel upon entry. Total assist for toileting hygiene with increased redness and soreness on buttocks; RN notified and aware.  Patient rolling in bed with min guard and attempted sitting EOB with min assist but unable to tolerate due to buttocks pain.  Mod assist to change into new gown.  On 15L oxygen via face tent and SpO2 maintained throughout session. Will follow acutely, updated dc plan to SNF.    Follow Up Recommendations  SNF;Supervision/Assistance - 24 hour    Equipment Recommendations  3 in 1 bedside commode    Recommendations for Other Services      Precautions / Restrictions Precautions Precautions: Fall Restrictions Weight Bearing Restrictions: No       Mobility Bed Mobility Overal bed mobility: Needs Assistance Bed Mobility: Rolling;Sidelying to Sit Rolling: Min guard Sidelying to sit: Min assist Supine to sit: Min assist;HOB elevated Sit to supine: Min guard   General bed mobility comments: patient rolling in bed with min guard for hygiene and linen change after incontinent BM, attempted transition to EOB but unable to sustain sitting EOB due to raw/sore/red buttocks  Transfers                 General transfer comment: Unable to assess due to dizziness and nausea.    Balance Overall balance assessment: Needs assistance Sitting-balance support: No upper extremity supported;Feet supported Sitting balance-Leahy Scale: Good                                     ADL either performed or assessed with clinical judgement   ADL  Overall ADL's : Needs assistance/impaired                 Upper Body Dressing : Moderate assistance;Bed level Upper Body Dressing Details (indicate cue type and reason): donning new gown          Toileting- Clothing Manipulation and Hygiene: Total assistance;Bed level         General ADL Comments: pt incontinent of bowel upon entry, rolling in bed with min guard for safety and attempting to sit EOB but unable due to buttocks pain      Vision       Perception     Praxis      Cognition Arousal/Alertness: Awake/alert Behavior During Therapy: WFL for tasks assessed/performed Overall Cognitive Status: Within Functional Limits for tasks assessed                                 General Comments: talkative and needed redirection         Exercises     Shoulder Instructions       General Comments RN notified of redness to buttocks and pain     Pertinent Vitals/ Pain       Pain Assessment: Faces Faces Pain Scale: Hurts even more Pain Location: buttocks  Pain Descriptors / Indicators: Discomfort;Grimacing;Moaning Pain Intervention(s): Monitored during session;Repositioned  Home Living  Prior Functioning/Environment              Frequency  Min 2X/week        Progress Toward Goals  OT Goals(current goals can now be found in the care plan section)  Progress towards OT goals: Not progressing toward goals - comment(pain, loose bowels)  Acute Rehab OT Goals Patient Stated Goal: go home OT Goal Formulation: With patient  Plan Frequency remains appropriate;Discharge plan needs to be updated    Co-evaluation                 AM-PAC OT "6 Clicks" Daily Activity     Outcome Measure   Help from another person eating meals?: A Little Help from another person taking care of personal grooming?: A Little Help from another person toileting, which includes using toliet, bedpan, or  urinal?: Total Help from another person bathing (including washing, rinsing, drying)?: Total Help from another person to put on and taking off regular upper body clothing?: A Lot Help from another person to put on and taking off regular lower body clothing?: Total 6 Click Score: 11    End of Session Equipment Utilized During Treatment: Oxygen(15L face tent )  OT Visit Diagnosis: Unsteadiness on feet (R26.81);Muscle weakness (generalized) (M62.81)   Activity Tolerance Patient limited by pain;Patient limited by fatigue   Patient Left in bed;with call bell/phone within reach;with bed alarm set   Nurse Communication Mobility status        Time: 1400-1430 OT Time Calculation (min): 30 min  Charges: OT General Charges $OT Visit: 1 Visit OT Treatments $Self Care/Home Management : 23-37 mins  [place] Pager [phone] Office [phone]     Delight Stare 06/12/2019, 2:41 PM

## 2019-06-12 NOTE — Progress Notes (Signed)
Patient ID: Thomas [name], male   DOB: 05-09-1941, 79 y.o.   MRN: [id number] S: No overnight events by chart review but still on 15L  Tol 2.5L UF with HD yesterday  O:BP (!) 142/58 (BP Location: Right Leg)   Pulse 90   Temp 97.9 F (36.6 C) (Oral)   Resp 20   Wt 92.1 kg   SpO2 100%   BMI 28.32 kg/m   Intake/Output Summary (Last 24 hours) at 06/12/2019 1126 Last data filed at 06/12/2019 0252 Gross per 24 hour  Intake --  Output 2500 ml  Net -2500 ml   Intake/Output: I/O last 3 completed shifts: In: -  Out: 2500 [Other:2500]  Intake/Output this shift:  No intake/output data recorded. Weight change: 1.65 kg Physical exam: did not complete due to COVID + status.  In order to preserve PPE equipment and to minimize exposure to providers.  Notes from other caregivers reviewed   Recent Labs  Lab 06/07/19 2330 06/09/19 0804 06/10/19 0400 06/11/19 0512  NA 134* 142 140 140  K 4.8 5.0 4.7 4.6  CL 94* 95* 96* 96*  CO2 16* 16* 17* 15*  GLUCOSE 179* 107* 333* 187*  BUN 88* 122* 105* 124*  CREATININE 13.68* 16.47* 14.07* 14.94*  ALBUMIN 2.8* 2.7* 2.5* 2.6*  CALCIUM 7.1* 6.9* 7.1* 7.4*  AST 44* 47* 40 33  ALT 44 39 38 34   Liver Function Tests: Recent Labs  Lab 06/09/19 0804 06/10/19 0400 06/11/19 0512  AST 47* 40 33  ALT 39 38 34  ALKPHOS 95 98 87  BILITOT 0.4 0.5 0.5  PROT 6.6 6.4* 6.4*  ALBUMIN 2.7* 2.5* 2.6*   No results for input(s): LIPASE, AMYLASE in the last 168 hours. No results for input(s): AMMONIA in the last 168 hours. CBC: Recent Labs  Lab 06/07/19 2226 06/09/19 0804 06/10/19 0400 06/11/19 0512 06/12/19 1049  WBC 5.4 4.9 2.5* 4.2 5.4  NEUTROABS 3.9 3.1 1.8 2.8 3.9  HGB 14.3 12.3* 11.8* 12.1* 12.6*  HCT 49.8 39.0 36.9* 38.4* 39.8  MCV 94.7 87.6 85.0 86.1 85.4  PLT 117* 147* 166 161 181   Cardiac Enzymes: No results for input(s): CKTOTAL, CKMB, CKMBINDEX, TROPONINI in the last 168 hours. CBG: Recent Labs  Lab 06/11/19 0737 06/11/19 1113  06/11/19 1608 06/11/19 2043 06/12/19 0754  GLUCAP 127* 107* 200* 163* 78    Iron Studies:  Recent Labs    06/11/19 0512  FERRITIN 968*   Studies/Results: No results found. . Chlorhexidine Gluconate Cloth  6 each Topical Q0600  . dexamethasone (DECADRON) injection  6 mg Intravenous Daily  . insulin aspart  0-5 Units Subcutaneous QHS  . insulin aspart  0-9 Units Subcutaneous TID WC  . insulin aspart  2 Units Subcutaneous TID WC  . insulin glargine  8 Units Subcutaneous BID  . Ipratropium-Albuterol  1 puff Inhalation Q6H  . isosorbide mononitrate  30 mg Oral Daily  . magnesium oxide  400 mg Oral QHS  . mometasone-formoterol  2 puff Inhalation BID  . sodium bicarbonate  1,300 mg Oral BID  . sodium chloride flush  3 mL Intravenous Q12H  . vitamin B-12  1,000 mcg Oral QPM    BMET    Component Value Date/Time   NA 140 06/11/2019 0512   NA 137 06/30/2012 1305   K 4.6 06/11/2019 0512   K 4.1 06/30/2012 1305   CL 96 (L) 06/11/2019 0512   CL 106 06/30/2012 1305   CO2 15 (L) 06/11/2019 8466  CO2 24 06/30/2012 1305   GLUCOSE 187 (H) 06/11/2019 0512   BUN 124 (H) 06/11/2019 0512   BUN 62 (A) 06/30/2012 1305   CREATININE 14.94 (H) 06/11/2019 0512   CREATININE 4.09 (H) 07/23/2012 0941   CALCIUM 7.4 (L) 06/11/2019 0512   CALCIUM 8.1 06/30/2012 1305   GFRNONAA 3 (L) 06/11/2019 0512   GFRAA 3 (L) 06/11/2019 0512   CBC    Component Value Date/Time   WBC 5.4 06/12/2019 1049   RBC 4.66 06/12/2019 1049   HGB 12.6 (L) 06/12/2019 1049   HCT 39.8 06/12/2019 1049   HCT 32 06/29/2012 1308   PLT 181 06/12/2019 1049   MCV 85.4 06/12/2019 1049   MCH 27.0 06/12/2019 1049   MCHC 31.7 06/12/2019 1049   RDW 16.2 (H) 06/12/2019 1049   LYMPHSABS 0.9 06/12/2019 1049   MONOABS 0.4 06/12/2019 1049   EOSABS 0.0 06/12/2019 1049   BASOSABS 0.0 06/12/2019 1049    Dialysis Access: Left AVF  Dialysis Orders: Center: [facility]  on MWF . EDW 94.5kg HD [place] 1K/2.5Ca  Time 4 hours Heparin  1600units IVP bolus then 1400 units/hr. Access LAVF BFR 350 DFR 600    Hectoral 4.5 mcg IV/HD Venofer  50 mg weekly    Assessment/Plan: 1.  Acute hypoxic respiratory failure due to Covid-19 pneumonia- started on rocephin, azithromycin in ED as well as decadron and remdesivir. 1. Requiring 15 liters high flow oxygen still as of this AM 2.  ESRD -  Plan for HD today in his room tomorrow if he remains on 15L --> when oxygenation improved will travel for HD.  Will UF as tolerated to help with his high oxygen demands.   3.  Hypertension/volume  - normotensive now.  Follow with UF 4.  Anemia  - No esa due to Hgb >12 5.  Metabolic bone disease -  Cont with home meds 6.  Nutrition -  Renal diet. 7. DM- per primary svc. 8. COPD- was on home o2 prior to admission.

## 2019-06-13 LAB — CBC WITH DIFFERENTIAL/PLATELET
Abs Immature Granulocytes: 0.17 10*3/uL — ABNORMAL HIGH (ref 0.00–0.07)
Basophils Absolute: 0 10*3/uL (ref 0.0–0.1)
Basophils Relative: 1 %
Eosinophils Absolute: 0 10*3/uL (ref 0.0–0.5)
Eosinophils Relative: 0 %
HCT: 37.3 % — ABNORMAL LOW (ref 39.0–52.0)
Hemoglobin: 11.8 g/dL — ABNORMAL LOW (ref 13.0–17.0)
Immature Granulocytes: 4 %
Lymphocytes Relative: 19 %
Lymphs Abs: 0.8 10*3/uL (ref 0.7–4.0)
MCH: 27.2 pg (ref 26.0–34.0)
MCHC: 31.6 g/dL (ref 30.0–36.0)
MCV: 85.9 fL (ref 80.0–100.0)
Monocytes Absolute: 0.4 10*3/uL (ref 0.1–1.0)
Monocytes Relative: 11 %
Neutro Abs: 2.6 10*3/uL (ref 1.7–7.7)
Neutrophils Relative %: 65 %
Platelets: 168 10*3/uL (ref 150–400)
RBC: 4.34 MIL/uL (ref 4.22–5.81)
RDW: 15.9 % — ABNORMAL HIGH (ref 11.5–15.5)
WBC: 4 10*3/uL (ref 4.0–10.5)
nRBC: 0 % (ref 0.0–0.2)

## 2019-06-13 LAB — C-REACTIVE PROTEIN
CRP: 8.3 mg/dL — ABNORMAL HIGH (ref <1.0)
CRP: 7.5 mg/dL — ABNORMAL HIGH (ref <1.0)

## 2019-06-13 LAB — COMPREHENSIVE METABOLIC PANEL
ALT: 31 U/L (ref 0–44)
AST: 28 U/L (ref 15–41)
Albumin: 2.6 g/dL — ABNORMAL LOW (ref 3.5–5.0)
Alkaline Phosphatase: 95 U/L (ref 38–126)
Anion gap: 26 — ABNORMAL HIGH (ref 5–15)
BUN: 106 mg/dL — ABNORMAL HIGH (ref 8–23)
CO2: 19 mmol/L — ABNORMAL LOW (ref 22–32)
Calcium: 8.5 mg/dL — ABNORMAL LOW (ref 8.9–10.3)
Chloride: 95 mmol/L — ABNORMAL LOW (ref 98–111)
Creatinine, Ser: 13.52 mg/dL — ABNORMAL HIGH (ref 0.61–1.24)
GFR calc Af Amer: 4 mL/min — ABNORMAL LOW (ref >60)
GFR calc non Af Amer: 3 mL/min — ABNORMAL LOW (ref >60)
Glucose, Bld: 110 mg/dL — ABNORMAL HIGH (ref 70–99)
Potassium: 4.6 mmol/L (ref 3.5–5.1)
Sodium: 140 mmol/L (ref 135–145)
Total Bilirubin: 0.5 mg/dL (ref 0.3–1.2)
Total Protein: 6.6 g/dL (ref 6.5–8.1)

## 2019-06-13 LAB — GLUCOSE, CAPILLARY
Glucose-Capillary: 92 mg/dL (ref 70–99)
Glucose-Capillary: 88 mg/dL (ref 70–99)
Glucose-Capillary: 186 mg/dL — ABNORMAL HIGH (ref 70–99)
Glucose-Capillary: 167 mg/dL — ABNORMAL HIGH (ref 70–99)

## 2019-06-13 LAB — D-DIMER, QUANTITATIVE: D-Dimer, Quant: 2.81 ug/mL-FEU — ABNORMAL HIGH (ref 0.00–0.50)

## 2019-06-13 LAB — FERRITIN: Ferritin: 644 ng/mL — ABNORMAL HIGH (ref 24–336)

## 2019-06-13 LAB — CULTURE, BLOOD (ROUTINE X 2): Culture: NO GROWTH

## 2019-06-13 MED ORDER — MIDODRINE HCL 5 MG PO TABS: 10.0000 mg | ORAL_TABLET | Freq: Once | ORAL | Status: DC

## 2019-06-13 MED ORDER — LOPERAMIDE HCL 2 MG PO CAPS
4.0000 mg | ORAL_CAPSULE | Freq: Once | ORAL | Status: AC
  Administered 2019-06-13: 4 mg via ORAL
  Filled 2019-06-13: qty 2

## 2019-06-13 MED ORDER — ONDANSETRON HCL 4 MG/2ML IJ SOLN
4.0000 mg | Freq: Four times a day (QID) | INTRAMUSCULAR | Status: DC | PRN
  Administered 2019-06-13: 4 mg via INTRAVENOUS
  Filled 2019-06-13: qty 2

## 2019-06-13 NOTE — Progress Notes (Signed)
The patient was having dry heaves and frequent loose stools. Notified Bodenheimer NP and recevieved orders for imodium oral  and Zofran IV as noted in MAR. Orders already implemented. Will continue to monitor.

## 2019-06-13 NOTE — Progress Notes (Signed)
Patient ID: Thomas [name], male   DOB: [birth year], 79 y.o.   MRN: [id number] S: No overnight events by chart review and weaned to 9L with sats 92%. Due for HD today  O:BP (!) 145/49   Pulse 63   Temp (!) 97.4 F (36.3 C) (Oral)   Resp 20   Wt 91.5 kg   SpO2 92%   BMI 28.13 kg/m   Intake/Output Summary (Last 24 hours) at 06/13/2019 1546 Last data filed at 06/13/2019 0500 Gross per 24 hour  Intake 350 ml  Output -  Net 350 ml   Intake/Output: I/O last 3 completed shifts: In: 350 [IV Piggyback:350] Out: 2500 [Other:2500]  Intake/Output this shift:  No intake/output data recorded. Weight change: -3.5 kg Physical exam: did not complete due to COVID + status.  In order to preserve PPE equipment and to minimize exposure to providers.  Notes from other caregivers reviewed   Recent Labs  Lab 06/07/19 2330 06/09/19 0804 06/10/19 0400 06/11/19 0512 06/12/19 1231 06/13/19 0533  NA 134* 142 140 140 136 140  K 4.8 5.0 4.7 4.6 4.3 4.6  CL 94* 95* 96* 96* 92* 95*  CO2 16* 16* 17* 15* 20* 19*  GLUCOSE 179* 107* 333* 187* 146* 110*  BUN 88* 122* 105* 124* 90* 106*  CREATININE 13.68* 16.47* 14.07* 14.94* 12.17* 13.52*  ALBUMIN 2.8* 2.7* 2.5* 2.6* 2.6* 2.6*  CALCIUM 7.1* 6.9* 7.1* 7.4* 8.0* 8.5*  AST 44* 47* 40 33 32 28  ALT 44 39 38 34 32 31   Liver Function Tests: Recent Labs  Lab 06/11/19 0512 06/12/19 1231 06/13/19 0533  AST 33 32 28  ALT 34 32 31  ALKPHOS 87 95 95  BILITOT 0.5 0.6 0.5  PROT 6.4* 6.4* 6.6  ALBUMIN 2.6* 2.6* 2.6*   No results for input(s): LIPASE, AMYLASE in the last 168 hours. No results for input(s): AMMONIA in the last 168 hours. CBC: Recent Labs  Lab 06/09/19 0804 06/10/19 0400 06/11/19 0512 06/12/19 1049 06/13/19 0533  WBC 4.9 2.5* 4.2 5.4 4.0  NEUTROABS 3.1 1.8 2.8 3.9 2.6  HGB 12.3* 11.8* 12.1* 12.6* 11.8*  HCT 39.0 36.9* 38.4* 39.8 37.3*  MCV 87.6 85.0 86.1 85.4 85.9  PLT 147* 166 161 181 168   Cardiac Enzymes: No results for  input(s): CKTOTAL, CKMB, CKMBINDEX, TROPONINI in the last 168 hours. CBG: Recent Labs  Lab 06/12/19 1125 06/12/19 1612 06/12/19 2036 06/13/19 0638 06/13/19 1159  GLUCAP 106* 230* 147* 92 88    Iron Studies:  Recent Labs    06/13/19 0533  FERRITIN 644*   Studies/Results: No results found. . Chlorhexidine Gluconate Cloth  6 each Topical Q0600  . dexamethasone (DECADRON) injection  6 mg Intravenous Daily  . insulin aspart  0-5 Units Subcutaneous QHS  . insulin aspart  0-9 Units Subcutaneous TID WC  . insulin aspart  2 Units Subcutaneous TID WC  . insulin glargine  8 Units Subcutaneous BID  . Ipratropium-Albuterol  1 puff Inhalation Q6H  . isosorbide mononitrate  30 mg Oral Daily  . magnesium oxide  400 mg Oral QHS  . mometasone-formoterol  2 puff Inhalation BID  . sodium bicarbonate  1,300 mg Oral BID  . sodium chloride flush  3 mL Intravenous Q12H  . vitamin B-12  1,000 mcg Oral QPM    BMET    Component Value Date/Time   NA 140 06/13/2019 0533   NA 137 06/30/2012 1305   K 4.6 06/13/2019 0533   K  4.1 06/30/2012 1305   CL 95 (L) 06/13/2019 0533   CL 106 06/30/2012 1305   CO2 19 (L) 06/13/2019 0533   CO2 24 06/30/2012 1305   GLUCOSE 110 (H) 06/13/2019 0533   BUN 106 (H) 06/13/2019 0533   BUN 62 (A) 06/30/2012 1305   CREATININE 13.52 (H) 06/13/2019 0533   CREATININE 4.09 (H) 07/23/2012 0941   CALCIUM 8.5 (L) 06/13/2019 0533   CALCIUM 8.1 06/30/2012 1305   GFRNONAA 3 (L) 06/13/2019 0533   GFRAA 4 (L) 06/13/2019 0533   CBC    Component Value Date/Time   WBC 4.0 06/13/2019 0533   RBC 4.34 06/13/2019 0533   HGB 11.8 (L) 06/13/2019 0533   HCT 37.3 (L) 06/13/2019 0533   HCT 32 06/29/2012 1308   PLT 168 06/13/2019 0533   MCV 85.9 06/13/2019 0533   MCH 27.2 06/13/2019 0533   MCHC 31.6 06/13/2019 0533   RDW 15.9 (H) 06/13/2019 0533   LYMPHSABS 0.8 06/13/2019 0533   MONOABS 0.4 06/13/2019 0533   EOSABS 0.0 06/13/2019 0533   BASOSABS 0.0 06/13/2019 0533     Dialysis Access: Left AVF  Dialysis Orders: Center: [facility]  on MWF . EDW 94.5kg HD [place] 1K/2.5Ca  Time 4 hours Heparin 1600units IVP bolus then 1400 units/hr. Access LAVF BFR 350 DFR 600    Hectoral 4.5 mcg IV/HD Venofer  50 mg weekly    Assessment/Plan: 1.  Acute hypoxic respiratory failure due to Covid-19 pneumonia- started on rocephin, azithromycin in ED as well as decadron and remdesivir. 1. Weaned to 9L now but O2 sat 92% so will still likely need separate 2.  ESRD -   Will UF as tolerated to help with his high oxygen demands. HD tonight.  3.  Hypertension/volume  - normotensive now.  Follow with UF 4.  Anemia  - No esa due to Hgb >12 5.  Metabolic bone disease -  Cont with home meds 6.  Nutrition -  Renal diet. 7. DM- per primary svc. 8. COPD- was on home o2 prior to admission.

## 2019-06-13 NOTE — Progress Notes (Signed)
PROGRESS NOTE    Thomas [name]  SPQ:[id number] DOB: 1941/03/28 DOA: 06/07/2019 PCP: [facility]   Brief Narrative: 79 y.o. 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  Patient symptoms began on 06/03/2019 with loose stools and generalized weakness.  He also went on to develop increase in his chronic dyspnea and had a negative rapid Covid test on 06/05/2019.  He denies any abdominal pain or chest pain. Wife reports that his chronic jerking movements have worsened over the past week.   Evaluation in the ED patient was found to be hypoxic, oxygen sat in 90s on his usual 2 to 3 L of oxygen, chest x-ray consistent with fluid overload interstitial edema possible pneumonia.  Covid antigen test positive.   Assessment & Plan:   Principal Problem:   Pneumonia Active Problems:   Insulin-requiring or dependent type II diabetes mellitus ([place])   Diarrhea of presumed infectious origin   End stage renal disease (HCC)   Sepsis ([place])   Thrombocytopenia ([place])   Chronic respiratory failure with hypoxia (HCC)   COPD (chronic obstructive pulmonary disease) (HCC)   HTN (hypertension)   COVID-19 virus infection   Pneumonia due to COVID-19 virus   1-Acute on chronic hypoxic respiratory failure, worsening secondary to COVID-19 pneumonia: -Patient presented with weakness, increased shortness of breath chest x-ray with possible pneumonia. -Procalcitonin elevated; started on ceftriaxone and azithromycin, which will be continued until 1/8 -Continue with Decadron completed remdesivir. -Oxygen requirement increased again to 15 L/min via face mask working on  weaning down. He reports having O2 at home for night use but not during the day.     COVID-19 Labs  Recent Labs    06/11/19 0512 06/12/19 1049 06/13/19 0533 06/13/19 1039  DDIMER 1.80* 2.73* 2.81*  --   FERRITIN 968* 836* 644*  --   CRP 7.9* 6.9* 8.3* 7.5*    Lab Results  Component Value Date   SARSCOV2NAA POSITIVE (A) 06/07/2019   [place] NEGATIVE 04/10/2019   SARSCOV2NAA NOT DETECTED 01/24/2019   [place] NEGATIVE 01/21/2019    2-COPD with chronic Hypoxic Respiratory Failure.  -On 2-3 L chronic oxygen supplementation at home hs.  -Continue oxygen supplementation.  -Continue Dexamethasone.  -Continue Combivent.  [place] Kitchen  3-ESRD on HD;  Nephrology consulted.  HD per their recs  4-Thrombocytopenia;  Related to acute illness.  Follow trend.  Monitor for bleeding.   DM insulin Dependent, poorly controlled.  A1c 7.9.  Continue SSI.   Myoclonus;  He has chronic tremors, worse lately.  Seems to be improving.  Hold gabapentin  HNT; Hold BP medications, SBP soft.   Metabolic acidosis;  Continue newly started sodium bicarb. Hopefully improve with HD.   Physical debility.  Likely due to his acute illness. Seen by PT with recs for SNF. CM consult placed.    Loose stools.  Resolved. Denies abdominal pain.   Estimated body mass index is 28.13 kg/m as calculated from the following:   Height as of 04/14/19: 5\' 11"  (1.803 m).   Weight as of this encounter: 91.5 kg.   DVT prophylaxis: Heparin  Code Status: full code Family Communication:  Patient at the bedside Disposition Plan: Remains in the hospital for treatment of PNA in setting of covid Consultants:  Nephrology  Procedures:   none  Antimicrobials:    Subjective: He is tired, did not sleep well last night due to having loose stools which have improved. Denies abdominal pain.   Objective: Vitals:   06/12/19 2300 06/13/19 0533 06/13/19 0748 06/13/19 1635  BP: (!) 147/52  (!) 145/49 (!) 148/52    Pulse:   63 69  Resp: 20   16  Temp:   (!) 97.4 F (36.3 C) 97.9 F (36.6 C)  TempSrc:   Oral Oral  SpO2:   92% 90%  Weight:  91.5 kg      Intake/Output Summary (Last 24 hours) at 06/13/2019 1831 Last data filed at 06/13/2019 0500 Gross per 24 hour  Intake 350 ml  Output --  Net 350 ml   Filed Weights   06/12/19 0015 06/12/19 0252 06/13/19 0533  Weight: 95 kg 92.1 kg 91.5 kg    Examination:  General exam; NAD Respiratory system:  no wheezing, no respiratory distress Cardiovascular system: S 1, S 2 RRR Gastrointestinal system: Soft, NT, ND Central nervous system: alert, oriented to person and place Extremities: Symmetric power, no edema Skin: no rashes    Data Reviewed: I have personally reviewed following labs and imaging studies  CBC: Recent Labs  Lab 06/09/19 0804 06/10/19 0400 06/11/19 0512 06/12/19 1049 06/13/19 0533  WBC 4.9 2.5* 4.2 5.4 4.0  NEUTROABS 3.1 1.8 2.8 3.9 2.6  HGB 12.3* 11.8* 12.1* 12.6* 11.8*  HCT 39.0 36.9* 38.4* 39.8 37.3*  MCV 87.6 85.0 86.1 85.4 85.9  PLT 147* 166 161 181 329   Basic Metabolic Panel: Recent Labs  Lab 06/09/19 0804 06/10/19 0400 06/11/19 0512 06/12/19 1231 06/13/19 0533  NA 142 140 140 136 140  K 5.0 4.7 4.6 4.3 4.6  CL 95* 96* 96* 92* 95*  CO2 16* 17* 15* 20* 19*  GLUCOSE 107* 333* 187* 146* 110*  BUN 122* 105* 124* 90* 106*  CREATININE 16.47* 14.07* 14.94* 12.17* 13.52*  CALCIUM 6.9* 7.1* 7.4* 8.0* 8.5*   GFR: Estimated Creatinine Clearance: 5.2 mL/min (A) (by C-G formula based on SCr of 13.52 mg/dL (H)). Liver Function Tests: Recent Labs  Lab 06/09/19 0804 06/10/19 0400 06/11/19 0512 06/12/19 1231 06/13/19 0533  AST 47* 40 33 32 28  ALT 39 38 34 32 31  ALKPHOS 95 98 87 95 95  BILITOT 0.4 0.5 0.5 0.6 0.5  PROT 6.6 6.4* 6.4* 6.4* 6.6  ALBUMIN 2.7* 2.5* 2.6* 2.6* 2.6*   No results for input(s): LIPASE, AMYLASE in the last 168 hours. No results for input(s): AMMONIA in the last 168  hours. Coagulation Profile: No results for input(s): INR, PROTIME in the last 168 hours. Cardiac Enzymes: No results for input(s): CKTOTAL, CKMB, CKMBINDEX, TROPONINI in the last 168 hours. BNP (last 3 results) No results for input(s): PROBNP in the last 8760 hours. HbA1C: No results for input(s): HGBA1C in the last 72 hours. CBG: Recent Labs  Lab 06/12/19 1612 06/12/19 2036 06/13/19 0638 06/13/19 1159 06/13/19 1632  GLUCAP 230* 147* 92 88 186*   Lipid Profile: No results for input(s): CHOL, HDL, LDLCALC, TRIG, CHOLHDL, LDLDIRECT in the last 72 hours. Thyroid Function Tests: No results for input(s): TSH, T4TOTAL, FREET4, T3FREE, THYROIDAB in the last 72 hours. Anemia Panel: Recent Labs    06/12/19 1049 06/13/19 0533  FERRITIN 836* 644*   Sepsis Labs: Recent Labs  Lab 06/07/19 2226 06/07/19 2330  PROCALCITON  --  2.85  LATICACIDVEN 1.7  --  Recent Results (from the past 240 hour(s))  Blood Culture (routine x 2)     Status: None   Collection Time: 06/07/19 10:26 PM   Specimen: BLOOD RIGHT HAND  Result Value Ref Range Status   Specimen Description BLOOD RIGHT HAND  Final   Special Requests   Final    BOTTLES DRAWN AEROBIC AND ANAEROBIC Blood Culture results may not be optimal due to an inadequate volume of blood received in culture bottles   Culture   Final    NO GROWTH 5 DAYS Performed at [facility], [place] [address] [place], [place] [address]    Report Status 06/12/2019 FINAL  Final  SARS CORONAVIRUS 2 (TAT 6-24 HRS) Nasopharyngeal Nasopharyngeal Swab     Status: Abnormal   Collection Time: 06/07/19 10:30 PM   Specimen: Nasopharyngeal Swab  Result Value Ref Range Status   SARS Coronavirus 2 POSITIVE (A) NEGATIVE Final    Comment: RESULT CALLED TO, READ BACK BY AND VERIFIED WITH: K. MOON,RN 0340 06/08/2019 T. TYSOR (NOTE) SARS-CoV-2 target nucleic acids are DETECTED. The SARS-CoV-2 RNA is generally detectable in upper and lower respiratory  specimens during the acute phase of infection. Positive results are indicative of the presence of SARS-CoV-2 RNA. Clinical correlation with patient history and other diagnostic information is  necessary to determine patient infection status. Positive results do not rule out bacterial infection or co-infection with other viruses.  The expected result is Negative. Fact Sheet for Patients: SugarRoll.be Fact Sheet for Healthcare Providers: https://www.woods-mathews.com/ This test is not yet approved or cleared by the [place] FDA and  has been authorized for detection and/or diagnosis of SARS-CoV-2 by FDA under an Emergency Use Authorization (EUA). This EUA will remain  in effect (meaning this test can be used) for the  duration of the COVID-19 declaration under Section 564(b)(1) of the Act, 21 U.S.C. section 360bbb-3(b)(1), unless the authorization is terminated or revoked sooner. Performed at [facility], [place] [address] [place], [place] [address]   Blood Culture (routine x 2)     Status: None   Collection Time: 06/08/19 12:20 AM   Specimen: BLOOD  Result Value Ref Range Status   Specimen Description BLOOD RIGHT ANTECUBITAL  Final   Special Requests   Final    BOTTLES DRAWN AEROBIC AND ANAEROBIC Blood Culture results may not be optimal due to an inadequate volume of blood received in culture bottles   Culture   Final    NO GROWTH 5 DAYS Performed at [facility], [place] [address], [place], [place] [address]    Report Status 06/13/2019 FINAL  Final         Radiology Studies: No results found.      Scheduled Meds: . Chlorhexidine Gluconate Cloth  6 each Topical Q0600  . dexamethasone (DECADRON) injection  6 mg Intravenous Daily  . insulin aspart  0-5 Units Subcutaneous QHS  . insulin aspart  0-9 Units Subcutaneous TID WC  . insulin aspart  2 Units Subcutaneous TID WC  . insulin glargine  8 Units Subcutaneous BID   . Ipratropium-Albuterol  1 puff Inhalation Q6H  . isosorbide mononitrate  30 mg Oral Daily  . magnesium oxide  400 mg Oral QHS  . mometasone-formoterol  2 puff Inhalation BID  . sodium bicarbonate  1,300 mg Oral BID  . sodium chloride flush  3 mL Intravenous Q12H  . vitamin B-12  1,000 mcg Oral QPM   Continuous Infusions: . sodium chloride    .  sodium chloride    . azithromycin 500 mg (06/13/19 0216)  . cefTRIAXone (ROCEPHIN)  IV 2 g (06/13/19 0234)     LOS: 5 days    Time spent: 25 minutes.     Blain Pais, MD [facility]   If 7PM-7AM, please contact night-coverage [email or web address] Password [facility] 06/13/2019, 6:31 PM

## 2019-06-14 LAB — RENAL FUNCTION PANEL
Albumin: 2.5 g/dL — ABNORMAL LOW (ref 3.5–5.0)
Anion gap: 24 — ABNORMAL HIGH (ref 5–15)
BUN: 122 mg/dL — ABNORMAL HIGH (ref 8–23)
CO2: 20 mmol/L — ABNORMAL LOW (ref 22–32)
Calcium: 8.5 mg/dL — ABNORMAL LOW (ref 8.9–10.3)
Chloride: 97 mmol/L — ABNORMAL LOW (ref 98–111)
Creatinine, Ser: 15.01 mg/dL — ABNORMAL HIGH (ref 0.61–1.24)
GFR calc Af Amer: 3 mL/min — ABNORMAL LOW (ref >60)
GFR calc non Af Amer: 3 mL/min — ABNORMAL LOW (ref >60)
Glucose, Bld: 126 mg/dL — ABNORMAL HIGH (ref 70–99)
Phosphorus: >30 mg/dL — ABNORMAL HIGH (ref 2.5–4.6)
Potassium: 4.5 mmol/L (ref 3.5–5.1)
Sodium: 141 mmol/L (ref 135–145)

## 2019-06-14 LAB — CBC WITH DIFFERENTIAL/PLATELET
Abs Immature Granulocytes: 0.16 10*3/uL — ABNORMAL HIGH (ref 0.00–0.07)
Basophils Absolute: 0 10*3/uL (ref 0.0–0.1)
Basophils Relative: 1 %
Eosinophils Absolute: 0 10*3/uL (ref 0.0–0.5)
Eosinophils Relative: 0 %
HCT: 38.8 % — ABNORMAL LOW (ref 39.0–52.0)
Hemoglobin: 12.2 g/dL — ABNORMAL LOW (ref 13.0–17.0)
Immature Granulocytes: 4 %
Lymphocytes Relative: 22 %
Lymphs Abs: 0.9 10*3/uL (ref 0.7–4.0)
MCH: 26.9 pg (ref 26.0–34.0)
MCHC: 31.4 g/dL (ref 30.0–36.0)
MCV: 85.5 fL (ref 80.0–100.0)
Monocytes Absolute: 0.4 10*3/uL (ref 0.1–1.0)
Monocytes Relative: 11 %
Neutro Abs: 2.4 10*3/uL (ref 1.7–7.7)
Neutrophils Relative %: 62 %
Platelets: 183 10*3/uL (ref 150–400)
RBC: 4.54 MIL/uL (ref 4.22–5.81)
RDW: 15.9 % — ABNORMAL HIGH (ref 11.5–15.5)
WBC: 3.9 10*3/uL — ABNORMAL LOW (ref 4.0–10.5)
nRBC: 0 % (ref 0.0–0.2)

## 2019-06-14 LAB — GLUCOSE, CAPILLARY
Glucose-Capillary: 90 mg/dL (ref 70–99)
Glucose-Capillary: 134 mg/dL — ABNORMAL HIGH (ref 70–99)
Glucose-Capillary: 171 mg/dL — ABNORMAL HIGH (ref 70–99)
Glucose-Capillary: 278 mg/dL — ABNORMAL HIGH (ref 70–99)

## 2019-06-14 MED ORDER — CALCIUM ACETATE (PHOS BINDER) 667 MG PO CAPS
1334.0000 mg | ORAL_CAPSULE | Freq: Three times a day (TID) | ORAL | Status: DC
  Administered 2019-06-14 – 2019-06-16 (×6): 1334 mg via ORAL
  Filled 2019-06-14 (×6): qty 2

## 2019-06-14 NOTE — Progress Notes (Signed)
PROGRESS NOTE    Thomas [name]  GGE:[id number] DOB: [birth year] DOA: 06/07/2019 PCP: [facility]   Brief Narrative: 79 y.o. 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  Patient symptoms began on 06/03/2019 with loose stools and generalized weakness.  He also went on to develop increase in his chronic dyspnea and had a negative rapid Covid test on 06/05/2019.  He denies any abdominal pain or chest pain. Wife reports that his chronic jerking movements have worsened over the past week.   Evaluation in the ED patient was found to be hypoxic, oxygen sat in 90s on his usual 2 to 3 L of oxygen, chest x-ray consistent with fluid overload interstitial edema possible pneumonia.  Covid antigen test positive.   Assessment & Plan:   Principal Problem:   Pneumonia Active Problems:   Insulin-requiring or dependent type II diabetes mellitus ([place])   Diarrhea of presumed infectious origin   End stage renal disease (HCC)   Sepsis ([place])   Thrombocytopenia ([place])   Chronic respiratory failure with hypoxia (HCC)   COPD (chronic obstructive pulmonary disease) (HCC)   HTN (hypertension)   COVID-19 virus infection   Pneumonia due to COVID-19 virus   1-Acute on chronic hypoxic respiratory failure, worsening secondary to COVID-19 pneumonia: -Patient presented with weakness, increased shortness of breath chest x-ray with possible pneumonia. -Procalcitonin elevated; started on ceftriaxone and azithromycin, which will be continued until 1/8 -Continue with Decadron completed remdesivir. -Oxygen requirement increased again to 15 L/min via face mask working on  weaning down. He reports having O2 at home for night use but not during the day.     COVID-19 Labs  Recent Labs    06/12/19 1049 06/13/19 0533 06/13/19 1039  DDIMER 2.73* 2.81*  --   FERRITIN 836* 644*  --   CRP 6.9* 8.3* 7.5*    Lab Results  Component Value Date   SARSCOV2NAA POSITIVE (A) 06/07/2019   [place] NEGATIVE 04/10/2019   SARSCOV2NAA NOT DETECTED 01/24/2019   [place] NEGATIVE 01/21/2019    2-COPD with chronic Hypoxic Respiratory Failure.  -On 2-3 L chronic oxygen supplementation at home hs.  -Continue oxygen supplementation.  -Continue Dexamethasone.  -Continue Combivent.  [place]  3-ESRD on HD;  Nephrology consulted.  HD per their recs  4-Thrombocytopenia;  Related to acute illness.  Follow trend.  Monitor for bleeding.   DM insulin Dependent, poorly controlled.  A1c 7.9.  Continue SSI.   Myoclonus;  He has chronic tremors, worse lately.  Seems to be improving.  Hold gabapentin  HNT; Hold BP medications, SBP soft.   Metabolic acidosis;  Continue newly started sodium bicarb. Hopefully improve with HD.   Physical debility.  Likely due to his acute illness. Seen by PT with recs for SNF. CM consult placed. Patient agrees to go to SNF.   Loose stools.  Resolved. Denies abdominal pain.   Estimated body mass index is 27.77 kg/m as calculated from the following:   Height as of 04/14/19: 5\' 11"  (1.803 m).   Weight as of this encounter: 90.3 kg.   DVT prophylaxis: Heparin  Code Status: full code Family Communication:  Patient at the bedside Disposition Plan: Remains in the hospital for treatment of PNA in setting of covid Consultants:  Nephrology  Procedures:   none  Antimicrobials:    Subjective: He feels tired again today. Denies nausea, vomiting, diarrhea but has decreased appetite.  Objective: Vitals:   06/14/19 0830 06/14/19 0840 06/14/19 0949 06/14/19 1731  BP: 140/67 (!) 142/70 (!) 148/65 (!) 151/57  Pulse: 76 78 75 73    Resp: 16 16 16 19   Temp:  97.8 F (36.6 C) 98.1 F (36.7 C) 97.9 F (36.6 C)  TempSrc:  Oral Oral Oral  SpO2: 97% 97% 95% 90%  Weight:  90.3 kg      Intake/Output Summary (Last 24 hours) at 06/14/2019 1836 Last data filed at 06/14/2019 0840 Gross per 24 hour  Intake 350 ml  Output 2000 ml  Net -1650 ml   Filed Weights   06/13/19 0533 06/14/19 0600 06/14/19 0840  Weight: 91.5 kg 92 kg 90.3 kg    Examination:  General exam; NAD Respiratory system:  no wheezing, no respiratory distress Cardiovascular system: S 1, S 2 RRR Gastrointestinal system: Soft, NT, ND Central nervous system: alert, oriented to person and place Extremities: Symmetric power, no edema Skin: no rashes    Data Reviewed: I have personally reviewed following labs and imaging studies  CBC: Recent Labs  Lab 06/10/19 0400 06/11/19 0512 06/12/19 1049 06/13/19 0533 06/14/19 0438  WBC 2.5* 4.2 5.4 4.0 3.9*  NEUTROABS 1.8 2.8 3.9 2.6 2.4  HGB 11.8* 12.1* 12.6* 11.8* 12.2*  HCT 36.9* 38.4* 39.8 37.3* 38.8*  MCV 85.0 86.1 85.4 85.9 85.5  PLT 166 161 181 168 284   Basic Metabolic Panel: Recent Labs  Lab 06/10/19 0400 06/11/19 0512 06/12/19 1231 06/13/19 0533 06/14/19 0438  NA 140 140 136 140 141  K 4.7 4.6 4.3 4.6 4.5  CL 96* 96* 92* 95* 97*  CO2 17* 15* 20* 19* 20*  GLUCOSE 333* 187* 146* 110* 126*  BUN 105* 124* 90* 106* 122*  CREATININE 14.07* 14.94* 12.17* 13.52* 15.01*  CALCIUM 7.1* 7.4* 8.0* 8.5* 8.5*  PHOS  --   --   --   --  >30.0*   GFR: Estimated Creatinine Clearance: 4.3 mL/min (A) (by C-G formula based on SCr of 15.01 mg/dL (H)). Liver Function Tests: Recent Labs  Lab 06/09/19 0804 06/10/19 0400 06/11/19 0512 06/12/19 1231 06/13/19 0533 06/14/19 0438  AST 47* 40 33 32 28  --   ALT 39 38 34 32 31  --   ALKPHOS 95 98 87 95 95  --   BILITOT 0.4 0.5 0.5 0.6 0.5  --   PROT 6.6 6.4* 6.4* 6.4* 6.6  --   ALBUMIN 2.7* 2.5* 2.6* 2.6* 2.6* 2.5*   No results for input(s):  LIPASE, AMYLASE in the last 168 hours. No results for input(s): AMMONIA in the last 168 hours. Coagulation Profile: No results for input(s): INR, PROTIME in the last 168 hours. Cardiac Enzymes: No results for input(s): CKTOTAL, CKMB, CKMBINDEX, TROPONINI in the last 168 hours. BNP (last 3 results) No results for input(s): PROBNP in the last 8760 hours. HbA1C: No results for input(s): HGBA1C in the last 72 hours. CBG: Recent Labs  Lab 06/13/19 1632 06/13/19 2110 06/14/19 0943 06/14/19 1217 06/14/19 1734  GLUCAP 186* 167* 90 134* 171*   Lipid Profile: No results for input(s): CHOL, HDL, LDLCALC, TRIG, CHOLHDL, LDLDIRECT in the last 72 hours. Thyroid Function Tests: No results for input(s): TSH, T4TOTAL, FREET4, T3FREE, THYROIDAB in the last 72 hours. Anemia Panel: Recent Labs    06/12/19 1049 06/13/19 0533  FERRITIN 836*  644*   Sepsis Labs: Recent Labs  Lab 06/07/19 2226 06/07/19 2330  PROCALCITON  --  2.85  LATICACIDVEN 1.7  --     Recent Results (from the past 240 hour(s))  Blood Culture (routine x 2)     Status: None   Collection Time: 06/07/19 10:26 PM   Specimen: BLOOD RIGHT HAND  Result Value Ref Range Status   Specimen Description BLOOD RIGHT HAND  Final   Special Requests   Final    BOTTLES DRAWN AEROBIC AND ANAEROBIC Blood Culture results may not be optimal due to an inadequate volume of blood received in culture bottles   Culture   Final    NO GROWTH 5 DAYS Performed at [facility], [place] [address], [place], [place] [address]    Report Status 06/12/2019 FINAL  Final  SARS CORONAVIRUS 2 (TAT 6-24 HRS) Nasopharyngeal Nasopharyngeal Swab     Status: Abnormal   Collection Time: 06/07/19 10:30 PM   Specimen: Nasopharyngeal Swab  Result Value Ref Range Status   SARS Coronavirus 2 POSITIVE (A) NEGATIVE Final    Comment: RESULT CALLED TO, READ BACK BY AND VERIFIED WITH: K. MOON,RN 0340 06/08/2019 T. TYSOR (NOTE) SARS-CoV-2 target nucleic acids are  DETECTED. The SARS-CoV-2 RNA is generally detectable in upper and lower respiratory specimens during the acute phase of infection. Positive results are indicative of the presence of SARS-CoV-2 RNA. Clinical correlation with patient history and other diagnostic information is  necessary to determine patient infection status. Positive results do not rule out bacterial infection or co-infection with other viruses.  The expected result is Negative. Fact Sheet for Patients: SugarRoll.be Fact Sheet for Healthcare Providers: https://www.woods-mathews.com/ This test is not yet approved or cleared by the [place] FDA and  has been authorized for detection and/or diagnosis of SARS-CoV-2 by FDA under an Emergency Use Authorization (EUA). This EUA will remain  in effect (meaning this test can be used) for the  duration of the COVID-19 declaration under Section 564(b)(1) of the Act, 21 U.S.C. section 360bbb-3(b)(1), unless the authorization is terminated or revoked sooner. Performed at [facility], [place] [address] [place], [place] [address]   Blood Culture (routine x 2)     Status: None   Collection Time: 06/08/19 12:20 AM   Specimen: BLOOD  Result Value Ref Range Status   Specimen Description BLOOD RIGHT ANTECUBITAL  Final   Special Requests   Final    BOTTLES DRAWN AEROBIC AND ANAEROBIC Blood Culture results may not be optimal due to an inadequate volume of blood received in culture bottles   Culture   Final    NO GROWTH 5 DAYS Performed at [facility], [place] [address], [place], [place] [address]    Report Status 06/13/2019 FINAL  Final         Radiology Studies: No results found.      Scheduled Meds: . calcium acetate  1,334 mg Oral TID WC  . Chlorhexidine Gluconate Cloth  6 each Topical Q0600  . dexamethasone (DECADRON) injection  6 mg Intravenous Daily  . insulin aspart  0-5 Units Subcutaneous QHS  . insulin aspart   0-9 Units Subcutaneous TID WC  . insulin aspart  2 Units Subcutaneous TID WC  . insulin glargine  8 Units Subcutaneous BID  . Ipratropium-Albuterol  1 puff Inhalation Q6H  . isosorbide mononitrate  30 mg Oral Daily  . magnesium oxide  400 mg Oral QHS  . mometasone-formoterol  2 puff Inhalation  BID  . sodium bicarbonate  1,300 mg Oral BID  . sodium chloride flush  3 mL Intravenous Q12H  . vitamin B-12  1,000 mcg Oral QPM   Continuous Infusions: . sodium chloride    . sodium chloride       LOS: 6 days    Time spent: 25 minutes.     Blain Pais, MD [facility]   If 7PM-7AM, please contact night-coverage [email or web address] Password [facility] 06/14/2019, 6:36 PM

## 2019-06-14 NOTE — Progress Notes (Signed)
Patient ID: Thomas [name], male   DOB: [birth year], 79 y.o.   MRN: [id number] S: No overnight events by chart review and weaned to 9L with sats 92%. Completed HD early this AM with UF 2L, tol fine per RN.    O:BP (!) 148/65 (BP Location: Right Arm)   Pulse 75   Temp 98.1 F (36.7 C) (Oral)   Resp 16   Wt 90.3 kg   SpO2 95%   BMI 27.77 kg/m   Intake/Output Summary (Last 24 hours) at 06/14/2019 1103 Last data filed at 06/14/2019 0840 Gross per 24 hour  Intake 350 ml  Output 2000 ml  Net -1650 ml   Intake/Output: I/O last 3 completed shifts: In: 700 [IV Piggyback:700] Out: -   Intake/Output this shift:  Total I/O In: -  Out: 2000 [Other:2000] Weight change: 0.5 kg Physical exam: did not complete due to COVID + status.  In order to preserve PPE equipment and to minimize exposure to providers.  Notes from other caregivers reviewed   Recent Labs  Lab 06/07/19 2330 06/09/19 0804 06/10/19 0400 06/11/19 0512 06/12/19 1231 06/13/19 0533 06/14/19 0438  NA 134* 142 140 140 136 140 141  K 4.8 5.0 4.7 4.6 4.3 4.6 4.5  CL 94* 95* 96* 96* 92* 95* 97*  CO2 16* 16* 17* 15* 20* 19* 20*  GLUCOSE 179* 107* 333* 187* 146* 110* 126*  BUN 88* 122* 105* 124* 90* 106* 122*  CREATININE 13.68* 16.47* 14.07* 14.94* 12.17* 13.52* 15.01*  ALBUMIN 2.8* 2.7* 2.5* 2.6* 2.6* 2.6* 2.5*  CALCIUM 7.1* 6.9* 7.1* 7.4* 8.0* 8.5* 8.5*  PHOS  --   --   --   --   --   --  >30.0*  AST 44* 47* 40 33 32 28  --   ALT 44 39 38 34 32 31  --    Liver Function Tests: Recent Labs  Lab 06/11/19 0512 06/12/19 1231 06/13/19 0533 06/14/19 0438  AST 33 32 28  --   ALT 34 32 31  --   ALKPHOS 87 95 95  --   BILITOT 0.5 0.6 0.5  --   PROT 6.4* 6.4* 6.6  --   ALBUMIN 2.6* 2.6* 2.6* 2.5*   No results for input(s): LIPASE, AMYLASE in the last 168 hours. No results for input(s): AMMONIA in the last 168 hours. CBC: Recent Labs  Lab 06/10/19 0400 06/11/19 0512 06/12/19 1049 06/13/19 0533 06/14/19 0438  WBC  2.5* 4.2 5.4 4.0 3.9*  NEUTROABS 1.8 2.8 3.9 2.6 2.4  HGB 11.8* 12.1* 12.6* 11.8* 12.2*  HCT 36.9* 38.4* 39.8 37.3* 38.8*  MCV 85.0 86.1 85.4 85.9 85.5  PLT 166 161 181 168 183   Cardiac Enzymes: No results for input(s): CKTOTAL, CKMB, CKMBINDEX, TROPONINI in the last 168 hours. CBG: Recent Labs  Lab 06/13/19 0638 06/13/19 1159 06/13/19 1632 06/13/19 2110 06/14/19 0943  GLUCAP 92 88 186* 167* 90    Iron Studies:  Recent Labs    06/13/19 0533  FERRITIN 644*   Studies/Results: No results found. . calcium acetate  1,334 mg Oral TID WC  . Chlorhexidine Gluconate Cloth  6 each Topical Q0600  . dexamethasone (DECADRON) injection  6 mg Intravenous Daily  . insulin aspart  0-5 Units Subcutaneous QHS  . insulin aspart  0-9 Units Subcutaneous TID WC  . insulin aspart  2 Units Subcutaneous TID WC  . insulin glargine  8 Units Subcutaneous BID  . Ipratropium-Albuterol  1 puff Inhalation Q6H  .  isosorbide mononitrate  30 mg Oral Daily  . magnesium oxide  400 mg Oral QHS  . mometasone-formoterol  2 puff Inhalation BID  . sodium bicarbonate  1,300 mg Oral BID  . sodium chloride flush  3 mL Intravenous Q12H  . vitamin B-12  1,000 mcg Oral QPM    BMET    Component Value Date/Time   NA 141 06/14/2019 0438   NA 137 06/30/2012 1305   K 4.5 06/14/2019 0438   K 4.1 06/30/2012 1305   CL 97 (L) 06/14/2019 0438   CL 106 06/30/2012 1305   CO2 20 (L) 06/14/2019 0438   CO2 24 06/30/2012 1305   GLUCOSE 126 (H) 06/14/2019 0438   BUN 122 (H) 06/14/2019 0438   BUN 62 (A) 06/30/2012 1305   CREATININE 15.01 (H) 06/14/2019 0438   CREATININE 4.09 (H) 07/23/2012 0941   CALCIUM 8.5 (L) 06/14/2019 0438   CALCIUM 8.1 06/30/2012 1305   GFRNONAA 3 (L) 06/14/2019 0438   GFRAA 3 (L) 06/14/2019 0438   CBC    Component Value Date/Time   WBC 3.9 (L) 06/14/2019 0438   RBC 4.54 06/14/2019 0438   HGB 12.2 (L) 06/14/2019 0438   HCT 38.8 (L) 06/14/2019 0438   HCT 32 06/29/2012 1308   PLT 183  06/14/2019 0438   MCV 85.5 06/14/2019 0438   MCH 26.9 06/14/2019 0438   MCHC 31.4 06/14/2019 0438   RDW 15.9 (H) 06/14/2019 0438   LYMPHSABS 0.9 06/14/2019 0438   MONOABS 0.4 06/14/2019 0438   EOSABS 0.0 06/14/2019 0438   BASOSABS 0.0 06/14/2019 0438    Dialysis Access: Left AVF  Dialysis Orders: Center: [facility]  on MWF . EDW 94.5kg HD [place] 1K/2.5Ca  Time 4 hours Heparin 1600units IVP bolus then 1400 units/hr. Access LAVF BFR 350 DFR 600    Hectoral 4.5 mcg IV/HD Venofer  50 mg weekly    Assessment/Plan: 1.  Acute hypoxic respiratory failure due to Covid-19 pneumonia- started on rocephin, azithromycin in ED as well as decadron and remdesivir. 1. Weaned to 9L now but O2 sat 92% so will still likely need separate.  Hopefully will be stable for HD off floor in coming days.  2.  ESRD -   HD completed 1/10 AM.  On MWF outpt - will convert to that schedule prior to d/c. He is well below EDW.  Labs ok.  3.  Hypertension/volume  - normotensive now.  Follow with UF 4.  Anemia  - No esa due to Hgb >12 5.  Metabolic bone disease -  Cont with home meds; noted phos > 30 this AM, likely spurious lab.  6.  Nutrition -  Renal diet. 7. DM- per primary svc. 8. COPD- was on home o2 prior to admission.

## 2019-06-15 DIAGNOSIS — Z794 Long term (current) use of insulin: Secondary | ICD-10-CM

## 2019-06-15 DIAGNOSIS — E119 Type 2 diabetes mellitus without complications: Secondary | ICD-10-CM

## 2019-06-15 LAB — RENAL FUNCTION PANEL
Albumin: 2.6 g/dL — ABNORMAL LOW (ref 3.5–5.0)
Anion gap: 23 — ABNORMAL HIGH (ref 5–15)
BUN: 87 mg/dL — ABNORMAL HIGH (ref 8–23)
CO2: 23 mmol/L (ref 22–32)
Calcium: 8.9 mg/dL (ref 8.9–10.3)
Chloride: 97 mmol/L — ABNORMAL LOW (ref 98–111)
Creatinine, Ser: 12.67 mg/dL — ABNORMAL HIGH (ref 0.61–1.24)
GFR calc Af Amer: 4 mL/min — ABNORMAL LOW (ref >60)
GFR calc non Af Amer: 3 mL/min — ABNORMAL LOW (ref >60)
Glucose, Bld: 47 mg/dL — ABNORMAL LOW (ref 70–99)
Phosphorus: 9.1 mg/dL — ABNORMAL HIGH (ref 2.5–4.6)
Potassium: 4.2 mmol/L (ref 3.5–5.1)
Sodium: 143 mmol/L (ref 135–145)

## 2019-06-15 LAB — CBC WITH DIFFERENTIAL/PLATELET
Abs Immature Granulocytes: 0.09 10*3/uL — ABNORMAL HIGH (ref 0.00–0.07)
Basophils Absolute: 0 10*3/uL (ref 0.0–0.1)
Basophils Relative: 0 %
Eosinophils Absolute: 0 10*3/uL (ref 0.0–0.5)
Eosinophils Relative: 1 %
HCT: 40.2 % (ref 39.0–52.0)
Hemoglobin: 12.2 g/dL — ABNORMAL LOW (ref 13.0–17.0)
Immature Granulocytes: 2 %
Lymphocytes Relative: 19 %
Lymphs Abs: 1 10*3/uL (ref 0.7–4.0)
MCH: 26.5 pg (ref 26.0–34.0)
MCHC: 30.3 g/dL (ref 30.0–36.0)
MCV: 87.4 fL (ref 80.0–100.0)
Monocytes Absolute: 0.5 10*3/uL (ref 0.1–1.0)
Monocytes Relative: 10 %
Neutro Abs: 3.5 10*3/uL (ref 1.7–7.7)
Neutrophils Relative %: 68 %
Platelets: 205 10*3/uL (ref 150–400)
RBC: 4.6 MIL/uL (ref 4.22–5.81)
RDW: 15.9 % — ABNORMAL HIGH (ref 11.5–15.5)
WBC: 5.1 10*3/uL (ref 4.0–10.5)
nRBC: 0 % (ref 0.0–0.2)

## 2019-06-15 LAB — GLUCOSE, CAPILLARY
Glucose-Capillary: 82 mg/dL (ref 70–99)
Glucose-Capillary: 152 mg/dL — ABNORMAL HIGH (ref 70–99)
Glucose-Capillary: 169 mg/dL — ABNORMAL HIGH (ref 70–99)
Glucose-Capillary: 143 mg/dL — ABNORMAL HIGH (ref 70–99)

## 2019-06-15 MED ORDER — INSULIN GLARGINE 100 UNIT/ML ~~LOC~~ SOLN
8.0000 [IU] | Freq: Every day | SUBCUTANEOUS | Status: DC
  Administered 2019-06-16 – 2019-06-17 (×2): 8 [IU] via SUBCUTANEOUS
  Filled 2019-06-15 (×2): qty 0.08

## 2019-06-15 MED ORDER — DOXERCALCIFEROL 4 MCG/2ML IV SOLN: 4.5000 ug | INTRAVENOUS | Status: DC

## 2019-06-15 MED ORDER — CHLORHEXIDINE GLUCONATE CLOTH 2 % EX PADS: 6.0000 | MEDICATED_PAD | Freq: Every day | CUTANEOUS | Status: DC

## 2019-06-15 NOTE — Progress Notes (Signed)
PROGRESS NOTE    Thomas [name]  RDE:[id number] DOB: 16-Apr-1941 DOA: 06/07/2019 PCP: [facility], [facility]   Brief Narrative: 79 y.o. 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  Patient symptoms began on 06/03/2019 with loose stools and generalized weakness.  He also went on to develop increase in his chronic dyspnea and had a negative rapid Covid test on 06/05/2019.  He denies any abdominal pain or chest pain. Wife reports that his chronic jerking movements have worsened over the past week.   Evaluation in the ED patient was found to be hypoxic, oxygen sat in 90s on his usual 2 to 3 L of oxygen, chest x-ray consistent with fluid overload interstitial edema possible pneumonia.  Covid antigen test positive.   Assessment & Plan:   Principal Problem:   Pneumonia Active Problems:   Insulin-requiring or dependent type II diabetes mellitus ([place])   Diarrhea of presumed infectious origin   End stage renal disease (HCC)   Sepsis ([place])   Thrombocytopenia ([place])   Chronic respiratory failure with hypoxia (HCC)   COPD (chronic obstructive pulmonary disease) (HCC)   HTN (hypertension)   COVID-19 virus infection   Pneumonia due to COVID-19 virus   1-Acute on chronic hypoxic respiratory failure, worsening secondary to COVID-19 pneumonia: -Patient presented with weakness, increased shortness of breath chest x-ray with possible pneumonia. -Procalcitonin elevated; started on ceftriaxone and azithromycin, which will be continued until 1/8 -Continue with Decadron completed remdesivir. -Oxygen requirement up to 15 L/min via face mask but weaning off to 5L/min now.   He reports having O2 at home for night use but not during the day.     COVID-19 Labs  Recent Labs    06/13/19 0533 06/13/19 1039  DDIMER 2.81*  --   FERRITIN 644*  --   CRP 8.3* 7.5*    Lab Results  Component Value Date   SARSCOV2NAA POSITIVE (A) 06/07/2019   [place] NEGATIVE 04/10/2019   [place] NOT DETECTED 01/24/2019   [place] NEGATIVE 01/21/2019    2-COPD with chronic Hypoxic Respiratory Failure.  -On 2-3 L chronic oxygen supplementation at home hs.  -Continue oxygen supplementation.  -Continue Dexamethasone.  -Continue Combivent.  [place] Kitchen  3-ESRD on HD;  Nephrology consulted.  HD per their recs  4-Thrombocytopenia;  Related to acute illness.  Follow trend.  Monitor for bleeding.   DM insulin Dependent, poorly controlled.  A1c 7.9.  Continue SSI.  Reduce dose of Levemir to daily due to hypoglycemia on 1/11 am.   Myoclonus;  He has chronic tremors, worse lately.  Seems to be improving.  Hold gabapentin  HNT; Hold BP medications, SBP soft.   Metabolic acidosis;  Continue newly started sodium bicarb. Hopefully improve with HD.   Physical debility.  Likely due to his acute illness. Seen by PT with recs for SNF. CM consult placed. Patient agrees to go to SNF.   Loose stools.  Resolved. Denies abdominal pain.   Estimated body mass index is 27.8 kg/m as calculated from the following:   Height as of 04/14/19: 5\' 11"  (1.803 m).   Weight as of this encounter: 90.4 kg.   DVT prophylaxis: Heparin  Code Status: full code Family Communication:  Patient at the bedside Disposition Plan: Remains in the hospital for  treatment of PNA in setting of covid Consultants:   Nephrology  Procedures:   none  Antimicrobials:    Subjective: He continues to feel weak but his respiratory statis has improved.  Objective: Vitals:   06/14/19 2143 06/15/19 0500 06/15/19 0935 06/15/19 1705  BP: (!) 142/67  (!) 174/71 (!) 159/61  Pulse: 66  68 64  Resp: 20    20  Temp: 98.4 F (36.9 C)  97.8 F (36.6 C) 98.1 F (36.7 C)  TempSrc:      SpO2: 96%  91% 97%  Weight:  90.4 kg     No intake or output data in the 24 hours ending 06/15/19 1937 Filed Weights   06/14/19 0600 06/14/19 0840 06/15/19 0500  Weight: 92 kg 90.3 kg 90.4 kg    Examination:  General exam; NAD Respiratory system:  no wheezing, no respiratory distress Cardiovascular system: S 1, S 2 RRR Gastrointestinal system: Soft, NT, ND Central nervous system: alert, oriented to person and place Extremities: Symmetric power, no edema Skin: no rashes    Data Reviewed: I have personally reviewed following labs and imaging studies  CBC: Recent Labs  Lab 06/11/19 0512 06/12/19 1049 06/13/19 0533 06/14/19 0438 06/15/19 0335  WBC 4.2 5.4 4.0 3.9* 5.1  NEUTROABS 2.8 3.9 2.6 2.4 3.5  HGB 12.1* 12.6* 11.8* 12.2* 12.2*  HCT 38.4* 39.8 37.3* 38.8* 40.2  MCV 86.1 85.4 85.9 85.5 87.4  PLT 161 181 168 183 462   Basic Metabolic Panel: Recent Labs  Lab 06/11/19 0512 06/12/19 1231 06/13/19 0533 06/14/19 0438 06/15/19 0335  NA 140 136 140 141 143  K 4.6 4.3 4.6 4.5 4.2  CL 96* 92* 95* 97* 97*  CO2 15* 20* 19* 20* 23  GLUCOSE 187* 146* 110* 126* 47*  BUN 124* 90* 106* 122* 87*  CREATININE 14.94* 12.17* 13.52* 15.01* 12.67*  CALCIUM 7.4* 8.0* 8.5* 8.5* 8.9  PHOS  --   --   --  >30.0* 9.1*   GFR: Estimated Creatinine Clearance: 5.5 mL/min (A) (by C-G formula based on SCr of 12.67 mg/dL (H)). Liver Function Tests: Recent Labs  Lab 06/09/19 0804 06/10/19 0400 06/11/19 0512 06/12/19 1231 06/13/19 0533 06/14/19 0438 06/15/19 0335  AST 47* 40 33 32 28  --   --   ALT 39 38 34 32 31  --   --   ALKPHOS 95 98 87 95 95  --   --   BILITOT 0.4 0.5 0.5 0.6 0.5  --   --   PROT 6.6 6.4* 6.4* 6.4* 6.6  --   --   ALBUMIN 2.7* 2.5* 2.6* 2.6* 2.6* 2.5* 2.6*   No results for input(s): LIPASE, AMYLASE in the last 168 hours. No results for input(s): AMMONIA in the last 168  hours. Coagulation Profile: No results for input(s): INR, PROTIME in the last 168 hours. Cardiac Enzymes: No results for input(s): CKTOTAL, CKMB, CKMBINDEX, TROPONINI in the last 168 hours. BNP (last 3 results) No results for input(s): PROBNP in the last 8760 hours. HbA1C: No results for input(s): HGBA1C in the last 72 hours. CBG: Recent Labs  Lab 06/14/19 1734 06/14/19 2139 06/15/19 0933 06/15/19 1300 06/15/19 1702  GLUCAP 171* 278* 82 152* 169*   Lipid Profile: No results for input(s): CHOL, HDL, LDLCALC, TRIG, CHOLHDL, LDLDIRECT in the last 72 hours. Thyroid Function Tests: No results for input(s): TSH, T4TOTAL, FREET4, T3FREE, THYROIDAB in the last 72 hours. Anemia Panel: Recent Labs    06/13/19 0533  FERRITIN 644*  Sepsis Labs: No results for input(s): PROCALCITON, LATICACIDVEN in the last 168 hours.  Recent Results (from the past 240 hour(s))  Blood Culture (routine x 2)     Status: None   Collection Time: 06/07/19 10:26 PM   Specimen: BLOOD RIGHT HAND  Result Value Ref Range Status   Specimen Description BLOOD RIGHT HAND  Final   Special Requests   Final    BOTTLES DRAWN AEROBIC AND ANAEROBIC Blood Culture results may not be optimal due to an inadequate volume of blood received in culture bottles   Culture   Final    NO GROWTH 5 DAYS Performed at [facility], [place] [address] [place], [place] [address]    Report Status 06/12/2019 FINAL  Final  SARS CORONAVIRUS 2 (TAT 6-24 HRS) Nasopharyngeal Nasopharyngeal Swab     Status: Abnormal   Collection Time: 06/07/19 10:30 PM   Specimen: Nasopharyngeal Swab  Result Value Ref Range Status   SARS Coronavirus 2 POSITIVE (A) NEGATIVE Final    Comment: RESULT CALLED TO, READ BACK BY AND VERIFIED WITH: K. MOON,RN 0340 06/08/2019 T. TYSOR (NOTE) SARS-CoV-2 target nucleic acids are DETECTED. The SARS-CoV-2 RNA is generally detectable in upper and lower respiratory specimens during the acute phase of infection.  Positive results are indicative of the presence of SARS-CoV-2 RNA. Clinical correlation with patient history and other diagnostic information is  necessary to determine patient infection status. Positive results do not rule out bacterial infection or co-infection with other viruses.  The expected result is Negative. Fact Sheet for Patients: SugarRoll.be Fact Sheet for Healthcare Providers: https://www.woods-mathews.com/ This test is not yet approved or cleared by the [place] FDA and  has been authorized for detection and/or diagnosis of SARS-CoV-2 by FDA under an Emergency Use Authorization (EUA). This EUA will remain  in effect (meaning this test can be used) for the  duration of the COVID-19 declaration under Section 564(b)(1) of the Act, 21 U.S.C. section 360bbb-3(b)(1), unless the authorization is terminated or revoked sooner. Performed at [facility], [place] [address], [place], [place] [address]   Blood Culture (routine x 2)     Status: None   Collection Time: 06/08/19 12:20 AM   Specimen: BLOOD  Result Value Ref Range Status   Specimen Description BLOOD RIGHT ANTECUBITAL  Final   Special Requests   Final    BOTTLES DRAWN AEROBIC AND ANAEROBIC Blood Culture results may not be optimal due to an inadequate volume of blood received in culture bottles   Culture   Final    NO GROWTH 5 DAYS Performed at [facility], [place] [address], [place], [place] [address]    Report Status 06/13/2019 FINAL  Final         Radiology Studies: No results found.      Scheduled Meds: . calcium acetate  1,334 mg Oral TID WC  . Chlorhexidine Gluconate Cloth  6 each Topical Q0600  . [START ON 06/16/2019] Chlorhexidine Gluconate Cloth  6 each Topical Q0600  . dexamethasone (DECADRON) injection  6 mg Intravenous Daily  . [START ON 06/16/2019] doxercalciferol  4.5 mcg Intravenous Q T,Th,Sa-HD  . insulin aspart  0-5 Units Subcutaneous QHS   . insulin aspart  0-9 Units Subcutaneous TID WC  . insulin aspart  2 Units Subcutaneous TID WC  . [START ON 06/16/2019] insulin glargine  8 Units Subcutaneous Daily  . Ipratropium-Albuterol  1 puff Inhalation Q6H  . isosorbide mononitrate  30 mg Oral Daily  .  magnesium oxide  400 mg Oral QHS  . mometasone-formoterol  2 puff Inhalation BID  . sodium bicarbonate  1,300 mg Oral BID  . sodium chloride flush  3 mL Intravenous Q12H  . vitamin B-12  1,000 mcg Oral QPM   Continuous Infusions: . sodium chloride    . sodium chloride       LOS: 7 days    Time spent: 25 minutes.     Blain Pais, MD [facility]   If 7PM-7AM, please contact night-coverage [email or web address] Password [facility] 06/15/2019, 7:37 PM

## 2019-06-15 NOTE — Plan of Care (Signed)

## 2019-06-15 NOTE — Progress Notes (Signed)
Inpatient Diabetes Program Recommendations  AACE/ADA: New Consensus Statement on Inpatient Glycemic Control (2015)  Target Ranges:  Prepandial:   less than 140 mg/dL      Peak postprandial:   less than 180 mg/dL (1-2 hours)      Critically ill patients:  140 - 180 mg/dL   Lab Results  Component Value Date   GLUCAP 278 (H) 06/14/2019   HGBA1C 7.9 (H) 01/24/2019    Review of Glycemic Control Results for Thomas [name], Thomas [name] (MRN [id number]) as of 06/15/2019 09:28  Ref. Range 06/13/2019 21:10 06/14/2019 09:43 06/14/2019 12:17 06/14/2019 17:34 06/14/2019 21:39  Glucose-Capillary Latest Ref Range: 70 - 99 mg/dL 167 (H) 90 Lantus 8 units 134 (H) Novolog 3 units 171 (H) Novolog 4 units 278 (H) Lantus 8 units + Novolog 3 units   Diabetes history: DM2 Outpatient Diabetes medications: Lantus 20-30 units hs + Humalog 10 units tid Current orders for Inpatient glycemic control: Lantus 8 units bid + Novolog 2 units tid meal coverage + Novolog sensitive correction tid + hs 0-5 units  Inpatient Diabetes Program Recommendations:   -Decrease Lantus 8 units qd -Change Novolog correction to Custom Novolog correction scale 0-5 units       151-200  1 unit      201-250  2 units      251-300  3 units      301-350  4 units      351-400  5 units  Thank you, Bethena Roys E. Kamee Bobst, RN, MSN, CDE  Diabetes Coordinator Inpatient Glycemic Control Team Team Pager [phone] (8am-5pm) 06/15/2019 9:40 AM

## 2019-06-15 NOTE — Progress Notes (Signed)
Patient ID: Thomas [name], male   DOB: 1941-05-04, 79 y.o.   MRN: [id number] S: No overnight events by chart review -  Maybe on RA now ??  Completed HD early Sun AM with UF 2L, tol fine per RN.    O:BP (!) 142/67 (BP Location: Right Arm)   Pulse 66   Temp 98.4 F (36.9 C)   Resp 20   Wt 90.4 kg   SpO2 96%   BMI 27.80 kg/m   Intake/Output Summary (Last 24 hours) at 06/15/2019 0756 Last data filed at 06/14/2019 0840 Gross per 24 hour  Intake --  Output 2000 ml  Net -2000 ml   Intake/Output: I/O last 3 completed shifts: In: 350 [IV Piggyback:350] Out: 2000 [Other:2000]  Intake/Output this shift:  No intake/output data recorded. Weight change: -1.7 kg Physical exam: did not complete due to COVID + status.  In order to preserve PPE equipment and to minimize exposure to providers.  Notes from other caregivers reviewed   Recent Labs  Lab 06/09/19 0804 06/10/19 0400 06/11/19 0512 06/12/19 1231 06/13/19 0533 06/14/19 0438 06/15/19 0335  NA 142 140 140 136 140 141 143  K 5.0 4.7 4.6 4.3 4.6 4.5 4.2  CL 95* 96* 96* 92* 95* 97* 97*  CO2 16* 17* 15* 20* 19* 20* 23  GLUCOSE 107* 333* 187* 146* 110* 126* 47*  BUN 122* 105* 124* 90* 106* 122* 87*  CREATININE 16.47* 14.07* 14.94* 12.17* 13.52* 15.01* 12.67*  ALBUMIN 2.7* 2.5* 2.6* 2.6* 2.6* 2.5* 2.6*  CALCIUM 6.9* 7.1* 7.4* 8.0* 8.5* 8.5* 8.9  PHOS  --   --   --   --   --  >30.0* 9.1*  AST 47* 40 33 32 28  --   --   ALT 39 38 34 32 31  --   --    Liver Function Tests: Recent Labs  Lab 06/11/19 0512 06/12/19 1231 06/13/19 0533 06/14/19 0438 06/15/19 0335  AST 33 32 28  --   --   ALT 34 32 31  --   --   ALKPHOS 87 95 95  --   --   BILITOT 0.5 0.6 0.5  --   --   PROT 6.4* 6.4* 6.6  --   --   ALBUMIN 2.6* 2.6* 2.6* 2.5* 2.6*   No results for input(s): LIPASE, AMYLASE in the last 168 hours. No results for input(s): AMMONIA in the last 168 hours. CBC: Recent Labs  Lab 06/11/19 0512 06/12/19 1049 06/13/19 0533  06/14/19 0438 06/15/19 0335  WBC 4.2 5.4 4.0 3.9* 5.1  NEUTROABS 2.8 3.9 2.6 2.4 3.5  HGB 12.1* 12.6* 11.8* 12.2* 12.2*  HCT 38.4* 39.8 37.3* 38.8* 40.2  MCV 86.1 85.4 85.9 85.5 87.4  PLT 161 181 168 183 205   Cardiac Enzymes: No results for input(s): CKTOTAL, CKMB, CKMBINDEX, TROPONINI in the last 168 hours. CBG: Recent Labs  Lab 06/13/19 2110 06/14/19 0943 06/14/19 1217 06/14/19 1734 06/14/19 2139  GLUCAP 167* 90 134* 171* 278*    Iron Studies:  Recent Labs    06/13/19 0533  FERRITIN 644*   Studies/Results: No results found. . calcium acetate  1,334 mg Oral TID WC  . Chlorhexidine Gluconate Cloth  6 each Topical Q0600  . dexamethasone (DECADRON) injection  6 mg Intravenous Daily  . insulin aspart  0-5 Units Subcutaneous QHS  . insulin aspart  0-9 Units Subcutaneous TID WC  . insulin aspart  2 Units Subcutaneous TID WC  . insulin  glargine  8 Units Subcutaneous BID  . Ipratropium-Albuterol  1 puff Inhalation Q6H  . isosorbide mononitrate  30 mg Oral Daily  . magnesium oxide  400 mg Oral QHS  . mometasone-formoterol  2 puff Inhalation BID  . sodium bicarbonate  1,300 mg Oral BID  . sodium chloride flush  3 mL Intravenous Q12H  . vitamin B-12  1,000 mcg Oral QPM    BMET    Component Value Date/Time   NA 143 06/15/2019 0335   NA 137 06/30/2012 1305   K 4.2 06/15/2019 0335   K 4.1 06/30/2012 1305   CL 97 (L) 06/15/2019 0335   CL 106 06/30/2012 1305   CO2 23 06/15/2019 0335   CO2 24 06/30/2012 1305   GLUCOSE 47 (L) 06/15/2019 0335   BUN 87 (H) 06/15/2019 0335   BUN 62 (A) 06/30/2012 1305   CREATININE 12.67 (H) 06/15/2019 0335   CREATININE 4.09 (H) 07/23/2012 0941   CALCIUM 8.9 06/15/2019 0335   CALCIUM 8.1 06/30/2012 1305   GFRNONAA 3 (L) 06/15/2019 0335   GFRAA 4 (L) 06/15/2019 0335   CBC    Component Value Date/Time   WBC 5.1 06/15/2019 0335   RBC 4.60 06/15/2019 0335   HGB 12.2 (L) 06/15/2019 0335   HCT 40.2 06/15/2019 0335   HCT 32 06/29/2012  1308   PLT 205 06/15/2019 0335   MCV 87.4 06/15/2019 0335   MCH 26.5 06/15/2019 0335   MCHC 30.3 06/15/2019 0335   RDW 15.9 (H) 06/15/2019 0335   LYMPHSABS 1.0 06/15/2019 0335   MONOABS 0.5 06/15/2019 0335   EOSABS 0.0 06/15/2019 0335   BASOSABS 0.0 06/15/2019 0335    Dialysis Access: Left AVF  Dialysis Orders: Center: [facility]  on MWF . EDW 94.5kg HD [place] 1K/2.5Ca  Time 4 hours Heparin 1600units IVP bolus then 1400 units/hr. Access LAVF BFR 350 DFR 600    Hectoral 4.5 mcg IV/HD Venofer  50 mg weekly    Assessment/Plan: 1.  Acute hypoxic respiratory failure due to Covid-19 pneumonia- started  decadron and given remdesivir. Weaned  O2   2. ESRD -   HD completed 1/10 AM.  On MWF outpt - will convert to that schedule prior to d/c. He is well below EDW but seems to be benefiting from more UF .  Labs ok. Hopeful for next HD tomorrow 1/12 3.  Hypertension/volume  - normotensive now.  Follow with UF 4.  Anemia  - No esa due to Hgb >12 5.  Metabolic bone disease -  Cont with home meds- hect and phoslo; noted phos up 6.  Nutrition -  Renal diet. 7. DM- per primary svc. 8. COPD- was on home o2 prior to admission. Not sure what end point is, there is talk of SNF

## 2019-06-15 NOTE — Progress Notes (Signed)
Physical Therapy Treatment Patient Details Name: Thomas [name] MRN: [id number] DOB: Aug 15, 1940 Today's Date: 06/15/2019    History of Present Illness 79 yo male admitted with COVID + acute hypoxic respiratory failure, hypertension, anemia and metabolic bone disease PMH ESRD on HD MWF COPD 3L at home, CAD, Neuropathy, nose cancer with nose amputation DM     PT Comments    Pt admitted with above diagnosis. Pt was able to sit EOB with min guard assist up to 15 min and perform some exercises.  Attempts to stand unsuccessful.  Will continue PT.   Pt currently with functional limitations due to balance and endurance deficits. Pt will benefit from skilled PT to increase their independence and safety with mobility to allow discharge to the venue listed below.     Follow Up Recommendations  SNF(unless mobility improves)     Equipment Recommendations  None recommended by PT    Recommendations for Other Services       Precautions / Restrictions Precautions Precautions: Fall Restrictions Weight Bearing Restrictions: No    Mobility  Bed Mobility Overal bed mobility: Needs Assistance Bed Mobility: Rolling;Sidelying to Sit Rolling: Min guard Sidelying to sit: Min assist Supine to sit: Min assist;HOB elevated Sit to supine: Min guard   General bed mobility comments: patient rolling in bed with min guard, pt able to come to EOB with min assist  Transfers Overall transfer level: Needs assistance Equipment used: Rolling walker (2 wheeled) Transfers: Sit to/from Stand Sit to Stand: Total assist         General transfer comment: Attempted to stand x 3 and pt could not clear buttocks off bed with one person assist. Pt was able to scoot sideways up in bed.   Ambulation/Gait                 Stairs             Wheelchair Mobility    Modified Rankin (Stroke Patients Only)       Balance Overall balance assessment: Needs assistance Sitting-balance support: No upper  extremity supported;Feet supported Sitting balance-Leahy Scale: Good Sitting balance - Comments: Pt could do LE exercise sitting at EOB. Sat up for 15 min EOB.    Standing balance support: Bilateral upper extremity supported Standing balance-Leahy Scale: Poor Standing balance comment: requires UE support                            Cognition Arousal/Alertness: Awake/alert Behavior During Therapy: WFL for tasks assessed/performed Overall Cognitive Status: Within Functional Limits for tasks assessed                                 General Comments: talkative and needed redirection       Exercises General Exercises - Lower Extremity Long Arc Quad: AROM;Both;10 reps;Seated    General Comments General comments (skin integrity, edema, etc.): Rubbed pts back with lotion while he was sitting up.       Pertinent Vitals/Pain Pain Assessment: Faces Faces Pain Scale: Hurts little more Pain Location: buttocks  Pain Descriptors / Indicators: Discomfort;Grimacing;Moaning Pain Intervention(s): Limited activity within patient's tolerance;Monitored during session;Repositioned    Home Living                      Prior Function            PT Goals (current goals  can now be found in the care plan section) Acute Rehab PT Goals Patient Stated Goal: go home Progress towards PT goals: Progressing toward goals    Frequency    Min 2X/week      PT Plan Current plan remains appropriate;Frequency needs to be updated    Co-evaluation              AM-PAC PT "6 Clicks" Mobility   Outcome Measure  Help needed turning from your back to your side while in a flat bed without using bedrails?: A Little Help needed moving from lying on your back to sitting on the side of a flat bed without using bedrails?: A Little Help needed moving to and from a bed to a chair (including a wheelchair)?: Total Help needed standing up from a chair using your arms (e.g.,  wheelchair or bedside chair)?: Total Help needed to walk in hospital room?: Total Help needed climbing 3-5 steps with a railing? : Total 6 Click Score: 10    End of Session Equipment Utilized During Treatment: Oxygen(50% 12 L face mask) Activity Tolerance: Patient limited by fatigue Patient left: in bed;with call bell/phone within reach;with bed alarm set Nurse Communication: Mobility status PT Visit Diagnosis: Unsteadiness on feet (R26.81);Other abnormalities of gait and mobility (R26.89)     Time: 1749-4496 PT Time Calculation (min) (ACUTE ONLY): 36 min  Charges:  $Therapeutic Exercise: 8-22 mins $Therapeutic Activity: 8-22 mins                     Thomas [name] W,PT Acute Rehabilitation Services Pager:  [phone]  Office:  [place] 06/15/2019, 1:00 PM

## 2019-06-16 LAB — RENAL FUNCTION PANEL
Albumin: 2.5 g/dL — ABNORMAL LOW (ref 3.5–5.0)
Anion gap: 21 — ABNORMAL HIGH (ref 5–15)
BUN: 103 mg/dL — ABNORMAL HIGH (ref 8–23)
CO2: 23 mmol/L (ref 22–32)
Calcium: 8.8 mg/dL — ABNORMAL LOW (ref 8.9–10.3)
Chloride: 99 mmol/L (ref 98–111)
Creatinine, Ser: 14.81 mg/dL — ABNORMAL HIGH (ref 0.61–1.24)
GFR calc Af Amer: 3 mL/min — ABNORMAL LOW (ref >60)
GFR calc non Af Amer: 3 mL/min — ABNORMAL LOW (ref >60)
Glucose, Bld: 95 mg/dL (ref 70–99)
Phosphorus: 10.4 mg/dL — ABNORMAL HIGH (ref 2.5–4.6)
Potassium: 4.9 mmol/L (ref 3.5–5.1)
Sodium: 143 mmol/L (ref 135–145)

## 2019-06-16 LAB — CBC WITH DIFFERENTIAL/PLATELET
Abs Immature Granulocytes: 0.08 10*3/uL — ABNORMAL HIGH (ref 0.00–0.07)
Basophils Absolute: 0 10*3/uL (ref 0.0–0.1)
Basophils Relative: 0 %
Eosinophils Absolute: 0.1 10*3/uL (ref 0.0–0.5)
Eosinophils Relative: 2 %
HCT: 40.7 % (ref 39.0–52.0)
Hemoglobin: 12.1 g/dL — ABNORMAL LOW (ref 13.0–17.0)
Immature Granulocytes: 1 %
Lymphocytes Relative: 16 %
Lymphs Abs: 1 10*3/uL (ref 0.7–4.0)
MCH: 26.9 pg (ref 26.0–34.0)
MCHC: 29.7 g/dL — ABNORMAL LOW (ref 30.0–36.0)
MCV: 90.4 fL (ref 80.0–100.0)
Monocytes Absolute: 0.5 10*3/uL (ref 0.1–1.0)
Monocytes Relative: 8 %
Neutro Abs: 4.6 10*3/uL (ref 1.7–7.7)
Neutrophils Relative %: 73 %
Platelets: 189 10*3/uL (ref 150–400)
RBC: 4.5 MIL/uL (ref 4.22–5.81)
RDW: 15.7 % — ABNORMAL HIGH (ref 11.5–15.5)
WBC: 6.3 10*3/uL (ref 4.0–10.5)
nRBC: 0 % (ref 0.0–0.2)

## 2019-06-16 LAB — GLUCOSE, CAPILLARY
Glucose-Capillary: 89 mg/dL (ref 70–99)
Glucose-Capillary: 195 mg/dL — ABNORMAL HIGH (ref 70–99)
Glucose-Capillary: 191 mg/dL — ABNORMAL HIGH (ref 70–99)
Glucose-Capillary: 174 mg/dL — ABNORMAL HIGH (ref 70–99)

## 2019-06-16 MED ORDER — CALCIUM ACETATE (PHOS BINDER) 667 MG PO CAPS
2001.0000 mg | ORAL_CAPSULE | Freq: Three times a day (TID) | ORAL | Status: DC
  Administered 2019-06-16 – 2019-06-17 (×3): 2001 mg via ORAL
  Filled 2019-06-16 (×3): qty 3

## 2019-06-16 MED ORDER — LOPERAMIDE HCL 2 MG PO CAPS
2.0000 mg | ORAL_CAPSULE | Freq: Two times a day (BID) | ORAL | Status: DC | PRN
  Administered 2019-06-16 – 2019-06-17 (×2): 2 mg via ORAL
  Filled 2019-06-16 (×2): qty 1

## 2019-06-16 NOTE — TOC Progression Note (Signed)
Transition of Care [facility]) - Progression Note    Patient Details  Name: Thomas [name] MRN: [id number] Date of Birth: 06-Mar-1941  [facility] CM/SW Contact  Bartholomew Crews, RN Phone Number: [phone] 06/16/2019, 4:51 PM  Clinical Narrative:    Notified by MD and nursing that patient wanting to discharge home and not to SNF. Nursing advised that patient has been out of bed with walker to sit up in chair. PT notified for an additional session in the AM prior to discharge. Patient to have HD this evening or early AM, and will be able to start outpatient hemodialysis on Thursday. Renal navigator to reach out to daughter, Jonelle Sidle, to discuss details. Referral placed to [facility] PT, OT, Aide accepted. Patient will need HH orders with [facility] to Face. Spoke with daughter, Jonelle Sidle, to discuss details for [facility] and pending outpatient hemodialysis. Family to arrange for transportation home from hospital and for outpatient dialysis. TOC following for transition needs.   Expected Discharge Plan: Skilled Nursing Facility Barriers to Discharge: SNF Pending bed offer  Expected Discharge Plan and Services Expected Discharge Plan: [place] In-house Referral: NA Discharge Planning Services: CM Consult Post Acute Care Choice: [place] Living arrangements for the past 2 months: Single Family Home                 DME Arranged: N/A DME Agency: NA       HH Arranged: NA HH Agency: NA         Social Determinants of Health (SDOH) Interventions    Readmission Risk Interventions Readmission Risk Prevention Plan 01/30/2019  Transportation Screening Complete  PCP or Specialist Appt within 3-5 Days Complete  HRI or Home Care Consult Complete  Social Work Consult for [place] Planning/Counseling Complete  Palliative Care Screening Not Applicable  Medication Review Press photographer) Complete  Some recent data might be hidden

## 2019-06-16 NOTE — TOC Initial Note (Signed)
Transition of Care [facility]) - Initial/Assessment Note    Patient Details  Name: Thomas [name] MRN: [id number] Date of Birth: [birth year]  Transition of [facility]) CM/SW Contact:    Bartholomew Crews, RN Phone Number: [phone] 06/16/2019, 11:18 AM  Clinical Narrative:                 Notified by MD that patient was ready for discharge. Spoke with patient on the phone briefly until call dropped. Patient was wanting to go home, but agreeable to rehab. States that his spouse is sick and may need rehab as well.   Spoke with his daughter, Jonelle Sidle. Patient and spouse live with Jonelle Sidle and her spouse. Patient has an oxygen concentrator at home from [place] which is set to 4-5 that he uses at night and as needed. He has a RW that he uses regularly and he has a 3N1. Patient attends [facility] on MWF, however, he cannot return to that center until he is 21 days post covid diagnosis. Family has been transporting patient to dialysis. Jonelle Sidle states that patient was exposed to covid at dialysis and now entire household is positive for covid.   Discussed with Jonelle Sidle that patient was requiring mod to max assist with mobility and adls. She is agreeable to STR, but the goal is for him to return home. She is agreeable to patient information being faxed out to facilities and understands barriers d/t covid diagnosis and HD needs.   Spoke with renal navigator, patient can go to [facility] on TTS schedule until he is able to return to his home center.   Spoke with liaison at [facility]. They are unable to make a bed offer d/t not accepting HD patients at this time.   MD aware of barriers. Will continue to search for facility. Anticipate that patient may progress with therapy goals and be able to transition home with [facility] before an accepting facility is found. TOC following for transition needs.    Expected Discharge Plan: Skilled Nursing Facility Barriers to Discharge: SNF Pending bed offer   Patient  Goals and CMS Choice Patient states their goals for this hospitalization and ongoing recovery are:: ultimately to return home with family CMS Medicare.gov Compare Post Acute Care list provided to:: Patient Represenative (must comment) Choice offered to / list presented to : Adult Children  Expected Discharge Plan and Services Expected Discharge Plan: [place] In-house Referral: NA Discharge Planning Services: CM Consult Post Acute Care Choice: [place]  Living arrangements for the past 2 months: Single Family Home                 DME Arranged: N/A DME Agency: NA       HH Arranged: NA HH Agency: NA        Prior Living Arrangements/Services Living arrangements for the past 2 months: Single Family Home Lives with:: Self, Adult Children, Spouse Patient language and need for interpreter reviewed:: Yes        Need for Family Participation in Patient Care: Yes (Comment) Care giver support system in place?: Yes (comment) Current home services: DME Criminal Activity/Legal Involvement Pertinent to Current Situation/Hospitalization: No - Comment as needed  Activities of Daily Living Home Assistive Devices/Equipment: Gilford Rile (specify type) ADL Screening (condition at time of admission) Patient's cognitive ability adequate to safely complete daily activities?: Yes Is the patient deaf or have difficulty hearing?: No Does the patient have difficulty seeing, even when wearing glasses/contacts?: Yes Does the patient  have difficulty concentrating, remembering, or making decisions?: No Patient able to express need for assistance with ADLs?: Yes Does the patient have difficulty dressing or bathing?: No Independently performs ADLs?: Yes (appropriate for developmental age) Does the patient have difficulty walking or climbing stairs?: Yes Weakness of Legs: Both Weakness of Arms/Hands: Both  Permission Sought/Granted Permission sought to share information with :  Family Supports Permission granted to share information with : Yes, Release of Information Signed  Share Information with NAME: Toney Reil     Permission granted to share info w Relationship: daughter  Permission granted to share info w Contact Information: [phone]  Emotional Assessment Appearance:: Appears stated age Attitude/Demeanor/Rapport: Engaged Affect (typically observed): Accepting Orientation: : Oriented to Self, Oriented to  Time, Oriented to Place, Oriented to Situation Alcohol / Substance Use: Not Applicable Psych Involvement: No (comment)  Admission diagnosis:  Diarrhea of presumed infectious origin [R19.7] Thrombocytopenia (HCC) [D69.6] End-stage renal disease on hemodialysis ([place]) [N18.6, Z99.2] Sepsis ([place]) [A41.9] Community acquired pneumonia, unspecified laterality [J18.9] Pneumonia due to COVID-19 virus [U07.1, J12.82] Patient Active Problem List   Diagnosis Date Noted  . Pneumonia due to COVID-19 virus 06/09/2019  . Sepsis ([place]) 06/08/2019  . Thrombocytopenia ([place]) 06/08/2019  . Chronic respiratory failure with hypoxia ([place]) 06/08/2019  . COPD (chronic obstructive pulmonary disease) ([place])   . HTN (hypertension)   . COVID-19 virus infection   . Volume overload 01/26/2019  . Pneumonia 01/25/2019  . Generalized weakness 01/24/2019  . Congestive heart failure ([place]) 05/11/2016  . Venous stenosis of left upper extremity 05/11/2016  . End stage renal disease ([place]) 06/19/2013  . Rectal bleeding 11/27/2012  . Upper abdominal pain 07/07/2012  . Anemia 07/07/2012  . Chronic nausea 07/07/2012  . Microscopic colitis 03/07/2012  . Diarrhea of presumed infectious origin 02/01/2010  . DIVERTICULITIS, HX OF 02/01/2010  . CONSTIPATION 09/10/2008  . ABDOMINAL PAIN, LOWER 09/10/2008  . Insulin-requiring or dependent type II diabetes mellitus ([place]) 09/09/2008  . CIGARETTE SMOKER 09/09/2008  . CORONARY ARTERY DISEASE 09/09/2008  . 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  Esophageal reflux 09/09/2008  . HIATAL HERNIA 09/09/2008  . HOARSENESS 09/09/2008   PCP:  Medicine, [facility]:   [place], [place] [address] [place] [address] Phone: [phone] Fax: [phone]     Social Determinants of Health (SDOH) Interventions    Readmission Risk Interventions Readmission Risk Prevention Plan 01/30/2019  Transportation Screening Complete  PCP or Specialist Appt within 3-5 Days Complete  HRI or [place] Complete  Social Work Consult for [place] Planning/Counseling Complete  Palliative Care Screening Not Applicable  Medication Review Press photographer) Complete  Some recent data might be hidden

## 2019-06-16 NOTE — Progress Notes (Signed)
Patient will receive OP HD treatment at [facility] in [place] TTS (approximately 12:00pm). Clinic manager is not at the clinic at this time, so Renal Navigator will follow up tomorrow prior to confirm time. Per CM/W. Estelle Grumbles, patient will discharge home with [facility] tomorrow, 06/17/19, and therefore, will start at [place] isolation shift on Thursday, 06/18/19.   Alphonzo Cruise, [place] Renal Navigator [phone]

## 2019-06-16 NOTE — Progress Notes (Signed)
PROGRESS NOTE    Thomas [name]  GYJ:[id number] DOB: 04/17/1941 DOA: 06/07/2019 PCP: Medicine, [place] [facility]   Brief Narrative: 79 y.o. 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  Patient symptoms began on 06/03/2019 with loose stools and generalized weakness.  He also went on to develop increase in his chronic dyspnea and had a negative rapid Covid test on 06/05/2019.  He denies any abdominal pain or chest pain. Wife reports that his chronic jerking movements have worsened over the past week.   Evaluation in the ED patient was found to be hypoxic, oxygen sat in 90s on his usual 2 to 3 L of oxygen, chest x-ray consistent with fluid overload interstitial edema possible pneumonia.  Covid antigen test positive.   Assessment & Plan:   Principal Problem:   Pneumonia Active Problems:   Insulin-requiring or dependent type II diabetes mellitus ([place])   Diarrhea of presumed infectious origin   End stage renal disease (HCC)   Sepsis ([place])   Thrombocytopenia ([place])   Chronic respiratory failure with hypoxia (HCC)   COPD (chronic obstructive pulmonary disease) (HCC)   HTN (hypertension)   COVID-19 virus infection   Pneumonia due to COVID-19 virus   1-Acute on chronic hypoxic respiratory failure, worsening secondary to COVID-19 pneumonia: -Patient presented with weakness, increased shortness of breath chest x-ray with possible pneumonia. -Procalcitonin elevated; started on ceftriaxone and azithromycin, which will be continued until 1/8 -Continue with Decadron completed remdesivir. -Oxygen requirement up to 15 L/min via face mask but weaning off to 5L/min now.   He reports having O2 at home for night use but not during the day.  Of note, he uses his own face mask (from home) due to loss of nasal cartilage/tissue and not being able to use [place].   COVID-19 Labs  No results for input(s): DDIMER, FERRITIN, LDH, CRP in the last 72 hours.  Lab Results  Component Value Date   SARSCOV2NAA POSITIVE (A) 06/07/2019   [place] NEGATIVE 04/10/2019   [place] NOT DETECTED 01/24/2019   [place] NEGATIVE 01/21/2019    2-COPD with chronic Hypoxic Respiratory Failure.  -On 2-4 L chronic oxygen supplementation at home hs via face mask.  -Continue oxygen supplementation.  -Continue Dexamethasone.  -Continue Combivent.  Marland Kitchen  3-ESRD on HD;  Nephrology consulted.  HD per their recs, will have HD today Will resume outpatient HD, appreciate CM arranging for outpatient HD w/ COVID precautions  4-Thrombocytopenia;  Related to acute illness.  Follow trend.  Monitor for bleeding.   DM insulin Dependent, poorly controlled.  A1c 7.9.  Continue SSI.  Reduced dose of Levemir to daily from bid due to hypoglycemia on 1/11 am.   Myoclonus;  He has chronic tremors, worse lately.  Seems to be improving.  Hold gabapentin  HNT; Hold BP medications, SBP soft post-dialysis, continue to monitor.   Metabolic acidosis;  Continue newly started sodium bicarb. Hopefully improve with HD.   Physical debility.  Likely due to his acute illness. Seen by PT with recs for SNF. CM consult placed. Patient now dose not want to go to SNF. His family will be able to provide supervision. He will need HH PT, OT, and aide at the time of dc.    Loose stools.  Resolved. Denies  abdominal pain.   Estimated body mass index is 27.8 kg/m as calculated from the following:   Height as of 04/14/19: 5\' 11"  (1.803 m).   Weight as of this encounter: 90.4 kg.   DVT prophylaxis: Heparin  Code Status: full code Family Communication:  Patient at the bedside Disposition Plan: Remains in  the hospital for treatment of PNA in setting of covid, may be ready for dc in 1-2 days. Will go home with [facility] PT, OT, and aide  Consultants:   Nephrology  Procedures:   none  Antimicrobials:    Subjective: He continues to feel weak but his respiratory status has improved. He is motivated to go home with [facility] services.  Objective: Vitals:   06/15/19 1705 06/15/19 2132 06/16/19 0856 06/16/19 1715  BP: (!) 159/61 91/76 (!) 160/59 (!) 161/55  Pulse: 64 69 80 61  Resp: 20 20    Temp: 98.1 F (36.7 C) 98.4 F (36.9 C) 98.3 F (36.8 C) (!) 97.5 F (36.4 C)  TempSrc:   Axillary Oral  SpO2: 97% 94% (!) 84% 96%  Weight:        Intake/Output Summary (Last 24 hours) at 06/16/2019 1716 Last data filed at 06/15/2019 2143 Gross per 24 hour  Intake 3 ml  Output -  Net 3 ml   Filed Weights   06/14/19 0600 06/14/19 0840 06/15/19 0500  Weight: 92 kg 90.3 kg 90.4 kg    Examination:  General exam; NAD Respiratory system:  no wheezing, no respiratory distress Cardiovascular system: S 1, S 2 RRR Gastrointestinal system: Soft, NT, ND Central nervous system: alert, oriented to person and place Extremities: Symmetric power, no edema Skin: no rashes    Data Reviewed: I have personally reviewed following labs and imaging studies  CBC: Recent Labs  Lab 06/12/19 1049 06/13/19 0533 06/14/19 0438 06/15/19 0335 06/16/19 0624  WBC 5.4 4.0 3.9* 5.1 6.3  NEUTROABS 3.9 2.6 2.4 3.5 4.6  HGB 12.6* 11.8* 12.2* 12.2* 12.1*  HCT 39.8 37.3* 38.8* 40.2 40.7  MCV 85.4 85.9 85.5 87.4 90.4  PLT 181 168 183 205 962   Basic Metabolic Panel: Recent Labs  Lab 06/12/19 1231 06/13/19 0533 06/14/19 0438 06/15/19 0335 06/16/19 0624  NA 136 140 141 143 143  K 4.3 4.6 4.5 4.2 4.9  CL 92* 95* 97* 97* 99  CO2 20* 19* 20* 23 23  GLUCOSE 146* 110* 126* 47* 95  BUN 90* 106* 122* 87* 103*  CREATININE 12.17* 13.52* 15.01* 12.67* 14.81*  CALCIUM 8.0* 8.5* 8.5* 8.9 8.8*  PHOS  --   --  >30.0* 9.1*  10.4*   GFR: Estimated Creatinine Clearance: 4.7 mL/min (A) (by C-G formula based on SCr of 14.81 mg/dL (H)). Liver Function Tests: Recent Labs  Lab 06/10/19 0400 06/11/19 0512 06/12/19 1231 06/13/19 0533 06/14/19 0438 06/15/19 0335 06/16/19 0624  AST 40 33 32 28  --   --   --   ALT 38 34 32 31  --   --   --   ALKPHOS 98 87 95 95  --   --   --   BILITOT 0.5 0.5 0.6 0.5  --   --   --   PROT 6.4* 6.4* 6.4* 6.6  --   --   --   ALBUMIN 2.5* 2.6* 2.6* 2.6* 2.5* 2.6* 2.5*   No results for input(s): LIPASE, AMYLASE in the last 168 hours. No results for input(s): AMMONIA in the last 168 hours. Coagulation Profile: No results for  input(s): INR, PROTIME in the last 168 hours. Cardiac Enzymes: No results for input(s): CKTOTAL, CKMB, CKMBINDEX, TROPONINI in the last 168 hours. BNP (last 3 results) No results for input(s): PROBNP in the last 8760 hours. HbA1C: No results for input(s): HGBA1C in the last 72 hours. CBG: Recent Labs  Lab 06/15/19 1702 06/15/19 2127 06/16/19 0855 06/16/19 1225 06/16/19 1649  GLUCAP 169* 143* 89 195* 191*   Lipid Profile: No results for input(s): CHOL, HDL, LDLCALC, TRIG, CHOLHDL, LDLDIRECT in the last 72 hours. Thyroid Function Tests: No results for input(s): TSH, T4TOTAL, FREET4, T3FREE, THYROIDAB in the last 72 hours. Anemia Panel: No results for input(s): VITAMINB12, FOLATE, FERRITIN, TIBC, IRON, RETICCTPCT in the last 72 hours. Sepsis Labs: No results for input(s): PROCALCITON, LATICACIDVEN in the last 168 hours.  Recent Results (from the past 240 hour(s))  Blood Culture (routine x 2)     Status: None   Collection Time: 06/07/19 10:26 PM   Specimen: BLOOD RIGHT HAND  Result Value Ref Range Status   Specimen Description BLOOD RIGHT HAND  Final   Special Requests   Final    BOTTLES DRAWN AEROBIC AND ANAEROBIC Blood Culture results may not be optimal due to an inadequate volume of blood received in culture bottles   Culture   Final    NO  GROWTH 5 DAYS Performed at [facility], [place] [address] [place], [place] [address]    Report Status 06/12/2019 FINAL  Final  SARS CORONAVIRUS 2 (TAT 6-24 HRS) Nasopharyngeal Nasopharyngeal Swab     Status: Abnormal   Collection Time: 06/07/19 10:30 PM   Specimen: Nasopharyngeal Swab  Result Value Ref Range Status   SARS Coronavirus 2 POSITIVE (A) NEGATIVE Final    Comment: RESULT CALLED TO, READ BACK BY AND VERIFIED WITH: K. MOON,RN 0340 06/08/2019 T. TYSOR (NOTE) SARS-CoV-2 target nucleic acids are DETECTED. The SARS-CoV-2 RNA is generally detectable in upper and lower respiratory specimens during the acute phase of infection. Positive results are indicative of the presence of SARS-CoV-2 RNA. Clinical correlation with patient history and other diagnostic information is  necessary to determine patient infection status. Positive results do not rule out bacterial infection or co-infection with other viruses.  The expected result is Negative. Fact Sheet for Patients: SugarRoll.be Fact Sheet for Healthcare Providers: https://www.woods-mathews.com/ This test is not yet approved or cleared by the [place] FDA and  has been authorized for detection and/or diagnosis of SARS-CoV-2 by FDA under an Emergency Use Authorization (EUA). This EUA will remain  in effect (meaning this test can be used) for the  duration of the COVID-19 declaration under Section 564(b)(1) of the Act, 21 U.S.C. section 360bbb-3(b)(1), unless the authorization is terminated or revoked sooner. Performed at [facility], [place] [address] [place], [place] [address]   Blood Culture (routine x 2)     Status: None   Collection Time: 06/08/19 12:20 AM   Specimen: BLOOD  Result Value Ref Range Status   Specimen Description BLOOD RIGHT ANTECUBITAL  Final   Special Requests   Final    BOTTLES DRAWN AEROBIC AND ANAEROBIC Blood Culture results may not be optimal due to  an inadequate volume of blood received in culture bottles   Culture   Final    NO GROWTH 5 DAYS Performed at [facility], [place] [address], [place], [place] [address]    Report Status 06/13/2019 FINAL  Final         Radiology Studies: No  results found.      Scheduled Meds: . calcium acetate  2,001 mg Oral TID WC  . Chlorhexidine Gluconate Cloth  6 each Topical Q0600  . Chlorhexidine Gluconate Cloth  6 each Topical Q0600  . dexamethasone (DECADRON) injection  6 mg Intravenous Daily  . doxercalciferol  4.5 mcg Intravenous Q T,Th,Sa-HD  . insulin aspart  0-5 Units Subcutaneous QHS  . insulin aspart  0-9 Units Subcutaneous TID WC  . insulin aspart  2 Units Subcutaneous TID WC  . insulin glargine  8 Units Subcutaneous Daily  . Ipratropium-Albuterol  1 puff Inhalation Q6H  . isosorbide mononitrate  30 mg Oral Daily  . magnesium oxide  400 mg Oral QHS  . mometasone-formoterol  2 puff Inhalation BID  . sodium bicarbonate  1,300 mg Oral BID  . sodium chloride flush  3 mL Intravenous Q12H  . vitamin B-12  1,000 mcg Oral QPM   Continuous Infusions: . sodium chloride    . sodium chloride       LOS: 8 days    Time spent: 35 minutes.     Blain Pais, MD [facility]   If 7PM-7AM, please contact night-coverage [email or web address] Password [facility] 06/16/2019, 5:16 PM

## 2019-06-16 NOTE — Progress Notes (Signed)
Patient ID: KOBEN DAMAN, male   DOB: [birth year], 79 y.o.   MRN: [id number] S: No overnight events by chart review -  Is due for dialysis today     O:BP 91/76 (BP Location: Left Arm)   Pulse 69   Temp 98.4 F (36.9 C)   Resp 20   Wt 90.4 kg   SpO2 94%   BMI 27.80 kg/m   Intake/Output Summary (Last 24 hours) at 06/16/2019 0845 Last data filed at 06/15/2019 2143 Gross per 24 hour  Intake 3 ml  Output --  Net 3 ml   Intake/Output: I/O last 3 completed shifts: In: 3 [I.V.:3] Out: -   Intake/Output this shift:  No intake/output data recorded. Weight change:  Physical exam: did not complete due to COVID + status.  In order to preserve PPE equipment and to minimize exposure to providers.  Notes from other caregivers reviewed   Recent Labs  Lab 06/10/19 0400 06/11/19 0512 06/12/19 1231 06/13/19 0533 06/14/19 0438 06/15/19 0335 06/16/19 0624  NA 140 140 136 140 141 143 143  K 4.7 4.6 4.3 4.6 4.5 4.2 4.9  CL 96* 96* 92* 95* 97* 97* 99  CO2 17* 15* 20* 19* 20* 23 23  GLUCOSE 333* 187* 146* 110* 126* 47* 95  BUN 105* 124* 90* 106* 122* 87* 103*  CREATININE 14.07* 14.94* 12.17* 13.52* 15.01* 12.67* 14.81*  ALBUMIN 2.5* 2.6* 2.6* 2.6* 2.5* 2.6* 2.5*  CALCIUM 7.1* 7.4* 8.0* 8.5* 8.5* 8.9 8.8*  PHOS  --   --   --   --  >30.0* 9.1* 10.4*  AST 40 33 32 28  --   --   --   ALT 38 34 32 31  --   --   --    Liver Function Tests: Recent Labs  Lab 06/11/19 0512 06/12/19 1231 06/13/19 0533 06/14/19 0438 06/15/19 0335 06/16/19 0624  AST 33 32 28  --   --   --   ALT 34 32 31  --   --   --   ALKPHOS 87 95 95  --   --   --   BILITOT 0.5 0.6 0.5  --   --   --   PROT 6.4* 6.4* 6.6  --   --   --   ALBUMIN 2.6* 2.6* 2.6* 2.5* 2.6* 2.5*   No results for input(s): LIPASE, AMYLASE in the last 168 hours. No results for input(s): AMMONIA in the last 168 hours. CBC: Recent Labs  Lab 06/12/19 1049 06/13/19 0533 06/14/19 0438 06/15/19 0335 06/16/19 0624  WBC 5.4 4.0 3.9* 5.1 6.3   NEUTROABS 3.9 2.6 2.4 3.5 4.6  HGB 12.6* 11.8* 12.2* 12.2* 12.1*  HCT 39.8 37.3* 38.8* 40.2 40.7  MCV 85.4 85.9 85.5 87.4 90.4  PLT 181 168 183 205 189   Cardiac Enzymes: No results for input(s): CKTOTAL, CKMB, CKMBINDEX, TROPONINI in the last 168 hours. CBG: Recent Labs  Lab 06/14/19 2139 06/15/19 0933 06/15/19 1300 06/15/19 1702 06/15/19 2127  GLUCAP 278* 82 152* 169* 143*    Iron Studies:  No results for input(s): IRON, TIBC, TRANSFERRIN, FERRITIN in the last 72 hours. Studies/Results: No results found. . calcium acetate  1,334 mg Oral TID WC  . Chlorhexidine Gluconate Cloth  6 each Topical Q0600  . Chlorhexidine Gluconate Cloth  6 each Topical Q0600  . dexamethasone (DECADRON) injection  6 mg Intravenous Daily  . doxercalciferol  4.5 mcg Intravenous Q T,Th,Sa-HD  . insulin aspart  0-5 Units  Subcutaneous QHS  . insulin aspart  0-9 Units Subcutaneous TID WC  . insulin aspart  2 Units Subcutaneous TID WC  . insulin glargine  8 Units Subcutaneous Daily  . Ipratropium-Albuterol  1 puff Inhalation Q6H  . isosorbide mononitrate  30 mg Oral Daily  . magnesium oxide  400 mg Oral QHS  . mometasone-formoterol  2 puff Inhalation BID  . sodium bicarbonate  1,300 mg Oral BID  . sodium chloride flush  3 mL Intravenous Q12H  . vitamin B-12  1,000 mcg Oral QPM    BMET    Component Value Date/Time   NA 143 06/16/2019 0624   NA 137 06/30/2012 1305   K 4.9 06/16/2019 0624   K 4.1 06/30/2012 1305   CL 99 06/16/2019 0624   CL 106 06/30/2012 1305   CO2 23 06/16/2019 0624   CO2 24 06/30/2012 1305   GLUCOSE 95 06/16/2019 0624   BUN 103 (H) 06/16/2019 0624   BUN 62 (A) 06/30/2012 1305   CREATININE 14.81 (H) 06/16/2019 0624   CREATININE 4.09 (H) 07/23/2012 0941   CALCIUM 8.8 (L) 06/16/2019 0624   CALCIUM 8.1 06/30/2012 1305   GFRNONAA 3 (L) 06/16/2019 0624   GFRAA 3 (L) 06/16/2019 0624   CBC    Component Value Date/Time   WBC 6.3 06/16/2019 0624   RBC 4.50 06/16/2019 0624    HGB 12.1 (L) 06/16/2019 0624   HCT 40.7 06/16/2019 0624   HCT 32 06/29/2012 1308   PLT 189 06/16/2019 0624   MCV 90.4 06/16/2019 0624   MCH 26.9 06/16/2019 0624   MCHC 29.7 (L) 06/16/2019 0624   RDW 15.7 (H) 06/16/2019 0624   LYMPHSABS 1.0 06/16/2019 0624   MONOABS 0.5 06/16/2019 0624   EOSABS 0.1 06/16/2019 0624   BASOSABS 0.0 06/16/2019 0624    Dialysis Access: Left AVF  Dialysis Orders: Center: [facility]  on MWF . EDW 94.5kg HD [place] 1K/2.5Ca  Time 4 hours Heparin 1600units IVP bolus then 1400 units/hr. Access LAVF BFR 350 DFR 600    Hectoral 4.5 mcg IV/HD Venofer  50 mg weekly    Assessment/Plan: 1.  Acute hypoxic respiratory failure due to Covid-19 pneumonia- started  decadron and given remdesivir. Weaning  O2  as able-  Has a normal o2 req from COPD 2. ESRD -   HD completed 1/10 AM.  On MWF outpt - will convert to that schedule prior to d/c but if needs COVID shift will be TTS. He is well below EDW but seems to be benefiting from more UF .    next HD today 1/12 via AVF 3.  Hypertension/volume  - normotensive now.  Follow with UF 4.  Anemia  - No esa due to Hgb >12 5.  Metabolic bone disease -  Cont with home meds- hect and phoslo; noted phos up- will inc phoslo to 3 with meals 6.  Nutrition -  Renal diet. 7. DM- per primary svc. 8. COPD- was on home o2 prior to admission.   talk of SNF - pt has agreed

## 2019-06-16 NOTE — [place] FL2 (Addendum)
[place] MEDICAID FL2 LEVEL OF CARE SCREENING TOOL     IDENTIFICATION  Patient Name: Thomas [name] Birthdate: [birth year] Sex: male Admission Date (Current Location): 06/07/2019  [facility] and [place] Number:  Herbalist and Address:  [facility], [place] [address] [place], [place] [address]      Provider Number: [id number]  Attending Physician Name and Address:  Blain Pais, MD  Relative Name and Phone Number:  Toney Reil (daughter) [phone]    Current Level of Care: Hospital Recommended Level of Care: [place] Prior Approval Number:    Date Approved/Denied: 09/05/15 PASRR Number: [id number]  Discharge Plan: SNF    Current Diagnoses: Patient Active Problem List   Diagnosis Date Noted  . Pneumonia due to COVID-19 virus 06/09/2019  . Sepsis ([place]) 06/08/2019  . Thrombocytopenia ([place]) 06/08/2019  . Chronic respiratory failure with hypoxia ([place]) 06/08/2019  . COPD (chronic obstructive pulmonary disease) ([place])   . HTN (hypertension)   . COVID-19 virus infection   . Volume overload 01/26/2019  . Pneumonia 01/25/2019  . Generalized weakness 01/24/2019  . Congestive heart failure ([place]) 05/11/2016  . Venous stenosis of left upper extremity 05/11/2016  . End stage renal disease ([place]) 06/19/2013  . Rectal bleeding 11/27/2012  . Upper abdominal pain 07/07/2012  . Anemia 07/07/2012  . Chronic nausea 07/07/2012  . Microscopic colitis 03/07/2012  . Diarrhea of presumed infectious origin 02/01/2010  . DIVERTICULITIS, HX OF 02/01/2010  . CONSTIPATION 09/10/2008  . ABDOMINAL PAIN, LOWER 09/10/2008  . Insulin-requiring or dependent type II diabetes mellitus ([place]) 09/09/2008  . CIGARETTE SMOKER 09/09/2008  . CORONARY ARTERY DISEASE 09/09/2008  . 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 Esophageal reflux 09/09/2008  . HIATAL HERNIA 09/09/2008  . HOARSENESS 09/09/2008    Orientation RESPIRATION BLADDER Height & Weight      Self, Time, Situation, Place  O2(currently on venturi mask - chronic O2 at 4-5 liters at night and prn) Continent Weight: 90.4 kg Height:     BEHAVIORAL SYMPTOMS/MOOD NEUROLOGICAL BOWEL NUTRITION STATUS      Continent    AMBULATORY STATUS COMMUNICATION OF NEEDS Skin   Extensive Assist Verbally Skin abrasions(mid abdomen excoriations)                       Personal Care Assistance Level of Assistance  Bathing, Dressing, Feeding Bathing Assistance: Maximum assistance Feeding assistance: Limited assistance Dressing Assistance: Maximum assistance     Functional Limitations Info  Sight, Speech, Hearing Sight Info: Adequate Hearing Info: Adequate Speech Info: Adequate    SPECIAL CARE FACTORS FREQUENCY  PT (By licensed PT), OT (By licensed OT)     PT Frequency: PT at snf to assess and treat a min of 5x/week OT Frequency: OT at snf to assess and treat a min of 5x/week            Contractures Contractures Info: Not present    Additional Factors Info  Code Status, Allergies Code Status Info: Full Allergies Info: statins, codeine, tape           Current Medications (06/16/2019):  This is the current hospital active medication list Current Facility-Administered Medications  Medication Dose Route Frequency Provider Last Rate Last Admin  . 0.9 %  sodium chloride infusion  100 mL Intravenous PRN Donato Heinz, MD      . 0.9 %  sodium chloride infusion  100 mL Intravenous PRN Donato Heinz, MD      . ALPRAZolam (  XANAX) tablet 1 mg  1 mg Oral QHS PRN Regalado, Belkys A, MD   1 mg at 06/09/19 0056  . alteplase (CATHFLO ACTIVASE) injection 2 mg  2 mg Intracatheter Once PRN Donato Heinz, MD      . calcium acetate (PHOSLO) capsule 2,001 mg  2,001 mg Oral TID WC Corliss Parish, MD      . Chlorhexidine Gluconate Cloth 2 % PADS 6 each  6 each Topical Q0600 Donato Heinz, MD   6 each at 06/16/19 0435  . Chlorhexidine Gluconate Cloth 2 % PADS 6 each  6 each  Topical Q0600 Corliss Parish, MD      . dexamethasone (DECADRON) injection 6 mg  6 mg Intravenous Daily Opyd, Ilene Qua, MD   6 mg at 06/16/19 5361  . doxercalciferol (HECTOROL) injection 4.5 mcg  4.5 mcg Intravenous Q T,Th,Sa-HD Corliss Parish, MD      . heparin injection 1,000 Units  1,000 Units Dialysis PRN Donato Heinz, MD      . heparin injection 1,900 Units  20 Units/kg Dialysis PRN Donato Heinz, MD      . insulin aspart (novoLOG) injection 0-5 Units  0-5 Units Subcutaneous QHS Vianne Bulls, MD   3 Units at 06/14/19 2232  . insulin aspart (novoLOG) injection 0-9 Units  0-9 Units Subcutaneous TID WC Opyd, Ilene Qua, MD   2 Units at 06/15/19 1729  . insulin aspart (novoLOG) injection 2 Units  2 Units Subcutaneous TID WC Opyd, Ilene Qua, MD   2 Units at 06/15/19 1730  . insulin glargine (LANTUS) injection 8 Units  8 Units Subcutaneous Daily Blain Pais, MD   8 Units at 06/16/19 0825  . Ipratropium-Albuterol (COMBIVENT) respimat 1 puff  1 puff Inhalation Q6H Opyd, Ilene Qua, MD   1 puff at 06/16/19 0822  . isosorbide mononitrate (IMDUR) 24 hr tablet 30 mg  30 mg Oral Daily Regalado, Belkys A, MD   30 mg at 06/16/19 4431  . lidocaine (PF) (XYLOCAINE) 1 % injection 5 mL  5 mL Intradermal PRN Donato Heinz, MD      . magnesium oxide (MAG-OX) tablet 400 mg  400 mg Oral QHS Regalado, Belkys A, MD   400 mg at 06/15/19 2144  . mometasone-formoterol (DULERA) 100-5 MCG/ACT inhaler 2 puff  2 puff Inhalation BID Opyd, Ilene Qua, MD   2 puff at 06/16/19 0825  . ondansetron (ZOFRAN) injection 4 mg  4 mg Intravenous Q6H PRN Bodenheimer, Charles A, NP   4 mg at 06/13/19 0227  . sodium bicarbonate tablet 1,300 mg  1,300 mg Oral BID Regalado, Belkys A, MD   1,300 mg at 06/16/19 0821  . sodium chloride flush (NS) 0.9 % injection 3 mL  3 mL Intravenous Q12H Opyd, Ilene Qua, MD   3 mL at 06/16/19 0826  . vitamin B-12 (CYANOCOBALAMIN) tablet 1,000 mcg  1,000 mcg Oral QPM  Regalado, Belkys A, MD   1,000 mcg at 06/15/19 1728     Discharge Medications: Please see discharge summary for a list of discharge medications.  Relevant Imaging Results:  Relevant Lab Results: Covid positive 06/08/2019  Additional Information SSN# [id number]; HD MWF [facility]  Bartholomew Crews, RN

## 2019-06-17 DIAGNOSIS — J9621 Acute and chronic respiratory failure with hypoxia: Secondary | ICD-10-CM

## 2019-06-17 DIAGNOSIS — J1282 Pneumonia due to coronavirus disease 2019: Secondary | ICD-10-CM

## 2019-06-17 DIAGNOSIS — Z992 Dependence on renal dialysis: Secondary | ICD-10-CM

## 2019-06-17 LAB — RENAL FUNCTION PANEL
Albumin: 2.9 g/dL — ABNORMAL LOW (ref 3.5–5.0)
Anion gap: 20 — ABNORMAL HIGH (ref 5–15)
BUN: 70 mg/dL — ABNORMAL HIGH (ref 8–23)
CO2: 25 mmol/L (ref 22–32)
Calcium: 9.2 mg/dL (ref 8.9–10.3)
Chloride: 95 mmol/L — ABNORMAL LOW (ref 98–111)
Creatinine, Ser: 11.24 mg/dL — ABNORMAL HIGH (ref 0.61–1.24)
GFR calc Af Amer: 4 mL/min — ABNORMAL LOW (ref >60)
GFR calc non Af Amer: 4 mL/min — ABNORMAL LOW (ref >60)
Glucose, Bld: 130 mg/dL — ABNORMAL HIGH (ref 70–99)
Phosphorus: 6.5 mg/dL — ABNORMAL HIGH (ref 2.5–4.6)
Potassium: 5.7 mmol/L — ABNORMAL HIGH (ref 3.5–5.1)
Sodium: 140 mmol/L (ref 135–145)

## 2019-06-17 LAB — CBC
HCT: 44.3 % (ref 39.0–52.0)
Hemoglobin: 13.5 g/dL (ref 13.0–17.0)
MCH: 26.5 pg (ref 26.0–34.0)
MCHC: 30.5 g/dL (ref 30.0–36.0)
MCV: 86.9 fL (ref 80.0–100.0)
Platelets: 204 10*3/uL (ref 150–400)
RBC: 5.1 MIL/uL (ref 4.22–5.81)
RDW: 15.4 % (ref 11.5–15.5)
WBC: 7.9 10*3/uL (ref 4.0–10.5)
nRBC: 0 % (ref 0.0–0.2)

## 2019-06-17 LAB — POTASSIUM: Potassium: 5 mmol/L (ref 3.5–5.1)

## 2019-06-17 LAB — GLUCOSE, CAPILLARY: Glucose-Capillary: 134 mg/dL — ABNORMAL HIGH (ref 70–99)

## 2019-06-17 MED ORDER — IPRATROPIUM-ALBUTEROL 20-100 MCG/ACT IN AERS
1.0000 | INHALATION_SPRAY | Freq: Two times a day (BID) | RESPIRATORY_TRACT | Status: DC
  Filled 2019-06-17: qty 4

## 2019-06-17 MED ORDER — INSULIN LISPRO 100 UNIT/ML ~~LOC~~ SOLN: 3.0000 [IU] | Freq: Three times a day (TID) | SUBCUTANEOUS | Status: DC

## 2019-06-17 MED ORDER — LANTUS 100 UNIT/ML ~~LOC~~ SOLN: 8.0000 [IU] | Freq: Every day | SUBCUTANEOUS | Status: DC

## 2019-06-17 NOTE — Progress Notes (Signed)
Physical Therapy Treatment Patient Details Name: Thomas [name] MRN: [id number] DOB: 1941-01-31 Today's Date: 06/17/2019    History of Present Illness 79 yo male admitted with COVID + acute hypoxic respiratory failure, hypertension, anemia and metabolic bone disease PMH ESRD on HD MWF COPD 3L at home, CAD, Neuropathy, nose cancer with nose amputation DM     PT Comments    Pt admitted with above diagnosis. Pt was able to ambulate with rW with Supervision in room.  Will have assist at home and wants to go home.  Updated d/c plan to home with HHPT with family support.  HAs all equipment needed at home.   Pt currently with functional limitations due to endurance deficits. Pt will benefit from skilled PT to increase their independence and safety with mobility to allow discharge to the venue listed below.     Follow Up Recommendations  Home health PT;Supervision/Assistance - 24 hour     Equipment Recommendations  None recommended by PT    Recommendations for Other Services       Precautions / Restrictions Precautions Precautions: Fall Restrictions Weight Bearing Restrictions: No    Mobility  Bed Mobility               General bed mobility comments: In bathroom on arrival with trail of BM on floor as pt had walked to bathroom on his own with his rW.    Transfers Overall transfer level: Needs assistance Equipment used: Rolling walker (2 wheeled) Transfers: Sit to/from Stand Sit to Stand: Independent         General transfer comment: Pt had walked into bathroom and was on toilet having BM.  Pt finished and stood without assist by PT.  PT cleaned pt as he will have help withthis at home.  Pt then walked back to recliner with Modif I with RW.  Changed pts socks and gown as it had BM on it.   Ambulation/Gait Ambulation/Gait assistance: Supervision Gait Distance (Feet): 20 Feet Assistive device: Rolling walker (2 wheeled) Gait Pattern/deviations: Step-through  pattern;Decreased stride length   Gait velocity interpretation: 1.31 - 2.62 ft/sec, indicative of limited community ambulator General Gait Details: Pt ambulates with device without assist and without cues.  Sat down safely into recliner as well.     Stairs Stairs: (Pt has ramp at home)           Wheelchair Mobility    Modified Rankin (Stroke Patients Only)       Balance Overall balance assessment: Needs assistance Sitting-balance support: No upper extremity supported;Feet supported Sitting balance-Leahy Scale: Good     Standing balance support: No upper extremity supported;During functional activity Standing balance-Leahy Scale: Fair Standing balance comment: can stand statically without UE support while PT cleaning pt                            Cognition Arousal/Alertness: Awake/alert Behavior During Therapy: WFL for tasks assessed/performed Overall Cognitive Status: Within Functional Limits for tasks assessed                                 General Comments: talkative and needed redirection       Exercises      General Comments        Pertinent Vitals/Pain Pain Assessment: Faces Faces Pain Scale: Hurts little more Pain Location: buttocks  Pain Descriptors / Indicators: Discomfort;Grimacing;Moaning Pain Intervention(s): Limited  activity within patient's tolerance;Monitored during session;Repositioned    Home Living                      Prior Function            PT Goals (current goals can now be found in the care plan section) Acute Rehab PT Goals Patient Stated Goal: go home Progress towards PT goals: Progressing toward goals    Frequency    Min 2X/week      PT Plan Discharge plan needs to be updated    Co-evaluation              AM-PAC PT "6 Clicks" Mobility   Outcome Measure  Help needed turning from your back to your side while in a flat bed without using bedrails?: None Help needed moving  from lying on your back to sitting on the side of a flat bed without using bedrails?: None Help needed moving to and from a bed to a chair (including a wheelchair)?: None Help needed standing up from a chair using your arms (e.g., wheelchair or bedside chair)?: None Help needed to walk in hospital room?: None Help needed climbing 3-5 steps with a railing? : Total 6 Click Score: 21    End of Session Equipment Utilized During Treatment: Oxygen(6 L face mask at night) Activity Tolerance: Patient tolerated treatment well Patient left: with call bell/phone within reach;in chair Nurse Communication: Mobility status PT Visit Diagnosis: Unsteadiness on feet (R26.81);Other abnormalities of gait and mobility (R26.89)     Time: 6546-5035 PT Time Calculation (min) (ACUTE ONLY): 26 min  Charges:  $Gait Training: 8-22 mins $Self Care/Home Management: 8-22                     Kierra Jezewski W,PT [place] Pager:  [phone]  Office:  [place] 06/17/2019, 9:51 AM

## 2019-06-17 NOTE — Discharge Summary (Signed)
Physician Discharge Summary  KENNER LEWAN QQP:[id number] DOB: July 14, 1940  PCP: Medicine, [facility]  Admitted from: Home Discharged to: Home.  Patient and family declined SNF that had been recommended by therapies evaluation.  Admit date: 06/07/2019 Discharge date: 06/17/2019  Recommendations for Outpatient Follow-up:   Follow-up Information    [facility].   Specialty: [place] Why: The office will call to schedule visits for physical therapy, occupational therapy, and an aide Contact information: [place] [address] [phone]        Medicine, [facility]. Schedule an appointment as soon as possible for a visit in 1 week(s).   Specialty: Internal Medicine Why: To be seen with repeat labs (CBC & CMP).  Labs can be drawn across HD. Contact information: [place] [address] [phone]        [facility] in [place] for outpatient hemodialysis. Follow up on 06/18/2019.   Why: Arrive by 12 noon.  Continue regular 3 times a week hemodialysis.           Home Health: PT, OT and home health aide. Equipment/Devices: None.  Discharge Condition: Improved and stable CODE STATUS: Full. Diet recommendation: Heart healthy & diabetic diet.  Discharge Diagnoses:  Principal Problem:   Pneumonia Active Problems:   Insulin-requiring or dependent type II diabetes mellitus ([place])   Diarrhea of presumed infectious origin   End stage renal disease (HCC)   Sepsis ([place])   Thrombocytopenia ([place])   Chronic respiratory failure with hypoxia (HCC)   COPD (chronic obstructive pulmonary disease) (HCC)   HTN (hypertension)   COVID-19 virus infection   Pneumonia due to COVID-19 virus   Brief Summary: 79 year old male with past medical history of ESRD on MWF hemodialysis, COPD, chronic hypoxic respiratory failure on home oxygen that he uses via facemask due to loss of nasal cartilage/tissue and inability to use nasal  cannula oxygen, CAD, type II DM/IDDM, hypertension, 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  His symptoms reportedly began on 06/03/2019 with loose stools and generalized weakness.  His chronic dyspnea progressively got worse.  He had a negative rapid Covid test on 06/05/2019.  Spouse reported chronic jerky movements that worsened over the week prior to admission.  Evaluation in ED found patient to be hypoxic with oxygen saturations in the 90s on his usual 2-4 L/min of oxygen.  Chest x-ray was consistent with fluid overload interstitial edema and possible pneumonia.  Covid antigen testing was positive.  Assessment and plan:  1. Acute on chronic hypoxic respiratory failure due to COVID-19 pneumonia: Patient completed a 5 days course of IV remdesivir, 10 days course of IV Decadron and 7 days course of IV ceftriaxone and azithromycin.  He had been on oxygen up to 15 L/min via facemask here.  His acute respiratory failure has resolved.  Currently saturating at 91% on room air this morning.  Continue prior home oxygen.  As per discussion with case management, multiple family members also had Covid and are well aware of quarantine restrictions which were advised to them. 2. COPD with chronic hypoxic respiratory failure: Reportedly on home oxygen 2 to 4 L/min via facemask.  At this time no clinical bronchospasm.  Continue prior home albuterol nebulization/inhaler, Stiolto Respimat.  Discontinued oral albuterol due to risk of tachycardia in an elderly male.  He has not received this in the hospital. 3. ESRD on  MWF hemodialysis: Nephrology was consulted for dialysis needs.  He received dialysis late on night prior to discharge.  Reportedly at discharge, he will be on TTS HD.  Nephrology team has arranged for outpatient dialysis beginning tomorrow at noon.  I confirmed with Nephrologist today and okay to discharge  home from their standpoint. 4. Hyperkalemia: Potassium 5.7 this morning.  Suspect due to hemolysis.  This was repeated and potassium was 5.  Follow BMP across HD as outpatient. 5. Thrombocytopenia: Suspected due to acute illness/Covid.  Resolved. 6. Type II DM/IDDM with renal complications: W2O 7.9.  Due to episode of hypoglycemia, insulins were reduced in the hospital.  Close outpatient follow-up with PCP at which time insulins can be adjusted as needed. 7. 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  He was on gabapentin and primidone prior to admission which were held in the hospital and not continued at discharge.  Again close outpatient follow-up with PCP to determine if these need to be resumed. 8. Essential hypertension: Remains only on Imdur.  Blood pressures are mildly uncontrolled at times but reasonable.  Other polypharmacy antihypertensives were discontinued.  Close outpatient follow-up with PCP and across dialysis to determine if some of these may need to be resumed. 9. Metabolic acidosis: Resolved after HD.  Sodium bicarbonate that was initiated in the hospital was discontinued at discharge. 10. Physical debility: Likely due to acute illness in the context of advanced age and multiple comorbidities.  Physical therapy recommended SNF but patient and family declined.  Case management was able to arrange home health services and his family will be able to provide supervision and care. 11. Diarrhea: Resolved. 12. 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 13. Metabolic bone disease: Continue home medications and close outpatient follow-up with nephrology.   Consultations:  Nephrology  Procedures:  Hemodialysis   Discharge Instructions  Discharge Instructions    Call MD for:  difficulty breathing, headache or visual disturbances   Complete by: As directed    Call MD for:  extreme fatigue   Complete by: As directed    Call MD for:   persistant dizziness or light-headedness   Complete by: As directed    Call MD for:  persistant nausea and vomiting   Complete by: As directed    Call MD for:  severe uncontrolled pain   Complete by: As directed    Call MD for:  temperature >100.4   Complete by: As directed    Diet - low sodium heart healthy   Complete by: As directed    Diet Carb Modified   Complete by: As directed    Increase activity slowly   Complete by: As directed        Medication List    STOP taking these medications   amLODipine 10 MG tablet Commonly known as: NORVASC   gabapentin 300 MG capsule Commonly known as: NEURONTIN   hydrochlorothiazide 12.5 MG tablet Commonly known as: HYDRODIURIL   losartan 100 MG tablet Commonly known as: COZAAR   metoprolol tartrate 100 MG tablet Commonly known as: LOPRESSOR   oxyCODONE-acetaminophen 5-325 MG tablet Commonly known as: PERCOCET/ROXICET   primidone 50 MG tablet Commonly known as: MYSOLINE     TAKE these medications   acetaminophen 500 MG tablet Commonly known as: TYLENOL Take 1,000 mg by mouth every 6 (six) hours as needed for mild pain or headache.   albuterol 108 (90 Base) MCG/ACT inhaler Commonly known as: VENTOLIN HFA Inhale 1-2 puffs  into the lungs every 4 (four) hours as needed for wheezing or shortness of breath. What changed: Another medication with the same name was removed. Continue taking this medication, and follow the directions you see here.   albuterol (2.5 MG/3ML) 0.083% nebulizer solution Commonly known as: PROVENTIL Take 2.5 mg by nebulization every 6 (six) hours as needed for wheezing or shortness of breath. What changed: Another medication with the same name was removed. Continue taking this medication, and follow the directions you see here.   ALPRAZolam 1 MG tablet Commonly known as: XANAX Take 1 mg by mouth at bedtime as needed for sleep.   b complex-vitamin c-folic acid 0.8 MG Tabs tablet Take 1 tablet by mouth  at bedtime.   CALCIUM + D3 PO Take 1 tablet by mouth daily.   calcium acetate 667 MG capsule Commonly known as: PHOSLO Take 667-1,334 mg by mouth See admin instructions. Take 2 capsules (1334 mg) by mouth with each meal & take 1 capsule (667 mg) by mouth with each snack.   dicyclomine 20 MG tablet Commonly known as: BENTYL Take 20 mg by mouth See admin instructions. Take 1 tablet (20 mg) by mouth scheduled at night, may take up to 3 additional doses as needed for abdominal issues .   diphenhydrAMINE 25 MG tablet Commonly known as: BENADRYL Take 25 mg by mouth every Monday, Wednesday, and Friday.   diphenhydrAMINE-zinc acetate cream Commonly known as: BENADRYL Apply topically 2 (two) times daily as needed for itching.   Fish Oil 1000 MG Caps Take 2,000 mg by mouth 2 (two) times a day.   hydrocortisone cream 1 % Apply 1 application topically 2 (two) times daily as needed for itching.   insulin lispro 100 UNIT/ML injection Commonly known as: HUMALOG Inject 0.03 mLs (3 Units total) into the skin 3 (three) times daily with meals. What changed: how much to take   isosorbide mononitrate 30 MG 24 hr tablet Commonly known as: IMDUR Take 30 mg by mouth daily.   Lantus 100 UNIT/ML injection Generic drug: insulin glargine Inject 0.08 mLs (8 Units total) into the skin daily. Start taking on: June 18, 2019 What changed:   how much to take  when to take this   lidocaine-prilocaine cream Commonly known as: EMLA Apply 1 application topically as needed (port access).   loperamide 1 MG/5ML solution Commonly known as: IMODIUM Take 1-2 mg by mouth as needed for diarrhea or loose stools.   magnesium oxide 400 MG tablet Commonly known as: MAG-OX Take 400 mg by mouth at bedtime.   Nitrostat 0.4 MG SL tablet Generic drug: nitroGLYCERIN Place 0.4 mg under the tongue every 5 (five) minutes as needed for chest pain.   omeprazole 20 MG capsule Commonly known as: PRILOSEC Take 20  mg by mouth 2 (two) times daily.   ondansetron 4 MG tablet Commonly known as: ZOFRAN Take 4 mg by mouth every 6 (six) hours as needed for nausea or vomiting.   REDNESS RELIEF OP Place 1 drop into both eyes daily as needed (redness/ dryness).   Stiolto Respimat 2.5-2.5 MCG/ACT Aers Generic drug: Tiotropium Bromide-Olodaterol Inhale 2 puffs into the lungs at bedtime.   vitamin B-12 1000 MCG tablet Commonly known as: CYANOCOBALAMIN Take 1,000 mcg by mouth every evening.   Vitamin D3 50 MCG (2000 UT) capsule Take 2,000 Units by mouth 2 (two) times daily.      Allergies  Allergen Reactions  . Ace Inhibitors Anaphylaxis    Swelling in throat and  neck  . Statins Anaphylaxis and Swelling    Caused feet, tongue and throat to swell.  . Codeine Other (See Comments)    Muscle Spasms  . Tape Hives      Procedures/Studies: DG CHEST PORT 1 VIEW  Result Date: 06/10/2019 CLINICAL DATA:  79 year old male positive COVID-19. Pneumonia. Dialysis patient. EXAM: PORTABLE CHEST 1 VIEW COMPARISON:  06/07/2019 portable chest and earlier. FINDINGS: Portable AP semi upright view at 0713 hours. Stable lung volumes and mediastinal contours. Left perihilar and widespread left mid lung patchy opacity has mildly progressed since 06/07/2019. Mild coarse interstitial markings elsewhere appear stable. There is now trace fluid or thickening along the right minor fissure, but no other pleural effusion is evident. No pneumothorax. Visualized tracheal air column is within normal limits. IMPRESSION: 1. Mild progression of COVID-19 pneumonia in the left lung since 06/07/2019. 2. Trace new pleural fluid, but pulmonary vascularity appears stable. Electronically Signed   By: Genevie Ann M.D.   On: 06/10/2019 08:51   DG Chest Port 1 View  Result Date: 06/07/2019 CLINICAL DATA:  Weakness.  Hypotension.  Dialysis patient. EXAM: PORTABLE CHEST 1 VIEW COMPARISON:  04/23/2019 FINDINGS: Previously seen right internal jugular  catheter has been removed. Mild cardiomegaly as seen previously. Abnormal interstitial pulmonary density probably indicating fluid overload/congestive heart failure. This is asymmetrically more pronounced on the left, and left lung pneumonia is not excluded. IMPRESSION: Probable fluid overload/interstitial edema. Findings are asymmetrically more pronounced on the left, raising the possibility pneumonia. Electronically Signed   By: Nelson Chimes M.D.   On: 06/07/2019 21:53       Subjective: Patient denies complaints.  Denies dyspnea, chest pain or pain elsewhere, dizziness or lightheadedness.  No cough reported.  Reports that he is doing well.  Anxious to go home.  As per RN, no acute issues reported.  Discharge Exam:  Vitals:   06/17/19 0330 06/17/19 0400 06/17/19 0423 06/17/19 0757  BP: (!) 131/33 (!) 89/34 (!) 165/52 (!) 132/54  Pulse: 87 97 86   Resp:   16 18  Temp:   (!) 97.5 F (36.4 C) 97.8 F (36.6 C)  TempSrc:   Oral Oral  SpO2:    91%  Weight:        General: Pleasant elderly male, moderately built and nourished sitting up comfortably in chair without distress.  Was noted off oxygen but was getting ready to put on his face mask. Cardiovascular: S1 & S2 heard, RRR, S1/S2 +. No murmurs, rubs, gallops or clicks. No JVD or pedal edema.  Telemetry personally reviewed: Sinus rhythm. Respiratory: Clear to auscultation without wheezing, rhonchi or crackles. No increased work of breathing. Abdominal:  Non distended, non tender & soft. No organomegaly or masses appreciated. Normal bowel sounds heard. CNS: Alert and oriented. No focal deficits. Extremities: no edema, no cyanosis.  Left upper arm AV fistula thrill appreciated. ENT: missing nose s/p surgery.    The results of significant diagnostics from this hospitalization (including imaging, microbiology, ancillary and laboratory) are listed below for reference.     Microbiology: Recent Results (from the past 240 hour(s))  Blood  Culture (routine x 2)     Status: None   Collection Time: 06/07/19 10:26 PM   Specimen: BLOOD RIGHT HAND  Result Value Ref Range Status   Specimen Description BLOOD RIGHT HAND  Final   Special Requests   Final    BOTTLES DRAWN AEROBIC AND ANAEROBIC Blood Culture results may not be optimal due to an inadequate volume  of blood received in culture bottles   Culture   Final    NO GROWTH 5 DAYS Performed at [facility], [place] [address], [place], [place] [address]    Report Status 06/12/2019 FINAL  Final  SARS CORONAVIRUS 2 (TAT 6-24 HRS) Nasopharyngeal Nasopharyngeal Swab     Status: Abnormal   Collection Time: 06/07/19 10:30 PM   Specimen: Nasopharyngeal Swab  Result Value Ref Range Status   SARS Coronavirus 2 POSITIVE (A) NEGATIVE Final    Comment: RESULT CALLED TO, READ BACK BY AND VERIFIED WITH: K. MOON,RN 0340 06/08/2019 T. TYSOR (NOTE) SARS-CoV-2 target nucleic acids are DETECTED. The SARS-CoV-2 RNA is generally detectable in upper and lower respiratory specimens during the acute phase of infection. Positive results are indicative of the presence of SARS-CoV-2 RNA. Clinical correlation with patient history and other diagnostic information is  necessary to determine patient infection status. Positive results do not rule out bacterial infection or co-infection with other viruses.  The expected result is Negative. Fact Sheet for Patients: SugarRoll.be Fact Sheet for Healthcare Providers: https://www.woods-mathews.com/ This test is not yet approved or cleared by the [place] FDA and  has been authorized for detection and/or diagnosis of SARS-CoV-2 by FDA under an Emergency Use Authorization (EUA). This EUA will remain  in effect (meaning this test can be used) for the  duration of the COVID-19 declaration under Section 564(b)(1) of the Act, 21 U.S.C. section 360bbb-3(b)(1), unless the authorization is terminated or revoked  sooner. Performed at [facility], [place] [address], [place], [place] [address]   Blood Culture (routine x 2)     Status: None   Collection Time: 06/08/19 12:20 AM   Specimen: BLOOD  Result Value Ref Range Status   Specimen Description BLOOD RIGHT ANTECUBITAL  Final   Special Requests   Final    BOTTLES DRAWN AEROBIC AND ANAEROBIC Blood Culture results may not be optimal due to an inadequate volume of blood received in culture bottles   Culture   Final    NO GROWTH 5 DAYS Performed at [facility], [place] [address], [place], [place] [address]    Report Status 06/13/2019 FINAL  Final     Labs: CBC: Recent Labs  Lab 06/12/19 1049 06/13/19 0533 06/14/19 0438 06/15/19 0335 06/16/19 0624 06/17/19 0541  WBC 5.4 4.0 3.9* 5.1 6.3 7.9  NEUTROABS 3.9 2.6 2.4 3.5 4.6  --   HGB 12.6* 11.8* 12.2* 12.2* 12.1* 13.5  HCT 39.8 37.3* 38.8* 40.2 40.7 44.3  MCV 85.4 85.9 85.5 87.4 90.4 86.9  PLT 181 168 183 205 189 540    Basic Metabolic Panel: Recent Labs  Lab 06/13/19 0533 06/14/19 0438 06/15/19 0335 06/16/19 0624 06/17/19 0638 06/17/19 1046  NA 140 141 143 143 140  --   K 4.6 4.5 4.2 4.9 5.7* 5.0  CL 95* 97* 97* 99 95*  --   CO2 19* 20* 23 23 25   --   GLUCOSE 110* 126* 47* 95 130*  --   BUN 106* 122* 87* 103* 70*  --   CREATININE 13.52* 15.01* 12.67* 14.81* 11.24*  --   CALCIUM 8.5* 8.5* 8.9 8.8* 9.2  --   PHOS  --  >30.0* 9.1* 10.4* 6.5*  --     Liver Function Tests: Recent Labs  Lab 06/11/19 0512 06/12/19 1231 06/13/19 0533 06/14/19 0438 06/15/19 0335 06/16/19 0624 06/17/19 0638  AST 33 32 28  --   --   --   --  ALT 34 32 31  --   --   --   --   ALKPHOS 87 95 95  --   --   --   --   BILITOT 0.5 0.6 0.5  --   --   --   --   PROT 6.4* 6.4* 6.6  --   --   --   --   ALBUMIN 2.6* 2.6* 2.6* 2.5* 2.6* 2.5* 2.9*    CBG: Recent Labs  Lab 06/16/19 0855 06/16/19 1225 06/16/19 1649 06/16/19 2104 06/17/19 0754  GLUCAP 89 195* 191* 174* 134*   I  attempted to reach patient's daughter via phone but was unsuccessful and no option to leave VM message either.   Time coordinating discharge: 45 minutes  SIGNED:  Vernell Leep, MD, [place], [facility]. [facility]  To contact the attending provider between 7A-7P or the covering provider during after hours 7P-7A, please log into the web site [email or web address] and access using universal  password for that web site. If you do not have the password, please call the hospital operator.

## 2019-06-17 NOTE — TOC Progression Note (Signed)
Transition of Care [facility]) - Progression Note    Patient Details  Name: Thomas [name] MRN: [id number] Date of Birth: 1940/08/01  Transition [facility]) CM/SW Contact  Bartholomew Crews, RN Phone Number: [phone] 06/17/2019, 11:39 AM  Clinical Narrative:    Received call from patient's daughter, Thomas [name]. She has spoken with renal navigator about outpatient dialysis. Discussed PT evaluation today and updated recommendations for home with family. [facility] will follow up after discharge for therapy and aide services - noted orders have already been placed. Family to provide transportation home. Advised that patient is pending written DC order.    Expected Discharge Plan: Skilled Nursing Facility Barriers to Discharge: SNF Pending bed offer  Expected Discharge Plan and Services Expected Discharge Plan: [place] In-house Referral: NA Discharge Planning Services: CM Consult Post Acute Care Choice: [place] Living arrangements for the past 2 months: Single Family Home                 DME Arranged: N/A DME Agency: NA       HH Arranged: NA HH Agency: NA         Social Determinants of Health (SDOH) Interventions    Readmission Risk Interventions Readmission Risk Prevention Plan 01/30/2019  Transportation Screening Complete  PCP or Specialist Appt within 3-5 Days Complete  HRI or Home Care Consult Complete  Social Work Consult for [place] Planning/Counseling Complete  Palliative Care Screening Not Applicable  Medication Review [employer] photographer) Complete  Some recent data might be hidden

## 2019-06-17 NOTE — Progress Notes (Signed)
Occupational Therapy Treatment Patient Details Name: BRIYAN KLEVEN MRN: [id number] DOB: 1941-03-17 Today's Date: 06/17/2019    History of present illness 79 yo male admitted with COVID + acute hypoxic respiratory failure, hypertension, anemia and metabolic bone disease PMH ESRD on HD MWF COPD 3L at home, CAD, Neuropathy, nose cancer with nose amputation DM    OT comments  Patient completing functional mobility and transfers with min guard to supervision today using RW.  Stood at sink to complete hand washing, requiring min cueing for sequencing simple self care tasks.  Min cueing for safety and RW mgmt. VSS during session, but noted decreased activity tolerance and endurance. Assisted pt with dialing daughters number upon exiting room. Will require 24/7 support at discharge for safety, recommend [place] services.    Follow Up Recommendations  Home health OT;Supervision/Assistance - 24 hour    Equipment Recommendations  None recommended by OT(reports having 3:1 commode)    Recommendations for Other Services      Precautions / Restrictions Precautions Precautions: Fall Restrictions Weight Bearing Restrictions: No       Mobility Bed Mobility               General bed mobility comments: in recliner upon entry   Transfers Overall transfer level: Needs assistance Equipment used: Rolling walker (2 wheeled) Transfers: Sit to/from Stand Sit to Stand: Min guard;Supervision         General transfer comment: min guard to close supervision for safety/balance     Balance Overall balance assessment: Needs assistance Sitting-balance support: No upper extremity supported;Feet supported Sitting balance-Leahy Scale: Good     Standing balance support: No upper extremity supported;Bilateral upper extremity supported;During functional activity Standing balance-Leahy Scale: Fair Standing balance comment: able to stand without UE support during hand washing but reliant on BUE support  dynamically                            ADL either performed or assessed with clinical judgement   ADL Overall ADL's : Needs assistance/impaired     Grooming: Min guard;Standing;Wash/dry hands               Lower Body Dressing: Minimal assistance;Sit to/from stand Lower Body Dressing Details (indicate cue type and reason): to adjust socks, min guard to stand  Toilet Transfer: Min guard;Ambulation;RW;Supervision/safety Toilet Transfer Details (indicate cue type and reason): simulated to/from recliner          Functional mobility during ADLs: Min guard;Rolling walker;Supervision/safety General ADL Comments: for safety, walker mgmt      Vision       Perception     Praxis      Cognition Arousal/Alertness: Awake/alert Behavior During Therapy: WFL for tasks assessed/performed Overall Cognitive Status: Impaired/Different from baseline Area of Impairment: Problem solving;Safety/judgement;Memory;Following commands                     Memory: Decreased short-term memory Following Commands: Follows one step commands consistently;Follows one step commands with increased time;Follows multi-step commands inconsistently Safety/Judgement: Decreased awareness of safety   Problem Solving: Decreased initiation;Slow processing;Difficulty sequencing;Requires verbal cues General Comments: min cueing to problem solve through basic self care tasks, difficutly remembering his daughters phone number and requires assist to sequence, recall and dial her phone number         Exercises     Shoulder Instructions       General Comments pt limited by decreased activity tolerance and endurance,  VSS but fatigued     Pertinent Vitals/ Pain       Pain Assessment: Faces Faces Pain Scale: Hurts little more Pain Location: buttocks  Pain Descriptors / Indicators: Discomfort;Grimacing Pain Intervention(s): Limited activity within patient's tolerance;Monitored during  session;Repositioned  Home Living                                          Prior Functioning/Environment              Frequency  Min 2X/week        Progress Toward Goals  OT Goals(current goals can now be found in the care plan section)  Progress towards OT goals: Progressing toward goals  Acute Rehab OT Goals Patient Stated Goal: go home OT Goal Formulation: With patient  Plan Frequency remains appropriate;Discharge plan needs to be updated    Co-evaluation                 AM-PAC OT "6 Clicks" Daily Activity     Outcome Measure   Help from another person eating meals?: A Little Help from another person taking care of personal grooming?: A Little Help from another person toileting, which includes using toliet, bedpan, or urinal?: A Lot Help from another person bathing (including washing, rinsing, drying)?: A Little Help from another person to put on and taking off regular upper body clothing?: A Little Help from another person to put on and taking off regular lower body clothing?: A Little 6 Click Score: 17    End of Session Equipment Utilized During Treatment: Rolling walker  OT Visit Diagnosis: Unsteadiness on feet (R26.81);Muscle weakness (generalized) (M62.81)   Activity Tolerance Patient limited by fatigue   Patient Left in chair;with call bell/phone within reach   Nurse Communication Mobility status        Time: 7425-9563 OT Time Calculation (min): 9 min  Charges: OT General Charges $OT Visit: 1 Visit OT Treatments $Self Care/Home Management : 8-22 mins  Jolaine Artist, OT [place] Pager [phone] Office [phone]    Delight Stare 06/17/2019, 12:23 PM

## 2019-06-17 NOTE — Progress Notes (Signed)
Patient has been set up at [facility] in [place] for OP HD treatment while in [place] for [place]. He can start tomorrow, Thursday, 06/18/19 and needs to arrive at 12:00pm. He should call when he arrives to the parking lot. Renal Navigator called patient's daughter to inform.  Alphonzo Cruise, [place] Renal Navigator [phone]

## 2019-06-17 NOTE — Progress Notes (Signed)
Patient ID: Thomas [name], male   DOB: 1941-05-12, 79 y.o.   MRN: [id number]   S: No overnight events by chart review -  Plan now is to eventually discharge to home.  Had HD in the middle of the night - removed 2200-  K was 5.7 after HD but hemolyzed.  Sat was 91 % on room air this AM    O:BP (!) 132/54 (BP Location: Right Arm)   Pulse 86   Temp 97.8 F (36.6 C) (Oral)   Resp 18   Wt 90.4 kg   SpO2 91%   BMI 27.80 kg/m   Intake/Output Summary (Last 24 hours) at 06/17/2019 0835 Last data filed at 06/17/2019 0423 Gross per 24 hour  Intake 3 ml  Output 2269 ml  Net -2266 ml   Intake/Output: I/O last 3 completed shifts: In: 6 [I.V.:6] Out: 2269 [Other:2269]  Intake/Output this shift:  No intake/output data recorded. Weight change:  Physical exam: did not complete due to COVID + status.  In order to preserve PPE equipment and to minimize exposure to providers.  Notes from other caregivers reviewed   Recent Labs  Lab 06/11/19 0512 06/12/19 1231 06/13/19 0533 06/14/19 0438 06/15/19 0335 06/16/19 0624 06/17/19 0638  NA 140 136 140 141 143 143 140  K 4.6 4.3 4.6 4.5 4.2 4.9 5.7*  CL 96* 92* 95* 97* 97* 99 95*  CO2 15* 20* 19* 20* 23 23 25   GLUCOSE 187* 146* 110* 126* 47* 95 130*  BUN 124* 90* 106* 122* 87* 103* 70*  CREATININE 14.94* 12.17* 13.52* 15.01* 12.67* 14.81* 11.24*  ALBUMIN 2.6* 2.6* 2.6* 2.5* 2.6* 2.5* 2.9*  CALCIUM 7.4* 8.0* 8.5* 8.5* 8.9 8.8* 9.2  PHOS  --   --   --  >30.0* 9.1* 10.4* 6.5*  AST 33 32 28  --   --   --   --   ALT 34 32 31  --   --   --   --    Liver Function Tests: Recent Labs  Lab 06/11/19 0512 06/12/19 1231 06/13/19 0533 06/15/19 0335 06/16/19 0624 06/17/19 0638  AST 33 32 28  --   --   --   ALT 34 32 31  --   --   --   ALKPHOS 87 95 95  --   --   --   BILITOT 0.5 0.6 0.5  --   --   --   PROT 6.4* 6.4* 6.6  --   --   --   ALBUMIN 2.6* 2.6* 2.6* 2.6* 2.5* 2.9*   No results for input(s): LIPASE, AMYLASE in the last 168 hours. No  results for input(s): AMMONIA in the last 168 hours. CBC: Recent Labs  Lab 06/13/19 0533 06/14/19 0438 06/15/19 0335 06/16/19 0624 06/17/19 0541  WBC 4.0 3.9* 5.1 6.3 7.9  NEUTROABS 2.6 2.4 3.5 4.6  --   HGB 11.8* 12.2* 12.2* 12.1* 13.5  HCT 37.3* 38.8* 40.2 40.7 44.3  MCV 85.9 85.5 87.4 90.4 86.9  PLT 168 183 205 189 204   Cardiac Enzymes: No results for input(s): CKTOTAL, CKMB, CKMBINDEX, TROPONINI in the last 168 hours. CBG: Recent Labs  Lab 06/16/19 0855 06/16/19 1225 06/16/19 1649 06/16/19 2104 06/17/19 0754  GLUCAP 89 195* 191* 174* 134*    Iron Studies:  No results for input(s): IRON, TIBC, TRANSFERRIN, FERRITIN in the last 72 hours. Studies/Results: No results found. . calcium acetate  2,001 mg Oral TID WC  . Chlorhexidine Gluconate Cloth  6 each Topical Q0600  . Chlorhexidine Gluconate Cloth  6 each Topical Q0600  . dexamethasone (DECADRON) injection  6 mg Intravenous Daily  . doxercalciferol  4.5 mcg Intravenous Q T,Th,Sa-HD  . insulin aspart  0-5 Units Subcutaneous QHS  . insulin aspart  0-9 Units Subcutaneous TID WC  . insulin aspart  2 Units Subcutaneous TID WC  . insulin glargine  8 Units Subcutaneous Daily  . Ipratropium-Albuterol  1 puff Inhalation Q6H  . isosorbide mononitrate  30 mg Oral Daily  . magnesium oxide  400 mg Oral QHS  . mometasone-formoterol  2 puff Inhalation BID  . sodium bicarbonate  1,300 mg Oral BID  . sodium chloride flush  3 mL Intravenous Q12H  . vitamin B-12  1,000 mcg Oral QPM    BMET    Component Value Date/Time   NA 140 06/17/2019 0638   NA 137 06/30/2012 1305   K 5.7 (H) 06/17/2019 0638   K 4.1 06/30/2012 1305   CL 95 (L) 06/17/2019 0638   CL 106 06/30/2012 1305   CO2 25 06/17/2019 0638   CO2 24 06/30/2012 1305   GLUCOSE 130 (H) 06/17/2019 0638   BUN 70 (H) 06/17/2019 0638   BUN 62 (A) 06/30/2012 1305   CREATININE 11.24 (H) 06/17/2019 0638   CREATININE 4.09 (H) 07/23/2012 0941   CALCIUM 9.2 06/17/2019 0638    CALCIUM 8.1 06/30/2012 1305   GFRNONAA 4 (L) 06/17/2019 0638   GFRAA 4 (L) 06/17/2019 0638   CBC    Component Value Date/Time   WBC 7.9 06/17/2019 0541   RBC 5.10 06/17/2019 0541   HGB 13.5 06/17/2019 0541   HCT 44.3 06/17/2019 0541   HCT 32 06/29/2012 1308   PLT 204 06/17/2019 0541   MCV 86.9 06/17/2019 0541   MCH 26.5 06/17/2019 0541   MCHC 30.5 06/17/2019 0541   RDW 15.4 06/17/2019 0541   LYMPHSABS 1.0 06/16/2019 0624   MONOABS 0.5 06/16/2019 0624   EOSABS 0.1 06/16/2019 0624   BASOSABS 0.0 06/16/2019 0624    Dialysis Access: Left AVF  Dialysis Orders: Center: [facility]  on MWF . EDW 94.5kg HD [place] 1K/2.5Ca  Time 4 hours Heparin 1600units IVP bolus then 1400 units/hr. Access LAVF BFR 350 DFR 600    Hectoral 4.5 mcg IV/HD Venofer  50 mg weekly    Assessment/Plan: 1.  Acute hypoxic respiratory failure due to Covid-19 pneumonia- started  decadron and given remdesivir. Weaning  O2  as able-  Has a normal o2 req from COPD 2. ESRD -   HD completed 1/13 early AM.  On MWF outpt - will convert to that schedule prior to d/c but if needs COVID shift will be TTS. He is well below EDW but seems to be benefiting from more UF .  next HD will be tomorrow 1/14-  Could be here or at OP unit on COVID shift [facility] 3.  Hypertension/volume  - normotensive now.  Follow with UF 4.  Anemia  - No esa due to Hgb >12 5.  Metabolic bone disease -  Cont with home meds- hect and phoslo; noted phos up- inc phoslo to 3 with meals 6.  Nutrition -  Renal diet. 7. DM- per primary svc. 8. COPD- was on home o2 prior to admission.   talk of SNF - pt agreed but now wants to go home with home health-  Note from yest "may be ready for d/c in 1-2 days"  9. Hyperkalemia-  Think was hemolyzed  and not right-  Will recheck

## 2020-01-21 ENCOUNTER — Telehealth: Payer: Self-pay | Admitting: Emergency Medicine

## 2020-01-21 NOTE — Telephone Encounter (Signed)
Was called with request through the patient's nephrologist Dr. Holley Raring that he would like the patient transferred to [facility].   Communicated with Dr. Holley Raring that this would need to be a process work through the Financial risk analyst.  Dr. Holley Raring to contact house supervisor for guidance as hospital has significant capacity problems at this time.  Dr. Corky Downs, Donne Hazel (ED director) made aware. They are advising me we do NOT have capacity at accept patient to ER. This message was relayed to Dr. Holley Raring by myself. I did NOT accept this patient in transfer to the ER as I was advised we do NOT have capacity to provide care for this patient at this time.

## 2020-04-12 ENCOUNTER — Other Ambulatory Visit: Payer: Self-pay

## 2020-04-12 ENCOUNTER — Emergency Department: Payer: Medicare Other

## 2020-04-12 ENCOUNTER — Inpatient Hospital Stay
Admission: EM | Admit: 2020-04-12 | Discharge: 2020-04-19 | DRG: 919 | Disposition: A | Discharge status: Home Health | Payer: Medicare Other | Attending: Internal Medicine | Admitting: Internal Medicine

## 2020-04-12 DIAGNOSIS — K652 Spontaneous bacterial peritonitis: Secondary | ICD-10-CM | POA: Diagnosis present

## 2020-04-12 DIAGNOSIS — M19041 Primary osteoarthritis, right hand: Secondary | ICD-10-CM | POA: Diagnosis present

## 2020-04-12 DIAGNOSIS — T8571XA Infection and inflammatory reaction due to peritoneal dialysis catheter, initial encounter: Secondary | ICD-10-CM | POA: Diagnosis present

## 2020-04-12 DIAGNOSIS — M479 Spondylosis, unspecified: Secondary | ICD-10-CM | POA: Diagnosis present

## 2020-04-12 DIAGNOSIS — Z992 Dependence on renal dialysis: Secondary | ICD-10-CM

## 2020-04-12 DIAGNOSIS — M19042 Primary osteoarthritis, left hand: Secondary | ICD-10-CM | POA: Diagnosis present

## 2020-04-12 DIAGNOSIS — Z8674 Personal history of sudden cardiac arrest: Secondary | ICD-10-CM

## 2020-04-12 DIAGNOSIS — E785 Hyperlipidemia, unspecified: Secondary | ICD-10-CM | POA: Diagnosis present

## 2020-04-12 DIAGNOSIS — J449 Chronic obstructive pulmonary disease, unspecified: Secondary | ICD-10-CM | POA: Diagnosis present

## 2020-04-12 DIAGNOSIS — R4182 Altered mental status, unspecified: Secondary | ICD-10-CM

## 2020-04-12 DIAGNOSIS — D631 Anemia in chronic kidney disease: Secondary | ICD-10-CM | POA: Diagnosis present

## 2020-04-12 DIAGNOSIS — E1122 Type 2 diabetes mellitus with diabetic chronic kidney disease: Secondary | ICD-10-CM | POA: Diagnosis present

## 2020-04-12 DIAGNOSIS — W06XXXA Fall from bed, initial encounter: Secondary | ICD-10-CM | POA: Diagnosis present

## 2020-04-12 DIAGNOSIS — Z794 Long term (current) use of insulin: Secondary | ICD-10-CM

## 2020-04-12 DIAGNOSIS — I509 Heart failure, unspecified: Secondary | ICD-10-CM | POA: Diagnosis present

## 2020-04-12 DIAGNOSIS — A419 Sepsis, unspecified organism: Secondary | ICD-10-CM | POA: Diagnosis present

## 2020-04-12 DIAGNOSIS — K219 Gastro-esophageal reflux disease without esophagitis: Secondary | ICD-10-CM | POA: Diagnosis present

## 2020-04-12 DIAGNOSIS — N2581 Secondary hyperparathyroidism of renal origin: Secondary | ICD-10-CM | POA: Diagnosis present

## 2020-04-12 DIAGNOSIS — Z7189 Other specified counseling: Secondary | ICD-10-CM

## 2020-04-12 DIAGNOSIS — Z8616 Personal history of COVID-19: Secondary | ICD-10-CM

## 2020-04-12 DIAGNOSIS — E876 Hypokalemia: Secondary | ICD-10-CM | POA: Diagnosis not present

## 2020-04-12 DIAGNOSIS — Z515 Encounter for palliative care: Secondary | ICD-10-CM

## 2020-04-12 DIAGNOSIS — N186 End stage renal disease: Secondary | ICD-10-CM | POA: Diagnosis present

## 2020-04-12 DIAGNOSIS — Z20822 Contact with and (suspected) exposure to covid-19: Secondary | ICD-10-CM | POA: Diagnosis present

## 2020-04-12 DIAGNOSIS — I2510 Atherosclerotic heart disease of native coronary artery without angina pectoris: Secondary | ICD-10-CM | POA: Diagnosis present

## 2020-04-12 DIAGNOSIS — G4733 Obstructive sleep apnea (adult) (pediatric): Secondary | ICD-10-CM | POA: Diagnosis present

## 2020-04-12 DIAGNOSIS — I132 Hypertensive heart and chronic kidney disease with heart failure and with stage 5 chronic kidney disease, or end stage renal disease: Secondary | ICD-10-CM | POA: Diagnosis present

## 2020-04-12 DIAGNOSIS — R6520 Severe sepsis without septic shock: Secondary | ICD-10-CM

## 2020-04-12 DIAGNOSIS — Y92013 Bedroom of single-family (private) house as the place of occurrence of the external cause: Secondary | ICD-10-CM | POA: Diagnosis not present

## 2020-04-12 DIAGNOSIS — I252 Old myocardial infarction: Secondary | ICD-10-CM

## 2020-04-12 DIAGNOSIS — E669 Obesity, unspecified: Secondary | ICD-10-CM | POA: Diagnosis present

## 2020-04-12 DIAGNOSIS — Z66 Do not resuscitate: Secondary | ICD-10-CM | POA: Diagnosis present

## 2020-04-12 DIAGNOSIS — J9621 Acute and chronic respiratory failure with hypoxia: Secondary | ICD-10-CM | POA: Diagnosis present

## 2020-04-12 DIAGNOSIS — Z9981 Dependence on supplemental oxygen: Secondary | ICD-10-CM

## 2020-04-12 DIAGNOSIS — Z87891 Personal history of nicotine dependence: Secondary | ICD-10-CM

## 2020-04-12 DIAGNOSIS — E7800 Pure hypercholesterolemia, unspecified: Secondary | ICD-10-CM | POA: Diagnosis present

## 2020-04-12 DIAGNOSIS — E039 Hypothyroidism, unspecified: Secondary | ICD-10-CM | POA: Diagnosis present

## 2020-04-12 DIAGNOSIS — Z888 Allergy status to other drugs, medicaments and biological substances status: Secondary | ICD-10-CM

## 2020-04-12 DIAGNOSIS — Z79899 Other long term (current) drug therapy: Secondary | ICD-10-CM

## 2020-04-12 DIAGNOSIS — J9611 Chronic respiratory failure with hypoxia: Secondary | ICD-10-CM | POA: Diagnosis present

## 2020-04-12 DIAGNOSIS — Z955 Presence of coronary angioplasty implant and graft: Secondary | ICD-10-CM

## 2020-04-12 DIAGNOSIS — Z833 Family history of diabetes mellitus: Secondary | ICD-10-CM

## 2020-04-12 DIAGNOSIS — Z8522 Personal history of malignant neoplasm of nasal cavities, middle ear, and accessory sinuses: Secondary | ICD-10-CM

## 2020-04-12 DIAGNOSIS — E871 Hypo-osmolality and hyponatremia: Secondary | ICD-10-CM | POA: Diagnosis not present

## 2020-04-12 DIAGNOSIS — J189 Pneumonia, unspecified organism: Secondary | ICD-10-CM | POA: Diagnosis not present

## 2020-04-12 DIAGNOSIS — G629 Polyneuropathy, unspecified: Secondary | ICD-10-CM | POA: Diagnosis present

## 2020-04-12 DIAGNOSIS — Z6831 Body mass index (BMI) 31.0-31.9, adult: Secondary | ICD-10-CM

## 2020-04-12 DIAGNOSIS — Z885 Allergy status to narcotic agent status: Secondary | ICD-10-CM

## 2020-04-12 DIAGNOSIS — K5790 Diverticulosis of intestine, part unspecified, without perforation or abscess without bleeding: Secondary | ICD-10-CM | POA: Diagnosis present

## 2020-04-12 DIAGNOSIS — Z809 Family history of malignant neoplasm, unspecified: Secondary | ICD-10-CM

## 2020-04-12 LAB — RESPIRATORY PANEL BY RT PCR (FLU A&B, COVID)
Influenza A by PCR: NEGATIVE
Influenza B by PCR: NEGATIVE
SARS Coronavirus 2 by RT PCR: NEGATIVE

## 2020-04-12 LAB — TROPONIN I (HIGH SENSITIVITY): Troponin I (High Sensitivity): 24 ng/L — ABNORMAL HIGH (ref <18)

## 2020-04-12 LAB — COMPREHENSIVE METABOLIC PANEL
ALT: 15 U/L (ref 0–44)
AST: 19 U/L (ref 15–41)
Albumin: 2.9 g/dL — ABNORMAL LOW (ref 3.5–5.0)
Alkaline Phosphatase: 137 U/L — ABNORMAL HIGH (ref 38–126)
Anion gap: 19 — ABNORMAL HIGH (ref 5–15)
BUN: 59 mg/dL — ABNORMAL HIGH (ref 8–23)
CO2: 26 mmol/L (ref 22–32)
Calcium: 9 mg/dL (ref 8.9–10.3)
Chloride: 89 mmol/L — ABNORMAL LOW (ref 98–111)
Creatinine, Ser: 9.72 mg/dL — ABNORMAL HIGH (ref 0.61–1.24)
GFR, Estimated: 5 mL/min — ABNORMAL LOW (ref >60)
Glucose, Bld: 154 mg/dL — ABNORMAL HIGH (ref 70–99)
Potassium: 4.3 mmol/L (ref 3.5–5.1)
Sodium: 134 mmol/L — ABNORMAL LOW (ref 135–145)
Total Bilirubin: 0.7 mg/dL (ref 0.3–1.2)
Total Protein: 7.2 g/dL (ref 6.5–8.1)

## 2020-04-12 LAB — CBC WITH DIFFERENTIAL/PLATELET
Abs Immature Granulocytes: 0.1 10*3/uL — ABNORMAL HIGH (ref 0.00–0.07)
Basophils Absolute: 0.1 10*3/uL (ref 0.0–0.1)
Basophils Relative: 0 %
Eosinophils Absolute: 0.1 10*3/uL (ref 0.0–0.5)
Eosinophils Relative: 1 %
HCT: 33.7 % — ABNORMAL LOW (ref 39.0–52.0)
Hemoglobin: 11 g/dL — ABNORMAL LOW (ref 13.0–17.0)
Immature Granulocytes: 1 %
Lymphocytes Relative: 4 %
Lymphs Abs: 0.6 10*3/uL — ABNORMAL LOW (ref 0.7–4.0)
MCH: 27.3 pg (ref 26.0–34.0)
MCHC: 32.6 g/dL (ref 30.0–36.0)
MCV: 83.6 fL (ref 80.0–100.0)
Monocytes Absolute: 0.3 10*3/uL (ref 0.1–1.0)
Monocytes Relative: 2 %
Neutro Abs: 13 10*3/uL — ABNORMAL HIGH (ref 1.7–7.7)
Neutrophils Relative %: 92 %
Platelets: 297 10*3/uL (ref 150–400)
RBC: 4.03 MIL/uL — ABNORMAL LOW (ref 4.22–5.81)
RDW: 18.2 % — ABNORMAL HIGH (ref 11.5–15.5)
WBC: 14.2 10*3/uL — ABNORMAL HIGH (ref 4.0–10.5)
nRBC: 0 % (ref 0.0–0.2)

## 2020-04-12 LAB — LACTIC ACID, PLASMA: Lactic Acid, Venous: 2.4 mmol/L (ref 0.5–1.9)

## 2020-04-12 MED ORDER — METRONIDAZOLE IN NACL 5-0.79 MG/ML-% IV SOLN
500.0000 mg | Freq: Once | INTRAVENOUS | Status: AC
  Administered 2020-04-13: 500 mg via INTRAVENOUS
  Filled 2020-04-12: qty 100

## 2020-04-12 MED ORDER — VANCOMYCIN HCL 2000 MG/400ML IV SOLN
2000.0000 mg | Freq: Once | INTRAVENOUS | Status: AC
  Administered 2020-04-13: 2000 mg via INTRAVENOUS
  Filled 2020-04-12: qty 400

## 2020-04-12 MED ORDER — LACTATED RINGERS IV BOLUS
1000.0000 mL | Freq: Once | INTRAVENOUS | Status: AC
  Administered 2020-04-13: 1000 mL via INTRAVENOUS

## 2020-04-12 MED ORDER — ACETAMINOPHEN 650 MG RE SUPP
975.0000 mg | Freq: Once | RECTAL | Status: AC
  Administered 2020-04-12: 975 mg via RECTAL
  Filled 2020-04-12: qty 1

## 2020-04-12 MED ORDER — VANCOMYCIN HCL IN DEXTROSE 1-5 GM/200ML-% IV SOLN: 1000.0000 mg | Freq: Once | INTRAVENOUS | Status: DC

## 2020-04-12 MED ORDER — SODIUM CHLORIDE 0.9 % IV SOLN
2.0000 g | Freq: Once | INTRAVENOUS | Status: AC
  Administered 2020-04-12: 2 g via INTRAVENOUS
  Filled 2020-04-12: qty 2

## 2020-04-12 NOTE — ED Notes (Signed)
Lab called for second set of blood cultures.

## 2020-04-12 NOTE — ED Notes (Signed)
Date and time results received: 04/12/20 2200 (use smartphrase ".now" to insert current time)  Test: Lactic Acid Critical Value: 2.4  Name of Provider Notified: Dr. Charna Archer  Orders Received? Or Actions Taken?: Provider notified

## 2020-04-12 NOTE — Progress Notes (Signed)
CODE SEPSIS - PHARMACY COMMUNICATION  **Broad Spectrum Antibiotics should be administered within 1 hour of Sepsis diagnosis**  Time Code Sepsis Called/Page Received: 2149  Antibiotics Ordered: Vancomycin, Flagyl, Cefepime  Time of 1st antibiotic administration: 2210  Additional action taken by pharmacy: n/a  If necessary, Name of Provider/Nurse Contacted: [place], [place] ,PharmD Clinical Pharmacist  04/12/2020  10:28 PM

## 2020-04-12 NOTE — Consult Note (Signed)
PHARMACY -  BRIEF ANTIBIOTIC NOTE   Pharmacy has received consult(s) for Vancomycin and Cefpeime from an ED provider.  The patient's profile has been reviewed for ht/wt/allergies/indication/available labs. No body weight recorded in the chart. I called the ED nurse and was informed the pt wt is 226.5 lbs (last documented weight on 01/12/2020 was 198 lbs).  One time order(s) placed for Cefepime 2g x1 dose and Vancomycin 2g x1 dose  Further antibiotics/pharmacy consults should be ordered by admitting physician if indicated.                       Thank you, Rowland Lathe 04/12/2020  9:56 PM

## 2020-04-12 NOTE — ED Triage Notes (Signed)
Pt brought in from home by [facility]. Pt's family reported pt was more altered than baseline after falling out of bed. REMS reported temp of 102F. Upon arrival, temp is 99.16F.

## 2020-04-12 NOTE — Sepsis Progress Note (Signed)
Following for Code Sepsis. 3 Hour bundle complete. Will follow for remainder of 6 hours and repeat of lactic acid.

## 2020-04-12 NOTE — ED Provider Notes (Signed)
[facility] Emergency Department Provider Note   ____________________________________________   First MD Initiated Contact with Patient 04/12/20 2119     (approximate)  I have reviewed the triage vital signs and the nursing notes.   HISTORY  Chief Complaint Altered Mental Status    HPI DAISHAWN LAUF is a 79 y.o. male with past medical history of hypertension, diabetes, CAD, ESRD on PD, COPD on 5 L via face tent, and nasal cancer who presents to the ED for altered mental status.  History is limited due to patient's confusion, per EMS family at home noted he had slipped out of bed and he was found on the ground, acting confused.  EMS reported temp of 102 prior to arrival.  Patient is reportedly alert and oriented x4 at baseline.  Patient currently answering questions appropriately, complains of pain in his upper back and abdomen, denies cough, chest pain, or shortness of breath.  He does not make much urine and denies any urinary symptoms.  Speaking with his family over the phone, there was an issue with his peritoneal dialysis yesterday where the connection from his dialysate to catheter became undone and there was significant leakage.  Family was concerned this could have resulted in introduction of infection and they were told by dialysis nurse to watch out for symptoms of peritonitis.        Past Medical History:  Diagnosis Date  . Anemia   . Arthritis    hands, back  . Cancer (HCC)    nose  . Cardiac arrest ([place])   . Cataract   . CHF (congestive heart failure) ([place])   . COPD (chronic obstructive pulmonary disease) ([place])   . Coronary artery disease   . Diabetes mellitus ([place])    Type II  . Diverticulosis 2013   tcs by RMR  . Diverticulosis   . Erosive esophagitis   . ESRD on hemodialysis (HCC)    CKD, hemodialysis Tues/Thurs/Sat  . Family history of adverse reaction to anesthesia    Daughter slow to awaken and extreme nausea  . GERD  (gastroesophageal reflux disease)   . Headache   . Hiatal hernia    pt not aware of this  . HTN (hypertension)   . Hypercholesterolemia   . Hypothyroidism   . Irritable bowel syndrome (IBS)   . Jerking    hands or feet- 01/2019- has decreased  . Lymphocytic colitis 2013   tcs by RMR  . Myocardial infarction ([place])    X2  . Nasal mass   . Neuropathy   . Pneumonia   . Schatzki's ring   . SDH (subdural hematoma) ([place])    02/2018, conservative management  . Shortness of breath   . Sleep apnea    uses O2 only  . Supplemental oxygen dependent    At HS and PRN during the day  . Tobacco abuse   . Wears dentures   . Wears glasses     Patient Active Problem List   Diagnosis Date Noted  . Spontaneous bacterial peritonitis ([place]) 04/12/2020  . Pneumonia due to COVID-19 virus 06/09/2019  . Sepsis ([place]) 06/08/2019  . Thrombocytopenia ([place]) 06/08/2019  . Chronic respiratory failure with hypoxia ([place]) 06/08/2019  . COPD (chronic obstructive pulmonary disease) ([place])   . HTN (hypertension)   . COVID-19 virus infection   . Volume overload 01/26/2019  . Pneumonia 01/25/2019  . Generalized weakness 01/24/2019  . Congestive heart failure ([place]) 05/11/2016  . Venous stenosis of left  upper extremity 05/11/2016  . End stage renal disease ([place]) 06/19/2013  . Rectal bleeding 11/27/2012  . Upper abdominal pain 07/07/2012  . Anemia 07/07/2012  . Chronic nausea 07/07/2012  . Microscopic colitis 03/07/2012  . Diarrhea of presumed infectious origin 02/01/2010  . DIVERTICULITIS, HX OF 02/01/2010  . CONSTIPATION 09/10/2008  . ABDOMINAL PAIN, LOWER 09/10/2008  . Insulin-requiring or dependent type II diabetes mellitus ([place]) 09/09/2008  . CIGARETTE SMOKER 09/09/2008  . Coronary atherosclerosis 09/09/2008  . 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 Esophageal reflux 09/09/2008  . HIATAL HERNIA 09/09/2008  . HOARSENESS 09/09/2008    Past Surgical History:  Procedure Laterality Date  . A/V FISTULAGRAM N/A  04/14/2019   Procedure: A/V FISTULAGRAM - Left Arm;  Surgeon: [name], MD;  Location: [facility];  Service: Cardiovascular;  Laterality: N/A;  . AV FISTULA PLACEMENT Left 06/29/2013   Procedure: LEFT RADIAL/CEPHALIC FISTULA;  Surgeon: Conrad [place], MD;  Location: [facility];  Service: Vascular;  Laterality: Left;  . BASCILIC VEIN TRANSPOSITION Left 01/21/2019   Procedure: left arm Bascilic Vein Transposition;  Surgeon: Waynetta Sandy, MD;  Location: [facility];  Service: Vascular;  Laterality: Left;  . cardiac stents     5  . CATARACT EXTRACTION W/PHACO Left 05/17/2014   Procedure: CATARACT EXTRACTION PHACO AND INTRAOCULAR LENS PLACEMENT (IOC);  Surgeon: Tonny Branch, MD;  Location: [facility];  Service: Ophthalmology;  Laterality: Left;  CDE:7.71  . CATARACT EXTRACTION W/PHACO Right 06/14/2014   Procedure: CATARACT EXTRACTION PHACO AND INTRAOCULAR LENS PLACEMENT RIGHT EYE;  Surgeon: Tonny Branch, MD;  Location: [facility];  Service: Ophthalmology;  Laterality: Right;  CDE:8.22  . CHOLECYSTECTOMY    . COLONOSCOPY  December 2007   adenomatous polyps, sigmoid diverticula  . COLONOSCOPY  02/06/2012   Dr. Gala Romney- colonic diverticulosis, bx consistent with lymphocytic colitis  . CORONARY ANGIOPLASTY     x5 stents  . ESOPHAGOGASTRODUODENOSCOPY  May 2009   RMR: non-critical Schatzki's ring, sp dilation with 97 F [place], small inlet patch, mild erosive relufx esophagitis  . ESOPHAGOGASTRODUODENOSCOPY N/A 07/15/2012   Dr. Gala Romney- hiatal hernia, duodenal ulcer with surrounding erosions. this may well be related to the "haziness" sen around the head of the pancreas and duodenum on recent noncontrast ct scan and may have much to do with his abdominal pain.  Marland Kitchen EXCHANGE OF A DIALYSIS CATHETER Right 01/21/2019   Procedure: EXCHANGE OF A DIALYSIS CATHETER, right internal jugular;  Surgeon: Waynetta Sandy, MD;  Location: [facility];  Service: Vascular;  Laterality: Right;  . EXCISION NASAL MASS N/A  07/18/2015   Procedure: EXCISION OF NASAL MASS;  Surgeon: Leta Baptist, MD;  Location: [facility];  Service: ENT;  Laterality: N/A;  . INSERTION OF DIALYSIS CATHETER Right 05/21/2013   Procedure: INSERTION OF DIALYSIS CATHETER;  Surgeon: Conrad Amasa, MD;  Location: [facility];  Service: Vascular;  Laterality: Right;  . IR DIALY SHUNT INTRO [place] W/IMG LEFT Left 10/29/2018  . IR FLUORO GUIDE CV LINE RIGHT  11/05/2018  . IR FLUORO GUIDE CV LINE RIGHT  02/04/2019  . IR REMOVAL TUN CV CATH W/O FL  04/23/2019  . IR US GUIDE VASC ACCESS LEFT  10/29/2018  . IR US GUIDE VASC ACCESS RIGHT  11/05/2018  . LIGATION OF COMPETING BRANCHES OF ARTERIOVENOUS FISTULA Left 11/27/2018   Procedure: Ligation Of Competing Branches Of Arteriovenous Fistula;  Surgeon: [name], MD;  Location[facility];  Service: Vascular;  Laterality: Left;  Marland Kitchen MULTIPLE TOOTH  EXTRACTIONS    . REVISION OF ARTERIOVENOUS GORETEX GRAFT Left 11/27/2018   Procedure: Revision of Left Arm Arteriovenious Fistula;  Surgeon: [name], MD;  Location: [facility] OR;  Service: Vascular;  Laterality: Left;  . THROMBECTOMY W/ EMBOLECTOMY Left 11/27/2018   Procedure: Thrombectomy Arteriovenous Fistula;  Surgeon: [name], MD;  Location: [facility] OR;  Service: Vascular;  Laterality: Left;  Marland Kitchen VASECTOMY      Prior to Admission medications   Medication Sig Start Date End Date Taking? Authorizing Provider  acetaminophen (TYLENOL) 500 MG tablet Take 1,000 mg by mouth every 6 (six) hours as needed for mild pain or headache.    [provider]  albuterol (PROVENTIL) (2.5 MG/3ML) 0.083% nebulizer solution Take 2.5 mg by nebulization every 6 (six) hours as needed for wheezing or shortness of breath.    [provider]  albuterol (VENTOLIN HFA) 108 (90 Base) MCG/ACT inhaler Inhale 1-2 puffs into the lungs every 4 (four) hours as needed for wheezing or shortness of breath.    [provider]  ALPRAZolam Duanne Moron) 1 MG tablet Take 1 mg by  mouth at bedtime as needed for sleep.  07/30/18   [provider]  b complex-vitamin c-folic acid (NEPHRO-VITE) 0.8 MG TABS tablet Take 1 tablet by mouth at bedtime.     [provider]  calcium acetate (PHOSLO) 667 MG capsule Take 667-1,334 mg by mouth See admin instructions. Take 2 capsules (1334 mg) by mouth with each meal & take 1 capsule (667 mg) by mouth with each snack.    [provider]  Calcium Carb-Cholecalciferol (CALCIUM + D3 PO) Take 1 tablet by mouth daily.     [provider]  Cholecalciferol (VITAMIN D3) 50 MCG (2000 UT) capsule Take 2,000 Units by mouth 2 (two) times daily.     [provider]  dicyclomine (BENTYL) 20 MG tablet Take 20 mg by mouth See admin instructions. Take 1 tablet (20 mg) by mouth scheduled at night, may take up to 3 additional doses as needed for abdominal issues .    [provider]  diphenhydrAMINE (BENADRYL) 25 MG tablet Take 25 mg by mouth every Monday, Wednesday, and Friday.    [provider]  diphenhydrAMINE-zinc acetate (BENADRYL) cream Apply topically 2 (two) times daily as needed for itching. 01/30/19   Jeanmarie Hubert, MD  hydrocortisone cream 1 % Apply 1 application topically 2 (two) times daily as needed for itching.    [provider]  insulin lispro (HUMALOG) 100 UNIT/ML injection Inject 0.03 mLs (3 Units total) into the skin 3 (three) times daily with meals. 06/17/19   Hongalgi, Lenis Dickinson, MD  isosorbide mononitrate (IMDUR) 30 MG 24 hr tablet Take 30 mg by mouth daily.  03/03/12   [provider]  LANTUS 100 UNIT/ML injection Inject 0.08 mLs (8 Units total) into the skin daily. 06/18/19   Hongalgi, Lenis Dickinson, MD  lidocaine-prilocaine (EMLA) cream Apply 1 application topically as needed (port access).    [provider]  loperamide (IMODIUM) 1 MG/5ML solution Take 1-2 mg by mouth as needed for diarrhea or loose stools.    [provider]  magnesium oxide  (MAG-OX) 400 MG tablet Take 400 mg by mouth at bedtime.     [provider]  Naphazoline-Glycerin (REDNESS RELIEF OP) Place 1 drop into both eyes daily as needed (redness/ dryness).    [provider]  NITROSTAT 0.4 MG SL tablet Place 0.4 mg under the tongue every 5 (five)  minutes as needed for chest pain.  06/01/13   [provider]  Omega-3 Fatty Acids (FISH OIL) 1000 MG CAPS Take 2,000 mg by mouth 2 (two) times a day.    [provider]  omeprazole (PRILOSEC) 20 MG capsule Take 20 mg by mouth 2 (two) times daily.     [provider]  ondansetron (ZOFRAN) 4 MG tablet Take 4 mg by mouth every 6 (six) hours as needed for nausea or vomiting.     [provider]  STIOLTO RESPIMAT 2.5-2.5 MCG/ACT AERS Inhale 2 puffs into the lungs at bedtime. 09/24/18   [provider]  vitamin B-12 (CYANOCOBALAMIN) 1000 MCG tablet Take 1,000 mcg by mouth every evening.    [provider]    Allergies Ace inhibitors, Statins, Codeine, and Tape  Family History  Problem Relation Age of Onset  . Cancer Mother   . Cancer Father   . Diabetes Brother   . Cancer Brother   . Diabetes Sister   . Cancer Sister   . Cancer Sister   . Colon cancer Neg Hx     Social History Social History   Tobacco Use  . Smoking status: Former Smoker    Packs/day: 0.00    Years: 60.00    Pack years: 0.00    Types: E-cigarettes, Cigarettes    Quit date: 11/10/2014    Years since quitting: 5.4  . Smokeless tobacco: Never Used  Vaping Use  . Vaping Use: Never used  Substance Use Topics  . Alcohol use: No  . Drug use: No    Review of Systems  Constitutional: Positive for fever/chills Eyes: No visual changes. ENT: No sore throat. Cardiovascular: Denies chest pain. Respiratory: Denies shortness of breath. Gastrointestinal: Positive for abdominal pain.  No nausea, no vomiting.  No diarrhea.  No constipation. Genitourinary: Negative for  dysuria. Musculoskeletal: Positive for for back pain. Skin: Negative for rash. Neurological: Negative for headaches, focal weakness or numbness.  ____________________________________________   PHYSICAL EXAM:  VITAL SIGNS: ED Triage Vitals [04/12/20 2124]  Enc Vitals Group     BP (!) 166/81     Pulse Rate (!) 110     Resp      Temp 99.8 F (37.7 C)     Temp Source Oral     SpO2 90 %     Weight      Height      Head Circumference      Peak Flow      Pain Score      Pain Loc      Pain Edu?      Excl. in [place]?     Constitutional: Alert and oriented to person and place but not time. Eyes: Conjunctivae are normal. Head: Atraumatic. Nose: No congestion/rhinnorhea. Mouth/Throat: Mucous membranes are dry. Neck: Normal ROM Cardiovascular: Tachycardic, regular rhythm. Grossly normal heart sounds. Respiratory: Normal respiratory effort.  No retractions. Lungs CTAB. Gastrointestinal: Soft and diffusely tender to palpation with no rebound or guarding.  Mildly distended throughout.  PD catheter in place with no surrounding erythema or warmth. Genitourinary: deferred Musculoskeletal: No lower extremity tenderness nor edema. Neurologic:  Normal speech and language. No gross focal neurologic deficits are appreciated. Skin:  Skin is warm, dry and intact. No rash noted. Psychiatric: Mood and affect are normal. Speech and behavior are normal.  ____________________________________________   LABS (all labs ordered are listed, but only abnormal results are displayed)  Labs Reviewed  CBC WITH DIFFERENTIAL/PLATELET - Abnormal; Notable for  the following components:      Result Value   WBC 14.2 (*)    RBC 4.03 (*)    Hemoglobin 11.0 (*)    HCT 33.7 (*)    RDW 18.2 (*)    Neutro Abs 13.0 (*)    Lymphs Abs 0.6 (*)    Abs Immature Granulocytes 0.10 (*)    All other components within normal limits  LACTIC ACID, PLASMA - Abnormal; Notable for the following components:   Lactic Acid,  Venous 2.4 (*)    All other components within normal limits  COMPREHENSIVE METABOLIC PANEL - Abnormal; Notable for the following components:   Sodium 134 (*)    Chloride 89 (*)    Glucose, Bld 154 (*)    BUN 59 (*)    Creatinine, Ser 9.72 (*)    Albumin 2.9 (*)    Alkaline Phosphatase 137 (*)    GFR, Estimated 5 (*)    Anion gap 19 (*)    All other components within normal limits  TROPONIN I (HIGH SENSITIVITY) - Abnormal; Notable for the following components:   Troponin I (High Sensitivity) 24 (*)    All other components within normal limits  RESPIRATORY PANEL BY RT PCR (FLU A&B, COVID)  CULTURE, BLOOD (ROUTINE X 2)  CULTURE, BLOOD (ROUTINE X 2)  BODY FLUID CULTURE  LACTIC ACID, PLASMA  URINALYSIS, COMPLETE (UACMP) WITH MICROSCOPIC  BODY FLUID CELL COUNT WITH DIFFERENTIAL  PROCALCITONIN  PROCALCITONIN  TROPONIN I (HIGH SENSITIVITY)   ____________________________________________  EKG  ED ECG REPORT I, Blake Divine, the attending physician, personally viewed and interpreted this ECG.   Date: 04/12/2020  EKG Time: 21:23  Rate: 131  Rhythm: sinus tachycardia  Axis: Normal  Intervals:none  ST&T Change: Nonspecific ST/T changes, interpretation limited by patient's tremors   PROCEDURES  Procedure(s) performed (including Critical Care):  .Critical Care Performed by: Blake Divine, MD Authorized by: Blake Divine, MD   Critical care provider statement:    Critical care time (minutes):  45   Critical care time was exclusive of:  Separately billable procedures and treating other patients and teaching time   Critical care was necessary to treat or prevent imminent or life-threatening deterioration of the following conditions:  Sepsis   Critical care was time spent personally by me on the following activities:  Discussions with consultants, evaluation of patient's response to treatment, examination of patient, ordering and performing treatments and interventions,  ordering and review of laboratory studies, ordering and review of radiographic studies, pulse oximetry, re-evaluation of patient's condition, obtaining history from patient or surrogate and review of old charts   I assumed direction of critical care for this patient from another provider in my specialty: no       ____________________________________________   INITIAL IMPRESSION / ASSESSMENT AND PLAN / ED COURSE       79 year old male with past medical history of hypertension, diabetes, CAD, ESRD on PD, COPD on 5 L, and nasal cancer status post total rhinectomy who presents to the ED for generalized weakness and diffuse abdominal pain after found by family to have fallen out of bed.  Patient noted to have elevated rectal temperature and tachycardia on arrival, concerning for sepsis.  We will obtain peritoneal fluid sample and send for cell counts as well as Gram stain and culture as this seems to be the most likely source of his sepsis.  Chest x-ray reviewed by me and is also concerning for a multifocal pneumonia.  We will cover patient  with broad-spectrum antibiotics and give IV fluid bolus.  CT head and C-spine were negative for acute process, CT abdomen/pelvis does show intraperitoneal free air, although this is likely related to the recent issue with his PD catheter.  Case discussed with Dr. Peyton Najjar of general surgery, who has reviewed the images and agrees that there are no signs of bowel perforation.  Case discussed with hospitalist for admission.      ____________________________________________   FINAL CLINICAL IMPRESSION(S) / ED DIAGNOSES  Final diagnoses:  Sepsis without acute organ dysfunction, due to unspecified organism ([place])  Peritonitis associated with peritoneal dialysis, initial encounter ([place])  Altered mental status, unspecified altered mental status type     ED Discharge Orders    None       Note:  This document was prepared using Dragon voice recognition  software and may include unintentional dictation errors.   Blake Divine, MD 04/12/20 2358

## 2020-04-12 NOTE — ED Notes (Signed)
Phlebotomy at bedside now, unable to get 2nd set of blood cultures.

## 2020-04-12 NOTE — Progress Notes (Signed)
CODE SEPSIS - PHARMACY COMMUNICATION  **Broad Spectrum Antibiotics should be administered within 1 hour of Sepsis diagnosis**  Time Code Sepsis Called/Page Received: @ 2149  Antibiotics Ordered: Vancomycin and Cefepime   Time of 1st antibiotic administration: 2210     Rowland Lathe ,PharmD Clinical Pharmacist  04/12/2020  9:56 PM

## 2020-04-12 NOTE — ED Notes (Signed)
Provider notified that IV line infiltrated. Provider at bedside to try and see if he can get an IV

## 2020-04-12 NOTE — H&P (Signed)
History and Physical   Thomas [name] DDU:[id number] DOB: [birth year] DOA: 04/12/2020  Referring MD/NP/PA: Dr. Charna Archer  PCP: Medicine, [facility]   Outpatient Specialists: Nephrology  Patient coming from: Home  Chief Complaint: Abdominal pain  HPI: Thomas [name] is a 79 y.o. male with medical history significant of end-stage renal disease on peritoneal dialysis, COPD, diverticular disease, hypertension, COVID-19 infection in January of this year, hyperlipidemia, hypothyroidism, nasal mass status post removal, history of subdural hematoma and obstructive sleep apnea oxygen dependent with facemask who was brought in with abdominal pain fever and chills.  Patient apparently had some issues with his peritoneal dialysis fluid and nonsterile procedure and exchange of fluid was performed few days ago.  Started having significant abdominal pain altered mental status and fever.  Brought to the ER where he was seen and evaluated.  Patient has diffuse abdominal tenderness with rebound consistent with peritonitis.  Aspiration of his peritoneal fluid showed cloudy fluid which has been sent to the lab.  Awaiting initial reports.  He has been initiated on antibiotics with suspected spontaneous bacterial peritonitis.  Patient also meets sepsis criteria by this definition.  We will admit him to our service for continued treatment.  No nausea vomiting or diarrhea.  He is slightly drowsy at the moment..  ED Course: Temperature 100.9 blood pressure 166/81 pulse 110 respiratory rate of 22 oxygen sat 90%.  Sodium is 134 potassium 4.3 chloride of 89 CO2 26 glucose 154 BUN 59 creatinine 9.72 and a gap of 19.  Troponin is 24 lactic acid 2.4 white count 14.2 hemoglobin 11.1 platelets of 297.  COVID-19 screen is negative.  Blood cultures and cultures of body fluid has been collected and patient initiated on the sepsis protocol.  Review of Systems: As per HPI otherwise 10 point review of systems negative.    Past Medical  History:  Diagnosis Date  . Anemia   . Arthritis    hands, back  . Cancer (HCC)    nose  . Cardiac arrest ([place])   . Cataract   . CHF (congestive heart failure) ([place])   . COPD (chronic obstructive pulmonary disease) ([place])   . Coronary artery disease   . Diabetes mellitus ([place])    Type II  . Diverticulosis 2013   tcs by RMR  . Diverticulosis   . Erosive esophagitis   . ESRD on hemodialysis (HCC)    CKD, hemodialysis Tues/Thurs/Sat  . Family history of adverse reaction to anesthesia    Daughter slow to awaken and extreme nausea  . GERD (gastroesophageal reflux disease)   . Headache   . Hiatal hernia    pt not aware of this  . HTN (hypertension)   . Hypercholesterolemia   . Hypothyroidism   . Irritable bowel syndrome (IBS)   . Jerking    hands or feet- 01/2019- has decreased  . Lymphocytic colitis 2013   tcs by RMR  . Myocardial infarction ([place])    X2  . Nasal mass   . Neuropathy   . Pneumonia   . Schatzki's ring   . SDH (subdural hematoma) ([place])    02/2018, conservative management  . Shortness of breath   . Sleep apnea    uses O2 only  . Supplemental oxygen dependent    At HS and PRN during the day  . Tobacco abuse   . Wears dentures   . Wears glasses     Past Surgical History:  Procedure Laterality Date  . A/V FISTULAGRAM N/A 04/14/2019  Procedure: A/V FISTULAGRAM - Left Arm;  Surgeon: [name], MD;  Location: [place] [facility];  Service: Cardiovascular;  Laterality: N/A;  . AV FISTULA PLACEMENT Left 06/29/2013   Procedure: LEFT RADIAL/CEPHALIC FISTULA;  Surgeon: Conrad Curlew, MD;  Location: [place];  Service: Vascular;  Laterality: Left;  . BASCILIC VEIN TRANSPOSITION Left 01/21/2019   Procedure: left arm Bascilic Vein Transposition;  Surgeon: Waynetta Sandy, MD;  Location: [place];  Service: Vascular;  Laterality: Left;  . cardiac stents     5  . CATARACT EXTRACTION W/PHACO Left 05/17/2014   Procedure: CATARACT EXTRACTION PHACO AND  INTRAOCULAR LENS PLACEMENT (IOC);  Surgeon: Tonny Branch, MD;  Location: [facility];  Service: Ophthalmology;  Laterality: Left;  CDE:7.71  . CATARACT EXTRACTION W/PHACO Right 06/14/2014   Procedure: CATARACT EXTRACTION PHACO AND INTRAOCULAR LENS PLACEMENT RIGHT EYE;  Surgeon: Tonny Branch, MD;  Location: [facility];  Service: Ophthalmology;  Laterality: Right;  CDE:8.22  . CHOLECYSTECTOMY    . COLONOSCOPY  December 2007   adenomatous polyps, sigmoid diverticula  . COLONOSCOPY  02/06/2012   Dr. Gala Romney- colonic diverticulosis, bx consistent with lymphocytic colitis  . CORONARY ANGIOPLASTY     x5 stents  . ESOPHAGOGASTRODUODENOSCOPY  May 2009   RMR: non-critical Schatzki's ring, sp dilation with 55 F Maloney, small inlet patch, mild erosive relufx esophagitis  . ESOPHAGOGASTRODUODENOSCOPY N/A 07/15/2012   Dr. Gala Romney- hiatal hernia, duodenal ulcer with surrounding erosions. this may well be related to the "haziness" sen around the head of the pancreas and duodenum on recent noncontrast ct scan and may have much to do with his abdominal pain.  Marland Kitchen EXCHANGE OF A DIALYSIS CATHETER Right 01/21/2019   Procedure: EXCHANGE OF A DIALYSIS CATHETER, right internal jugular;  Surgeon: Waynetta Sandy, MD;  Location: [place];  Service: Vascular;  Laterality: Right;  . EXCISION NASAL MASS N/A 07/18/2015   Procedure: EXCISION OF NASAL MASS;  Surgeon: Leta Baptist, MD;  Location: [place];  Service: ENT;  Laterality: N/A;  . INSERTION OF DIALYSIS CATHETER Right 05/21/2013   Procedure: INSERTION OF DIALYSIS CATHETER;  Surgeon: Conrad Poynor, MD;  Location: [place];  Service: Vascular;  Laterality: Right;  . IR DIALY SHUNT INTRO [place] W/IMG LEFT Left 10/29/2018  . IR FLUORO GUIDE CV LINE RIGHT  11/05/2018  . IR FLUORO GUIDE CV LINE RIGHT  02/04/2019  . IR REMOVAL TUN CV CATH W/O FL  04/23/2019  . IR US GUIDE VASC ACCESS LEFT  10/29/2018  . IR US GUIDE VASC ACCESS RIGHT  11/05/2018  . LIGATION OF COMPETING BRANCHES OF  ARTERIOVENOUS FISTULA Left 11/27/2018   Procedure: Ligation Of Competing Branches Of Arteriovenous Fistula;  Surgeon: [name], MD;  Location: [facility];  Service: Vascular;  Laterality: Left;  Marland Kitchen MULTIPLE TOOTH EXTRACTIONS    . REVISION OF ARTERIOVENOUS GORETEX GRAFT Left 11/27/2018   Procedure: Revision of Left Arm Arteriovenious Fistula;  Surgeon: [name], MD;  Location: [facility];  Service: Vascular;  Laterality: Left;  . THROMBECTOMY W/ EMBOLECTOMY Left 11/27/2018   Procedure: Thrombectomy Arteriovenous Fistula;  Surgeon: [name], MD;  Location: [facility];  Service: Vascular;  Laterality: Left;  Marland Kitchen VASECTOMY       reports that he quit smoking about 5 years ago. His smoking use included e-cigarettes and cigarettes. He smoked 0.00 packs per day for 60.00 years. He has never used smokeless tobacco. He reports that he does not drink alcohol and does not use  drugs.  Allergies  Allergen Reactions  . Ace Inhibitors Anaphylaxis    Swelling in throat and neck  . Statins Anaphylaxis and Swelling    Caused feet, tongue and throat to swell.  . Codeine Other (See Comments)    Muscle Spasms  . Tape Hives    Family History  Problem Relation Age of Onset  . Cancer Mother   . Cancer Father   . Diabetes Brother   . Cancer Brother   . Diabetes Sister   . Cancer Sister   . Cancer Sister   . Colon cancer Neg Hx      Prior to Admission medications   Medication Sig Start Date End Date Taking? Authorizing Provider  acetaminophen (TYLENOL) 500 MG tablet Take 1,000 mg by mouth every 6 (six) hours as needed for mild pain or headache.    [provider]  albuterol (PROVENTIL) (2.5 MG/3ML) 0.083% nebulizer solution Take 2.5 mg by nebulization every 6 (six) hours as needed for wheezing or shortness of breath.    [provider]  albuterol (VENTOLIN HFA) 108 (90 Base) MCG/ACT inhaler Inhale 1-2 puffs into the lungs every 4 (four) hours as needed for wheezing or shortness of  breath.    [provider]  ALPRAZolam Duanne Moron) 1 MG tablet Take 1 mg by mouth at bedtime as needed for sleep.  07/30/18   [provider]  b complex-vitamin c-folic acid (NEPHRO-VITE) 0.8 MG TABS tablet Take 1 tablet by mouth at bedtime.     [provider]  calcium acetate (PHOSLO) 667 MG capsule Take 667-1,334 mg by mouth See admin instructions. Take 2 capsules (1334 mg) by mouth with each meal & take 1 capsule (667 mg) by mouth with each snack.    [provider]  Calcium Carb-Cholecalciferol (CALCIUM + D3 PO) Take 1 tablet by mouth daily.     [provider]  Cholecalciferol (VITAMIN D3) 50 MCG (2000 UT) capsule Take 2,000 Units by mouth 2 (two) times daily.     [provider]  dicyclomine (BENTYL) 20 MG tablet Take 20 mg by mouth See admin instructions. Take 1 tablet (20 mg) by mouth scheduled at night, may take up to 3 additional doses as needed for abdominal issues .    [provider]  diphenhydrAMINE (BENADRYL) 25 MG tablet Take 25 mg by mouth every Monday, Wednesday, and Friday.    [provider]  diphenhydrAMINE-zinc acetate (BENADRYL) cream Apply topically 2 (two) times daily as needed for itching. 01/30/19   Jeanmarie Hubert, MD  hydrocortisone cream 1 % Apply 1 application topically 2 (two) times daily as needed for itching.    [provider]  insulin lispro (HUMALOG) 100 UNIT/ML injection Inject 0.03 mLs (3 Units total) into the skin 3 (three) times daily with meals. 06/17/19   Hongalgi, Lenis Dickinson, MD  isosorbide mononitrate (IMDUR) 30 MG 24 hr tablet Take 30 mg by mouth daily.  03/03/12   [provider]  LANTUS 100 UNIT/ML injection Inject 0.08 mLs (8 Units total) into the skin daily. 06/18/19   Hongalgi, Lenis Dickinson, MD  lidocaine-prilocaine (EMLA) cream Apply 1 application topically as needed (port access).    [provider]  loperamide (IMODIUM) 1 MG/5ML solution Take 1-2 mg by mouth as  needed for diarrhea or loose stools.    [provider]  magnesium oxide (MAG-OX) 400 MG tablet Take 400 mg by mouth at bedtime.     [provider]  Naphazoline-Glycerin (REDNESS RELIEF  OP) Place 1 drop into both eyes daily as needed (redness/ dryness).    [provider]  NITROSTAT 0.4 MG SL tablet Place 0.4 mg under the tongue every 5 (five) minutes as needed for chest pain.  06/01/13   [provider]  Omega-3 Fatty Acids (FISH OIL) 1000 MG CAPS Take 2,000 mg by mouth 2 (two) times a day.    [provider]  omeprazole (PRILOSEC) 20 MG capsule Take 20 mg by mouth 2 (two) times daily.     [provider]  ondansetron (ZOFRAN) 4 MG tablet Take 4 mg by mouth every 6 (six) hours as needed for nausea or vomiting.     [provider]  STIOLTO RESPIMAT 2.5-2.5 MCG/ACT AERS Inhale 2 puffs into the lungs at bedtime. 09/24/18   [provider]  vitamin B-12 (CYANOCOBALAMIN) 1000 MCG tablet Take 1,000 mcg by mouth every evening.    [provider]    Physical Exam: Vitals:   04/12/20 2124 04/12/20 2206 04/12/20 2303  BP: (!) 166/81  (!) 154/68  Pulse: (!) 110  (!) 109  Resp:   (!) 22  Temp: 99.8 F (37.7 C) (!) 100.9 F (38.3 C)   TempSrc: Oral Rectal   SpO2: 90%  95%  Weight:  102.7 kg       Constitutional: Drowsy, stable, acutely ill looking Vitals:   04/12/20 2124 04/12/20 2206 04/12/20 2303  BP: (!) 166/81  (!) 154/68  Pulse: (!) 110  (!) 109  Resp:   (!) 22  Temp: 99.8 F (37.7 C) (!) 100.9 F (38.3 C)   TempSrc: Oral Rectal   SpO2: 90%  95%  Weight:  102.7 kg    Eyes: PERRL, lids and conjunctivae normal ENMT: Mucous membranes are dry. Posterior pharynx clear of any exudate or lesions.Normal dentition.  Neck: normal, supple, no masses, no thyromegaly Respiratory: clear to auscultation bilaterally, no wheezing, no crackles. Normal respiratory effort. No accessory muscle use.  Cardiovascular: Sinus  tachycardia no murmurs / rubs / gallops. No extremity edema. 2+ pedal pulses. No carotid bruits.  Abdomen: Distended, tympanic, diffuse tenderness, mild rebound tenderness and guarding Musculoskeletal: no clubbing / cyanosis. No joint deformity upper and lower extremities. Good ROM, no contractures. Normal muscle tone.  Skin: no rashes, lesions, ulcers. No induration Neurologic: CN 2-12 grossly intact. Sensation intact, DTR normal. Strength 5/5 in all 4.  Psychiatric: Drowsy.     Labs on Admission: I have personally reviewed following labs and imaging studies  CBC: Recent Labs  Lab 04/12/20 2133  WBC 14.2*  NEUTROABS 13.0*  HGB 11.0*  HCT 33.7*  MCV 83.6  PLT 761   Basic Metabolic Panel: Recent Labs  Lab 04/12/20 2133  NA 134*  K 4.3  CL 89*  CO2 26  GLUCOSE 154*  BUN 59*  CREATININE 9.72*  CALCIUM 9.0   GFR: CrCl cannot be calculated (Unknown ideal weight.). Liver Function Tests: Recent Labs  Lab 04/12/20 2133  AST 19  ALT 15  ALKPHOS 137*  BILITOT 0.7  PROT 7.2  ALBUMIN 2.9*   No results for input(s): LIPASE, AMYLASE in the last 168 hours. No results for input(s): AMMONIA in the last 168 hours. Coagulation Profile: No results for input(s): INR, PROTIME in the last 168 hours. Cardiac Enzymes: No results for input(s): CKTOTAL, CKMB, CKMBINDEX, TROPONINI in the last 168 hours. BNP (last 3 results) No results for input(s): PROBNP in the last 8760 hours. HbA1C: No results for input(s): HGBA1C in the last  72 hours. CBG: No results for input(s): GLUCAP in the last 168 hours. Lipid Profile: No results for input(s): CHOL, HDL, LDLCALC, TRIG, CHOLHDL, LDLDIRECT in the last 72 hours. Thyroid Function Tests: No results for input(s): TSH, T4TOTAL, FREET4, T3FREE, THYROIDAB in the last 72 hours. Anemia Panel: No results for input(s): VITAMINB12, FOLATE, FERRITIN, TIBC, IRON, RETICCTPCT in the last 72 hours. Urine analysis:    Component Value Date/Time    COLORURINE YELLOW 06/15/2008 1856   APPEARANCEUR CLEAR 06/15/2008 1856   LABSPEC >1.030 (H) 06/15/2008 1856   PHURINE 5.0 06/15/2008 1856   GLUCOSEU 250 06/29/2012 1308   HGBUR TRACE (A) 06/15/2008 1856   BILIRUBINUR SMALL (A) 06/15/2008 1856   KETONESUR TRACE (A) 06/15/2008 1856   PROTEINUR >300 (A) 06/15/2008 1856   UROBILINOGEN 0.2 06/15/2008 1856   NITRITE NEGATIVE 06/15/2008 1856   LEUKOCYTESUR NEGATIVE 06/15/2008 1856   Sepsis Labs: @LABRCNTIP (procalcitonin:4,lacticidven:4) ) Recent Results (from the past 240 hour(s))  Respiratory Panel by RT PCR (Flu A&B, Covid) - Nasopharyngeal Swab     Status: None   Collection Time: 04/12/20  9:38 PM   Specimen: Nasopharyngeal Swab  Result Value Ref Range Status   SARS Coronavirus 2 by RT PCR NEGATIVE NEGATIVE Final    Comment: (NOTE) SARS-CoV-2 target nucleic acids are NOT DETECTED.  The SARS-CoV-2 RNA is generally detectable in upper respiratoy specimens during the acute phase of infection. The lowest concentration of SARS-CoV-2 viral copies this assay can detect is 131 copies/mL. A negative result does not preclude SARS-Cov-2 infection and should not be used as the sole basis for treatment or other patient management decisions. A negative result may occur with  improper specimen collection/handling, submission of specimen other than nasopharyngeal swab, presence of viral mutation(s) within the areas targeted by this assay, and inadequate number of viral copies (<131 copies/mL). A negative result must be combined with clinical observations, patient history, and epidemiological information. The expected result is Negative.  Fact Sheet for Patients:  PinkCheek.be  Fact Sheet for Healthcare Providers:  GravelBags.it  This test is no t yet approved or cleared by the [place] FDA and  has been authorized for detection and/or diagnosis of SARS-CoV-2 by FDA under an  Emergency Use Authorization (EUA). This EUA will remain  in effect (meaning this test can be used) for the duration of the COVID-19 declaration under Section 564(b)(1) of the Act, 21 U.S.C. section 360bbb-3(b)(1), unless the authorization is terminated or revoked sooner.     Influenza A by PCR NEGATIVE NEGATIVE Final   Influenza B by PCR NEGATIVE NEGATIVE Final    Comment: (NOTE) The Xpert Xpress SARS-CoV-2/FLU/RSV assay is intended as an aid in  the diagnosis of influenza from Nasopharyngeal swab specimens and  should not be used as a sole basis for treatment. Nasal washings and  aspirates are unacceptable for Xpert Xpress SARS-CoV-2/FLU/RSV  testing.  Fact Sheet for Patients: PinkCheek.be  Fact Sheet for Healthcare Providers: GravelBags.it  This test is not yet approved or cleared by the [place] FDA and  has been authorized for detection and/or diagnosis of SARS-CoV-2 by  FDA under an Emergency Use Authorization (EUA). This EUA will remain  in effect (meaning this test can be used) for the duration of the  Covid-19 declaration under Section 564(b)(1) of the Act, 21  U.S.C. section 360bbb-3(b)(1), unless the authorization is  terminated or revoked. Performed at [facility], [address], [place], [place] [address]      Radiological Exams on Admission: CT  Abdomen Pelvis Wo Contrast  Result Date: 04/12/2020 CLINICAL DATA:  79 year old male with abdominal pain. EXAM: CT ABDOMEN AND PELVIS WITHOUT CONTRAST TECHNIQUE: Multidetector CT imaging of the abdomen and pelvis was performed following the standard protocol without IV contrast. COMPARISON:  CT abdomen pelvis dated 01/20/2020. FINDINGS: Evaluation of this exam is limited in the absence of intravenous contrast. Lower chest: Partially visualized small left pleural effusion with compressive atelectasis of the majority of the visualized left lower lobe  versus pneumonia. Clusters of airspace density involving the right lung base noted which may represent atelectasis but concerning for pneumonia. Clinical correlation is recommended. There is 3 vessel coronary vascular calcification. There is pneumoperitoneum with pockets of air primarily in the upper abdomen anteriorly. The pneumoperitoneum is new since the prior CT and although may have been introduced via the peritoneal dialysis catheter, the possibility of bowel perforation is not excluded. Clinical correlation is recommended. There is a small ascites, new since the prior CT. Hepatobiliary: Probable early changes of cirrhosis. There is slight scalloping of the dome of the liver. No intrahepatic biliary dilatation. Cholecystectomy. Pancreas: Unremarkable. No pancreatic ductal dilatation or surrounding inflammatory changes. Spleen: Normal in size without focal abnormality. Adrenals/Urinary Tract: The adrenal glands unremarkable. Moderate bilateral renal parenchyma atrophy. There is a 3.5 cm right renal upper pole cyst with areas of peripheral calcification. This is similar to prior CT. Several additional smaller renal lesions noted which are not characterized on this noncontrast CT. There is no hydronephrosis on either side. The visualized ureters and urinary bladder appear unremarkable. Stomach/Bowel: There is sigmoid diverticulosis with muscular hypertrophy. Evaluation for possible active inflammation is limited due to ascites. There is no bowel obstruction. The appendix is not visualized due to ascites. Vascular/Lymphatic: Advanced aortoiliac atherosclerotic disease. The IVC is unremarkable. No portal venous gas. There is no adenopathy. Reproductive: The prostate and seminal vesicles are grossly unremarkable. No pelvic mass. Other: Small fat containing umbilical hernia. A peritoneal dialysis catheter is noted with tip in the right hemipelvis in similar position. Musculoskeletal: Degenerative changes of the  spine. No acute osseous pathology. IMPRESSION: 1. Pneumoperitoneum, new since the prior CT. Although this may have been introduced via the peritoneal dialysis catheter, the possibility of bowel perforation is not excluded. Clinical correlation is recommended. 2. Sigmoid diverticulosis.  No bowel obstruction. 3. Early changes of cirrhosis. 4. Small ascites, new since the prior CT. Peritoneal dialysis catheter in similar position as the prior CT. 5. Partially visualized small left pleural effusion with left lower lobe atelectasis versus pneumonia. Clusters of airspace density involving the right lung base may represent atelectasis but concerning for pneumonia. Clinical correlation is recommended. 6. Aortic Atherosclerosis (ICD10-I70.0). These results were called by telephone at the time of interpretation on 04/12/2020 at 11:18 pm to provider [facility] , who verbally acknowledged these results. Electronically Signed   By: Anner Crete M.D.   On: 04/12/2020 23:23   DG Chest 2 View  Result Date: 04/12/2020 CLINICAL DATA:  Dyspnea, fever EXAM: CHEST - 2 VIEW COMPARISON:  01/20/2020 FINDINGS: Lung volumes are small with asymmetric left-sided volume loss again noted. There has developed focal pulmonary infiltrate within the lingula and posterior segment of the left lower lobe, likely infectious in the appropriate clinical setting. Small left pleural effusion is present, possibly representing a parapneumonic effusion. No pneumothorax. Cardiac size within normal limits. No acute bone abnormality. IMPRESSION: Multifocal left lung infiltrate, likely infectious, with small associated parapneumonic effusion. Electronically Signed   By: Fidela Salisbury MD  On: 04/12/2020 23:05   CT Head Wo Contrast  Result Date: 04/12/2020 CLINICAL DATA:  Altered level of consciousness, fell out of bed EXAM: CT HEAD WITHOUT CONTRAST TECHNIQUE: Contiguous axial images were obtained from the base of the skull through the vertex  without intravenous contrast. 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 No acute infarct or hemorrhage. Lateral ventricles and midline structures are unremarkable. No new extra-axial fluid collections. No mass effect. Vascular: No hyperdense vessel or unexpected calcification. Skull: Normal. Negative for fracture or focal lesion. Sinuses/Orbits: Extensive sinonasal postsurgical changes again noted, incompletely evaluated. Paranasal sinuses are clear. Other: None. IMPRESSION: 1. No acute infarct or hemorrhage. 2. Decreased size of the right frontal subdural hematoma now measuring up to 9 mm in thickness, previously 22 mm. No mass effect. Electronically Signed   By: Randa Ngo M.D.   On: 04/12/2020 23:04   CT Cervical Spine Wo Contrast  Result Date: 04/12/2020 CLINICAL DATA:  Fall, neck trauma EXAM: CT CERVICAL SPINE WITHOUT CONTRAST TECHNIQUE: Multidetector CT imaging of the cervical spine was performed without intravenous contrast. Multiplanar CT image reconstructions were also generated. COMPARISON:  None. FINDINGS: The examination is is slightly limited by motion artifact. Alignment: There is straightening of the cervical spine, possibly positional in nature or related to underlying muscular spasm. No listhesis. Skull base and vertebrae: The craniocervical junction is unremarkable. The atlantodental interval is normal. No acute fracture of the cervical spine. No lytic or blastic bone lesion. Soft tissues and spinal canal: No prevertebral fluid or swelling. No visible canal hematoma. Disc levels: Review of the sagittal reformats demonstrates preservation of vertebral body heights. There is intervertebral disc space narrowing and endplate remodeling of T5-T7, most severe at C5-6 and C6-7 in keeping with changes of moderate to severe  degenerative disc disease. The prevertebral soft tissues are not thickened. The spinal canal is narrowed with posteriorly oriented disc osteophytes at C5-6 and C6-7 resulting in moderate central canal stenosis. Review of the axial images demonstrates multilevel uncovertebral arthrosis resulting in multilevel mild-to-moderate neural foraminal narrowing, seen bilaterally at C3-4, on the right at C5-6, and bilaterally, left greater than right, at C6-7. Posterior disc osteophyte complex at C5-6 and C6-7 results in moderate central canal stenosis with mild flattening of the thecal sac. Upper chest: Left pleural effusion is noted, partially visualized. Other: None significant IMPRESSION: No acute fracture of the cervical spine. Multilevel degenerative disc and degenerative joint disease resulting in multilevel neural foraminal narrowing and moderate central canal stenosis as described above. Left pleural effusion noted. Electronically Signed   By: Fidela Salisbury MD   On: 04/12/2020 23:12    EKG: Independently reviewed.  Sinus tachycardia  Assessment/Plan Principal Problem:   Sepsis ([place]) Active Problems:   Coronary atherosclerosis   Esophageal reflux   End stage renal disease (HCC)   COPD (chronic obstructive pulmonary disease) (HCC)   Spontaneous bacterial peritonitis ([place])     #1 sepsis: Secondary to spontaneous bacterial peritonitis.  Initiated on sepsis protocol including cefepime and metronidazole.  No IV fluid boluses due to end-stage renal disease.  Monitor response on await culture results.  Dialysis fluid exchange.  #2 end-stage renal disease: On peritoneal dialysis.  Nephrology to follow and advise on next steps.  #3 COPD: Continue home oxygen.  #4 spontaneous bacterial peritonitis: Continue as per #1 above.  Await fluids evaluation.  #  5 GERD: Continue with PPIs.  #6 coronary artery disease: Stable at baseline.  #7 essential hypertension: Continue home regimen.  #8 obstructive  sleep apnea: Patient is O2 dependent with a facemask.  Continue.    DVT prophylaxis: Heparin Code Status: Full code Family Communication: Wife over the phone Disposition Plan: Home Consults called: Consult nephrology in the morning Admission status: Inpatient  Severity of Illness: The appropriate patient status for this patient is INPATIENT. Inpatient status is judged to be reasonable and necessary in order to provide the required intensity of service to ensure the patient's safety. The patient's presenting symptoms, physical exam findings, and initial radiographic and laboratory data in the context of their chronic comorbidities is felt to place them at high risk for further clinical deterioration. Furthermore, it is not anticipated that the patient will be medically stable for discharge from the hospital within 2 midnights of admission. The following factors support the patient status of inpatient.   " The patient's presenting symptoms include abdominal pain with altered mental status. " The worrisome physical exam findings include diffuse abdominal tenderness with guarding. " The initial radiographic and laboratory data are worrisome because of evidence of sepsis. " The chronic co-morbidities include end-stage renal disease.   * I certify that at the point of admission it is my clinical judgment that the patient will require inpatient hospital care spanning beyond 2 midnights from the point of admission due to high intensity of service, high risk for further deterioration and high frequency of surveillance required.Barbette Merino MD [facility] Pager [phone]  If 7PM-7AM, please contact night-coverage [email or web address] Password [facility]  04/12/2020, 11:41 PM

## 2020-04-13 DIAGNOSIS — J189 Pneumonia, unspecified organism: Secondary | ICD-10-CM

## 2020-04-13 LAB — COMPREHENSIVE METABOLIC PANEL
ALT: 13 U/L (ref 0–44)
AST: 17 U/L (ref 15–41)
Albumin: 2.5 g/dL — ABNORMAL LOW (ref 3.5–5.0)
Alkaline Phosphatase: 105 U/L (ref 38–126)
Anion gap: 18 — ABNORMAL HIGH (ref 5–15)
BUN: 60 mg/dL — ABNORMAL HIGH (ref 8–23)
CO2: 22 mmol/L (ref 22–32)
Calcium: 8.3 mg/dL — ABNORMAL LOW (ref 8.9–10.3)
Chloride: 92 mmol/L — ABNORMAL LOW (ref 98–111)
Creatinine, Ser: 9.57 mg/dL — ABNORMAL HIGH (ref 0.61–1.24)
GFR, Estimated: 5 mL/min — ABNORMAL LOW (ref >60)
Glucose, Bld: 165 mg/dL — ABNORMAL HIGH (ref 70–99)
Potassium: 4.3 mmol/L (ref 3.5–5.1)
Sodium: 132 mmol/L — ABNORMAL LOW (ref 135–145)
Total Bilirubin: 0.9 mg/dL (ref 0.3–1.2)
Total Protein: 6.2 g/dL — ABNORMAL LOW (ref 6.5–8.1)

## 2020-04-13 LAB — CBC
HCT: 31.3 % — ABNORMAL LOW (ref 39.0–52.0)
Hemoglobin: 10 g/dL — ABNORMAL LOW (ref 13.0–17.0)
MCH: 26.9 pg (ref 26.0–34.0)
MCHC: 31.9 g/dL (ref 30.0–36.0)
MCV: 84.1 fL (ref 80.0–100.0)
Platelets: 266 10*3/uL (ref 150–400)
RBC: 3.72 MIL/uL — ABNORMAL LOW (ref 4.22–5.81)
RDW: 18.2 % — ABNORMAL HIGH (ref 11.5–15.5)
WBC: 17.5 10*3/uL — ABNORMAL HIGH (ref 4.0–10.5)
nRBC: 0 % (ref 0.0–0.2)

## 2020-04-13 LAB — TROPONIN I (HIGH SENSITIVITY): Troponin I (High Sensitivity): 26 ng/L — ABNORMAL HIGH (ref <18)

## 2020-04-13 LAB — CBG MONITORING, ED
Glucose-Capillary: 155 mg/dL — ABNORMAL HIGH (ref 70–99)
Glucose-Capillary: 149 mg/dL — ABNORMAL HIGH (ref 70–99)
Glucose-Capillary: 159 mg/dL — ABNORMAL HIGH (ref 70–99)
Glucose-Capillary: 137 mg/dL — ABNORMAL HIGH (ref 70–99)

## 2020-04-13 LAB — BODY FLUID CELL COUNT WITH DIFFERENTIAL
Eos, Fluid: 0 %
Lymphs, Fluid: 0 %
Monocyte-Macrophage-Serous Fluid: 5 %
Neutrophil Count, Fluid: 95 %
Other Cells, Fluid: 0 %
Total Nucleated Cell Count, Fluid: 18139 cu mm

## 2020-04-13 LAB — LACTIC ACID, PLASMA: Lactic Acid, Venous: 2.4 mmol/L (ref 0.5–1.9)

## 2020-04-13 LAB — MAGNESIUM: Magnesium: 1.7 mg/dL (ref 1.7–2.4)

## 2020-04-13 LAB — GLUCOSE, CAPILLARY: Glucose-Capillary: 182 mg/dL — ABNORMAL HIGH (ref 70–99)

## 2020-04-13 LAB — HEMOGLOBIN A1C
Hgb A1c MFr Bld: 7.5 % — ABNORMAL HIGH (ref 4.8–5.6)
Mean Plasma Glucose: 168.55 mg/dL

## 2020-04-13 LAB — CORTISOL-AM, BLOOD: Cortisol - AM: 20.3 ug/dL (ref 6.7–22.6)

## 2020-04-13 LAB — PROTIME-INR
INR: 1.1 (ref 0.8–1.2)
Prothrombin Time: 13.9 seconds (ref 11.4–15.2)

## 2020-04-13 LAB — PROCALCITONIN
Procalcitonin: 5.72 ng/mL
Procalcitonin: 6.89 ng/mL

## 2020-04-13 MED ORDER — RENA-VITE PO TABS
1.0000 | ORAL_TABLET | Freq: Every day | ORAL | Status: DC
  Administered 2020-04-13 – 2020-04-18 (×6): 1 via ORAL
  Filled 2020-04-13 (×7): qty 1

## 2020-04-13 MED ORDER — GENTAMICIN SULFATE 0.1 % EX CREA
1.0000 "application " | TOPICAL_CREAM | Freq: Every day | CUTANEOUS | Status: DC
  Administered 2020-04-14 – 2020-04-19 (×2): 1 via TOPICAL
  Filled 2020-04-13: qty 15

## 2020-04-13 MED ORDER — EPOETIN ALFA 10000 UNIT/ML IJ SOLN
10000.0000 [IU] | INTRAMUSCULAR | Status: DC
  Filled 2020-04-13: qty 1

## 2020-04-13 MED ORDER — PANTOPRAZOLE SODIUM 40 MG PO TBEC
40.0000 mg | DELAYED_RELEASE_TABLET | Freq: Every day | ORAL | Status: DC
  Administered 2020-04-14 – 2020-04-19 (×6): 40 mg via ORAL
  Filled 2020-04-13 (×6): qty 1

## 2020-04-13 MED ORDER — INSULIN ASPART 100 UNIT/ML ~~LOC~~ SOLN: 0.0000 [IU] | Freq: Every day | SUBCUTANEOUS | Status: DC

## 2020-04-13 MED ORDER — HEPARIN SODIUM (PORCINE) 5000 UNIT/ML IJ SOLN
5000.0000 [IU] | Freq: Three times a day (TID) | INTRAMUSCULAR | Status: DC
  Administered 2020-04-13 – 2020-04-19 (×15): 5000 [IU] via SUBCUTANEOUS
  Filled 2020-04-13 (×16): qty 1

## 2020-04-13 MED ORDER — ACETAMINOPHEN 325 MG PO TABS
650.0000 mg | ORAL_TABLET | Freq: Four times a day (QID) | ORAL | Status: DC | PRN
  Administered 2020-04-13 – 2020-04-17 (×2): 650 mg via ORAL
  Filled 2020-04-13 (×2): qty 2

## 2020-04-13 MED ORDER — CALCIUM CARBONATE-VITAMIN D 500-200 MG-UNIT PO TABS
ORAL_TABLET | Freq: Every day | ORAL | Status: DC
  Administered 2020-04-15 – 2020-04-19 (×4): 1 via ORAL
  Filled 2020-04-13 (×6): qty 1

## 2020-04-13 MED ORDER — ALBUTEROL SULFATE (2.5 MG/3ML) 0.083% IN NEBU
2.5000 mg | INHALATION_SOLUTION | Freq: Four times a day (QID) | RESPIRATORY_TRACT | Status: DC | PRN
  Administered 2020-04-16: 2.5 mg via RESPIRATORY_TRACT
  Filled 2020-04-13 (×2): qty 3

## 2020-04-13 MED ORDER — CALCIUM ACETATE (PHOS BINDER) 667 MG PO CAPS
667.0000 mg | ORAL_CAPSULE | Freq: Two times a day (BID) | ORAL | Status: DC | PRN
  Filled 2020-04-13: qty 1

## 2020-04-13 MED ORDER — METRONIDAZOLE IN NACL 5-0.79 MG/ML-% IV SOLN
500.0000 mg | Freq: Three times a day (TID) | INTRAVENOUS | Status: DC
  Administered 2020-04-13: 500 mg via INTRAVENOUS
  Filled 2020-04-13: qty 100

## 2020-04-13 MED ORDER — SODIUM CHLORIDE 0.9 % IV SOLN: 2.0000 g | INTRAVENOUS | Status: DC

## 2020-04-13 MED ORDER — EXTRANEAL 7.5 % IP SOLN
INTRAPERITONEAL | Status: DC
  Filled 2020-04-13 (×3): qty 2500

## 2020-04-13 MED ORDER — FENTANYL CITRATE (PF) 100 MCG/2ML IJ SOLN
12.5000 ug | INTRAMUSCULAR | Status: DC | PRN
  Administered 2020-04-13 – 2020-04-14 (×2): 12.5 ug via INTRAVENOUS
  Filled 2020-04-13 (×2): qty 2

## 2020-04-13 MED ORDER — HYDRALAZINE HCL 25 MG PO TABS
25.0000 mg | ORAL_TABLET | Freq: Three times a day (TID) | ORAL | Status: DC | PRN
  Administered 2020-04-17: 25 mg via ORAL
  Filled 2020-04-13: qty 1

## 2020-04-13 MED ORDER — ALBUTEROL SULFATE 2 MG/5ML PO SYRP
2.0000 mg | ORAL_SOLUTION | Freq: Two times a day (BID) | ORAL | Status: DC
  Administered 2020-04-14 – 2020-04-19 (×10): 2 mg via ORAL
  Filled 2020-04-13 (×15): qty 5

## 2020-04-13 MED ORDER — DELFLEX-LC/2.5% DEXTROSE 394 MOSM/L IP SOLN
INTRAPERITONEAL | Status: DC
  Filled 2020-04-13 (×4): qty 3000

## 2020-04-13 MED ORDER — LEVALBUTEROL HCL 0.63 MG/3ML IN NEBU
0.6300 mg | INHALATION_SOLUTION | Freq: Three times a day (TID) | RESPIRATORY_TRACT | Status: DC
  Administered 2020-04-13 – 2020-04-15 (×5): 0.63 mg via RESPIRATORY_TRACT
  Filled 2020-04-13 (×10): qty 3

## 2020-04-13 MED ORDER — PIPERACILLIN-TAZOBACTAM IN DEX 2-0.25 GM/50ML IV SOLN
2.2500 g | Freq: Three times a day (TID) | INTRAVENOUS | Status: DC
  Administered 2020-04-13 – 2020-04-19 (×15): 2.25 g via INTRAVENOUS
  Filled 2020-04-13 (×26): qty 50

## 2020-04-13 MED ORDER — ACETAMINOPHEN 650 MG RE SUPP: 650.0000 mg | Freq: Four times a day (QID) | RECTAL | Status: DC | PRN

## 2020-04-13 MED ORDER — UMECLIDINIUM BROMIDE 62.5 MCG/INH IN AEPB
1.0000 | INHALATION_SPRAY | Freq: Every day | RESPIRATORY_TRACT | Status: DC
  Administered 2020-04-14 – 2020-04-19 (×5): 1 via RESPIRATORY_TRACT
  Filled 2020-04-13 (×3): qty 7

## 2020-04-13 MED ORDER — SODIUM CHLORIDE 0.9 % IV SOLN: 1.0000 g | INTRAVENOUS | Status: DC

## 2020-04-13 MED ORDER — CALCIUM ACETATE (PHOS BINDER) 667 MG PO CAPS
1334.0000 mg | ORAL_CAPSULE | Freq: Three times a day (TID) | ORAL | Status: DC
  Administered 2020-04-16 – 2020-04-19 (×11): 1334 mg via ORAL
  Filled 2020-04-13 (×16): qty 2

## 2020-04-13 MED ORDER — ONDANSETRON HCL 4 MG/2ML IJ SOLN
4.0000 mg | Freq: Four times a day (QID) | INTRAMUSCULAR | Status: DC | PRN
  Administered 2020-04-13: 4 mg via INTRAVENOUS
  Filled 2020-04-13: qty 2

## 2020-04-13 MED ORDER — SODIUM CHLORIDE 0.9 % IV SOLN: 2.0000 g | Freq: Once | INTRAVENOUS | Status: DC

## 2020-04-13 MED ORDER — HEPARIN 1000 UNIT/ML FOR PERITONEAL DIALYSIS
500.0000 [IU] | INTRAMUSCULAR | Status: DC | PRN
  Filled 2020-04-13: qty 0.5

## 2020-04-13 MED ORDER — ISOSORBIDE MONONITRATE ER 30 MG PO TB24
30.0000 mg | ORAL_TABLET | Freq: Every day | ORAL | Status: DC
  Administered 2020-04-15 – 2020-04-19 (×5): 30 mg via ORAL
  Filled 2020-04-13 (×5): qty 1

## 2020-04-13 MED ORDER — MAGNESIUM SULFATE 2 GM/50ML IV SOLN
2.0000 g | Freq: Once | INTRAVENOUS | Status: AC
  Administered 2020-04-13: 2 g via INTRAVENOUS
  Filled 2020-04-13: qty 50

## 2020-04-13 MED ORDER — FLUTICASONE FUROATE-VILANTEROL 100-25 MCG/INH IN AEPB
1.0000 | INHALATION_SPRAY | Freq: Every day | RESPIRATORY_TRACT | Status: DC
  Administered 2020-04-14 – 2020-04-19 (×5): 1 via RESPIRATORY_TRACT
  Filled 2020-04-13 (×3): qty 28

## 2020-04-13 MED ORDER — CALCIUM ACETATE 667 MG PO CAPS: 667.0000 mg | ORAL_CAPSULE | ORAL | Status: DC

## 2020-04-13 MED ORDER — ONDANSETRON HCL 4 MG PO TABS: 4.0000 mg | ORAL_TABLET | Freq: Four times a day (QID) | ORAL | Status: DC | PRN

## 2020-04-13 MED ORDER — INSULIN ASPART 100 UNIT/ML ~~LOC~~ SOLN
0.0000 [IU] | Freq: Three times a day (TID) | SUBCUTANEOUS | Status: DC
  Administered 2020-04-13 – 2020-04-19 (×5): 1 [IU] via SUBCUTANEOUS
  Administered 2020-04-19: 3 [IU] via SUBCUTANEOUS
  Filled 2020-04-13 (×7): qty 1

## 2020-04-13 MED ORDER — INSULIN GLARGINE 100 UNIT/ML ~~LOC~~ SOLN
10.0000 [IU] | Freq: Every day | SUBCUTANEOUS | Status: DC
  Administered 2020-04-13 – 2020-04-18 (×6): 10 [IU] via SUBCUTANEOUS
  Filled 2020-04-13 (×7): qty 0.1

## 2020-04-13 MED ORDER — FLUTICASONE-UMECLIDIN-VILANT 100-62.5-25 MCG/INH IN AEPB: 1.0000 | INHALATION_SPRAY | Freq: Every day | RESPIRATORY_TRACT | Status: DC

## 2020-04-13 MED ORDER — VITAMIN D 25 MCG (1000 UNIT) PO TABS
2000.0000 [IU] | ORAL_TABLET | Freq: Two times a day (BID) | ORAL | Status: DC
  Administered 2020-04-13 – 2020-04-19 (×12): 2000 [IU] via ORAL
  Filled 2020-04-13 (×13): qty 2

## 2020-04-13 NOTE — ED Notes (Signed)
Pablo Ledger., NP notified of repeat Lactic Acid of 2.4

## 2020-04-13 NOTE — ED Notes (Signed)
This RN called to give report, to call back in 30 mins d/t issues with the room

## 2020-04-13 NOTE — Progress Notes (Addendum)
Attempted to complete assessment and finish admission questions. Patient was adamant about being left alone. This nurse asked patient would he allow me to take his vitals and take his sugar, patient says "didn't I tell you to leave me alone?'' "Im trying to sleep and I am not answering any questions while Im trying to sleep." This nurse attempted to explain the importance of taking his vitals and blood sugar. Patient says to "get out my room" Call light in place. Reminded patient to pull call light when in need of assistance. Will attempt at a later time.  2153- Patient allowed this nurse to give scheduled insulin after blood sugar was taken. Allowed tech to give peritoneal care as well as diaper change. Patient appears to be in better mood than earlier. He thanks this nurse. Assisted with ice water while giving meds. Refuses heparin sq at this time. Says he may take later if his stomach isn't hurting. IV antibiotics infusing at this time for 30 minutes.

## 2020-04-13 NOTE — ED Notes (Signed)
Pt changed of bowel incontinence with Shelda Pal. Repositioned in bed

## 2020-04-13 NOTE — ED Notes (Signed)
Lights dimmed for patient comfort. Pt repositioned in bed at this time. Pt ate approx 60% of breakfast tray. Call bell remains within reach of patient at this time.

## 2020-04-13 NOTE — Progress Notes (Signed)
[place], [place] 04/13/20  Subjective:   LOS: 1 Thomas [name] is a 79 y.o. male with medical history of end-stage renal disease on peritoneal dialysis, COPD, diverticular disease, hypertension, COVID-19 infection in January 2021, hyperlipidemia, hypothyroidism, nasal mass status post removal, and obstructive sleep apnea oxygen dependent with facemask who presents to ED for altered mental status.  Nephrology was referred for recommendations of ESRD on PD.  According to ED note, family reported an issue with his PD on 11/8. The connection from his dialysate to catheter came undone and there was significant leakage.   Patient is laying in bed, awake and alert and in no acute distress. Does require oxygen facemask ventilation. Patient reports he slipped out of bed yesterday, and is now at the hospital. When asked about his PD, he stated that his daughter and son-in-law take turns helping him with his PD sessions at home. He denies knowledge of a break in technique during his PD sessions. Discussed with him that there is high suspicion for peritoneal infection and he will likely need to be admitted for a few days while we figure out how best to manage this. Discussed that once he is admitted, we will plan for PD session today. Patient understood plan and did not have any other questions or concerns.    Objective:  Vital signs in last 24 hours:  Temp:  [98.6 F (37 C)-100.9 F (38.3 C)] 98.6 F (37 C) (11/10 0309) Pulse Rate:  [95-110] 95 (11/10 1230) Resp:  [18-24] 24 (11/10 1230) BP: (131-166)/(59-91) 163/66 (11/10 1230) SpO2:  [90 %-96 %] 92 % (11/10 1230) Weight:  [102.7 kg] 102.7 kg (11/09 2206)  Weight change:  Filed Weights   04/12/20 2206  Weight: 102.7 kg    Intake/Output:    Intake/Output Summary (Last 24 hours) at 04/13/2020 1507 Last data filed at 04/13/2020 0938 Gross per 24 hour  Intake 1602.67 ml  Output --  Net 1602.67 ml      Physical Exam: General: Laying in bed, no acute distress, eating ice chips  HEENT Rhinectomy, turbinates visible and pink  Pulm/lungs Normal respiratory effort, on facemask,  crackle in bases, + cough and clear sputum  CVS/Heart Regular rate and rhythm   Abdomen:  Diffuse abdominal tenderness to palpation, soft  Extremities: LE edema bilaterally  Neurologic: Awake and alert, speech difficult to understand secondary to rhinectomy  Skin: No rashes or lesions noted  Access: Abdominal peritoneal catheter       Basic Metabolic Panel:  Recent Labs  Lab 04/12/20 2133 04/13/20 0119 04/13/20 0300  NA 134*  --  132*  K 4.3  --  4.3  CL 89*  --  92*  CO2 26  --  22  GLUCOSE 154*  --  165*  BUN 59*  --  60*  CREATININE 9.72*  --  9.57*  CALCIUM 9.0  --  8.3*  MG  --  1.7  --      CBC: Recent Labs  Lab 04/12/20 2133 04/13/20 0300  WBC 14.2* 17.5*  NEUTROABS 13.0*  --   HGB 11.0* 10.0*  HCT 33.7* 31.3*  MCV 83.6 84.1  PLT 297 266      Lab Results  Component Value Date   HEPBSAG Negative 01/25/2019      Microbiology:  Recent Results (from the past 240 hour(s))  Culture, blood (routine x 2)     Status: None (Preliminary result)   Collection Time: 04/12/20  9:33 PM  Specimen: BLOOD  Result Value Ref Range Status   Specimen Description BLOOD RIGHT ASSIST CONTROL  Final   Special Requests   Final    BOTTLES DRAWN AEROBIC AND ANAEROBIC Blood Culture results may not be optimal due to an inadequate volume of blood received in culture bottles   Culture   Final    NO GROWTH < 12 HOURS Performed at [facility], [address], [place], [place] [address]    Report Status PENDING  Incomplete  Respiratory Panel by RT PCR (Flu A&B, Covid) - Nasopharyngeal Swab     Status: None   Collection Time: 04/12/20  9:38 PM   Specimen: Nasopharyngeal Swab  Result Value Ref Range Status   SARS Coronavirus 2 by RT PCR NEGATIVE NEGATIVE Final    Comment:  (NOTE) SARS-CoV-2 target nucleic acids are NOT DETECTED.  The SARS-CoV-2 RNA is generally detectable in upper respiratoy specimens during the acute phase of infection. The lowest concentration of SARS-CoV-2 viral copies this assay can detect is 131 copies/mL. A negative result does not preclude SARS-Cov-2 infection and should not be used as the sole basis for treatment or other patient management decisions. A negative result may occur with  improper specimen collection/handling, submission of specimen other than nasopharyngeal swab, presence of viral mutation(s) within the areas targeted by this assay, and inadequate number of viral copies (<131 copies/mL). A negative result must be combined with clinical observations, patient history, and epidemiological information. The expected result is Negative.  Fact Sheet for Patients:  PinkCheek.be  Fact Sheet for Healthcare Providers:  GravelBags.it  This test is no t yet approved or cleared by the [place] FDA and  has been authorized for detection and/or diagnosis of SARS-CoV-2 by FDA under an Emergency Use Authorization (EUA). This EUA will remain  in effect (meaning this test can be used) for the duration of the COVID-19 declaration under Section 564(b)(1) of the Act, 21 U.S.C. section 360bbb-3(b)(1), unless the authorization is terminated or revoked sooner.     Influenza A by PCR NEGATIVE NEGATIVE Final   Influenza B by PCR NEGATIVE NEGATIVE Final    Comment: (NOTE) The Xpert Xpress SARS-CoV-2/FLU/RSV assay is intended as an aid in  the diagnosis of influenza from Nasopharyngeal swab specimens and  should not be used as a sole basis for treatment. Nasal washings and  aspirates are unacceptable for Xpert Xpress SARS-CoV-2/FLU/RSV  testing.  Fact Sheet for Patients: PinkCheek.be  Fact Sheet for Healthcare  Providers: GravelBags.it  This test is not yet approved or cleared by the [place] FDA and  has been authorized for detection and/or diagnosis of SARS-CoV-2 by  FDA under an Emergency Use Authorization (EUA). This EUA will remain  in effect (meaning this test can be used) for the duration of the  Covid-19 declaration under Section 564(b)(1) of the Act, 21  U.S.C. section 360bbb-3(b)(1), unless the authorization is  terminated or revoked. Performed at [facility], [address] [place], [place] [address]   Peritoneal Fluid culture ( includes gram stain)     Status: None (Preliminary result)   Collection Time: 04/12/20 10:28 PM   Specimen: Peritoneal Washings; Peritoneal Fluid  Result Value Ref Range Status   Specimen Description   Final    PERITONEAL Performed at [facility], [address] [place], [place] [address]    Special Requests   Final    NONE Performed at [facility], [address] [place], [place] [address]  Gram Stain   Final    ABUNDANT WBC PRESENT, PREDOMINANTLY PMN NO ORGANISMS SEEN Performed at [facility], [place] [address], [place], [place] [address]    Culture PENDING  Incomplete   Report Status PENDING  Incomplete    Coagulation Studies: Recent Labs    04/13/20 0300  LABPROT 13.9  INR 1.1    Urinalysis: No results for input(s): COLORURINE, LABSPEC, PHURINE, GLUCOSEU, HGBUR, BILIRUBINUR, KETONESUR, PROTEINUR, UROBILINOGEN, NITRITE, LEUKOCYTESUR in the last 72 hours.  Invalid input(s): APPERANCEUR    Imaging: CT Abdomen Pelvis Wo Contrast  Result Date: 04/12/2020 CLINICAL DATA:  79 year old male with abdominal pain. EXAM: CT ABDOMEN AND PELVIS WITHOUT CONTRAST TECHNIQUE: Multidetector CT imaging of the abdomen and pelvis was performed following the standard protocol without IV contrast. COMPARISON:  CT abdomen pelvis dated 01/20/2020. FINDINGS: Evaluation of this exam is  limited in the absence of intravenous contrast. Lower chest: Partially visualized small left pleural effusion with compressive atelectasis of the majority of the visualized left lower lobe versus pneumonia. Clusters of airspace density involving the right lung base noted which may represent atelectasis but concerning for pneumonia. Clinical correlation is recommended. There is 3 vessel coronary vascular calcification. There is pneumoperitoneum with pockets of air primarily in the upper abdomen anteriorly. The pneumoperitoneum is new since the prior CT and although may have been introduced via the peritoneal dialysis catheter, the possibility of bowel perforation is not excluded. Clinical correlation is recommended. There is a small ascites, new since the prior CT. Hepatobiliary: Probable early changes of cirrhosis. There is slight scalloping of the dome of the liver. No intrahepatic biliary dilatation. Cholecystectomy. Pancreas: Unremarkable. No pancreatic ductal dilatation or surrounding inflammatory changes. Spleen: Normal in size without focal abnormality. Adrenals/Urinary Tract: The adrenal glands unremarkable. Moderate bilateral renal parenchyma atrophy. There is a 3.5 cm right renal upper pole cyst with areas of peripheral calcification. This is similar to prior CT. Several additional smaller renal lesions noted which are not characterized on this noncontrast CT. There is no hydronephrosis on either side. The visualized ureters and urinary bladder appear unremarkable. Stomach/Bowel: There is sigmoid diverticulosis with muscular hypertrophy. Evaluation for possible active inflammation is limited due to ascites. There is no bowel obstruction. The appendix is not visualized due to ascites. Vascular/Lymphatic: Advanced aortoiliac atherosclerotic disease. The IVC is unremarkable. No portal venous gas. There is no adenopathy. Reproductive: The prostate and seminal vesicles are grossly unremarkable. No pelvic mass.  Other: Small fat containing umbilical hernia. A peritoneal dialysis catheter is noted with tip in the right hemipelvis in similar position. Musculoskeletal: Degenerative changes of the spine. No acute osseous pathology. IMPRESSION: 1. Pneumoperitoneum, new since the prior CT. Although this may have been introduced via the peritoneal dialysis catheter, the possibility of bowel perforation is not excluded. Clinical correlation is recommended. 2. Sigmoid diverticulosis.  No bowel obstruction. 3. Early changes of cirrhosis. 4. Small ascites, new since the prior CT. Peritoneal dialysis catheter in similar position as the prior CT. 5. Partially visualized small left pleural effusion with left lower lobe atelectasis versus pneumonia. Clusters of airspace density involving the right lung base may represent atelectasis but concerning for pneumonia. Clinical correlation is recommended. 6. Aortic Atherosclerosis (ICD10-I70.0). These results were called by telephone at the time of interpretation on 04/12/2020 at 11:18 pm to provider [facility] , who verbally acknowledged these results. Electronically Signed   By: Anner Crete M.D.   On: 04/12/2020 23:23   DG Chest 2 View  Result Date:  04/12/2020 CLINICAL DATA:  Dyspnea, fever EXAM: CHEST - 2 VIEW COMPARISON:  01/20/2020 FINDINGS: Lung volumes are small with asymmetric left-sided volume loss again noted. There has developed focal pulmonary infiltrate within the lingula and posterior segment of the left lower lobe, likely infectious in the appropriate clinical setting. Small left pleural effusion is present, possibly representing a parapneumonic effusion. No pneumothorax. Cardiac size within normal limits. No acute bone abnormality. IMPRESSION: Multifocal left lung infiltrate, likely infectious, with small associated parapneumonic effusion. Electronically Signed   By: Fidela Salisbury MD   On: 04/12/2020 23:05   CT Head Wo Contrast  Result Date: 04/12/2020 CLINICAL  DATA:  Altered level of consciousness, fell out of bed EXAM: CT HEAD WITHOUT CONTRAST TECHNIQUE: Contiguous axial images were obtained from the base of the skull through the vertex without intravenous contrast. 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 No acute infarct or hemorrhage. Lateral ventricles and midline structures are unremarkable. No new extra-axial fluid collections. No mass effect. Vascular: No hyperdense vessel or unexpected calcification. Skull: Normal. Negative for fracture or focal lesion. Sinuses/Orbits: Extensive sinonasal postsurgical changes again noted, incompletely evaluated. Paranasal sinuses are clear. Other: None. IMPRESSION: 1. No acute infarct or hemorrhage. 2. Decreased size of the right frontal subdural hematoma now measuring up to 9 mm in thickness, previously 22 mm. No mass effect. Electronically Signed   By: Randa Ngo M.D.   On: 04/12/2020 23:04   CT Cervical Spine Wo Contrast  Result Date: 04/12/2020 CLINICAL DATA:  Fall, neck trauma EXAM: CT CERVICAL SPINE WITHOUT CONTRAST TECHNIQUE: Multidetector CT imaging of the cervical spine was performed without intravenous contrast. Multiplanar CT image reconstructions were also generated. COMPARISON:  None. FINDINGS: The examination is is slightly limited by motion artifact. Alignment: There is straightening of the cervical spine, possibly positional in nature or related to underlying muscular spasm. No listhesis. Skull base and vertebrae: The craniocervical junction is unremarkable. The atlantodental interval is normal. No acute fracture of the cervical spine. No lytic or blastic bone lesion. Soft tissues and spinal canal: No prevertebral fluid or swelling. No visible canal hematoma. Disc levels: Review of the sagittal reformats demonstrates preservation of  vertebral body heights. There is intervertebral disc space narrowing and endplate remodeling of E2-A8, most severe at C5-6 and C6-7 in keeping with changes of moderate to severe degenerative disc disease. The prevertebral soft tissues are not thickened. The spinal canal is narrowed with posteriorly oriented disc osteophytes at C5-6 and C6-7 resulting in moderate central canal stenosis. Review of the axial images demonstrates multilevel uncovertebral arthrosis resulting in multilevel mild-to-moderate neural foraminal narrowing, seen bilaterally at C3-4, on the right at C5-6, and bilaterally, left greater than right, at C6-7. Posterior disc osteophyte complex at C5-6 and C6-7 results in moderate central canal stenosis with mild flattening of the thecal sac. Upper chest: Left pleural effusion is noted, partially visualized. Other: None significant IMPRESSION: No acute fracture of the cervical spine. Multilevel degenerative disc and degenerative joint disease resulting in multilevel neural foraminal narrowing and moderate central canal stenosis as described above. Left pleural effusion noted. Electronically Signed   By: Fidela Salisbury MD   On: 04/12/2020 23:12     Medications:   . dialysis solution 2.5% low-MG/low-CA    . piperacillin-tazobactam (ZOSYN)  IV     . albuterol  2 mg Oral BID  .  calcium acetate  1,334 mg Oral TID WC  . calcium-vitamin D   Oral Daily  . cholecalciferol  2,000 Units Oral BID  . epoetin (EPOGEN/PROCRIT) injection  10,000 Units Subcutaneous Weekly  . extraneal (ICODEXTRIN) peritoneal dialysis solution   Intraperitoneal Q24H  . fluticasone furoate-vilanterol  1 puff Inhalation Daily   And  . umeclidinium bromide  1 puff Inhalation Daily  . gentamicin cream  1 application Topical Daily  . heparin injection (subcutaneous)  5,000 Units Subcutaneous Q8H  . insulin aspart  0-5 Units Subcutaneous QHS  . insulin aspart  0-6 Units Subcutaneous TID WC  . insulin glargine  10 Units  Subcutaneous QHS  . [START ON 04/14/2020] isosorbide mononitrate  30 mg Oral Daily  . levalbuterol  0.63 mg Nebulization Q8H  . multivitamin  1 tablet Oral QHS  . [START ON 04/14/2020] pantoprazole  40 mg Oral Daily   acetaminophen **OR** acetaminophen, albuterol, calcium acetate **AND** calcium acetate, fentaNYL (SUBLIMAZE) injection, heparin, hydrALAZINE, ondansetron **OR** ondansetron (ZOFRAN) IV  Assessment/ Plan:  79 y.o. male with ESRD on PD, COPD, Diverticular disease, HTN, Covid 19 infection in Jan 2021, HLD, hypothyroidism, nasal mass removal, h/o Sub dural hematoma, OSA was admitted on 04/12/2020 for  Principal Problem:   Sepsis [facility]) Active Problems:   Coronary atherosclerosis   Esophageal reflux   End stage renal disease ([place])   COPD (chronic obstructive pulmonary disease) ([place])   Spontaneous bacterial peritonitis ([place])  Sepsis ([place]) [A41.9]  #. ESRD Outpatient PD regimen is 5 cycles of 2800 + Last Fill of 2000cc - Will plan for PD today after he gets a room. Will dialize with 2.5% dextrose.   # Acute Peritonitis WBC count in fluid 18,000 with Neutrophil 95% - Currently on broad spectrum antibiotics until culture results are available.  #. Anemia of CKD  Lab Results  Component Value Date   HGB 10.0 (L) 04/13/2020   - Low dose EPO SQ weekly (wed)   #. Secondary hyperparathyroidism of renal origin N 25.81      Component Value Date/Time   PTH 218 (H) 01/26/2019 1228   Lab Results  Component Value Date   PHOS 6.5 (H) 06/17/2019   Monitor calcium and phos level during this admission Calcium 8.3 - On Phoslo and supplementation for calcium and Vit D.  #. Diabetes type 2 with CKD Hgb A1c MFr Bld (%)  Date Value  04/13/2020 7.5 (H)  - Currently on insulin regimen    LOS: [place] 11/10/20213:07 PM  [place], [place]

## 2020-04-13 NOTE — ED Notes (Signed)
Pt daughter updated by this RN with permission from patient.

## 2020-04-13 NOTE — ED Notes (Signed)
Assumed care of this patient at this time.

## 2020-04-13 NOTE — ED Notes (Signed)
This RN to bedside, introduced self to patient. CBG obtained by this RN. Pt requesting water at this time. Pt's Facetent O2 increased from 5L to 6L. Pt awakens easily with mild stimuli, oriented to person, place, situation, oriented to month, disoriented to year. Pt denies further needs, only requesting water with ice at this time, this RN explained will return with medications and water, pt states understanding.

## 2020-04-13 NOTE — ED Notes (Signed)
This RN and Deneise Lever, RN and [place], RN to bedside, pt cleansed of stool incontinence. Placed in clean, dry, brief at this time. Pt provided with fresh warm blankets. Lights dimmed for comfort. Pt denies further needs. Call bell within reach of patient at this time.

## 2020-04-13 NOTE — ED Notes (Signed)
Pt's oxygen is 89%. Pt's daughter at bedside stated his normal saturation while sleeping ranges from 88 to 91%. Sharion Settler, NP notified. NP advised that oxygen can be maintained at 89 to 91% and if needed we can increase to 6 lpm.

## 2020-04-13 NOTE — ED Notes (Signed)
As per daughter, pt does not produce any significant amount of urine.

## 2020-04-13 NOTE — ED Notes (Signed)
Sharion Settler, NP notified of 8 beat run of v tach that self corrected to NSR.

## 2020-04-13 NOTE — Sepsis Progress Note (Signed)
Notified bedside nurse of need to draw repeat lactic acid. 

## 2020-04-13 NOTE — ED Notes (Signed)
Pt alert and oriented at this time, NAD noted.

## 2020-04-13 NOTE — ED Notes (Signed)
Pt  Noted to have episode of diarrhea. Pt cleaned, new linen on bed, pt repositioned in bed. Pt on chux and depends.

## 2020-04-13 NOTE — ED Notes (Signed)
Pt changed of bowel incontinence with Johnney Ou.  Pt reports nausea and spitting up.  Repositioned in bed.

## 2020-04-13 NOTE — Progress Notes (Signed)
Pharmacy Antibiotic Note  Thomas [name] is a 79 y.o. male admitted on 04/12/2020 with sepsis.  Pharmacy has been consulted for Cefepime dosing. Patient has PMH of ESRD on HD.   Plan: Will adjust dose to Cefepime 1gm IV q24hrs   Height: 5' 9.02" (175.3 cm) Weight: 102.7 kg (226 lb 8 oz) IBW/kg (Calculated) : 70.74  Temp (24hrs), Avg:99.8 F (37.7 C), Min:98.6 F (37 C), Max:100.9 F (38.3 C)  Recent Labs  Lab 04/12/20 2133 04/13/20 0119 04/13/20 0300  WBC 14.2*  --  17.5*  CREATININE 9.72*  --  9.57*  LATICACIDVEN 2.4* 2.4*  --     Estimated Creatinine Clearance: 7.4 mL/min (A) (by C-G formula based on SCr of 9.57 mg/dL (H)).    Allergies  Allergen Reactions  . Ace Inhibitors Anaphylaxis    Swelling in throat and neck  . Statins Anaphylaxis and Swelling    Caused feet, tongue and throat to swell.  . Codeine Other (See Comments)    Muscle Spasms  . Tape Hives    Antimicrobials this admission: 11/10 Vancomycin x 1  11/09 Cefepime>> 11/10  Flagyl>>   Microbiology results:  BCx: pending   Thank you for allowing pharmacy to be a part of this patient's care.  Pernell Dupre, PharmD, BCPS Clinical Pharmacist 04/13/2020 11:03 AM

## 2020-04-13 NOTE — ED Notes (Signed)
Pt sat up in bed, this RN assisted patient with set up of breakfast tray.

## 2020-04-13 NOTE — ED Notes (Signed)
Pt continues to sit up in bed eating breakfast. Pt intermittently holds up face tent as needed for oxygen while eating. Pt able to hold face tent up as needed and place face tent on/take it off at his convenience.

## 2020-04-13 NOTE — Progress Notes (Signed)
PROGRESS NOTE  Thomas [name] OVZ:[id number] DOB: 10-08-1940 DOA: 04/12/2020 PCP: [facility]  HPI/Recap of past 24 hours: HPI from Dr [name] is a 79 y.o. male with medical history significant of end-stage renal disease on peritoneal dialysis, COPD, diverticular disease, hypertension, COVID-19 infection in January of this year, hyperlipidemia, hypothyroidism, nasal mass status post nose removal, history of subdural hematoma and obstructive sleep apnea oxygen dependent with facemask who was brought in with abdominal pain fever and chills.  Patient apparently had some issues with his peritoneal dialysis fluid and nonsterile procedure and exchange of fluid was performed few days ago. Started having significant abdominal pain altered mental status and fever.  Patient admitted for further management.  In the ER, patient was noted to have diffuse abdominal tenderness with rebound consistent with peritonitis.  Aspiration of his peritoneal fluid showed cloudy fluid which has been sent to the lab, peritoneal fluid culture and blood cultures have been taken, started on antibiotics with suspected spontaneous bacterial peritonitis.  Noted to have temperature 100.9 blood pressure 166/81 pulse 110 respiratory rate of 22 oxygen sat 90%.  Sodium is 134 potassium 4.3 chloride of 89 CO2 26 glucose 154 BUN 59 creatinine 9.72 and a gap of 19.  Troponin is 24 lactic acid 2.4 white count 14.2 hemoglobin 11.1 platelets of 297.  COVID-19 screen is negative.    Today, patient still reports generalized abdominal pain, denies any nausea/vomiting, chest pain, shortness of breath.  Had an episode of loose stool earlier this a.m.    Assessment/Plan: Principal Problem:   Sepsis ([place]) Active Problems:   Coronary atherosclerosis   Esophageal reflux   End stage renal disease (HCC)   COPD (chronic obstructive pulmonary disease) (HCC)   Spontaneous bacterial peritonitis (HCC)   Severe sepsis likely  2/2 SBP On admission, temp 100.9, heart rate 110, respiratory rate 22, WBC 14.2, lactic acid 2.4 with altered mental status Likely complication from peritoneal dialysis Currently afebrile, with leukocytosis Lactic acid, procalcitonin elevated, will trend BC x2 pending Peritoneal fluid analysis culture pending Continue IV Zosyn Monitor closely  Acute on chronic hypoxic respiratory failure Possible CAP History of COPD Patient noted to be tachypneic, requiring about 6 L of O2 via facemask, 5 L at home Chest x-ray showing multifocal left lung infiltrate, with small associated parapneumonic effusion Continue IV Zosyn for now, duo nebs, inhaler Supplemental O2  ESRD On PD Nephrology consulted  Anemia of chronic kidney disease Baseline hemoglobin around 12 Anemia panel pending Daily CBC, monitor for any signs of bleeding  Hypertension Uncontrolled Continue home Imdur, hydralazine as needed  Diabetes mellitus type 2 A1c on 04/13/2020 was 7.5 SSI, Accu-Cheks, Lantus, hypoglycemic protocol  GERD Continue PPI  OSA Continue oxygen via facemask  Obesity Lifestyle modification advised        Malnutrition Type:      Malnutrition Characteristics:      Nutrition Interventions:       Estimated body mass index is 33.43 kg/m as calculated from the following:   Height as of this encounter: 5' 9.02" (1.753 m).   Weight as of this encounter: 102.7 kg.     Code Status: Full  Family Communication: Plan to discuss with family  Disposition Plan: Status is: Inpatient  Remains inpatient appropriate because:Inpatient level of care appropriate due to severity of illness   Dispo: The patient is from: Home              Anticipated d/c is to: Home  Anticipated d/c date is: 3 days              Patient currently is not medically stable to d/c.    Consultants:  Nephrology  Procedures:  None  Antimicrobials:  IV Zosyn  DVT prophylaxis: Heparin  [place]   Objective: Vitals:   04/13/20 0830 04/13/20 1030 04/13/20 1205 04/13/20 1230  BP: 139/70 (!) 153/67 (!) 165/59 (!) 163/66  Pulse: (!) 101 96 99 95  Resp: 20 (!) 22  (!) 24  Temp:      TempSrc:      SpO2: 95% 96% 94% 92%  Weight:      Height:  5' 9.02" (1.753 m)      Intake/Output Summary (Last 24 hours) at 04/13/2020 1403 Last data filed at 04/13/2020 2542 Gross per 24 hour  Intake 1602.67 ml  Output --  Net 1602.67 ml   Filed Weights   04/12/20 2206  Weight: 102.7 kg    Exam:  General:  Acutely ill-appearing, absent nose s/p surgery, lethargic, awake/alert  Cardiovascular: S1, S2 present  Respiratory:  Diminished breath sounds bilaterally  Abdomen: Soft, + generalized tenderness, nondistended, bowel sounds present  Musculoskeletal: No bilateral pedal edema noted  Skin: Normal  Psychiatry: Normal mood   Data Reviewed: CBC: Recent Labs  Lab 04/12/20 2133 04/13/20 0300  WBC 14.2* 17.5*  NEUTROABS 13.0*  --   HGB 11.0* 10.0*  HCT 33.7* 31.3*  MCV 83.6 84.1  PLT 297 706   Basic Metabolic Panel: Recent Labs  Lab 04/12/20 2133 04/13/20 0119 04/13/20 0300  NA 134*  --  132*  K 4.3  --  4.3  CL 89*  --  92*  CO2 26  --  22  GLUCOSE 154*  --  165*  BUN 59*  --  60*  CREATININE 9.72*  --  9.57*  CALCIUM 9.0  --  8.3*  MG  --  1.7  --    GFR: Estimated Creatinine Clearance: 7.4 mL/min (A) (by C-G formula based on SCr of 9.57 mg/dL (H)). Liver Function Tests: Recent Labs  Lab 04/12/20 2133 04/13/20 0300  AST 19 17  ALT 15 13  ALKPHOS 137* 105  BILITOT 0.7 0.9  PROT 7.2 6.2*  ALBUMIN 2.9* 2.5*   No results for input(s): LIPASE, AMYLASE in the last 168 hours. No results for input(s): AMMONIA in the last 168 hours. Coagulation Profile: Recent Labs  Lab 04/13/20 0300  INR 1.1   Cardiac Enzymes: No results for input(s): CKTOTAL, CKMB, CKMBINDEX, TROPONINI in the last 168 hours. BNP (last 3 results) No results for input(s): PROBNP  in the last 8760 hours. HbA1C: Recent Labs    04/13/20 0252  HGBA1C 7.5*   CBG: Recent Labs  Lab 04/13/20 0304 04/13/20 0807 04/13/20 1214  GLUCAP 155* 149* 159*   Lipid Profile: No results for input(s): CHOL, HDL, LDLCALC, TRIG, CHOLHDL, LDLDIRECT in the last 72 hours. Thyroid Function Tests: No results for input(s): TSH, T4TOTAL, FREET4, T3FREE, THYROIDAB in the last 72 hours. Anemia Panel: No results for input(s): VITAMINB12, FOLATE, FERRITIN, TIBC, IRON, RETICCTPCT in the last 72 hours. Urine analysis:    Component Value Date/Time   COLORURINE YELLOW 06/15/2008 1856   APPEARANCEUR CLEAR 06/15/2008 1856   LABSPEC >1.030 (H) 06/15/2008 1856   PHURINE 5.0 06/15/2008 1856   GLUCOSEU 250 06/29/2012 1308   HGBUR TRACE (A) 06/15/2008 1856   BILIRUBINUR SMALL (A) 06/15/2008 1856   KETONESUR TRACE (A) 06/15/2008 1856   PROTEINUR >300 (A)  06/15/2008 1856   UROBILINOGEN 0.2 06/15/2008 1856   NITRITE NEGATIVE 06/15/2008 1856   LEUKOCYTESUR NEGATIVE 06/15/2008 1856   Sepsis Labs: @LABRCNTIP (procalcitonin:4,lacticidven:4)  ) Recent Results (from the past 240 hour(s))  Culture, blood (routine x 2)     Status: None (Preliminary result)   Collection Time: 04/12/20  9:33 PM   Specimen: BLOOD  Result Value Ref Range Status   Specimen Description BLOOD RIGHT ASSIST CONTROL  Final   Special Requests   Final    BOTTLES DRAWN AEROBIC AND ANAEROBIC Blood Culture results may not be optimal due to an inadequate volume of blood received in culture bottles   Culture   Final    NO GROWTH < 12 HOURS Performed at [facility], [address], [place], [place] [address]    Report Status PENDING  Incomplete  Respiratory Panel by RT PCR (Flu A&B, Covid) - Nasopharyngeal Swab     Status: None   Collection Time: 04/12/20  9:38 PM   Specimen: Nasopharyngeal Swab  Result Value Ref Range Status   SARS Coronavirus 2 by RT PCR NEGATIVE NEGATIVE Final    Comment: (NOTE) SARS-CoV-2  target nucleic acids are NOT DETECTED.  The SARS-CoV-2 RNA is generally detectable in upper respiratoy specimens during the acute phase of infection. The lowest concentration of SARS-CoV-2 viral copies this assay can detect is 131 copies/mL. A negative result does not preclude SARS-Cov-2 infection and should not be used as the sole basis for treatment or other patient management decisions. A negative result may occur with  improper specimen collection/handling, submission of specimen other than nasopharyngeal swab, presence of viral mutation(s) within the areas targeted by this assay, and inadequate number of viral copies (<131 copies/mL). A negative result must be combined with clinical observations, patient history, and epidemiological information. The expected result is Negative.  Fact Sheet for Patients:  PinkCheek.be  Fact Sheet for Healthcare Providers:  GravelBags.it  This test is no t yet approved or cleared by the [place] FDA and  has been authorized for detection and/or diagnosis of SARS-CoV-2 by FDA under an Emergency Use Authorization (EUA). This EUA will remain  in effect (meaning this test can be used) for the duration of the COVID-19 declaration under Section 564(b)(1) of the Act, 21 U.S.C. section 360bbb-3(b)(1), unless the authorization is terminated or revoked sooner.     Influenza A by PCR NEGATIVE NEGATIVE Final   Influenza B by PCR NEGATIVE NEGATIVE Final    Comment: (NOTE) The Xpert Xpress SARS-CoV-2/FLU/RSV assay is intended as an aid in  the diagnosis of influenza from Nasopharyngeal swab specimens and  should not be used as a sole basis for treatment. Nasal washings and  aspirates are unacceptable for Xpert Xpress SARS-CoV-2/FLU/RSV  testing.  Fact Sheet for Patients: PinkCheek.be  Fact Sheet for Healthcare  Providers: GravelBags.it  This test is not yet approved or cleared by the [place] FDA and  has been authorized for detection and/or diagnosis of SARS-CoV-2 by  FDA under an Emergency Use Authorization (EUA). This EUA will remain  in effect (meaning this test can be used) for the duration of the  Covid-19 declaration under Section 564(b)(1) of the Act, 21  U.S.C. section 360bbb-3(b)(1), unless the authorization is  terminated or revoked. Performed at [facility], [address] [place],  [address]   Peritoneal Fluid culture ( includes gram stain)     Status: None (Preliminary result)   Collection Time: 04/12/20 10:28 PM   Specimen: Peritoneal Washings;  Peritoneal Fluid  Result Value Ref Range Status   Specimen Description   Final    PERITONEAL Performed at [facility] [address] [place], [place] [address]    Special Requests   Final    NONE Performed at [facility], [address] [place], [place] [address]    Gram Stain   Final    ABUNDANT WBC PRESENT, PREDOMINANTLY PMN NO ORGANISMS SEEN Performed at [facility], [place] [address], [place], [place] [address]    Culture PENDING  Incomplete   Report Status PENDING  Incomplete      Studies: CT Abdomen Pelvis Wo Contrast  Result Date: 04/12/2020 CLINICAL DATA:  79 year old male with abdominal pain. EXAM: CT ABDOMEN AND PELVIS WITHOUT CONTRAST TECHNIQUE: Multidetector CT imaging of the abdomen and pelvis was performed following the standard protocol without IV contrast. COMPARISON:  CT abdomen pelvis dated 01/20/2020. FINDINGS: Evaluation of this exam is limited in the absence of intravenous contrast. Lower chest: Partially visualized small left pleural effusion with compressive atelectasis of the majority of the visualized left lower lobe versus pneumonia. Clusters of airspace density involving the right lung base noted which may represent  atelectasis but concerning for pneumonia. Clinical correlation is recommended. There is 3 vessel coronary vascular calcification. There is pneumoperitoneum with pockets of air primarily in the upper abdomen anteriorly. The pneumoperitoneum is new since the prior CT and although may have been introduced via the peritoneal dialysis catheter, the possibility of bowel perforation is not excluded. Clinical correlation is recommended. There is a small ascites, new since the prior CT. Hepatobiliary: Probable early changes of cirrhosis. There is slight scalloping of the dome of the liver. No intrahepatic biliary dilatation. Cholecystectomy. Pancreas: Unremarkable. No pancreatic ductal dilatation or surrounding inflammatory changes. Spleen: Normal in size without focal abnormality. Adrenals/Urinary Tract: The adrenal glands unremarkable. Moderate bilateral renal parenchyma atrophy. There is a 3.5 cm right renal upper pole cyst with areas of peripheral calcification. This is similar to prior CT. Several additional smaller renal lesions noted which are not characterized on this noncontrast CT. There is no hydronephrosis on either side. The visualized ureters and urinary bladder appear unremarkable. Stomach/Bowel: There is sigmoid diverticulosis with muscular hypertrophy. Evaluation for possible active inflammation is limited due to ascites. There is no bowel obstruction. The appendix is not visualized due to ascites. Vascular/Lymphatic: Advanced aortoiliac atherosclerotic disease. The IVC is unremarkable. No portal venous gas. There is no adenopathy. Reproductive: The prostate and seminal vesicles are grossly unremarkable. No pelvic mass. Other: Small fat containing umbilical hernia. A peritoneal dialysis catheter is noted with tip in the right hemipelvis in similar position. Musculoskeletal: Degenerative changes of the spine. No acute osseous pathology. IMPRESSION: 1. Pneumoperitoneum, new since the prior CT. Although this  may have been introduced via the peritoneal dialysis catheter, the possibility of bowel perforation is not excluded. Clinical correlation is recommended. 2. Sigmoid diverticulosis.  No bowel obstruction. 3. Early changes of cirrhosis. 4. Small ascites, new since the prior CT. Peritoneal dialysis catheter in similar position as the prior CT. 5. Partially visualized small left pleural effusion with left lower lobe atelectasis versus pneumonia. Clusters of airspace density involving the right lung base may represent atelectasis but concerning for pneumonia. Clinical correlation is recommended. 6. Aortic Atherosclerosis (ICD10-I70.0). These results were called by telephone at the time of interpretation on 04/12/2020 at 11:18 pm to provider [facility] , who verbally acknowledged these results. Electronically Signed   By: Laren Everts.D.  On: 04/12/2020 23:23   DG Chest 2 View  Result Date: 04/12/2020 CLINICAL DATA:  Dyspnea, fever EXAM: CHEST - 2 VIEW COMPARISON:  01/20/2020 FINDINGS: Lung volumes are small with asymmetric left-sided volume loss again noted. There has developed focal pulmonary infiltrate within the lingula and posterior segment of the left lower lobe, likely infectious in the appropriate clinical setting. Small left pleural effusion is present, possibly representing a parapneumonic effusion. No pneumothorax. Cardiac size within normal limits. No acute bone abnormality. IMPRESSION: Multifocal left lung infiltrate, likely infectious, with small associated parapneumonic effusion. Electronically Signed   By: Fidela Salisbury MD   On: 04/12/2020 23:05   CT Head Wo Contrast  Result Date: 04/12/2020 CLINICAL DATA:  Altered level of consciousness, fell out of bed EXAM: CT HEAD WITHOUT CONTRAST TECHNIQUE: Contiguous axial images were obtained from the base of the skull through the vertex without intravenous contrast. COMPARISON:  05/24/2018 FINDINGS: Brain: The chronic right frontal subdural  hematoma seen previously has diminished significantly in size, now measuring only 9 mm in thickness where previously it had measured 22 mm. No acute infarct or hemorrhage. Lateral ventricles and midline structures are unremarkable. No new extra-axial fluid collections. No mass effect. Vascular: No hyperdense vessel or unexpected calcification. Skull: Normal. Negative for fracture or focal lesion. Sinuses/Orbits: Extensive sinonasal postsurgical changes again noted, incompletely evaluated. Paranasal sinuses are clear. Other: None. IMPRESSION: 1. No acute infarct or hemorrhage. 2. Decreased size of the right frontal subdural hematoma now measuring up to 9 mm in thickness, previously 22 mm. No mass effect. Electronically Signed   By: Randa Ngo M.D.   On: 04/12/2020 23:04   CT Cervical Spine Wo Contrast  Result Date: 04/12/2020 CLINICAL DATA:  Fall, neck trauma EXAM: CT CERVICAL SPINE WITHOUT CONTRAST TECHNIQUE: Multidetector CT imaging of the cervical spine was performed without intravenous contrast. Multiplanar CT image reconstructions were also generated. COMPARISON:  None. FINDINGS: The examination is is slightly limited by motion artifact. Alignment: There is straightening of the cervical spine, possibly positional in nature or related to underlying muscular spasm. No listhesis. Skull base and vertebrae: The craniocervical junction is unremarkable. The atlantodental interval is normal. No acute fracture of the cervical spine. No lytic or blastic bone lesion. Soft tissues and spinal canal: No prevertebral fluid or swelling. No visible canal hematoma. Disc levels: Review of the sagittal reformats demonstrates preservation of vertebral body heights. There is intervertebral disc space narrowing and endplate remodeling of A2-Z3, most severe at C5-6 and C6-7 in keeping with changes of moderate to severe degenerative disc disease. The prevertebral soft tissues are not thickened. The spinal canal is narrowed with  posteriorly oriented disc osteophytes at C5-6 and C6-7 resulting in moderate central canal stenosis. Review of the axial images demonstrates multilevel uncovertebral arthrosis resulting in multilevel mild-to-moderate neural foraminal narrowing, seen bilaterally at C3-4, on the right at C5-6, and bilaterally, left greater than right, at C6-7. Posterior disc osteophyte complex at C5-6 and C6-7 results in moderate central canal stenosis with mild flattening of the thecal sac. Upper chest: Left pleural effusion is noted, partially visualized. Other: None significant IMPRESSION: No acute fracture of the cervical spine. Multilevel degenerative disc and degenerative joint disease resulting in multilevel neural foraminal narrowing and moderate central canal stenosis as described above. Left pleural effusion noted. Electronically Signed   By: Fidela Salisbury MD   On: 04/12/2020 23:12    Scheduled Meds: . insulin aspart  0-5 Units Subcutaneous QHS  . insulin aspart  0-6 Units  Subcutaneous TID WC    Continuous Infusions: . piperacillin-tazobactam (ZOSYN)  IV       LOS: 1 day     Alma Friendly, MD [facility]  If 7PM-7AM, please contact night-coverage [email or web address] 04/13/2020, 2:03 PM

## 2020-04-13 NOTE — Progress Notes (Signed)
Pharmacy Antibiotic Note  Thomas [name] is a 79 y.o. male admitted on 04/12/2020 with sepsis.  Pharmacy has been consulted for Cefepime dosing.  Plan: Cefepime 2gm IV q24hrs (renally adjusted)  Weight: 102.7 kg (226 lb 8 oz)  Temp (24hrs), Avg:100.4 F (38 C), Min:99.8 F (37.7 C), Max:100.9 F (38.3 C)  Recent Labs  Lab 04/12/20 2133  WBC 14.2*  CREATININE 9.72*  LATICACIDVEN 2.4*    CrCl cannot be calculated (Unknown ideal weight.).    Allergies  Allergen Reactions  . Ace Inhibitors Anaphylaxis    Swelling in throat and neck  . Statins Anaphylaxis and Swelling    Caused feet, tongue and throat to swell.  . Codeine Other (See Comments)    Muscle Spasms  . Tape Hives    Antimicrobials this admission:   >>    >>   Dose adjustments this admission:   Microbiology results:  BCx:   UCx:    Sputum:    MRSA PCR:   Thank you for allowing pharmacy to be a part of this patient's care.  Hart Robinsons A 04/13/2020 1:41 AM

## 2020-04-13 NOTE — ED Notes (Addendum)
Lab came down and attempted to get 2nd set of blood cultures. Lab was unable to obtain. Lab stated they will try and send someone else to obtain the second set of blood cultures

## 2020-04-13 NOTE — ED Notes (Signed)
This RN to bedside, pt repositioned in bed by this RN and Jonni Sanger, Careers adviser. Pt provided with ice chips per his request. Dr. Candiss Norse at bedside to assess patient. Call bell remains within reach of patient. Pt denies further needs.

## 2020-04-14 DIAGNOSIS — Z515 Encounter for palliative care: Secondary | ICD-10-CM

## 2020-04-14 DIAGNOSIS — T8571XA Infection and inflammatory reaction due to peritoneal dialysis catheter, initial encounter: Secondary | ICD-10-CM

## 2020-04-14 DIAGNOSIS — Z66 Do not resuscitate: Secondary | ICD-10-CM

## 2020-04-14 DIAGNOSIS — Z7189 Other specified counseling: Secondary | ICD-10-CM

## 2020-04-14 LAB — IRON AND TIBC
Iron: 17 ug/dL — ABNORMAL LOW (ref 45–182)
Saturation Ratios: 12 % — ABNORMAL LOW (ref 17.9–39.5)
TIBC: 139 ug/dL — ABNORMAL LOW (ref 250–450)
UIBC: 122 ug/dL

## 2020-04-14 LAB — CBC WITH DIFFERENTIAL/PLATELET
Abs Immature Granulocytes: 0.14 10*3/uL — ABNORMAL HIGH (ref 0.00–0.07)
Basophils Absolute: 0.1 10*3/uL (ref 0.0–0.1)
Basophils Relative: 0 %
Eosinophils Absolute: 0.2 10*3/uL (ref 0.0–0.5)
Eosinophils Relative: 2 %
HCT: 33.3 % — ABNORMAL LOW (ref 39.0–52.0)
Hemoglobin: 10.6 g/dL — ABNORMAL LOW (ref 13.0–17.0)
Immature Granulocytes: 1 %
Lymphocytes Relative: 7 %
Lymphs Abs: 1.2 10*3/uL (ref 0.7–4.0)
MCH: 26.8 pg (ref 26.0–34.0)
MCHC: 31.8 g/dL (ref 30.0–36.0)
MCV: 84.1 fL (ref 80.0–100.0)
Monocytes Absolute: 0.9 10*3/uL (ref 0.1–1.0)
Monocytes Relative: 5 %
Neutro Abs: 13.3 10*3/uL — ABNORMAL HIGH (ref 1.7–7.7)
Neutrophils Relative %: 85 %
Platelets: 264 10*3/uL (ref 150–400)
RBC: 3.96 MIL/uL — ABNORMAL LOW (ref 4.22–5.81)
RDW: 18.3 % — ABNORMAL HIGH (ref 11.5–15.5)
WBC: 15.8 10*3/uL — ABNORMAL HIGH (ref 4.0–10.5)
nRBC: 0 % (ref 0.0–0.2)

## 2020-04-14 LAB — RENAL FUNCTION PANEL
Albumin: 2.5 g/dL — ABNORMAL LOW (ref 3.5–5.0)
Anion gap: 16 — ABNORMAL HIGH (ref 5–15)
BUN: 62 mg/dL — ABNORMAL HIGH (ref 8–23)
CO2: 28 mmol/L (ref 22–32)
Calcium: 8.3 mg/dL — ABNORMAL LOW (ref 8.9–10.3)
Chloride: 88 mmol/L — ABNORMAL LOW (ref 98–111)
Creatinine, Ser: 9.42 mg/dL — ABNORMAL HIGH (ref 0.61–1.24)
GFR, Estimated: 5 mL/min — ABNORMAL LOW (ref >60)
Glucose, Bld: 211 mg/dL — ABNORMAL HIGH (ref 70–99)
Phosphorus: 4.9 mg/dL — ABNORMAL HIGH (ref 2.5–4.6)
Potassium: 3.7 mmol/L (ref 3.5–5.1)
Sodium: 132 mmol/L — ABNORMAL LOW (ref 135–145)

## 2020-04-14 LAB — C DIFFICILE QUICK SCREEN W PCR REFLEX
C Diff antigen: NEGATIVE
C Diff interpretation: NOT DETECTED
C Diff toxin: NEGATIVE

## 2020-04-14 LAB — LACTIC ACID, PLASMA: Lactic Acid, Venous: 2.1 mmol/L (ref 0.5–1.9)

## 2020-04-14 LAB — FERRITIN: Ferritin: 796 ng/mL — ABNORMAL HIGH (ref 24–336)

## 2020-04-14 LAB — GLUCOSE, CAPILLARY
Glucose-Capillary: 156 mg/dL — ABNORMAL HIGH (ref 70–99)
Glucose-Capillary: 123 mg/dL — ABNORMAL HIGH (ref 70–99)
Glucose-Capillary: 157 mg/dL — ABNORMAL HIGH (ref 70–99)
Glucose-Capillary: 106 mg/dL — ABNORMAL HIGH (ref 70–99)

## 2020-04-14 LAB — VITAMIN B12: Vitamin B-12: 1492 pg/mL — ABNORMAL HIGH (ref 180–914)

## 2020-04-14 LAB — FOLATE: Folate: 44 ng/mL (ref >5.9)

## 2020-04-14 LAB — PROCALCITONIN: Procalcitonin: 21.2 ng/mL

## 2020-04-14 MED ORDER — VANCOMYCIN HCL 2000 MG/400ML IV SOLN
2000.0000 mg | Freq: Once | INTRAVENOUS | Status: AC
  Administered 2020-04-14: 2000 mg via INTRAVENOUS
  Filled 2020-04-14: qty 400

## 2020-04-14 NOTE — Progress Notes (Signed)
[place], [place] 04/14/20  Subjective:   LOS: 2 Thomas [name] is a 79 y.o. male with medical history of end-stage renal disease on peritoneal dialysis, COPD, diverticular disease, hypertension, COVID-19 infection in January 2021, hyperlipidemia, hypothyroidism, nasal mass status post removal, and obstructive sleep apnea oxygen dependent with facemask who presents to ED for altered mental status.  Nephrology was referred for recommendations of ESRD on PD.  Patient resting in bed, appears uncomfortable with abdominal distension and pain. He reports anorexia and not eating well. He received peritoneal dialysis treatment last night, but was having issues draining the fluid.    Objective:  Vital signs in last 24 hours:  Temp:  [97.9 F (36.6 C)-98.8 F (37.1 C)] 98.6 F (37 C) (11/11 1000) Pulse Rate:  [87-110] 87 (11/11 1000) Resp:  [16-32] 16 (11/11 1000) BP: (127-181)/(47-159) 130/58 (11/11 1000) SpO2:  [90 %-100 %] 93 % (11/11 1000) Weight:  [96.9 kg] 96.9 kg (11/11 0500)  Weight change: -5.84 kg Filed Weights   04/12/20 2206 04/14/20 0500  Weight: 102.7 kg 96.9 kg    Intake/Output:    Intake/Output Summary (Last 24 hours) at 04/14/2020 1204 Last data filed at 04/14/2020 0200 Gross per 24 hour  Intake 50 ml  Output --  Net 50 ml     Physical Exam: General: Appears uncomfortable  HEENT Rhinectomy, turbinates visible and pink  Pulm/lungs Normal respiratory effort, fine crackle at the bases  CVS/Heart S1S2, no rubs or gallops  Abdomen:  Distended + + , abdominal tenderness+  Extremities: Trace peripheral edema  Neurologic: Awake and alert, able to answer simple questions appropriately  Skin: No rashes or lesions noted  Access: Peritoneal catheter with dressing clean,dry and intact       Basic Metabolic Panel:  Recent Labs  Lab 04/12/20 2133 04/13/20 0119 04/13/20 0300 04/14/20 0416  NA 134*  --  132* 132*  K 4.3  --  4.3  3.7  CL 89*  --  92* 88*  CO2 26  --  22 28  GLUCOSE 154*  --  165* 211*  BUN 59*  --  60* 62*  CREATININE 9.72*  --  9.57* 9.42*  CALCIUM 9.0  --  8.3* 8.3*  MG  --  1.7  --   --   PHOS  --   --   --  4.9*     CBC: Recent Labs  Lab 04/12/20 2133 04/13/20 0300 04/14/20 0416  WBC 14.2* 17.5* 15.8*  NEUTROABS 13.0*  --  13.3*  HGB 11.0* 10.0* 10.6*  HCT 33.7* 31.3* 33.3*  MCV 83.6 84.1 84.1  PLT 297 266 264      Lab Results  Component Value Date   HEPBSAG Negative 01/25/2019      Microbiology:  Recent Results (from the past 240 hour(s))  Culture, blood (routine x 2)     Status: None (Preliminary result)   Collection Time: 04/12/20  9:33 PM   Specimen: BLOOD  Result Value Ref Range Status   Specimen Description BLOOD RIGHT ASSIST CONTROL  Final   Special Requests   Final    BOTTLES DRAWN AEROBIC AND ANAEROBIC Blood Culture results may not be optimal due to an inadequate volume of blood received in culture bottles   Culture   Final    NO GROWTH 2 DAYS Performed at [facility], [address], [place], [place] [address]    Report Status PENDING  Incomplete  Respiratory Panel by RT PCR (Flu  A&B, Covid) - Nasopharyngeal Swab     Status: None   Collection Time: 04/12/20  9:38 PM   Specimen: Nasopharyngeal Swab  Result Value Ref Range Status   SARS Coronavirus 2 by RT PCR NEGATIVE NEGATIVE Final    Comment: (NOTE) SARS-CoV-2 target nucleic acids are NOT DETECTED.  The SARS-CoV-2 RNA is generally detectable in upper respiratoy specimens during the acute phase of infection. The lowest concentration of SARS-CoV-2 viral copies this assay can detect is 131 copies/mL. A negative result does not preclude SARS-Cov-2 infection and should not be used as the sole basis for treatment or other patient management decisions. A negative result may occur with  improper specimen collection/handling, submission of specimen other than nasopharyngeal swab, presence of  viral mutation(s) within the areas targeted by this assay, and inadequate number of viral copies (<131 copies/mL). A negative result must be combined with clinical observations, patient history, and epidemiological information. The expected result is Negative.  Fact Sheet for Patients:  PinkCheek.be  Fact Sheet for Healthcare Providers:  GravelBags.it  This test is no t yet approved or cleared by the [place] FDA and  has been authorized for detection and/or diagnosis of SARS-CoV-2 by FDA under an Emergency Use Authorization (EUA). This EUA will remain  in effect (meaning this test can be used) for the duration of the COVID-19 declaration under Section 564(b)(1) of the Act, 21 U.S.C. section 360bbb-3(b)(1), unless the authorization is terminated or revoked sooner.     Influenza A by PCR NEGATIVE NEGATIVE Final   Influenza B by PCR NEGATIVE NEGATIVE Final    Comment: (NOTE) The Xpert Xpress SARS-CoV-2/FLU/RSV assay is intended as an aid in  the diagnosis of influenza from Nasopharyngeal swab specimens and  should not be used as a sole basis for treatment. Nasal washings and  aspirates are unacceptable for Xpert Xpress SARS-CoV-2/FLU/RSV  testing.  Fact Sheet for Patients: PinkCheek.be  Fact Sheet for Healthcare Providers: GravelBags.it  This test is not yet approved or cleared by the [place] FDA and  has been authorized for detection and/or diagnosis of SARS-CoV-2 by  FDA under an Emergency Use Authorization (EUA). This EUA will remain  in effect (meaning this test can be used) for the duration of the  Covid-19 declaration under Section 564(b)(1) of the Act, 21  U.S.C. section 360bbb-3(b)(1), unless the authorization is  terminated or revoked. Performed at [facility], [address] [place], [place] [address]   Peritoneal Fluid culture  ( includes gram stain)     Status: None (Preliminary result)   Collection Time: 04/12/20 10:28 PM   Specimen: Peritoneal Washings; Peritoneal Fluid  Result Value Ref Range Status   Specimen Description   Final    PERITONEAL Performed at [facility], [address] [place], [place] [address]    Special Requests   Final    NONE Performed at [facility], [address] [place], [place] [address]    Gram Stain   Final    ABUNDANT WBC PRESENT, PREDOMINANTLY PMN NO ORGANISMS SEEN    Culture   Final    NO GROWTH 1 DAY Performed at [facility] [address], [place], [place] [address]    Report Status PENDING  Incomplete  Culture, blood (Routine X 2) w Reflex to ID Panel     Status: None (Preliminary result)   Collection Time: 04/13/20  6:40 PM   Specimen: BLOOD  Result Value Ref Range Status   Specimen Description  BLOOD RIGHT ANTECUBITAL  Final   Special Requests   Final    BOTTLES DRAWN AEROBIC AND ANAEROBIC Blood Culture adequate volume   Culture   Final    NO GROWTH < 12 HOURS Performed at [facility], [address] [place], [place] [address]    Report Status PENDING  Incomplete    Coagulation Studies: Recent Labs    04/13/20 0300  LABPROT 13.9  INR 1.1    Urinalysis: No results for input(s): COLORURINE, LABSPEC, PHURINE, GLUCOSEU, HGBUR, BILIRUBINUR, KETONESUR, PROTEINUR, UROBILINOGEN, NITRITE, LEUKOCYTESUR in the last 72 hours.  Invalid input(s): APPERANCEUR    Imaging: CT Abdomen Pelvis Wo Contrast  Result Date: 04/12/2020 CLINICAL DATA:  79 year old male with abdominal pain. EXAM: CT ABDOMEN AND PELVIS WITHOUT CONTRAST TECHNIQUE: Multidetector CT imaging of the abdomen and pelvis was performed following the standard protocol without IV contrast. COMPARISON:  CT abdomen pelvis dated 01/20/2020. FINDINGS: Evaluation of this exam is limited in the absence of intravenous contrast. Lower chest: Partially visualized small  left pleural effusion with compressive atelectasis of the majority of the visualized left lower lobe versus pneumonia. Clusters of airspace density involving the right lung base noted which may represent atelectasis but concerning for pneumonia. Clinical correlation is recommended. There is 3 vessel coronary vascular calcification. There is pneumoperitoneum with pockets of air primarily in the upper abdomen anteriorly. The pneumoperitoneum is new since the prior CT and although may have been introduced via the peritoneal dialysis catheter, the possibility of bowel perforation is not excluded. Clinical correlation is recommended. There is a small ascites, new since the prior CT. Hepatobiliary: Probable early changes of cirrhosis. There is slight scalloping of the dome of the liver. No intrahepatic biliary dilatation. Cholecystectomy. Pancreas: Unremarkable. No pancreatic ductal dilatation or surrounding inflammatory changes. Spleen: Normal in size without focal abnormality. Adrenals/Urinary Tract: The adrenal glands unremarkable. Moderate bilateral renal parenchyma atrophy. There is a 3.5 cm right renal upper pole cyst with areas of peripheral calcification. This is similar to prior CT. Several additional smaller renal lesions noted which are not characterized on this noncontrast CT. There is no hydronephrosis on either side. The visualized ureters and urinary bladder appear unremarkable. Stomach/Bowel: There is sigmoid diverticulosis with muscular hypertrophy. Evaluation for possible active inflammation is limited due to ascites. There is no bowel obstruction. The appendix is not visualized due to ascites. Vascular/Lymphatic: Advanced aortoiliac atherosclerotic disease. The IVC is unremarkable. No portal venous gas. There is no adenopathy. Reproductive: The prostate and seminal vesicles are grossly unremarkable. No pelvic mass. Other: Small fat containing umbilical hernia. A peritoneal dialysis catheter is noted  with tip in the right hemipelvis in similar position. Musculoskeletal: Degenerative changes of the spine. No acute osseous pathology. IMPRESSION: 1. Pneumoperitoneum, new since the prior CT. Although this may have been introduced via the peritoneal dialysis catheter, the possibility of bowel perforation is not excluded. Clinical correlation is recommended. 2. Sigmoid diverticulosis.  No bowel obstruction. 3. Early changes of cirrhosis. 4. Small ascites, new since the prior CT. Peritoneal dialysis catheter in similar position as the prior CT. 5. Partially visualized small left pleural effusion with left lower lobe atelectasis versus pneumonia. Clusters of airspace density involving the right lung base may represent atelectasis but concerning for pneumonia. Clinical correlation is recommended. 6. Aortic Atherosclerosis (ICD10-I70.0). These results were called by telephone at the time of interpretation on 04/12/2020 at 11:18 pm to provider [facility] , who verbally acknowledged these results. Electronically Signed   By: Anner Crete  M.D.   On: 04/12/2020 23:23   DG Chest 2 View  Result Date: 04/12/2020 CLINICAL DATA:  Dyspnea, fever EXAM: CHEST - 2 VIEW COMPARISON:  01/20/2020 FINDINGS: Lung volumes are small with asymmetric left-sided volume loss again noted. There has developed focal pulmonary infiltrate within the lingula and posterior segment of the left lower lobe, likely infectious in the appropriate clinical setting. Small left pleural effusion is present, possibly representing a parapneumonic effusion. No pneumothorax. Cardiac size within normal limits. No acute bone abnormality. IMPRESSION: Multifocal left lung infiltrate, likely infectious, with small associated parapneumonic effusion. Electronically Signed   By: Fidela Salisbury MD   On: 04/12/2020 23:05   CT Head Wo Contrast  Result Date: 04/12/2020 CLINICAL DATA:  Altered level of consciousness, fell out of bed EXAM: CT HEAD WITHOUT CONTRAST  TECHNIQUE: Contiguous axial images were obtained from the base of the skull through the vertex without intravenous contrast. 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 No acute infarct or hemorrhage. Lateral ventricles and midline structures are unremarkable. No new extra-axial fluid collections. No mass effect. Vascular: No hyperdense vessel or unexpected calcification. Skull: Normal. Negative for fracture or focal lesion. Sinuses/Orbits: Extensive sinonasal postsurgical changes again noted, incompletely evaluated. Paranasal sinuses are clear. Other: None. IMPRESSION: 1. No acute infarct or hemorrhage. 2. Decreased size of the right frontal subdural hematoma now measuring up to 9 mm in thickness, previously 22 mm. No mass effect. Electronically Signed   By: Randa Ngo M.D.   On: 04/12/2020 23:04   CT Cervical Spine Wo Contrast  Result Date: 04/12/2020 CLINICAL DATA:  Fall, neck trauma EXAM: CT CERVICAL SPINE WITHOUT CONTRAST TECHNIQUE: Multidetector CT imaging of the cervical spine was performed without intravenous contrast. Multiplanar CT image reconstructions were also generated. COMPARISON:  None. FINDINGS: The examination is is slightly limited by motion artifact. Alignment: There is straightening of the cervical spine, possibly positional in nature or related to underlying muscular spasm. No listhesis. Skull base and vertebrae: The craniocervical junction is unremarkable. The atlantodental interval is normal. No acute fracture of the cervical spine. No lytic or blastic bone lesion. Soft tissues and spinal canal: No prevertebral fluid or swelling. No visible canal hematoma. Disc levels: Review of the sagittal reformats demonstrates preservation of vertebral body heights. There is intervertebral disc space narrowing and endplate  remodeling of C5-T1, most severe at C5-6 and C6-7 in keeping with changes of moderate to severe degenerative disc disease. The prevertebral soft tissues are not thickened. The spinal canal is narrowed with posteriorly oriented disc osteophytes at C5-6 and C6-7 resulting in moderate central canal stenosis. Review of the axial images demonstrates multilevel uncovertebral arthrosis resulting in multilevel mild-to-moderate neural foraminal narrowing, seen bilaterally at C3-4, on the right at C5-6, and bilaterally, left greater than right, at C6-7. Posterior disc osteophyte complex at C5-6 and C6-7 results in moderate central canal stenosis with mild flattening of the thecal sac. Upper chest: Left pleural effusion is noted, partially visualized. Other: None significant IMPRESSION: No acute fracture of the cervical spine. Multilevel degenerative disc and degenerative joint disease resulting in multilevel neural foraminal narrowing and moderate central canal stenosis as described above. Left pleural effusion noted. Electronically Signed   By: Fidela Salisbury MD   On: 04/12/2020 23:12     Medications:   . dialysis solution 2.5% low-MG/low-CA Stopped (04/13/20 1638)  .  piperacillin-tazobactam (ZOSYN)  IV 2.25 g (04/14/20 0556)  . vancomycin 2,000 mg (04/14/20 1006)   . albuterol  2 mg Oral BID  . calcium acetate  1,334 mg Oral TID WC  . calcium-vitamin D   Oral Daily  . cholecalciferol  2,000 Units Oral BID  . epoetin (EPOGEN/PROCRIT) injection  10,000 Units Subcutaneous Weekly  . extraneal (ICODEXTRIN) peritoneal dialysis solution   Intraperitoneal Q24H  . fluticasone furoate-vilanterol  1 puff Inhalation Daily   And  . umeclidinium bromide  1 puff Inhalation Daily  . gentamicin cream  1 application Topical Daily  . heparin injection (subcutaneous)  5,000 Units Subcutaneous Q8H  . insulin aspart  0-5 Units Subcutaneous QHS  . insulin aspart  0-6 Units Subcutaneous TID WC  . insulin glargine  10 Units  Subcutaneous QHS  . isosorbide mononitrate  30 mg Oral Daily  . levalbuterol  0.63 mg Nebulization Q8H  . multivitamin  1 tablet Oral QHS  . pantoprazole  40 mg Oral Daily   acetaminophen **OR** acetaminophen, albuterol, calcium acetate **AND** calcium acetate, fentaNYL (SUBLIMAZE) injection, heparin, hydrALAZINE, ondansetron **OR** ondansetron (ZOFRAN) IV  Assessment/ Plan:  79 y.o. male with ESRD on PD, COPD, Diverticular disease, HTN, Covid 19 infection in Jan 2021, HLD, hypothyroidism, nasal mass removal, h/o Sub dural hematoma, OSA was admitted on 04/12/2020 for  Principal Problem:   Sepsis [facility]) Active Problems:   Coronary atherosclerosis   Esophageal reflux   End stage renal disease (HCC)   COPD (chronic obstructive pulmonary disease) ([place])   Spontaneous bacterial peritonitis ([place])  Peritonitis associated with peritoneal dialysis, initial encounter ([place]) [T85.71XA] Sepsis ([place]) [A41.9] Altered mental status, unspecified altered mental status type [R41.82] Sepsis without acute organ dysfunction, due to unspecified organism ([place]) [A41.9]  #. ESRD Outpatient PD regimen is 5 cycles of 2800 + Last Fill of 2000cc Patient received Peritoneal dialysis  treatment last night There was issues with malfunctioning of the machine with alarms going off Plan  for PD again tonight  # Acute Peritonitis Receiving Vancomycin and Zocyn  #. Anemia of CKD  Lab Results  Component Value Date   HGB 10.6 (L) 04/14/2020   Patient is on 10,000 units of Epogen weekly  #. Secondary hyperparathyroidism of renal origin N 25.81      Component Value Date/Time   PTH 218 (H) 01/26/2019 1228   Lab Results  Component Value Date   PHOS 4.9 (H) 04/14/2020  Calcium 8.3 Continue Calcium Acetate, Calcium and Vitamin D supplements We will continue monitoring bone mineral metabolism parameters  #. Diabetes type 2 with CKD Hgb A1c MFr Bld (%)  Date Value  04/13/2020 7.5 (H)  Blood glucose readings  above goal Receiving Insulin Aspart and Insulin Glargine  #Hyponatremia Sodium 132 We will continue monitoring closely   LOS: 2 Golden Gilreath 11/11/202112:04 PM  [place], [place]

## 2020-04-14 NOTE — Evaluation (Addendum)
Clinical/Bedside Swallow Evaluation Patient Details  Name: Thomas [name] MRN: [id number] Date of Birth: 06/25/1940  Today's Date: 04/14/2020 Time: SLP Start Time (ACUTE ONLY): 1140 SLP Stop Time (ACUTE ONLY): 1250 SLP Time Calculation (min) (ACUTE ONLY): 70 min  Past Medical History:  Past Medical History:  Diagnosis Date  . Anemia   . Arthritis    hands, back  . Cancer (HCC)    nose  . Cardiac arrest ([place])   . Cataract   . CHF (congestive heart failure) ([place])   . COPD (chronic obstructive pulmonary disease) ([place])   . Coronary artery disease   . Diabetes mellitus ([place])    Type II  . Diverticulosis 2013   tcs by RMR  . Diverticulosis   . Erosive esophagitis   . ESRD on hemodialysis (HCC)    CKD, hemodialysis Tues/Thurs/Sat  . Family history of adverse reaction to anesthesia    Daughter slow to awaken and extreme nausea  . GERD (gastroesophageal reflux disease)   . Headache   . Hiatal hernia    pt not aware of this  . HTN (hypertension)   . Hypercholesterolemia   . Hypothyroidism   . Irritable bowel syndrome (IBS)   . Jerking    hands or feet- 01/2019- has decreased  . Lymphocytic colitis 2013   tcs by RMR  . Myocardial infarction ([place])    X2  . Nasal mass   . Neuropathy   . Pneumonia   . Schatzki's ring   . SDH (subdural hematoma) ([place])    02/2018, conservative management  . Shortness of breath   . Sleep apnea    uses O2 only  . Supplemental oxygen dependent    At HS and PRN during the day  . Tobacco abuse   . Wears dentures   . Wears glasses    Past Surgical History:  Past Surgical History:  Procedure Laterality Date  . A/V FISTULAGRAM N/A 04/14/2019   Procedure: A/V FISTULAGRAM - Left Arm;  Surgeon: [name], MD;  Location: [facility];  Service: Cardiovascular;  Laterality: N/A;  . AV FISTULA PLACEMENT Left 06/29/2013   Procedure: LEFT RADIAL/CEPHALIC FISTULA;  Surgeon: Conrad [place], MD;  Location: [facility];  Service: Vascular;   Laterality: Left;  . BASCILIC VEIN TRANSPOSITION Left 01/21/2019   Procedure: left arm Bascilic Vein Transposition;  Surgeon: Waynetta Sandy, MD;  Location: [facility];  Service: Vascular;  Laterality: Left;  . cardiac stents     5  . CATARACT EXTRACTION W/PHACO Left 05/17/2014   Procedure: CATARACT EXTRACTION PHACO AND INTRAOCULAR LENS PLACEMENT (IOC);  Surgeon: Tonny Branch, MD;  Location: [facility];  Service: Ophthalmology;  Laterality: Left;  CDE:7.71  . CATARACT EXTRACTION W/PHACO Right 06/14/2014   Procedure: CATARACT EXTRACTION PHACO AND INTRAOCULAR LENS PLACEMENT RIGHT EYE;  Surgeon: Tonny Branch, MD;  Location: [facility];  Service: Ophthalmology;  Laterality: Right;  CDE:8.22  . CHOLECYSTECTOMY    . COLONOSCOPY  December 2007   adenomatous polyps, sigmoid diverticula  . COLONOSCOPY  02/06/2012   Dr. Gala Romney- colonic diverticulosis, bx consistent with lymphocytic colitis  . CORONARY ANGIOPLASTY     x5 stents  . ESOPHAGOGASTRODUODENOSCOPY  May 2009   RMR: non-critical Schatzki's ring, sp dilation with 7 F Maloney, small inlet patch, mild erosive relufx esophagitis  . ESOPHAGOGASTRODUODENOSCOPY N/A 07/15/2012   Dr. Gala Romney- hiatal hernia, duodenal ulcer with surrounding erosions. this may well be related to the "haziness" sen around the head of the pancreas and  duodenum on recent noncontrast ct scan and may have much to do with his abdominal pain.  Marland Kitchen EXCHANGE OF A DIALYSIS CATHETER Right 01/21/2019   Procedure: EXCHANGE OF A DIALYSIS CATHETER, right internal jugular;  Surgeon: Waynetta Sandy, MD;  Location: [facility];  Service: Vascular;  Laterality: Right;  . EXCISION NASAL MASS N/A 07/18/2015   Procedure: EXCISION OF NASAL MASS;  Surgeon: Leta Baptist, MD;  Location: [facility];  Service: ENT;  Laterality: N/A;  . INSERTION OF DIALYSIS CATHETER Right 05/21/2013   Procedure: INSERTION OF DIALYSIS CATHETER;  Surgeon: Conrad Swain, MD;  Location: [facility];  Service: Vascular;  Laterality: Right;  . IR  DIALY SHUNT INTRO [place] W/IMG LEFT Left 10/29/2018  . IR FLUORO GUIDE CV LINE RIGHT  11/05/2018  . IR FLUORO GUIDE CV LINE RIGHT  02/04/2019  . IR REMOVAL TUN CV CATH W/O FL  04/23/2019  . IR US GUIDE VASC ACCESS LEFT  10/29/2018  . IR US GUIDE VASC ACCESS RIGHT  11/05/2018  . LIGATION OF COMPETING BRANCHES OF ARTERIOVENOUS FISTULA Left 11/27/2018   Procedure: Ligation Of Competing Branches Of Arteriovenous Fistula;  Surgeon: [name], MD;  [facility];  Service: Vascular;  Laterality: Left;  Marland Kitchen MULTIPLE TOOTH EXTRACTIONS    . REVISION OF ARTERIOVENOUS GORETEX GRAFT Left 11/27/2018   Procedure: Revision of Left Arm Arteriovenious Fistula;  Surgeon: [name], MD;  Location: [facility] OR;  Service: Vascular;  Laterality: Left;  . THROMBECTOMY W/ EMBOLECTOMY Left 11/27/2018   Procedure: Thrombectomy Arteriovenous Fistula;  Surgeon: [name], MD;  Location: [facility] OR;  Service: Vascular;  Laterality: Left;  Marland Kitchen VASECTOMY     HPI:  Pt  is a 79 y.o. male with medical history significant of end-stage renal disease on peritoneal dialysis, COPD, diverticular disease, hypertension, COVID-19 infection in January of this year, hyperlipidemia, hypothyroidism, nasal mass status post removal, history of subdural hematoma and obstructive sleep apnea oxygen dependent with facemask who was brought in with abdominal pain fever and chills.  Patient apparently had some issues with his peritoneal dialysis fluid and nonsterile procedure and exchange of fluid was performed few days ago.  Started having significant abdominal pain altered mental status and fever.  Brought to the ER where he was seen and evaluated.  Patient has diffuse abdominal tenderness with rebound consistent with peritonitis.  Aspiration of his peritoneal fluid showed cloudy fluid which has been sent to the lab.  Awaiting initial reports.  He has been initiated on antibiotics with suspected spontaneous bacterial peritonitis.  Patient  also meets sepsis criteria by this definition.  We will admit him to our service for continued treatment.  No nausea vomiting or diarrhea.  He is slightly drowsy at the moment.   CXR (11/09): Multifocal left lung infiltrate, likely infectious, with small associated parapneumonic effusion.  Head CT (11/09): No acute infarct or hemorrhage.; Decreased size of the right frontal subdural hematoma now measuring up to 9 mm in thickness, previously 22 mm. No mass effect.  Assessment / Plan / Recommendation Clinical Impression  Pt was seen bedside for Clinical swallow evaluation. Overt clinical s/s of aspiration were noted w/ thin liquids; recommend Dysphagia 2 diet w/ Nectar-thick liquids for safer swallowing and reduced risk for aspiration. CXR (11/09): "Multifocal left lung infiltrate, likely infectious, with small associated parapneumonic effusion." Head CT (11/09): "No acute infarct or hemorrhage.; Decreased size of the right frontal subdural hematoma now measuring up to 9 mm in thickness, previously 22  mm. No mass effect." Pt was awake & Oriented xName upon clinician arrival-- unable to provide accurate birth-date, address, age, or reason for admit. Pt did, however state that he is from "[place]". S/s of confusion/AMS were noted throughout evaluation, noted by conversational perseveration, and increased need for verbal-tactile cues for follow through with tasks. Pt able to follow simple one-step commands w/ mod verbal cues. Unsure of pt cognitive baseline.  Oral Mech Exam revealed pt is edentulous; per pt, one tooth is "coming in" anteriorly, which pt reports is painful. Clinicians recommended pt f/u w/ dentist for dentition concerns. Pt reports he does not wear dentures. Hard & soft palate in-tact-- of note pt is "nasal mass status post removal". Lingual strength & ROM WFL; labial seal grossly WFL. During PO trials of ice chips via spoon, nectar thick liquids 10x via spoon & straw, puree 10x via spoon, and  softened solids 6x via spoon, no Overt Clinical s/s of aspiration were noted. Overt clinical s/s of aspiration noted w/ trials of thin liquids via straw x2; discordination noted. Pt needed hand-over-hand for self-feeding, d/t noted apparent tremor/jerk of hand/arm UE. Pt required mod-max assist for upright positioning in bed; verbal cues throughout session. Oral Phase grossly WFL: mashing/gumming of soft solid trials grossly adequate (min delayed d/t edentulous status), no oral residue. Min anterior spillage noted w/ nectar thick consistency. Oral prep mod impaired: Moderate verbal & tactile cues were needed during PO's for follow-through w/ task-- pt exhibiting min-mod decreased awareness of bolus (keeping mouth open after utensil is at mouth)-- remediated w mod verbal-tactile cues. Most cues necessary during oral prep phase. Speech was clear between all above PO's. No noted increase in WOB/SOB.   Of note, during the event w/ trials of thin liquids, pt exhibited immediate subsequent cough, increased respiratory effort, red-face presentation, and regurgitation. Per pt, this "has been happening at home" w/ thin liquids. Pt exhibited moderate belching/burping throughout after this event. Aspiration suspected-- discontinued PO trials of thin liquid for pt safety after event.    Recommend Dysphagia 2 diet w/ Nectar Thick liquid for safer swallowing; pills whole in puree. Straws OK w/ nectar; monitor use for s/s of aspiration. Recommend following strict aspiration precautions, emphasis on: sitting upright in bed, slow rate of drinking/eating, small bites for increased bolus management/mastication, moistening solid foods, and alternating solids/liquids for complete clearance of bolus. Recommend full supervision at mealtime to cue for compensatory strategies. NSG/MD updated. Education provided. ST services to f/u toleration of diet and trials to upgrade diet if appropriate during admit. Education w/ family when  present. SLP Visit Diagnosis: Dysphagia, oropharyngeal phase (R13.12)       Aspiration Risk  Moderate aspiration risk    Diet Recommendation   Recommend Dysphagia 2 diet w/ Nectar consistency liquids; aspiration precautions; supervision at all meals  Medication Administration: Whole meds with puree    Other  Recommendations Oral Care Recommendations: Oral care BID;Oral care before and after PO Other Recommendations: Order thickener from pharmacy;Remove water pitcher   Follow up Recommendations Home health SLP;Outpatient SLP;Skilled Nursing facility      Frequency and Duration min 1 x/week  2 weeks       Prognosis Prognosis for Safe Diet Advancement: Guarded Barriers to Reach Goals: Cognitive deficits;Severity of deficits      Swallow Study   General Date of Onset: 04/12/20 HPI: Pt  is a 79 y.o. male with medical history significant of end-stage renal disease on peritoneal dialysis, COPD, diverticular disease, hypertension, COVID-19 infection in  January of this year, hyperlipidemia, hypothyroidism, nasal mass status post removal, history of subdural hematoma and obstructive sleep apnea oxygen dependent with facemask who was brought in with abdominal pain fever and chills.  Patient apparently had some issues with his peritoneal dialysis fluid and nonsterile procedure and exchange of fluid was performed few days ago.  Started having significant abdominal pain altered mental status and fever.  Brought to the ER where he was seen and evaluated.  Patient has diffuse abdominal tenderness with rebound consistent with peritonitis.  Aspiration of his peritoneal fluid showed cloudy fluid which has been sent to the lab.  Awaiting initial reports.  He has been initiated on antibiotics with suspected spontaneous bacterial peritonitis.  Patient also meets sepsis criteria by this definition.  We will admit him to our service for continued treatment.  No nausea vomiting or diarrhea.  He is slightly  drowsy at the moment Type of Study: Bedside Swallow Evaluation Previous Swallow Assessment: 04/24/2017--eophageal Diet Prior to this Study:  (renal w fluid restriction) Temperature Spikes Noted: Yes Respiratory Status: Room air History of Recent Intubation: No Behavior/Cognition: Alert;Cooperative;Pleasant mood;Confused;Distractible;Requires cueing Oral Cavity Assessment: Within Functional Limits Oral Care Completed by SLP: Yes Oral Cavity - Dentition: Edentulous Vision: Functional for self-feeding Self-Feeding Abilities: Needs assist;Needs set up Patient Positioning: Upright in bed Baseline Vocal Quality: Normal (dysarthric possibly d/t edentulous condition)    Oral/Motor/Sensory Function Overall Oral Motor/Sensory Function: Within functional limits   Ice Chips Ice chips: Within functional limits Presentation: Spoon Other Comments: 10x   Thin Liquid Thin Liquid: Impaired Presentation: Straw Oral Phase Impairments: Reduced labial seal Pharyngeal  Phase Impairments: Throat Clearing - Immediate;Cough - Immediate (red face presentation) Other Comments: 1x    Nectar Thick Nectar Thick Liquid: Within functional limits Presentation: Spoon;Straw Other Comments: 10x   Honey Thick     Puree Puree: Within functional limits Presentation: Spoon Other Comments: 10x   Solid     Solid: Within functional limits Presentation: Spoon Other Comments: softened-6x      Quintella Baton  Graduate Clinician 04/14/2020,2:35 PM  The information in this patient note, response to treatment, and overall treatment plan developed has been reviewed and agreed upon after reviewing documentation. This session was performed under the supervision of this licensed clinician.   Orinda Kenner, MS, [place] Speech Language Pathologist Rehab Services [phone]

## 2020-04-14 NOTE — Consult Note (Signed)
Consultation Note Date: 04/14/2020   Patient Name: Thomas [name]  DOB: [birth year]  MRN: [id number]  Age / Sex: 79 y.o., male  PCP: Medicine, [facility] Internal Referring Physician: Alma Friendly, MD  Reason for Consultation: Establishing goals of care  HPI/Patient Profile: 79 y.o. 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   Patient apparently had some issues with his peritoneal dialysis fluid and nonsterile procedure and exchange of fluid was performed few days ago. Patient being treated for severe sepsis likely secondary to peritonitis associated with PD. Also requiring increase in oxygen. Complications with PD during hospitalization. PMT consulted for [place].   Clinical Assessment and Goals of Care: I have reviewed medical records including EPIC notes, labs and imaging, received report from RN, assessed the patient and then met with patient and daughter Thomas [name] to discuss diagnosis prognosis, GOC, EOL wishes, disposition and options.  I first spoke with patient and attempted to assess GOC however he was unable to appropriately participate in conversation d/t AMS - continued to repeat the same phrase about wanting to stay at this hospital. Could not engage in further conversation.  I then called patient's daughter Thomas [name] - she tells me she is his caregiver as well as HCPOA, tells me she has documentation.   I introduced Palliative Medicine as specialized medical care for people living with serious illness. It focuses on providing relief from the symptoms and stress of a serious illness. The goal is to improve quality of life for both the patient and the family.  As far as functional and nutritional status. Thomas [name] tells me patient had been doing okay at home - could ambulate with  walker. Needed some assistance with ADLs. Tells me he was usually oriented. - some mild confusion. Poor appetite but she feels this was related to some medication changes.   She further tells me patient was on HD for 6 years; however, this became very difficult for patient - "too aggressive" for him to tolerate; so he transitioned to PD about 2 months ago.   Of note, patient lives with his wife who also has significant health concerns and his daughter and her spouse. They live 1.5 hrs away but come to [facility] d/t ability to do PD for patient.    We discussed patient's current illness and what it means in the larger context of patient's on-going co-morbidities.  Natural disease trajectory and expectations at EOL were discussed.I attempted to elicit values and goals of care important to the patient.    We discussed code status. Encouraged Thomas [name] to consider DNR/DNI status understanding evidenced based poor outcomes in similar hospitalized patients, as the cause of the arrest is likely associated with chronic/terminal disease rather than a reversible acute cardio-pulmonary event. Thomas [name] agrees that DNR is appropriate and in line with the patient's wishes.   Discussed with Thomas [name] the importance of continued conversation with family and the medical providers regarding overall plan of care and treatment options, ensuring decisions are within the context of the patient's values and GOCs.    We also discussed current recommendation for SNF - Thomas [name] unsure if she would want this. She is hopeful to bring him home with home health but wants to see how his function is as we move closer to discharge.  Questions and concerns were addressed. The family was  encouraged to call with questions or concerns.   Primary Decision Maker HCPOA - daughter Thomas [name] - patient with ongoing AMS unable to make decisions independently    SUMMARY OF RECOMMENDATIONS   - code status changed to DNR - will follow for ongoing [place] based  on hospital course  Code Status/Advance Care Planning:  DNR  Prognosis:   Unable to determine  Discharge Planning: To Be Determined      Primary Diagnoses: Present on Admission: . Sepsis ([place]) . COPD (chronic obstructive pulmonary disease) ([place]) . Coronary atherosclerosis . End stage renal disease ([place]) . Esophageal reflux . Spontaneous bacterial peritonitis ([place])   I have reviewed the medical record, interviewed the patient and family, and examined the patient. The following aspects are pertinent.  Past Medical History:  Diagnosis Date  . Anemia   . Arthritis    hands, back  . Cancer (HCC)    nose  . Cardiac arrest ([place])   . Cataract   . CHF (congestive heart failure) ([place])   . COPD (chronic obstructive pulmonary disease) ([place])   . Coronary artery disease   . Diabetes mellitus ([place])    Type II  . Diverticulosis 2013   tcs by RMR  . Diverticulosis   . Erosive esophagitis   . ESRD on hemodialysis (HCC)    CKD, hemodialysis Tues/Thurs/Sat  . Family history of adverse reaction to anesthesia    Daughter slow to awaken and extreme nausea  . GERD (gastroesophageal reflux disease)   . Headache   . Hiatal hernia    pt not aware of this  . HTN (hypertension)   . Hypercholesterolemia   . Hypothyroidism   . Irritable bowel syndrome (IBS)   . Jerking    hands or feet- 01/2019- has decreased  . Lymphocytic colitis 2013   tcs by RMR  . Myocardial infarction ([place])    X2  . Nasal mass   . Neuropathy   . Pneumonia   . Schatzki's ring   . SDH (subdural hematoma) ([place])    02/2018, conservative management  . Shortness of breath   . Sleep apnea    uses O2 only  . Supplemental oxygen dependent    At HS and PRN during the day  . Tobacco abuse   . Wears dentures   . Wears glasses    Social History   Socioeconomic History  . Marital status: Married    Spouse name: Not on file  . Number of children: 3  . Years of education: Not on file  . Highest education  level: Not on file  Occupational History  . 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 Smoking status: Former Smoker    Packs/day: 0.00    Years: 60.00    Pack years: 0.00    Types: E-cigarettes, Cigarettes    Quit date: 11/10/2014    Years since quitting: 5.4  . Smokeless tobacco: Never Used  Vaping Use  . Vaping Use: Never used  Substance and Sexual Activity  . Alcohol use: No  . Drug use: No  . Sexual activity: Not on file  Other Topics Concern  . Not on file  Social History Narrative  . Not on file   Social Determinants of Health   Financial Resource Strain:   . Difficulty of Paying Living Expenses: Not on file  Food Insecurity:   . Worried About Charity fundraiser in the Last Year: Not on file  . Ran Out of Food in the  Last Year: Not on file  Transportation Needs:   . Lack of Transportation (Medical): Not on file  . Lack of Transportation (Non-Medical): Not on file  Physical Activity:   . Days of Exercise per Week: Not on file  . Minutes of Exercise per Session: Not on file  Stress:   . Feeling of Stress : Not on file  Social Connections:   . Frequency of Communication with Friends and Family: Not on file  . Frequency of Social Gatherings with Friends and Family: Not on file  . Attends Religious Services: Not on file  . Active Member of Clubs or Organizations: Not on file  . Attends Archivist Meetings: Not on file  . Marital Status: Not on file   Family History  Problem Relation Age of Onset  . Cancer Mother   . Cancer Father   . Diabetes Brother   . Cancer Brother   . Diabetes Sister   . Cancer Sister   . Cancer Sister   . Colon cancer Neg Hx    Scheduled Meds: . albuterol  2 mg Oral BID  . calcium acetate  1,334 mg Oral TID WC  . calcium-vitamin D   Oral Daily  . cholecalciferol  2,000 Units Oral BID  . epoetin (EPOGEN/PROCRIT) injection  10,000 Units Subcutaneous Weekly  . extraneal (ICODEXTRIN) peritoneal dialysis solution    Intraperitoneal Q24H  . fluticasone furoate-vilanterol  1 puff Inhalation Daily   And  . umeclidinium bromide  1 puff Inhalation Daily  . gentamicin cream  1 application Topical Daily  . heparin injection (subcutaneous)  5,000 Units Subcutaneous Q8H  . insulin aspart  0-5 Units Subcutaneous QHS  . insulin aspart  0-6 Units Subcutaneous TID WC  . insulin glargine  10 Units Subcutaneous QHS  . isosorbide mononitrate  30 mg Oral Daily  . levalbuterol  0.63 mg Nebulization Q8H  . multivitamin  1 tablet Oral QHS  . pantoprazole  40 mg Oral Daily   Continuous Infusions: . dialysis solution 2.5% low-MG/low-CA Stopped (04/13/20 1638)  . piperacillin-tazobactam (ZOSYN)  IV 2.25 g (04/14/20 1458)   PRN Meds:.acetaminophen **OR** acetaminophen, albuterol, calcium acetate **AND** calcium acetate, fentaNYL (SUBLIMAZE) injection, heparin, hydrALAZINE, ondansetron **OR** ondansetron (ZOFRAN) IV Allergies  Allergen Reactions  . Ace Inhibitors Anaphylaxis    Swelling in throat and neck  . Statins Anaphylaxis and Swelling    Caused feet, tongue and throat to swell.  . Codeine Other (See Comments)    Muscle Spasms  . Tape Hives   Review of Systems  Unable to perform ROS: Mental status change    Physical Exam Constitutional:      General: He is not in acute distress.    Comments: lethargic  Pulmonary:     Effort: Pulmonary effort is normal.     Comments: On face mask Abdominal:     General: There is distension.  Skin:    General: Skin is warm and dry.  Neurological:     Mental Status: He is disoriented.     Vital Signs: BP (!) 130/58 (BP Location: Right Wrist)   Pulse 87   Temp 98.6 F (37 C) (Oral)   Resp 16   Ht 5' 9.02" (1.753 m)   Wt 96.9 kg   SpO2 93%   BMI 31.53 kg/m  Pain Scale: 0-10   Pain Score: 0-No pain   SpO2: SpO2: 93 % O2 Device:SpO2: 93 % O2 Flow Rate: .O2 Flow Rate (L/min): 6 L/min  IO: Intake/output  summary:   Intake/Output Summary (Last 24 hours)  at 04/14/2020 1549 Last data filed at 04/14/2020 1500 Gross per 24 hour  Intake 496.48 ml  Output --  Net 496.48 ml    LBM:   Baseline Weight: Weight: 102.7 kg Most recent weight: Weight: 96.9 kg     Palliative Assessment/Data: PPS 40%    Time Total: 40 minutes Greater than 50%  of this time was spent counseling and coordinating care related to the above assessment and plan.  Juel Burrow, DNP, AGNP-C Palliative Medicine Team [phone] Pager: [phone]

## 2020-04-14 NOTE — Progress Notes (Signed)
PROGRESS NOTE  Thomas [name] MVH:[id number] DOB: 1941-01-04 DOA: 04/12/2020 PCP: [facility], [facility]  HPI/Recap of past 24 hours: HPI from Dr [name] is a 79 y.o. male with medical history significant of end-stage renal disease on peritoneal dialysis, COPD, diverticular disease, hypertension, COVID-19 infection in January of this year, hyperlipidemia, hypothyroidism, nasal mass status post nose removal, history of subdural hematoma and obstructive sleep apnea oxygen dependent with facemask who was brought in with abdominal pain fever and chills.  Patient apparently had some issues with his peritoneal dialysis fluid and nonsterile procedure and exchange of fluid was performed few days ago. Started having significant abdominal pain altered mental status and fever.  Patient admitted for further management.  In the ER, patient was noted to have diffuse abdominal tenderness with rebound consistent with peritonitis.  Aspiration of his peritoneal fluid showed cloudy fluid which has been sent to the lab, peritoneal fluid culture and blood cultures have been taken, started on antibiotics with suspected spontaneous bacterial peritonitis.  Noted to have temperature 100.9 blood pressure 166/81 pulse 110 respiratory rate of 22 oxygen sat 90%.  Sodium is 134 potassium 4.3 chloride of 89 CO2 26 glucose 154 BUN 59 creatinine 9.72 and a gap of 19.  Troponin is 24 lactic acid 2.4 white count 14.2 hemoglobin 11.1 platelets of 297.  COVID-19 screen is negative.      Today, patient appeared to be lethargic upon my examination, just recently received fentanyl for abdominal pain, denied any chest pain, worsening shortness of breath, fever/chills.   Assessment/Plan: Principal Problem:   Sepsis ([place]) Active Problems:   Coronary atherosclerosis   Esophageal reflux   End stage renal disease (HCC)   COPD (chronic obstructive pulmonary disease) (HCC)   Spontaneous bacterial peritonitis  (HCC)   Severe sepsis likely 2/2 peritonitis associated with PD On admission, temp 100.9, heart rate 110, respiratory rate 22, WBC 14.2, lactic acid 2.4 with altered mental status Likely complication from peritoneal dialysis Currently afebrile, with leukocytosis Lactic acid, procalcitonin elevated, will trend BC x2 pending Peritoneal fluid analysis culture pending Continue IV Zosyn, added vancomycin Monitor closely  Acute on chronic hypoxic respiratory failure Possible CAP History of COPD Patient noted to be tachypneic, requiring about 6 L of O2 via facemask, 5 L at home Chest x-ray showing multifocal left lung infiltrate, with small associated parapneumonic effusion Continue IV Zosyn, vancomycin for now, duo nebs, inhaler Supplemental O2  ESRD On PD Nephrology consulted  Anemia of chronic kidney disease/iron deficiency Baseline hemoglobin around 12 Anemia panel showed iron 17, sats 12, ferritin 796 On weekly Epogen Daily CBC, monitor for any signs of bleeding  Hypertension Stable Continue home Imdur, hydralazine as needed  Diabetes mellitus type 2 A1c on 04/13/2020 was 7.5 SSI, Accu-Cheks, Lantus, hypoglycemic protocol  GERD Continue PPI  OSA Continue oxygen via facemask  Obesity Lifestyle modification advised  Goals of care discussion Patient with multiple comorbidities, poor prognosis Patient will benefit from palliative consult for further goals of care discussion Palliative consulted        Malnutrition Type:      Malnutrition Characteristics:      Nutrition Interventions:       Estimated body mass index is 31.53 kg/m as calculated from the following:   Height as of this encounter: 5' 9.02" (1.753 m).   Weight as of this encounter: 96.9 kg.     Code Status: Full  Family Communication: Plan to discuss with family  Disposition Plan: Status is: Inpatient  Remains inpatient appropriate because:Inpatient level of care appropriate  due to severity of illness   Dispo: The patient is from: Home              Anticipated d/c is to: SNF              Anticipated d/c date is: 3 days              Patient currently is not medically stable to d/c.    Consultants:  Nephrology  Procedures:  None  Antimicrobials:  IV Zosyn   Vancomycin  DVT prophylaxis: Heparin [place]   Objective: Vitals:   04/14/20 0012 04/14/20 0436 04/14/20 0500 04/14/20 1000  BP:  (!) 132/49  (!) 130/58  Pulse:  87  87  Resp:  16  16  Temp:  97.9 F (36.6 C)  98.6 F (37 C)  TempSrc:    Oral  SpO2: 99% 96%  93%  Weight:   96.9 kg   Height:        Intake/Output Summary (Last 24 hours) at 04/14/2020 1324 Last data filed at 04/14/2020 0200 Gross per 24 hour  Intake 50 ml  Output --  Net 50 ml   Filed Weights   04/12/20 2206 04/14/20 0500  Weight: 102.7 kg 96.9 kg    Exam:  General:  Acutely ill-appearing, absent nose s/p surgery, lethargic  Cardiovascular: S1, S2 present  Respiratory:  Diminished breath sounds bilaterally  Abdomen: Soft, generalized tenderness, distended, bowel sounds present  Musculoskeletal: No bilateral pedal edema noted  Skin: Normal  Psychiatry:  Lethargic   Data Reviewed: CBC: Recent Labs  Lab 04/12/20 2133 04/13/20 0300 04/14/20 0416  WBC 14.2* 17.5* 15.8*  NEUTROABS 13.0*  --  13.3*  HGB 11.0* 10.0* 10.6*  HCT 33.7* 31.3* 33.3*  MCV 83.6 84.1 84.1  PLT 297 266 673   Basic Metabolic Panel: Recent Labs  Lab 04/12/20 2133 04/13/20 0119 04/13/20 0300 04/14/20 0416  NA 134*  --  132* 132*  K 4.3  --  4.3 3.7  CL 89*  --  92* 88*  CO2 26  --  22 28  GLUCOSE 154*  --  165* 211*  BUN 59*  --  60* 62*  CREATININE 9.72*  --  9.57* 9.42*  CALCIUM 9.0  --  8.3* 8.3*  MG  --  1.7  --   --   PHOS  --   --   --  4.9*   GFR: Estimated Creatinine Clearance: 7.3 mL/min (A) (by C-G formula based on SCr of 9.42 mg/dL (H)). Liver Function Tests: Recent Labs  Lab 04/12/20 2133  04/13/20 0300 04/14/20 0416  AST 19 17  --   ALT 15 13  --   ALKPHOS 137* 105  --   BILITOT 0.7 0.9  --   PROT 7.2 6.2*  --   ALBUMIN 2.9* 2.5* 2.5*   No results for input(s): LIPASE, AMYLASE in the last 168 hours. No results for input(s): AMMONIA in the last 168 hours. Coagulation Profile: Recent Labs  Lab 04/13/20 0300  INR 1.1   Cardiac Enzymes: No results for input(s): CKTOTAL, CKMB, CKMBINDEX, TROPONINI in the last 168 hours. BNP (last 3 results) No results for input(s): PROBNP in the last 8760 hours. HbA1C: Recent Labs    04/13/20 0252  HGBA1C 7.5*   CBG: Recent Labs  Lab 04/13/20 1214 04/13/20 1705 04/13/20 2128 04/14/20 0739 04/14/20 1152  GLUCAP 159* 137* 182* 156* 123*   Lipid Profile:  No results for input(s): CHOL, HDL, LDLCALC, TRIG, CHOLHDL, LDLDIRECT in the last 72 hours. Thyroid Function Tests: No results for input(s): TSH, T4TOTAL, FREET4, T3FREE, THYROIDAB in the last 72 hours. Anemia Panel: Recent Labs    04/14/20 0416  VITAMINB12 1,492*  FOLATE 44.0  FERRITIN 796*  TIBC 139*  IRON 17*   Urine analysis:    Component Value Date/Time   COLORURINE YELLOW 06/15/2008 1856   APPEARANCEUR CLEAR 06/15/2008 1856   LABSPEC >1.030 (H) 06/15/2008 1856   PHURINE 5.0 06/15/2008 1856   GLUCOSEU 250 06/29/2012 1308   HGBUR TRACE (A) 06/15/2008 1856   BILIRUBINUR SMALL (A) 06/15/2008 1856   KETONESUR TRACE (A) 06/15/2008 1856   PROTEINUR >300 (A) 06/15/2008 1856   UROBILINOGEN 0.2 06/15/2008 1856   NITRITE NEGATIVE 06/15/2008 1856   LEUKOCYTESUR NEGATIVE 06/15/2008 1856   Sepsis Labs: @LABRCNTIP (procalcitonin:4,lacticidven:4)  ) Recent Results (from the past 240 hour(s))  Culture, blood (routine x 2)     Status: None (Preliminary result)   Collection Time: 04/12/20  9:33 PM   Specimen: BLOOD  Result Value Ref Range Status   Specimen Description BLOOD RIGHT ASSIST CONTROL  Final   Special Requests   Final    BOTTLES DRAWN AEROBIC AND  ANAEROBIC Blood Culture results may not be optimal due to an inadequate volume of blood received in culture bottles   Culture   Final    NO GROWTH 2 DAYS Performed at [facility], [address], [place], [place] [address]    Report Status PENDING  Incomplete  Respiratory Panel by RT PCR (Flu A&B, Covid) - Nasopharyngeal Swab     Status: None   Collection Time: 04/12/20  9:38 PM   Specimen: Nasopharyngeal Swab  Result Value Ref Range Status   SARS Coronavirus 2 by RT PCR NEGATIVE NEGATIVE Final    Comment: (NOTE) SARS-CoV-2 target nucleic acids are NOT DETECTED.  The SARS-CoV-2 RNA is generally detectable in upper respiratoy specimens during the acute phase of infection. The lowest concentration of SARS-CoV-2 viral copies this assay can detect is 131 copies/mL. A negative result does not preclude SARS-Cov-2 infection and should not be used as the sole basis for treatment or other patient management decisions. A negative result may occur with  improper specimen collection/handling, submission of specimen other than nasopharyngeal swab, presence of viral mutation(s) within the areas targeted by this assay, and inadequate number of viral copies (<131 copies/mL). A negative result must be combined with clinical observations, patient history, and epidemiological information. The expected result is Negative.  Fact Sheet for Patients:  PinkCheek.be  Fact Sheet for Healthcare Providers:  GravelBags.it  This test is no t yet approved or cleared by the [place] FDA and  has been authorized for detection and/or diagnosis of SARS-CoV-2 by FDA under an Emergency Use Authorization (EUA). This EUA will remain  in effect (meaning this test can be used) for the duration of the COVID-19 declaration under Section 564(b)(1) of the Act, 21 U.S.C. section 360bbb-3(b)(1), unless the authorization is terminated or revoked  sooner.     Influenza A by PCR NEGATIVE NEGATIVE Final   Influenza B by PCR NEGATIVE NEGATIVE Final    Comment: (NOTE) The Xpert Xpress SARS-CoV-2/FLU/RSV assay is intended as an aid in  the diagnosis of influenza from Nasopharyngeal swab specimens and  should not be used as a sole basis for treatment. Nasal washings and  aspirates are unacceptable for Xpert Xpress SARS-CoV-2/FLU/RSV  testing.  Fact Sheet for Patients: PinkCheek.be  Fact Sheet for Healthcare Providers: GravelBags.it  This test is not yet approved or cleared by the [place] FDA and  has been authorized for detection and/or diagnosis of SARS-CoV-2 by  FDA under an Emergency Use Authorization (EUA). This EUA will remain  in effect (meaning this test can be used) for the duration of the  Covid-19 declaration under Section 564(b)(1) of the Act, 21  U.S.C. section 360bbb-3(b)(1), unless the authorization is  terminated or revoked. Performed at [facility], [address] [place], [place] [address]   Peritoneal Fluid culture ( includes gram stain)     Status: None (Preliminary result)   Collection Time: 04/12/20 10:28 PM   Specimen: Peritoneal Washings; Peritoneal Fluid  Result Value Ref Range Status   Specimen Description   Final    PERITONEAL Performed at [facility], [address] [place], [place] [address]    Special Requests   Final    NONE Performed at [facility], [address] [place], [place] [address]    Gram Stain   Final    ABUNDANT WBC PRESENT, PREDOMINANTLY PMN NO ORGANISMS SEEN    Culture   Final    NO GROWTH 1 DAY Performed at [facility] [address], [place], [place] [address]    Report Status PENDING  Incomplete  Culture, blood (Routine X 2) w Reflex to ID Panel     Status: None (Preliminary result)   Collection Time: 04/13/20  6:40 PM   Specimen: BLOOD  Result Value Ref Range  Status   Specimen Description BLOOD RIGHT ANTECUBITAL  Final   Special Requests   Final    BOTTLES DRAWN AEROBIC AND ANAEROBIC Blood Culture adequate volume   Culture   Final    NO GROWTH < 12 HOURS Performed at [facility], [address], [place],  [address]    Report Status PENDING  Incomplete      Studies: No results found.  Scheduled Meds: . albuterol  2 mg Oral BID  . calcium acetate  1,334 mg Oral TID WC  . calcium-vitamin D   Oral Daily  . cholecalciferol  2,000 Units Oral BID  . epoetin (EPOGEN/PROCRIT) injection  10,000 Units Subcutaneous Weekly  . extraneal (ICODEXTRIN) peritoneal dialysis solution   Intraperitoneal Q24H  . fluticasone furoate-vilanterol  1 puff Inhalation Daily   And  . umeclidinium bromide  1 puff Inhalation Daily  . gentamicin cream  1 application Topical Daily  . heparin injection (subcutaneous)  5,000 Units Subcutaneous Q8H  . insulin aspart  0-5 Units Subcutaneous QHS  . insulin aspart  0-6 Units Subcutaneous TID WC  . insulin glargine  10 Units Subcutaneous QHS  . isosorbide mononitrate  30 mg Oral Daily  . levalbuterol  0.63 mg Nebulization Q8H  . multivitamin  1 tablet Oral QHS  . pantoprazole  40 mg Oral Daily    Continuous Infusions: . dialysis solution 2.5% low-MG/low-CA Stopped (04/13/20 1638)  . piperacillin-tazobactam (ZOSYN)  IV 2.25 g (04/14/20 0556)     LOS: 2 days     Alma Friendly, MD [facility]  If 7PM-7AM, please contact night-coverage [email or web address] 04/14/2020, 1:24 PM

## 2020-04-14 NOTE — Evaluation (Signed)
Occupational Therapy Evaluation Patient Details Name: Thomas [name] MRN: [id number] DOB: July 05, 1940 Today's Date: 04/14/2020    History of Present Illness Pt is a 79yo M admitted to [facility] on 04/12/20 due to AMS, fever, generalized weakness, and diffused low back and abdominal pain (consistent with peritonitis) after being found by family to have fallen out of bed. Upon admission pt was tachycardic and febrile, concerning for sepsis. Significant PMH includes: HTN, diabetes, CAD, ESRD on PD, COPD on 5L via face tent, hypothyroidism, and nasal cancer s/p nose amputation. Per family report, there was an issue with his PD on 04/11/20 where the connection from dialysate to catheter became undone, resulting in significant leakage. Chest x-ray concerning for multifocal PNA, CT head/c-spine negative for acute process, and CT abdomen/pelvis show intraperitoneal free air likely related to recent issue with PD catheter.   Clinical Impression   Pt seen for OT evaluation this date in setting of acute hospitalization. Pt presents somewhat confused and with gross weakness of trunk and limbs as well as abd pain impacting his ability to perform ADLs/ADL mobility. Pt requires MAX A for rolling as well as MOD A for bed level UB ADLs and MAX A to TOTAL A for bed level LB ADLs. Pt requiring extensive assist relative to baseline at this time. Anticipate pt will require SNF upon d/c from acute setting.     Follow Up Recommendations  SNF    Equipment Recommendations  Other (comment) (defer to next level of care)    Recommendations for Other Services       Precautions / Restrictions Precautions Precautions: Fall Precaution Comments: Enteric precautions, fluid restrictions (1252mL) thin liquids Restrictions Weight Bearing Restrictions: No      Mobility Bed Mobility Overal bed mobility: Needs Assistance Bed Mobility: Supine to Sit     Supine to sit: Mod assist;HOB elevated     General bed mobility comments:  NT    Transfers                 General transfer comment: NT    Balance Overall balance assessment: Needs assistance Sitting-balance support: Feet supported;Bilateral upper extremity supported Sitting balance-Leahy Scale: Poor Sitting balance - Comments: NT Postural control: Posterior lean     Standing balance comment: NT                           ADL either performed or assessed with clinical judgement   ADL Overall ADL's : Needs assistance/impaired                                       General ADL Comments: MOD A for bed level UB ADLs, MAX to TOTAL A for LB     Vision Patient Visual Report: No change from baseline       Perception     Praxis      Pertinent Vitals/Pain Pain Assessment: Faces Faces Pain Scale: Hurts even more Pain Location: abdomen Pain Descriptors / Indicators: Aching;Grimacing;Guarding Pain Intervention(s): Limited activity within patient's tolerance;Monitored during session     Hand Dominance Right   Extremity/Trunk Assessment Upper Extremity Assessment Upper Extremity Assessment: Generalized weakness (UE tremors at baseline, grip weak for ADLs)   Lower Extremity Assessment Lower Extremity Assessment: Generalized weakness RLE Deficits / Details: Unable to maintain testing position against gravity RLE Sensation: decreased light touch RLE Coordination: decreased gross motor;decreased fine  motor LLE Deficits / Details: Unable to maintain testing position against gravity LLE Sensation: decreased light touch LLE Coordination: decreased gross motor;decreased fine motor   Cervical / Trunk Assessment Cervical / Trunk Assessment: Kyphotic   Communication Communication Communication: Expressive difficulties;Other (comment)   Cognition Arousal/Alertness: Lethargic;Suspect due to medications Behavior During Therapy: [facility] for tasks assessed/performed Overall Cognitive Status: Impaired/Different from  baseline Area of Impairment: Orientation;Attention;Following commands;Problem solving                 Orientation Level: Disoriented to;Time;Situation;Place Current Attention Level: Alternating   Following Commands: Follows one step commands inconsistently;Follows one step commands with increased time     Problem Solving: Decreased initiation;Slow processing;Difficulty sequencing;Requires verbal cues     General Comments  +4 pitting edema in bil ankles    Exercises Other Exercises Other Exercises: OT educates re: importance of OOB Activity and role of OT. Pt with poor reception for education this date. Other Exercises: Pt only able to tolerated static seated balance at EOB with BUE/BLE support for ~66min. With fatigue, pt demonstrated increased BUE tremoring and intermittent buckling of bil elbows. Required intermittent assistance of min guard - mod assist for maintenance of seated balance at EOB.   Shoulder Instructions      Home Living Family/patient expects to be discharged to:: Private residence Living Arrangements: Children;Spouse/significant other Available Help at Discharge: Family;Available PRN/intermittently Type of Home: House Home Access: Ramped entrance     Home Layout: One level     Bathroom Shower/Tub: Occupational psychologist: Standard     Home Equipment: Environmental consultant - 2 wheels;Wheelchair - Brewing technologist - built in          Prior Functioning/Environment Level of Independence: Needs assistance  Gait / Transfers Assistance Needed: Pt reports being mod I with rollator for transfers and limited community ambulation ADL's / Homemaking Assistance Needed: Pt reports being mod I with ADL's, but requires assistance from family for IADL's and medication management   Comments: Per chart review, pt on 5L O2 via facemask        OT Problem List: Decreased strength;Decreased activity tolerance;Impaired balance (sitting and/or standing);Decreased  coordination;Decreased cognition;Decreased safety awareness      OT Treatment/Interventions: Self-care/ADL training;DME and/or AE instruction;Therapeutic activities;Balance training;Therapeutic exercise;Patient/family education    OT Goals(Current goals can be found in the care plan section) Acute Rehab OT Goals Patient Stated Goal: to be able to walk again OT Goal Formulation: With patient Time For Goal Achievement: 04/28/20 Potential to Achieve Goals: Fair ADL Goals Pt Will Perform Grooming: with supervision;sitting Pt/caregiver will Perform Home Exercise Program: Increased strength;Both right and left upper extremity;With minimal assist Additional ADL Goal #1: Pt will tolerate further mobilization (at least sup<>sit with MAX A and tolerate ~2 mins EOB sitting to improve fxl activity tolerance.  OT Frequency: Min 1X/week   Barriers to D/C:            Co-evaluation              AM-PAC OT "6 Clicks" Daily Activity     Outcome Measure Help from another person eating meals?: A Lot Help from another person taking care of personal grooming?: A Lot Help from another person toileting, which includes using toliet, bedpan, or urinal?: Total Help from another person bathing (including washing, rinsing, drying)?: Total Help from another person to put on and taking off regular upper body clothing?: Total Help from another person to put on and taking off regular lower body clothing?: Total  6 Click Score: 8   End of Session Nurse Communication: Mobility status  Activity Tolerance: Patient limited by pain Patient left: in bed;with call bell/phone within reach;with bed alarm set  OT Visit Diagnosis: Unsteadiness on feet (R26.81);Other abnormalities of gait and mobility (R26.89);Muscle weakness (generalized) (M62.81);History of falling (Z91.81)                Time: 7564-3329 OT Time Calculation (min): 23 min Charges:  OT General Charges $OT Visit: 1 Visit OT Evaluation $OT Eval  Moderate Complexity: 1 Mod OT Treatments $Self Care/Home Management : 8-22 mins  Gerrianne Scale, MS, OTR/L ascom [phone] 04/14/20, 3:29 PM

## 2020-04-14 NOTE — Progress Notes (Signed)
Pharmacy Antibiotic Note  Thomas [name] is a 79 y.o. male admitted on 04/12/2020 with spontaneous bacterial peritonitis.  Pharmacy was consulted for vancomycin and cefepime dosing. 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 There were some issues with PD last night, however heparin is being added to facilitate PD tonight. The patient is noted to be non-anuric  Plan:  1) continue cefepime 1gm IV q24hrs   2) vancomycin 2000 mg IV x 1 loading dose  Check vancomycin level between day 3 - 5  Re-dose vancomycin once level is < 20 mcg/mL  Height: 5' 9.02" (175.3 cm) Weight: 96.9 kg (213 lb 10 oz) IBW/kg (Calculated) : 70.74  Temp (24hrs), Avg:98.2 F (36.8 C), Min:97.9 F (36.6 C), Max:98.8 F (37.1 C)  Recent Labs  Lab 04/12/20 2133 04/13/20 0119 04/13/20 0300 04/14/20 0416  WBC 14.2*  --  17.5* 15.8*  CREATININE 9.72*  --  9.57* 9.42*  LATICACIDVEN 2.4* 2.4*  --  2.1*    Estimated Creatinine Clearance: 7.3 mL/min (A) (by C-G formula based on SCr of 9.42 mg/dL (H)).    Antimicrobials this admission: 11/10 Vancomycin x 1  11/09 Cefepime>> 11/10  Flagyl>>   Microbiology results: 11/9 BCx: NG x 2 days 11/9 peritoneal fluid Cx: NG x 1 day 11/1 C diff: pending 11/9 SARS CoV-2: negative 11/9 influenza A/B: negative   Thank you for allowing pharmacy to be a part of this patient's care.  Dallie Piles, PharmD, BCPS Clinical Pharmacist 04/14/2020 7:42 AM

## 2020-04-14 NOTE — Evaluation (Signed)
Physical Therapy Evaluation Patient Details Name: Thomas [name] MRN: [id number] DOB: 09/09/1940 Today's Date: 04/14/2020   History of Present Illness  Pt is a 79yo M admitted to [facility] on 04/12/20 due to AMS, fever, generalized weakness, and diffused low back and abdominal pain (consistent with peritonitis) after being found by family to have fallen out of bed. Upon admission pt was tachycardic and febrile, concerning for sepsis. Significant PMH includes: HTN, diabetes, CAD, ESRD on PD, COPD on 5L via face tent, hypothyroidism, and nasal cancer s/p nose amputation. Per family report, there was an issue with his PD on 04/11/20 where the connection from dialysate to catheter became undone, resulting in significant leakage. Chest x-ray concerning for multifocal PNA, CT head/c-spine negative for acute process, and CT abdomen/pelvis show intraperitoneal free air likely related to recent issue with PD catheter.    Clinical Impression  Pt is a 79 year old M admitted to hospital on 04/12/20 for AMS, fever, generalized weakness, and diffused low back/abdominal pain secondary to peritonitis and sepsis. At baseline, pt was mod I with ADL's and limited community ambulation with rollator, and required assistance from family for IADL's and medication management. Pt presents with generalized weakness, decreased activity tolerance, lethargy, grossly poor balance, impaired cognition, BLE pitting edema, and increased O2 dependence from baseline, resulting in impaired functional mobility. Due to deficits, pt required mod assist for bed mobility and min guard - mod assist for seated balance at EOB. Further mobility limited secondary to generalized weakness and fatigue; pt only able to maintain static seated balance at EOB for ~45min before returning to supine. Deficits limit the pt's ability to safely and independently perform ADL's, transfer, and ambulate. Pt will benefit from acute skilled PT services to address deficits for  return to baseline function. At this time, PT recommends SNF at DC to improve overall safety with functional mobility prior to return home.     Follow Up Recommendations SNF    Equipment Recommendations  None recommended by PT    Recommendations for Other Services       Precautions / Restrictions Precautions Precautions: Fall Precaution Comments: Enteric precautions, fluid restrictions (1274mL) thin liquids      Mobility  Bed Mobility Overal bed mobility: Needs Assistance Bed Mobility: Supine to Sit     Supine to sit: Mod assist;HOB elevated     General bed mobility comments: Mod assist to sit EOB with HOB elevated and increased UE support from PT and bed rail to achieve upright positioning. Multimodal cues provided for sequencing and hand placement to ensure safety with transfer. Pt able to scoot posteriorly towards [facility] with bed in trendelenburg position, and max multimodal cues for BUE/BLE placement and sequencing.    Transfers                 General transfer comment: Unable to assess at this time due to fatigue, generalized weakness, and poor seated balance at EOB  Ambulation/Gait             General Gait Details: Unable to assess at this time due to fatigue, generalized weakness, and poor seated balance at EOB  Stairs            Wheelchair Mobility    Modified Rankin (Stroke Patients Only)       Balance Overall balance assessment: Needs assistance Sitting-balance support: Feet supported;Bilateral upper extremity supported Sitting balance-Leahy Scale: Poor Sitting balance - Comments: Poor seated balance at EOB requiring min guard - mod assist for [employer]  of upright positioning. Multimodal cues provided for hand placement to improve balance. With fatigue, pt demonstrated increased buckling of bil elbows and tremoring. Pt only able to tolerate ~30min static seated balance at EOB before returning to supine. Postural control: Posterior lean      Standing balance comment: unable to assess                             Pertinent Vitals/Pain Pain Assessment: Faces Faces Pain Scale: Hurts even more Pain Location: abdomen Pain Intervention(s): Limited activity within patient's tolerance;Monitored during session;Repositioned    Home Living Family/patient expects to be discharged to:: Private residence Living Arrangements: Children;Spouse/significant other Available Help at Discharge: Family;Available PRN/intermittently Type of Home: House Home Access: Ramped entrance     Home Layout: One level Home Equipment: Walker - 2 wheels;Wheelchair - Brewing technologist - built in      Prior Function Level of Independence: Needs assistance   Gait / Transfers Assistance Needed: Pt reports being mod I with rollator for transfers and limited community ambulation  ADL's / Homemaking Assistance Needed: Pt reports being mod I with ADL's, but requires assistance from family for IADL's and medication management  Comments: Per chart review, pt on 5L O2 via facemask     Hand Dominance   Dominant Hand: Right    Extremity/Trunk Assessment   Upper Extremity Assessment Upper Extremity Assessment: Generalized weakness    Lower Extremity Assessment Lower Extremity Assessment: Generalized weakness;LLE deficits/detail;RLE deficits/detail RLE Deficits / Details: Unable to maintain testing position against gravity RLE Sensation: decreased light touch RLE Coordination: decreased gross motor;decreased fine motor LLE Deficits / Details: Unable to maintain testing position against gravity LLE Sensation: decreased light touch LLE Coordination: decreased gross motor;decreased fine motor    Cervical / Trunk Assessment Cervical / Trunk Assessment: Kyphotic  Communication   Communication: Expressive difficulties;Other (comment) (pt perseverates with questioning)  Cognition Arousal/Alertness: Lethargic;Suspect due to medications Behavior  During Therapy: [facility] for tasks assessed/performed Overall Cognitive Status: Impaired/Different from baseline Area of Impairment: Orientation;Attention;Following commands;Problem solving                 Orientation Level: Disoriented to;Time;Situation;Place (able to state year with cues) Current Attention Level: Alternating   Following Commands: Follows one step commands inconsistently;Follows one step commands with increased time     Problem Solving: Decreased initiation;Slow processing;Difficulty sequencing;Requires verbal cues        General Comments General comments (skin integrity, edema, etc.): +4 pitting edema in bil ankles    Exercises Other Exercises Other Exercises: Pt educated regarding benefits of participation with therapy and DC recommendation to SNF. Other Exercises: Pt only able to tolerated static seated balance at EOB with BUE/BLE support for ~60min. With fatigue, pt demonstrated increased BUE tremoring and intermittent buckling of bil elbows. Required intermittent assistance of min guard - mod assist for maintenance of seated balance at EOB.   Assessment/Plan    PT Assessment Patient needs continued PT services  PT Problem List Decreased strength;Decreased mobility;Decreased coordination;Obesity;Decreased activity tolerance;Decreased cognition;Decreased balance;Impaired sensation;Pain       PT Treatment Interventions Gait training;Functional mobility training;Therapeutic activities;Therapeutic exercise;Balance training;Cognitive remediation;Neuromuscular re-education    PT Goals (Current goals can be found in the Care Plan section)  Acute Rehab PT Goals Patient Stated Goal: to be able to walk again PT Goal Formulation: With patient Time For Goal Achievement: 04/28/20 Potential to Achieve Goals: Fair    Frequency Min 2X/week   Barriers to  discharge Decreased caregiver support      Co-evaluation               AM-PAC PT "6 Clicks" Mobility   Outcome Measure Help needed turning from your back to your side while in a flat bed without using bedrails?: A Little Help needed moving from lying on your back to sitting on the side of a flat bed without using bedrails?: A Lot Help needed moving to and from a bed to a chair (including a wheelchair)?: A Lot Help needed standing up from a chair using your arms (e.g., wheelchair or bedside chair)?: A Lot Help needed to walk in hospital room?: Total Help needed climbing 3-5 steps with a railing? : Total 6 Click Score: 11    End of Session Equipment Utilized During Treatment: Oxygen Activity Tolerance: Patient limited by fatigue;Patient limited by pain Patient left: in bed;with call bell/phone within reach;with bed alarm set (BLE elevated on pillow, bed in chair position) Nurse Communication: Mobility status PT Visit Diagnosis: Muscle weakness (generalized) (M62.81);History of falling (Z91.81);Unsteadiness on feet (R26.81);Difficulty in walking, not elsewhere classified (R26.2);Pain Pain - Right/Left: Left Pain - part of body:  (abdomen)    Time: 1020-1051 PT Time Calculation (min) (ACUTE ONLY): 31 min   Charges:   PT Evaluation $PT Eval Moderate Complexity: 1 Mod PT Treatments $Therapeutic Activity: 8-22 mins        Herminio Commons, PT, DPT 12:41 PM,04/14/20

## 2020-04-14 NOTE — Progress Notes (Signed)
Peritoneal dialysis patient known at [facility]. Patient's daughter and son-in-law assist with treatments. Please contact me with any other dialysis placement concerns.  Elvera Bicker Dialysis Coordinator [phone]

## 2020-04-14 NOTE — Progress Notes (Signed)
Thomas [name] was called and message left x 2. No call back. 24 hr hot line was called because the peritoneal dialysis machine started malfunctioning and beeping. Attempted to troubleshoot. Hot line directed this nurse to drain peritoneal. Patient c/o discomfort upon dialysis.   0530- 24 hr Hot lined called again by Thomas [name]. Says that he is retaining the fluid instead of draining the fluid. Disconnected patient due to this.

## 2020-04-14 NOTE — Care Management Important Message (Signed)
Important Message  Patient Details  Name: Thomas [name] MRN: [id number] Date of Birth: 1941/05/02   Medicare Important Message Given:  N/A - LOS <3 / Initial given by admissions  Initial Medicare IM reviewed with patient's daughter, Toney Reil by Meredith Mody, Patient Access Associate on 04/14/2020 at 10:44am.    Dannette Barbara 04/14/2020, 2:56 PM

## 2020-04-15 ENCOUNTER — Encounter: Payer: Self-pay | Admitting: Internal Medicine

## 2020-04-15 DIAGNOSIS — T8571XA Infection and inflammatory reaction due to peritoneal dialysis catheter, initial encounter: Principal | ICD-10-CM

## 2020-04-15 LAB — CBC WITH DIFFERENTIAL/PLATELET
Abs Immature Granulocytes: 0.12 10*3/uL — ABNORMAL HIGH (ref 0.00–0.07)
Basophils Absolute: 0.1 10*3/uL (ref 0.0–0.1)
Basophils Relative: 1 %
Eosinophils Absolute: 0.4 10*3/uL (ref 0.0–0.5)
Eosinophils Relative: 3 %
HCT: 35.1 % — ABNORMAL LOW (ref 39.0–52.0)
Hemoglobin: 11.4 g/dL — ABNORMAL LOW (ref 13.0–17.0)
Immature Granulocytes: 1 %
Lymphocytes Relative: 9 %
Lymphs Abs: 1 10*3/uL (ref 0.7–4.0)
MCH: 27 pg (ref 26.0–34.0)
MCHC: 32.5 g/dL (ref 30.0–36.0)
MCV: 83.2 fL (ref 80.0–100.0)
Monocytes Absolute: 0.5 10*3/uL (ref 0.1–1.0)
Monocytes Relative: 4 %
Neutro Abs: 9.9 10*3/uL — ABNORMAL HIGH (ref 1.7–7.7)
Neutrophils Relative %: 82 %
Platelets: 302 10*3/uL (ref 150–400)
RBC: 4.22 MIL/uL (ref 4.22–5.81)
RDW: 18.2 % — ABNORMAL HIGH (ref 11.5–15.5)
WBC: 12.1 10*3/uL — ABNORMAL HIGH (ref 4.0–10.5)
nRBC: 0 % (ref 0.0–0.2)

## 2020-04-15 LAB — RENAL FUNCTION PANEL
Albumin: 2.2 g/dL — ABNORMAL LOW (ref 3.5–5.0)
Anion gap: 19 — ABNORMAL HIGH (ref 5–15)
BUN: 73 mg/dL — ABNORMAL HIGH (ref 8–23)
CO2: 27 mmol/L (ref 22–32)
Calcium: 8.1 mg/dL — ABNORMAL LOW (ref 8.9–10.3)
Chloride: 85 mmol/L — ABNORMAL LOW (ref 98–111)
Creatinine, Ser: 10.62 mg/dL — ABNORMAL HIGH (ref 0.61–1.24)
GFR, Estimated: 4 mL/min — ABNORMAL LOW (ref >60)
Glucose, Bld: 145 mg/dL — ABNORMAL HIGH (ref 70–99)
Phosphorus: 6 mg/dL — ABNORMAL HIGH (ref 2.5–4.6)
Potassium: 3.1 mmol/L — ABNORMAL LOW (ref 3.5–5.1)
Sodium: 131 mmol/L — ABNORMAL LOW (ref 135–145)

## 2020-04-15 LAB — GLUCOSE, CAPILLARY
Glucose-Capillary: 137 mg/dL — ABNORMAL HIGH (ref 70–99)
Glucose-Capillary: 145 mg/dL — ABNORMAL HIGH (ref 70–99)
Glucose-Capillary: 144 mg/dL — ABNORMAL HIGH (ref 70–99)
Glucose-Capillary: 117 mg/dL — ABNORMAL HIGH (ref 70–99)

## 2020-04-15 MED ORDER — ALBUMIN HUMAN 25 % IV SOLN
25.0000 g | Freq: Once | INTRAVENOUS | Status: AC
  Administered 2020-04-15: 25 g via INTRAVENOUS
  Filled 2020-04-15: qty 100

## 2020-04-15 MED ORDER — ALUM & MAG HYDROXIDE-SIMETH 200-200-20 MG/5ML PO SUSP
30.0000 mL | Freq: Once | ORAL | Status: AC
  Administered 2020-04-15: 30 mL via ORAL
  Filled 2020-04-15 (×2): qty 30

## 2020-04-15 MED ORDER — CHLORHEXIDINE GLUCONATE CLOTH 2 % EX PADS
6.0000 | MEDICATED_PAD | Freq: Every day | CUTANEOUS | Status: DC
  Administered 2020-04-19: 6 via TOPICAL

## 2020-04-15 MED ORDER — VANCOMYCIN HCL IN DEXTROSE 1-5 GM/200ML-% IV SOLN
1000.0000 mg | Freq: Once | INTRAVENOUS | Status: DC
  Filled 2020-04-15: qty 200

## 2020-04-15 MED ORDER — LEVALBUTEROL HCL 0.63 MG/3ML IN NEBU
0.6300 mg | INHALATION_SOLUTION | Freq: Two times a day (BID) | RESPIRATORY_TRACT | Status: DC
  Administered 2020-04-16 – 2020-04-19 (×5): 0.63 mg via RESPIRATORY_TRACT
  Filled 2020-04-15 (×9): qty 3

## 2020-04-15 MED ORDER — FENTANYL CITRATE (PF) 100 MCG/2ML IJ SOLN
12.5000 ug | Freq: Once | INTRAMUSCULAR | Status: AC
  Administered 2020-04-15: 12.5 ug via INTRAVENOUS
  Filled 2020-04-15: qty 2
  Filled 2020-04-15: qty 0.25

## 2020-04-15 NOTE — Progress Notes (Signed)
Thomas [name], Thomas [name] 04/15/20  Subjective:   LOS: 3 Thomas [name] is a 79 y.o. male with medical history of end-stage renal disease on peritoneal dialysis, COPD, diverticular disease, hypertension, COVID-19 infection in January 2021, hyperlipidemia, hypothyroidism, nasal mass status post removal, and obstructive sleep apnea oxygen dependent with facemask who presents to ED for altered mental status.  Nephrology was referred for recommendations of ESRD on PD.  Patient appears much comfortable today, still has abdominal distension, but reports abdominal pain is getting better. He had issues with peritoneal dialysis again last night,planning to do a session of hemodialysis today.    Objective:  Vital signs in last 24 hours:  Temp:  [97.7 F (36.5 C)-98.4 F (36.9 C)] 97.7 F (36.5 C) (11/12 0903) Pulse Rate:  [72-90] 72 (11/12 1209) Resp:  [14-20] 16 (11/12 1209) BP: (118-133)/(46-67) 118/48 (11/12 1209) SpO2:  [68 %-100 %] 97 % (11/12 1209) FiO2 (%):  [35 %] 35 % (11/12 0838) Weight:  [96.6 kg] 96.6 kg (11/12 0509)  Weight change: -0.3 kg Filed Weights   04/12/20 2206 04/14/20 0500 04/15/20 0509  Weight: 102.7 kg 96.9 kg 96.6 kg    Intake/Output:    Intake/Output Summary (Last 24 hours) at 04/15/2020 1438 Last data filed at 04/14/2020 1500 Gross per 24 hour  Intake 446.48 ml  Output --  Net 446.48 ml     Physical Exam: General: Resting in bed, in no acute distress  HEENT Rhinectomy, turbinates visible and pink  Pulm/lungs Crackles + , respiration even,unlabored  CVS/Heart Regular rate and rhythm  Abdomen:  Distended + +   Extremities: Trace peripheral edema  Neurologic: Oriented x 3, speech clear and appropriate  Skin: No rashes or lesions noted  Access: Peritoneal catheter with dressing clean,dry and intact   LUA AVF +bruit,+thrill    Basic Metabolic Panel:  Recent Labs  Lab 04/12/20 2133 04/12/20 2133 04/13/20 0119  04/13/20 0300 04/14/20 0416 04/15/20 0448  NA 134*  --   --  132* 132* 131*  K 4.3  --   --  4.3 3.7 3.1*  CL 89*  --   --  92* 88* 85*  CO2 26  --   --  22 28 27   GLUCOSE 154*  --   --  165* 211* 145*  BUN 59*  --   --  60* 62* 73*  CREATININE 9.72*  --   --  9.57* 9.42* 10.62*  CALCIUM 9.0   < >  --  8.3* 8.3* 8.1*  MG  --   --  1.7  --   --   --   PHOS  --   --   --   --  4.9* 6.0*   < > = values in this interval not displayed.     CBC: Recent Labs  Lab 04/12/20 2133 04/13/20 0300 04/14/20 0416 04/15/20 0448  WBC 14.2* 17.5* 15.8* 12.1*  NEUTROABS 13.0*  --  13.3* 9.9*  HGB 11.0* 10.0* 10.6* 11.4*  HCT 33.7* 31.3* 33.3* 35.1*  MCV 83.6 84.1 84.1 83.2  PLT 297 266 264 302      Lab Results  Component Value Date   HEPBSAG Negative 01/25/2019      Microbiology:  Recent Results (from the past 240 hour(s))  Culture, blood (routine x 2)     Status: None (Preliminary result)   Collection Time: 04/12/20  9:33 PM   Specimen: BLOOD  Result Value Ref Range Status   Specimen Description BLOOD  RIGHT ASSIST CONTROL  Final   Special Requests   Final    BOTTLES DRAWN AEROBIC AND ANAEROBIC Blood Culture results may not be optimal due to an inadequate volume of blood received in culture bottles   Culture   Final    NO GROWTH 3 DAYS Performed at [facility], [address], [place], [place] [address]    Report Status PENDING  Incomplete  Respiratory Panel by RT PCR (Flu A&B, Covid) - Nasopharyngeal Swab     Status: None   Collection Time: 04/12/20  9:38 PM   Specimen: Nasopharyngeal Swab  Result Value Ref Range Status   SARS Coronavirus 2 by RT PCR NEGATIVE NEGATIVE Final    Comment: (NOTE) SARS-CoV-2 target nucleic acids are NOT DETECTED.  The SARS-CoV-2 RNA is generally detectable in upper respiratoy specimens during the acute phase of infection. The lowest concentration of SARS-CoV-2 viral copies this assay can detect is 131 copies/mL. A negative result  does not preclude SARS-Cov-2 infection and should not be used as the sole basis for treatment or other patient management decisions. A negative result may occur with  improper specimen collection/handling, submission of specimen other than nasopharyngeal swab, presence of viral mutation(s) within the areas targeted by this assay, and inadequate number of viral copies (<131 copies/mL). A negative result must be combined with clinical observations, patient history, and epidemiological information. The expected result is Negative.  Fact Sheet for Patients:  PinkCheek.be  Fact Sheet for Healthcare Providers:  GravelBags.it  This test is no t yet approved or cleared by the [place] FDA and  has been authorized for detection and/or diagnosis of SARS-CoV-2 by FDA under an Emergency Use Authorization (EUA). This EUA will remain  in effect (meaning this test can be used) for the duration of the COVID-19 declaration under Section 564(b)(1) of the Act, 21 U.S.C. section 360bbb-3(b)(1), unless the authorization is terminated or revoked sooner.     Influenza A by PCR NEGATIVE NEGATIVE Final   Influenza B by PCR NEGATIVE NEGATIVE Final    Comment: (NOTE) The Xpert Xpress SARS-CoV-2/FLU/RSV assay is intended as an aid in  the diagnosis of influenza from Nasopharyngeal swab specimens and  should not be used as a sole basis for treatment. Nasal washings and  aspirates are unacceptable for Xpert Xpress SARS-CoV-2/FLU/RSV  testing.  Fact Sheet for Patients: PinkCheek.be  Fact Sheet for Healthcare Providers: GravelBags.it  This test is not yet approved or cleared by the [place] FDA and  has been authorized for detection and/or diagnosis of SARS-CoV-2 by  FDA under an Emergency Use Authorization (EUA). This EUA will remain  in effect (meaning this test can be used) for  the duration of the  Covid-19 declaration under Section 564(b)(1) of the Act, 21  U.S.C. section 360bbb-3(b)(1), unless the authorization is  terminated or revoked. Performed at [facility], [address] [place], [place] [address]   Peritoneal Fluid culture ( includes gram stain)     Status: None (Preliminary result)   Collection Time: 04/12/20 10:28 PM   Specimen: Peritoneal Washings; Peritoneal Fluid  Result Value Ref Range Status   Specimen Description   Final    PERITONEAL Performed at [facility], [address], [place], [place] [address]    Special Requests   Final    NONE Performed at [facility], [address] [place], [place] [address]    Gram Stain   Final    ABUNDANT WBC PRESENT, PREDOMINANTLY PMN NO  ORGANISMS SEEN    Culture   Final    NO GROWTH 2 DAYS Performed at [facility], [place] [address], [place], [place] [address]    Report Status PENDING  Incomplete  Culture, blood (Routine X 2) w Reflex to ID Panel     Status: None (Preliminary result)   Collection Time: 04/13/20  6:40 PM   Specimen: BLOOD  Result Value Ref Range Status   Specimen Description BLOOD RIGHT ANTECUBITAL  Final   Special Requests   Final    BOTTLES DRAWN AEROBIC AND ANAEROBIC Blood Culture adequate volume   Culture   Final    NO GROWTH 2 DAYS Performed at [facility], [address], [place], [place] [address]    Report Status PENDING  Incomplete  C Difficile Quick Screen w PCR reflex     Status: None   Collection Time: 04/14/20 10:05 PM   Specimen: STOOL  Result Value Ref Range Status   C Diff antigen NEGATIVE NEGATIVE Final   C Diff toxin NEGATIVE NEGATIVE Final   C Diff interpretation No C. difficile detected.  Final    Comment: Performed at [facility], [address] [place], [place] [address]    Coagulation Studies: Recent Labs    04/13/20 0300  LABPROT 13.9  INR 1.1    Urinalysis: No results for  input(s): COLORURINE, LABSPEC, PHURINE, GLUCOSEU, HGBUR, BILIRUBINUR, KETONESUR, PROTEINUR, UROBILINOGEN, NITRITE, LEUKOCYTESUR in the last 72 hours.  Invalid input(s): APPERANCEUR    Imaging: No results found.   Medications:   . dialysis solution 2.5% low-MG/low-CA Stopped (04/13/20 1638)  . piperacillin-tazobactam (ZOSYN)  IV 2.25 g (04/14/20 1458)   . albuterol  2 mg Oral BID  . calcium acetate  1,334 mg Oral TID WC  . calcium-vitamin D   Oral Daily  . [START ON 04/16/2020] Chlorhexidine Gluconate Cloth  6 each Topical Q0600  . cholecalciferol  2,000 Units Oral BID  . epoetin (EPOGEN/PROCRIT) injection  10,000 Units Subcutaneous Weekly  . extraneal (ICODEXTRIN) peritoneal dialysis solution   Intraperitoneal Q24H  . fluticasone furoate-vilanterol  1 puff Inhalation Daily   And  . umeclidinium bromide  1 puff Inhalation Daily  . gentamicin cream  1 application Topical Daily  . heparin injection (subcutaneous)  5,000 Units Subcutaneous Q8H  . insulin aspart  0-5 Units Subcutaneous QHS  . insulin aspart  0-6 Units Subcutaneous TID WC  . insulin glargine  10 Units Subcutaneous QHS  . isosorbide mononitrate  30 mg Oral Daily  . levalbuterol  0.63 mg Nebulization Q8H  . multivitamin  1 tablet Oral QHS  . pantoprazole  40 mg Oral Daily   acetaminophen **OR** acetaminophen, albuterol, calcium acetate **AND** calcium acetate, heparin, hydrALAZINE, ondansetron **OR** ondansetron (ZOFRAN) IV  Assessment/ Plan:  79 y.o. male with ESRD on PD, COPD, Diverticular disease, HTN, Covid 19 infection in Jan 2021, HLD, hypothyroidism, nasal mass removal, h/o Sub dural hematoma, OSA was admitted on 04/12/2020 for  Principal Problem:   Sepsis [facility]) Active Problems:   Coronary atherosclerosis   Esophageal reflux   End stage renal disease (HCC)   COPD (chronic obstructive pulmonary disease) ([place])   Spontaneous bacterial peritonitis ([place])   Dialysis-associated peritonitis ([place])   Goals of  care, counseling/discussion   Palliative care by specialist   DNR (do not resuscitate)  Peritonitis associated with peritoneal dialysis, initial encounter ([place]) [T85.71XA] Sepsis ([place]) [A41.9] Altered mental status, unspecified altered mental status type [R41.82] Sepsis without acute organ dysfunction, due  to unspecified organism ([place]) [A41.9]  #. ESRD Patient had issues with peritoneal dialysis again, with draining the fluid We will plan hemodialysis today  He has left brachiocephalic AVF for HD access  # Acute Peritonitis Abdominal pain better better today, but has significant distension Patient is on antibiotics, Vancomycin and Zocyn  #. Anemia of CKD  Lab Results  Component Value Date   HGB 11.4 (L) 04/15/2020   Will continue Epogen weekly  #. Secondary hyperparathyroidism of renal origin N 25.81      Component Value Date/Time   PTH 218 (H) 01/26/2019 1228   Lab Results  Component Value Date   PHOS 6.0 (H) 04/15/2020  Calcium 8.1 Will continue Calcium supplements and Phoslo  #. Diabetes type 2 with CKD Hgb A1c MFr Bld (%)  Date Value  04/13/2020 7.5 (H)  Blood glucose readings within acceptable range  #Hyponatremia Na+ 131 Will continue monitoring closely  #Hypokalemia  Potassium 3.1 today Will use 4K+ bath for dialysis    LOS: 3 Jaleesa Cervi 11/12/20212:38 PM  [facility], [place]

## 2020-04-15 NOTE — TOC Progression Note (Signed)
Transition of Care [facility]) - Progression Note    Patient Details  Name: Thomas [name] MRN: [id number] Date of Birth: 12/07/1940  Transition of Care [facility]) CM/SW [place], LCSW Phone Number: 04/15/2020, 3:15 PM  Clinical Narrative:    atient's daughter agreed to allow patient's information to be sent to [facility] SNF, but stipulated if at all possible he will come home instead. CSW sent out bed request, passr, and insurance auth submitted with start date of 11/15.          Expected Discharge Plan and Services                                                 Social Determinants of Health (SDOH) Interventions    Readmission Risk Interventions Readmission Risk Prevention Plan 01/30/2019  Transportation Screening Complete  PCP or Specialist Appt within 3-5 Days Complete  HRI or [place] Complete  Social Work Consult for [place] Planning/Counseling Complete  Palliative Care Screening Not Applicable  Medication Review Press photographer) Complete  Some recent data might be hidden

## 2020-04-15 NOTE — Progress Notes (Signed)
Pt hypotensive during tx. Dr Candiss Norse informed, order for zero pull and albumin. Multiple NS bolus given during tx, with little response. Pain medication given for by floor nurse.  At 1800, pt complained of chest pain 10/10. Rapid response called, stat EKG ordered, NSR found. Tx terminated 1.18 hrs early, all blood returned.

## 2020-04-15 NOTE — Significant Event (Signed)
Rapid Response Event Note   Reason for Call :  10/10 sudden onset sharp chest pain during dialysis  Initial Focused Assessment:  Rapid response RN arrived to dialysis room with patient in the bed still connected to the dialysis machine but getting treatment rinsed back (as much as possible as per dialysis RN, circuit might be clotted). 2C RNs at bedside with dialysis RNs. Per dialysis RN patient had 10/10 sharp chest pain during dialysis treatment, but after rapid response called patient had a large burp and felt chest pain was better. Vital signs HR 82 SR, oxygen 96% on 6L facetent, BP 102/70 MAP 82, RR 16 and unlabored.  Interventions:  Patient's 2C RNs administered prn fentanyl for pain and dialysis RN loosened belt around his upper abdomen for his PD catheter. 12 lead EKG obtained which appeared to show SR with a PVC, no visible ST elevation to this RN. After prn fentanyl patient reported no pain.  Plan of Care:  Per Dr. Candiss Norse, dialysis treatment ended (primarily due to system clotting) and patient returning to [facility]. Given patient's resolution of chest pain (primarily after burping) and unremarkable EKG, patient to remain with 2C. 2Cs instructed to recall rapid response if needed.  Event Summary:   MD Notified: Dr. Candiss Norse (nephrology) Call Time: 1804 Arrival Time: 1805 End Time: Mellody Drown, RN

## 2020-04-15 NOTE — Progress Notes (Signed)
I had to contact [facility] tech support because the PD machine started alarming. Katharine Look from support told me that the machine was not draining fast enough and that the patient was retaining fluid. She stated that it was a problem with the PD catheter.  I was instructed to end therapy and disconnect the machine from the patient. This also happened last night as well. There needs to be another alternative because peritoneal dialysis is not working for this patient.  Will continue to monitor.  Phoebe Sharps N 04/15/2020  1:22 AM

## 2020-04-15 NOTE — Progress Notes (Signed)
Notified Attending MD that patient has inflamed foreskin, unable to retract to clean

## 2020-04-15 NOTE — Progress Notes (Signed)
Pharmacy Antibiotic Note  Thomas [name] is a 79 y.o. male admitted on 04/12/2020 with spontaneous bacterial peritonitis.  Pharmacy was consulted for vancomycin and cefepime dosing. 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 There were some issues with PD again last night. It's unclear if there was any filtration during the two failed PD sessions. Because of this he is scheduled for HD today. The patient is noted to be non-anuric. He has had 2 previous vancomycin doses of 2000 mg on 11/9 and 11/11  Plan:  1) continue cefepime 1 gram IV every 24 hrs   2) hold vancomycin today with HD given two recent 2000 mg doses and  uncertainty over filtration.   Vancomycin level after HD today  Goal vancomycin level 15 - 25 mcg/mL  Vancomycin 1000 mg after each HD if level is in range  Assess vancomycin daily based on HD/PD needs   Subsequent vancomycin levels as clinically indicated  Height: 5' 9.02" (175.3 cm) Weight: 96.6 kg (212 lb 15.4 oz) IBW/kg (Calculated) : 70.74  Temp (24hrs), Avg:98.1 F (36.7 C), Min:97.7 F (36.5 C), Max:98.6 F (37 C)  Recent Labs  Lab 04/12/20 2133 04/13/20 0119 04/13/20 0300 04/14/20 0416 04/15/20 0448  WBC 14.2*  --  17.5* 15.8* 12.1*  CREATININE 9.72*  --  9.57* 9.42* 10.62*  LATICACIDVEN 2.4* 2.4*  --  2.1*  --     Estimated Creatinine Clearance: 6.5 mL/min (A) (by C-G formula based on SCr of 10.62 mg/dL (H)).    Antimicrobials this admission: 11/09  Flagyl>> 11/10  11/10 Vancomycin >> 11/09 Cefepime>>  Microbiology results: 11/9 BCx: NG x 3 days 11/9 peritoneal fluid Cx: NG x 2 days 11/1 C diff: negative 11/9 SARS CoV-2: negative 11/9 influenza A/B: negative   Thank you for allowing pharmacy to be a part of this patient's care.  Dallie Piles, PharmD, BCPS Clinical Pharmacist 04/15/2020 8:13 AM

## 2020-04-15 NOTE — NC FL2 (Signed)
[place] LEVEL OF CARE SCREENING TOOL     IDENTIFICATION  Patient Name: Thomas [name] Birthdate: May 03, 1941 Sex: male Admission Date (Current Location): 04/12/2020  [place] and [place] Number:  Engineering geologist and Address:  [facility], [address], [place], [place] [address]      Provider Number: [id number]  Attending Physician Name and Address:  Alma Friendly, MD  Relative Name and Phone Number:  Toney Reil (Daughter) [phone]    Current Level of Care:   Recommended Level of Care:   Prior Approval Number:    Date Approved/Denied:   PASRR Number: [id number]  Discharge Plan: SNF    Current Diagnoses: Patient Active Problem List   Diagnosis Date Noted  . Dialysis-associated peritonitis ([place])   . Goals of care, counseling/discussion   . Palliative care by specialist   . DNR (do not resuscitate)   . Spontaneous bacterial peritonitis ([place]) 04/12/2020  . Pneumonia due to COVID-19 virus 06/09/2019  . Sepsis ([place]) 06/08/2019  . Thrombocytopenia ([place]) 06/08/2019  . Chronic respiratory failure with hypoxia ([place]) 06/08/2019  . COPD (chronic obstructive pulmonary disease) ([place])   . HTN (hypertension)   . COVID-19 virus infection   . Volume overload 01/26/2019  . Pneumonia 01/25/2019  . Generalized weakness 01/24/2019  . Congestive heart failure ([place]) 05/11/2016  . Venous stenosis of left upper extremity 05/11/2016  . End stage renal disease ([place]) 06/19/2013  . Rectal bleeding 11/27/2012  . Upper abdominal pain 07/07/2012  . Anemia 07/07/2012  . Chronic nausea 07/07/2012  . Microscopic colitis 03/07/2012  . Diarrhea of presumed infectious origin 02/01/2010  . DIVERTICULITIS, HX OF 02/01/2010  . CONSTIPATION 09/10/2008  . ABDOMINAL PAIN, LOWER 09/10/2008  . Insulin-requiring or dependent type II diabetes mellitus ([place]) 09/09/2008  . CIGARETTE SMOKER 09/09/2008  . Coronary atherosclerosis 09/09/2008  .  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 Esophageal reflux 09/09/2008  . HIATAL HERNIA 09/09/2008  . HOARSENESS 09/09/2008    Orientation RESPIRATION BLADDER Height & Weight     Self, Place    Incontinent Weight: 212 lb 15.4 oz (96.6 kg) Height:  5' 9.02" (175.3 cm)  BEHAVIORAL SYMPTOMS/MOOD NEUROLOGICAL BOWEL NUTRITION STATUS      Incontinent    AMBULATORY STATUS COMMUNICATION OF NEEDS Skin   Extensive Assist Verbally                         Personal Care Assistance Level of Assistance  Bathing, Feeding, Dressing, Total care Bathing Assistance: Limited assistance Feeding assistance: Limited assistance Dressing Assistance: Maximum assistance Total Care Assistance: Maximum assistance   Functional Limitations Info             SPECIAL CARE FACTORS FREQUENCY  PT (By licensed PT), OT (By licensed OT)     PT Frequency: 5 x weekly OT Frequency: 5 x weekly            Contractures      Additional Factors Info  Code Status Code Status Info: DNR             Current Medications (04/15/2020):  This is the current hospital active medication list Current Facility-Administered Medications  Medication Dose Route Frequency Provider Last Rate Last Admin  . acetaminophen (TYLENOL) tablet 650 mg  650 mg Oral Q6H PRN Elwyn Reach, MD   650 mg at 04/13/20 2153   Or  . acetaminophen (TYLENOL) suppository 650 mg  650 mg Rectal Q6H PRN Gala Romney L,  MD      . albuterol (PROVENTIL) (2.5 MG/3ML) 0.083% nebulizer solution 2.5 mg  2.5 mg Nebulization Q6H PRN Alma Friendly, MD      . albuterol (VENTOLIN) 2 MG/5ML syrup 2 mg  2 mg Oral BID Alma Friendly, MD   2 mg at 04/15/20 1024  . calcium acetate (PHOSLO) capsule 1,334 mg  1,334 mg Oral TID WC Shanlever, Pierce Crane, RPH       And  . calcium acetate (PHOSLO) capsule 667 mg  667 mg Oral BID PRN Shanlever, Pierce Crane, RPH      . calcium-vitamin D (OSCAL WITH D) 500-200 MG-UNIT per tablet   Oral Daily Alma Friendly, MD   1 tablet at 04/15/20 1024  . [START ON 04/16/2020] Chlorhexidine Gluconate Cloth 2 % PADS 6 each  6 each Topical Q0600 Murlean Iba, MD      . cholecalciferol (VITAMIN D3) tablet 2,000 Units  2,000 Units Oral BID Alma Friendly, MD   2,000 Units at 04/15/20 1024  . dialysis solution 2.5% low-MG/low-CA dianeal solution   Intraperitoneal Q24H Murlean Iba, MD   Held at 04/13/20 1638  . epoetin alfa (EPOGEN) injection 10,000 Units  10,000 Units Subcutaneous Weekly Candiss Norse, Harmeet, MD      . extraneal (ICODEXTRIN) peritoneal dialysis solution   Intraperitoneal Q24H Singh, Harmeet, MD      . fluticasone furoate-vilanterol (BREO ELLIPTA) 100-25 MCG/INH 1 puff  1 puff Inhalation Daily Lu Duffel, RPH   1 puff at 04/15/20 0908   And  . umeclidinium bromide (INCRUSE ELLIPTA) 62.5 MCG/INH 1 puff  1 puff Inhalation Daily Lu Duffel, RPH   1 puff at 04/15/20 0909  . gentamicin cream (GARAMYCIN) 0.1 % 1 application  1 application Topical Daily Murlean Iba, MD   1 application at 72/53/66 1007  . heparin 1000 unit/ml injection 500 Units  500 Units Intraperitoneal PRN Murlean Iba, MD      . heparin injection 5,000 Units  5,000 Units Subcutaneous Q8H Alma Friendly, MD   5,000 Units at 04/14/20 1455  . hydrALAZINE (APRESOLINE) tablet 25 mg  25 mg Oral Q8H PRN Alma Friendly, MD      . insulin aspart (novoLOG) injection 0-5 Units  0-5 Units Subcutaneous QHS Garba, Mohammad L, MD      . insulin aspart (novoLOG) injection 0-6 Units  0-6 Units Subcutaneous TID WC Elwyn Reach, MD   1 Units at 04/14/20 1727  . insulin glargine (LANTUS) injection 10 Units  10 Units Subcutaneous QHS Alma Friendly, MD   10 Units at 04/14/20 2042  . isosorbide mononitrate (IMDUR) 24 hr tablet 30 mg  30 mg Oral Daily Alma Friendly, MD   30 mg at 04/15/20 1024  . levalbuterol (XOPENEX) nebulizer solution 0.63 mg  0.63 mg Nebulization Q8H Alma Friendly, MD   0.63  mg at 04/15/20 0838  . multivitamin (RENA-VIT) tablet 1 tablet  1 tablet Oral QHS Alma Friendly, MD   1 tablet at 04/14/20 2043  . ondansetron (ZOFRAN) tablet 4 mg  4 mg Oral Q6H PRN Elwyn Reach, MD       Or  . ondansetron (ZOFRAN) injection 4 mg  4 mg Intravenous Q6H PRN Elwyn Reach, MD   4 mg at 04/13/20 1831  . pantoprazole (PROTONIX) EC tablet 40 mg  40 mg Oral Daily Alma Friendly, MD   40 mg at 04/15/20 1024  . piperacillin-tazobactam (ZOSYN) IVPB  2.25 g  2.25 g Intravenous Q8H Alma Friendly, MD 100 mL/hr at 04/14/20 1458 2.25 g at 04/14/20 1458     Discharge Medications: Please see discharge summary for a list of discharge medications.  Relevant Imaging Results:  Relevant Lab Results:   Additional Information SSN# [id number]; HD MWF [facility]  Meriel Flavors, LCSW

## 2020-04-15 NOTE — Progress Notes (Signed)
   04/15/20 1800  Clinical Encounter Type  Visited With Patient;Health care provider  Visit Type Initial  Referral From Nurse  Consult/Referral To Chaplain  Chaplain responded to a RR pg. When chaplain arrived a nurse said everything was alright. RR was canceled.

## 2020-04-15 NOTE — Progress Notes (Signed)
Pt scored a 9 on the RT protocol assessment. The nebulizers are changed to BID.

## 2020-04-15 NOTE — Progress Notes (Signed)
IV team attempted to place an IV but were unsuccessful.  Patient currently has no IV access.  Will continue to monitor.  Christene Slates

## 2020-04-15 NOTE — Progress Notes (Signed)
PROGRESS NOTE  Thomas [name] WJX:[id number] DOB: Jan 30, 1941 DOA: 04/12/2020 PCP: [facility]  HPI/Recap of past 24 hours: HPI from Dr [name] is a 79 y.o. male with medical history significant of end-stage renal disease on peritoneal dialysis, COPD, diverticular disease, hypertension, COVID-19 infection in January of this year, hyperlipidemia, hypothyroidism, nasal mass status post nose removal, history of subdural hematoma and obstructive sleep apnea oxygen dependent with facemask who was brought in with abdominal pain fever and chills.  Patient apparently had some issues with his peritoneal dialysis fluid and nonsterile procedure and exchange of fluid was performed few days ago. Started having significant abdominal pain altered mental status and fever.  Patient admitted for further management.  In the ER, patient was noted to have diffuse abdominal tenderness with rebound consistent with peritonitis.  Aspiration of his peritoneal fluid showed cloudy fluid which has been sent to the lab, peritoneal fluid culture and blood cultures have been taken, started on antibiotics with suspected spontaneous bacterial peritonitis.  Noted to have temperature 100.9 blood pressure 166/81 pulse 110 respiratory rate of 22 oxygen sat 90%.  Sodium is 134 potassium 4.3 chloride of 89 CO2 26 glucose 154 BUN 59 creatinine 9.72 and a gap of 19.  Troponin is 24 lactic acid 2.4 white count 14.2 hemoglobin 11.1 platelets of 297.  COVID-19 screen is negative.      Today, patient noted to be more alert, eager to have his breakfast.  Still reports some abdominal tenderness but denies its worsening, still with abdominal distention, denies any fever/chills, chest pain, worsening shortness of breath, nausea/vomiting.  PD catheter most likely not functioning well as PD has not been successful x2.  Plan is to switch over to HD as patient has access.   Assessment/Plan: Principal Problem:   Sepsis  ([place]) Active Problems:   Coronary atherosclerosis   Esophageal reflux   End stage renal disease (HCC)   COPD (chronic obstructive pulmonary disease) (HCC)   Spontaneous bacterial peritonitis (H[facility])   Dialysis-associated peritonitis (H[facility])   Goals of care, counseling/discussion   Palliative care by specialist   DNR (do not resuscitate)   Severe sepsis likely 2/2 peritonitis associated with PD On admission, temp 100.9, heart rate 110, respiratory rate 22, WBC 14.2, lactic acid 2.4 with altered mental status Likely complication from peritoneal dialysis Currently afebrile, with leukocytosis Lactic acid, procalcitonin elevated (worsened likely due to ESRD, not getting dialysis) BC x2 NGTD Peritoneal fluid analysis culture NGTD Continue IV Zosyn, vancomycin Monitor closely  Acute on chronic hypoxic respiratory failure Possible CAP History of COPD Patient noted to be tachypneic, requiring about 6 L of O2 via facemask, 5 L at home Chest x-ray showing multifocal left lung infiltrate, with small associated parapneumonic effusion Continue IV Zosyn, vancomycin for now, duo nebs, inhaler Supplemental O2  ESRD On PD, PD catheter malfunction, nephrology plan to switch HD, first session planned for 04/15/20 Nephrology consulted  Hypokalemia Correction per Nephrology  Anemia of chronic kidney disease/iron deficiency Baseline hemoglobin around 12 Anemia panel showed iron 17, sats 12, ferritin 796 On weekly Epogen Daily CBC, monitor for any signs of bleeding  Hypertension Stable Continue home Imdur, hydralazine as needed  Diabetes mellitus type 2 A1c on 04/13/2020 was 7.5 SSI, Accu-Cheks, Lantus, hypoglycemic protocol  GERD Continue PPI  OSA Continue oxygen via facemask  Obesity Lifestyle modification advised  Goals of care discussion Patient with multiple comorbidities, poor prognosis Palliative consult for further goals of care discussion, now made DNR  Malnutrition Type:      Malnutrition Characteristics:      Nutrition Interventions:       Estimated body mass index is 31.43 kg/m as calculated from the following:   Height as of this encounter: 5' 9.02" (1.753 m).   Weight as of this encounter: 96.6 kg.     Code Status: Full  Family Communication: Plan to discuss with family  Disposition Plan: Status is: Inpatient  Remains inpatient appropriate because:Inpatient level of care appropriate due to severity of illness   Dispo: The patient is from: Home              Anticipated d/c is to: SNF              Anticipated d/c date is: 2 days              Patient currently is not medically stable to d/c.    Consultants:  Nephrology  Procedures:  None  Antimicrobials:  IV Zosyn   Vancomycin  DVT prophylaxis: Heparin [place]   Objective: Vitals:   04/15/20 0509 04/15/20 0838 04/15/20 0903 04/15/20 1209  BP: 133/67  (!) 123/46 (!) 118/48  Pulse: 86  78 72  Resp: 16  14 16   Temp: 97.7 F (36.5 C)  97.7 F (36.5 C)   TempSrc: Oral     SpO2: 91% 100% 95% 97%  Weight: 96.6 kg     Height:       No intake or output data in the 24 hours ending 04/15/20 1617 Filed Weights   04/12/20 2206 04/14/20 0500 04/15/20 0509  Weight: 102.7 kg 96.9 kg 96.6 kg    Exam:  General:  Absent nose s/p surgery, chronically ill-appearing  Cardiovascular: S1, S2 present  Respiratory:  Diminished breath sounds bilaterally  Abdomen: Soft, generalized tenderness, distended, bowel sounds present  Musculoskeletal: No bilateral pedal edema noted  Skin: Normal  Psychiatry:  Normal mood   Data Reviewed: CBC: Recent Labs  Lab 04/12/20 2133 04/13/20 0300 04/14/20 0416 04/15/20 0448  WBC 14.2* 17.5* 15.8* 12.1*  NEUTROABS 13.0*  --  13.3* 9.9*  HGB 11.0* 10.0* 10.6* 11.4*  HCT 33.7* 31.3* 33.3* 35.1*  MCV 83.6 84.1 84.1 83.2  PLT 297 266 264 737   Basic Metabolic Panel: Recent Labs  Lab 04/12/20 2133  04/13/20 0119 04/13/20 0300 04/14/20 0416 04/15/20 0448  NA 134*  --  132* 132* 131*  K 4.3  --  4.3 3.7 3.1*  CL 89*  --  92* 88* 85*  CO2 26  --  22 28 27   GLUCOSE 154*  --  165* 211* 145*  BUN 59*  --  60* 62* 73*  CREATININE 9.72*  --  9.57* 9.42* 10.62*  CALCIUM 9.0  --  8.3* 8.3* 8.1*  MG  --  1.7  --   --   --   PHOS  --   --   --  4.9* 6.0*   GFR: Estimated Creatinine Clearance: 6.5 mL/min (A) (by C-G formula based on SCr of 10.62 mg/dL (H)). Liver Function Tests: Recent Labs  Lab 04/12/20 2133 04/13/20 0300 04/14/20 0416 04/15/20 0448  AST 19 17  --   --   ALT 15 13  --   --   ALKPHOS 137* 105  --   --   BILITOT 0.7 0.9  --   --   PROT 7.2 6.2*  --   --   ALBUMIN 2.9* 2.5* 2.5* 2.2*   No results for  input(s): LIPASE, AMYLASE in the last 168 hours. No results for input(s): AMMONIA in the last 168 hours. Coagulation Profile: Recent Labs  Lab 04/13/20 0300  INR 1.1   Cardiac Enzymes: No results for input(s): CKTOTAL, CKMB, CKMBINDEX, TROPONINI in the last 168 hours. BNP (last 3 results) No results for input(s): PROBNP in the last 8760 hours. HbA1C: Recent Labs    04/13/20 0252  HGBA1C 7.5*   CBG: Recent Labs  Lab 04/14/20 1641 04/14/20 2038 04/15/20 0803 04/15/20 1208 04/15/20 1605  GLUCAP 157* 106* 137* 145* 144*   Lipid Profile: No results for input(s): CHOL, HDL, LDLCALC, TRIG, CHOLHDL, LDLDIRECT in the last 72 hours. Thyroid Function Tests: No results for input(s): TSH, T4TOTAL, FREET4, T3FREE, THYROIDAB in the last 72 hours. Anemia Panel: Recent Labs    04/14/20 0416  VITAMINB12 1,492*  FOLATE 44.0  FERRITIN 796*  TIBC 139*  IRON 17*   Urine analysis:    Component Value Date/Time   COLORURINE YELLOW 06/15/2008 1856   APPEARANCEUR CLEAR 06/15/2008 1856   LABSPEC >1.030 (H) 06/15/2008 1856   PHURINE 5.0 06/15/2008 1856   GLUCOSEU 250 06/29/2012 1308   HGBUR TRACE (A) 06/15/2008 1856   BILIRUBINUR SMALL (A) 06/15/2008 1856    KETONESUR TRACE (A) 06/15/2008 1856   PROTEINUR >300 (A) 06/15/2008 1856   UROBILINOGEN 0.2 06/15/2008 1856   NITRITE NEGATIVE 06/15/2008 1856   LEUKOCYTESUR NEGATIVE 06/15/2008 1856   Sepsis Labs: @LABRCNTIP (procalcitonin:4,lacticidven:4)  ) Recent Results (from the past 240 hour(s))  Culture, blood (routine x 2)     Status: None (Preliminary result)   Collection Time: 04/12/20  9:33 PM   Specimen: BLOOD  Result Value Ref Range Status   Specimen Description BLOOD RIGHT ASSIST CONTROL  Final   Special Requests   Final    BOTTLES DRAWN AEROBIC AND ANAEROBIC Blood Culture results may not be optimal due to an inadequate volume of blood received in culture bottles   Culture   Final    NO GROWTH 3 DAYS Performed at [facility], [address], [place], [place] [address]    Report Status PENDING  Incomplete  Respiratory Panel by RT PCR (Flu A&B, Covid) - Nasopharyngeal Swab     Status: None   Collection Time: 04/12/20  9:38 PM   Specimen: Nasopharyngeal Swab  Result Value Ref Range Status   SARS Coronavirus 2 by RT PCR NEGATIVE NEGATIVE Final    Comment: (NOTE) SARS-CoV-2 target nucleic acids are NOT DETECTED.  The SARS-CoV-2 RNA is generally detectable in upper respiratoy specimens during the acute phase of infection. The lowest concentration of SARS-CoV-2 viral copies this assay can detect is 131 copies/mL. A negative result does not preclude SARS-Cov-2 infection and should not be used as the sole basis for treatment or other patient management decisions. A negative result may occur with  improper specimen collection/handling, submission of specimen other than nasopharyngeal swab, presence of viral mutation(s) within the areas targeted by this assay, and inadequate number of viral copies (<131 copies/mL). A negative result must be combined with clinical observations, patient history, and epidemiological information. The expected result is Negative.  Fact Sheet for  Patients:  PinkCheek.be  Fact Sheet for Healthcare Providers:  GravelBags.it  This test is no t yet approved or cleared by the [place] FDA and  has been authorized for detection and/or diagnosis of SARS-CoV-2 by FDA under an Emergency Use Authorization (EUA). This EUA will remain  in effect (meaning this test can be used) for the  duration of the COVID-19 declaration under Section 564(b)(1) of the Act, 21 U.S.C. section 360bbb-3(b)(1), unless the authorization is terminated or revoked sooner.     Influenza A by PCR NEGATIVE NEGATIVE Final   Influenza B by PCR NEGATIVE NEGATIVE Final    Comment: (NOTE) The Xpert Xpress SARS-CoV-2/FLU/RSV assay is intended as an aid in  the diagnosis of influenza from Nasopharyngeal swab specimens and  should not be used as a sole basis for treatment. Nasal washings and  aspirates are unacceptable for Xpert Xpress SARS-CoV-2/FLU/RSV  testing.  Fact Sheet for Patients: PinkCheek.be  Fact Sheet for Healthcare Providers: GravelBags.it  This test is not yet approved or cleared by the [place] FDA and  has been authorized for detection and/or diagnosis of SARS-CoV-2 by  FDA under an Emergency Use Authorization (EUA). This EUA will remain  in effect (meaning this test can be used) for the duration of the  Covid-19 declaration under Section 564(b)(1) of the Act, 21  U.S.C. section 360bbb-3(b)(1), unless the authorization is  terminated or revoked. Performed at [facility], [address] [place], [place] [address]   Peritoneal Fluid culture ( includes gram stain)     Status: None (Preliminary result)   Collection Time: 04/12/20 10:28 PM   Specimen: Peritoneal Washings; Peritoneal Fluid  Result Value Ref Range Status   Specimen Description   Final    PERITONEAL Performed at [facility], [address] [place], [place] [address]    Special Requests   Final    NONE Performed at [facility], [address] [place], [place] [address]    Gram Stain   Final    ABUNDANT WBC PRESENT, PREDOMINANTLY PMN NO ORGANISMS SEEN    Culture   Final    NO GROWTH 2 DAYS Performed at [facility], [place] [address] [place], [place] [address]    Report Status PENDING  Incomplete  Culture, blood (Routine X 2) w Reflex to ID Panel     Status: None (Preliminary result)   Collection Time: 04/13/20  6:40 PM   Specimen: BLOOD  Result Value Ref Range Status   Specimen Description BLOOD RIGHT ANTECUBITAL  Final   Special Requests   Final    BOTTLES DRAWN AEROBIC AND ANAEROBIC Blood Culture adequate volume   Culture   Final    NO GROWTH 2 DAYS Performed at [facility], [address] [place], [place] [address]    Report Status PENDING  Incomplete  C Difficile Quick Screen w PCR reflex     Status: None   Collection Time: 04/14/20 10:05 PM   Specimen: STOOL  Result Value Ref Range Status   C Diff antigen NEGATIVE NEGATIVE Final   C Diff toxin NEGATIVE NEGATIVE Final   C Diff interpretation No C. difficile detected.  Final    Comment: Performed at [facility], [address] [place], [place] [address]      Studies: No results found.  Scheduled Meds: . albuterol  2 mg Oral BID  . calcium acetate  1,334 mg Oral TID WC  . calcium-vitamin D   Oral Daily  . [START ON 04/16/2020] Chlorhexidine Gluconate Cloth  6 each Topical Q0600  . cholecalciferol  2,000 Units Oral BID  . epoetin (EPOGEN/PROCRIT) injection  10,000 Units Subcutaneous Weekly  . extraneal (ICODEXTRIN) peritoneal dialysis solution   Intraperitoneal Q24H  . fluticasone furoate-vilanterol  1 puff Inhalation Daily   And  . umeclidinium bromide  1  puff Inhalation Daily  . gentamicin cream  1 application Topical Daily  . heparin injection (subcutaneous)  5,000 Units Subcutaneous Q8H  . insulin aspart   0-5 Units Subcutaneous QHS  . insulin aspart  0-6 Units Subcutaneous TID WC  . insulin glargine  10 Units Subcutaneous QHS  . isosorbide mononitrate  30 mg Oral Daily  . [START ON 04/16/2020] levalbuterol  0.63 mg Nebulization BID  . multivitamin  1 tablet Oral QHS  . pantoprazole  40 mg Oral Daily    Continuous Infusions: . dialysis solution 2.5% low-MG/low-CA Stopped (04/13/20 1638)  . piperacillin-tazobactam (ZOSYN)  IV 2.25 g (04/14/20 1458)     LOS: 3 days     Alma Friendly, MD [facility]  If 7PM-7AM, please contact night-coverage [email or web address] 04/15/2020, 4:17 PM

## 2020-04-15 NOTE — Progress Notes (Signed)
Notified 1805 rapid response called by dialysis RN- 10/10 chest pain.  Patient had also been c/o abdominal pain 5-10/10.  Fentanyl dose order was delayed due to Pyxis med dispenser being down- administered with relief. Chest pain relieved significantly with belching.  1850 patient returned from HD- vitals stable. Multiple diarrhea stools throughout day- Cd was negative yesterday. Severe erythema, irritation on groin, testicles, buttocks.  Cleansed, applied barrier ointment. Notified MD.

## 2020-04-15 NOTE — Progress Notes (Signed)
Daily Progress Note   Patient Name: Thomas [name]       Date: 04/15/2020 DOB: [birth year]  Age: 79 y.o. MRN#: [id number] Attending Physician: Alma Friendly, MD Primary Care Physician: Medicine, [facility] Internal Admit Date: 04/12/2020  Reason for Consultation/Follow-up: Establishing goals of care  Subjective: Tells me he feels better today, more alert, mild abd pain much improved from yesterday  Length of Stay: 3  Current Medications: Scheduled Meds:  . albuterol  2 mg Oral BID  . calcium acetate  1,334 mg Oral TID WC  . calcium-vitamin D   Oral Daily  . cholecalciferol  2,000 Units Oral BID  . epoetin (EPOGEN/PROCRIT) injection  10,000 Units Subcutaneous Weekly  . extraneal (ICODEXTRIN) peritoneal dialysis solution   Intraperitoneal Q24H  . fluticasone furoate-vilanterol  1 puff Inhalation Daily   And  . umeclidinium bromide  1 puff Inhalation Daily  . gentamicin cream  1 application Topical Daily  . heparin injection (subcutaneous)  5,000 Units Subcutaneous Q8H  . insulin aspart  0-5 Units Subcutaneous QHS  . insulin aspart  0-6 Units Subcutaneous TID WC  . insulin glargine  10 Units Subcutaneous QHS  . isosorbide mononitrate  30 mg Oral Daily  . levalbuterol  0.63 mg Nebulization Q8H  . multivitamin  1 tablet Oral QHS  . pantoprazole  40 mg Oral Daily    Continuous Infusions: . dialysis solution 2.5% low-MG/low-CA Stopped (04/13/20 1638)  . piperacillin-tazobactam (ZOSYN)  IV 2.25 g (04/14/20 1458)    PRN Meds: acetaminophen **OR** acetaminophen, albuterol, calcium acetate **AND** calcium acetate, heparin, hydrALAZINE, ondansetron **OR** ondansetron (ZOFRAN) IV  Physical Exam Constitutional:      General: He is not in acute distress. Pulmonary:     Effort: Pulmonary effort is  normal.     Comments: Remains on face mask Skin:    General: Skin is warm and dry.  Neurological:     Mental Status: He is alert and oriented to person, place, and time.     Comments: Some confusion but easily reoriented             Vital Signs: BP (!) 123/46 (BP Location: Right Arm)   Pulse 78   Temp 97.7 F (36.5 C)   Resp 14   Ht 5' 9.02" (1.753 m)   Wt 96.6 kg   SpO2 95%   BMI 31.43 kg/m  SpO2: SpO2: 95 % O2 Device: O2 Device: Face Tent O2 Flow Rate: O2 Flow Rate (L/min): 6 L/min  Intake/output summary:   Intake/Output Summary (Last 24 hours) at 04/15/2020 1055 Last data filed at 04/14/2020 1500 Gross per 24 hour  Intake 446.48 ml  Output --  Net 446.48 ml   LBM: Last BM Date: 04/14/20 Baseline Weight: Weight: 102.7 kg Most recent weight: Weight: 96.6 kg       Palliative Assessment/Data: PPS 40%    Flowsheet Rows     Most Recent Value  Intake Tab  Referral Department Hospitalist  Unit at Time of Referral Med/Surg Unit  Palliative Care Primary Diagnosis Sepsis/Infectious Disease  Date Notified 04/14/20  Palliative Care Type New Palliative care  Reason for referral Clarify Goals of Care  Date of Admission 04/12/20  Date  first seen by Palliative Care 04/14/20  # of days Palliative referral response time 0 Day(s)  # of days IP prior to Palliative referral 2  Clinical Assessment  Palliative Performance Scale Score 40%  Psychosocial & Spiritual Assessment  Palliative Care Outcomes  Patient/Family meeting held? Yes  Who was at the meeting? daughter  [place] goals of care, Linked to palliative care logitudinal support      Patient Active Problem List   Diagnosis Date Noted  . Dialysis-associated peritonitis ([place])   . Goals of care, counseling/discussion   . Palliative care by specialist   . DNR (do not resuscitate)   . Spontaneous bacterial peritonitis ([place]) 04/12/2020  . Pneumonia due to COVID-19 virus 06/09/2019  .  Sepsis ([place]) 06/08/2019  . Thrombocytopenia ([place]) 06/08/2019  . Chronic respiratory failure with hypoxia ([place]) 06/08/2019  . COPD (chronic obstructive pulmonary disease) ([place])   . HTN (hypertension)   . COVID-19 virus infection   . Volume overload 01/26/2019  . Pneumonia 01/25/2019  . Generalized weakness 01/24/2019  . Congestive heart failure ([place]) 05/11/2016  . Venous stenosis of left upper extremity 05/11/2016  . End stage renal disease ([place]) 06/19/2013  . Rectal bleeding 11/27/2012  . Upper abdominal pain 07/07/2012  . Anemia 07/07/2012  . Chronic nausea 07/07/2012  . Microscopic colitis 03/07/2012  . Diarrhea of presumed infectious origin 02/01/2010  . DIVERTICULITIS, HX OF 02/01/2010  . CONSTIPATION 09/10/2008  . ABDOMINAL PAIN, LOWER 09/10/2008  . Insulin-requiring or dependent type II diabetes mellitus ([place]) 09/09/2008  . CIGARETTE SMOKER 09/09/2008  . Coronary atherosclerosis 09/09/2008  . 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 Esophageal reflux 09/09/2008  . HIATAL HERNIA 09/09/2008  . HOARSENESS 09/09/2008    Palliative Care Assessment & Plan   HPI: 79 y.o. 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  Patient apparently had some issues with his peritoneal dialysis fluid and nonsterileprocedure and exchange of fluid was performed few days ago. Patient being treated for severe sepsis likely secondary to peritonitis associated with PD. Also requiring increase in oxygen. Complications with PD during hospitalization. PMT consulted for [place].   Assessment: Mental status better today. Plans for HD today. Patient more alert but still unable to participate in [place] conversations independently. Abdominal pain much improved. Family concerned about pain medication - d/c'd fentanyl.  Spoke again with patient's daughter  Jonelle Sidle. Provided update and plan for HD today. She is agreeable to this but concerned about long term plan. She again shares that patient does not tolerate HD well and this was the reason for beginning PD ~2 months ago. She is hopeful PD access can be fixed and he can tolerate PD prior to discharge.   We also discuss disposition. She remains hopeful that he can return home with home health but also realizes he may be too weak to return home. Would like to see how he does in coming days.   Recommendations/Plan:  DNR, ongoing [place], family hopeful he can go home with home health instead of SNF  Code Status:  DNR  Prognosis:   Unable to determine  Discharge Planning:  To Be Determined  Care plan was discussed with RN and daughter  Thank you for allowing the Palliative Medicine Team to assist in the care of this patient.   Total Time 20 minutes  Prolonged Time Billed  no       Greater than 50%  of this time was spent counseling and coordinating care related to the above assessment and plan.  Juel Burrow, DNP, [facility] Palliative Medicine Team Team Phone # [phone]  Pager [phone]

## 2020-04-16 LAB — CBC WITH DIFFERENTIAL/PLATELET
Abs Immature Granulocytes: 0.13 10*3/uL — ABNORMAL HIGH (ref 0.00–0.07)
Basophils Absolute: 0 10*3/uL (ref 0.0–0.1)
Basophils Relative: 1 %
Eosinophils Absolute: 0.4 10*3/uL (ref 0.0–0.5)
Eosinophils Relative: 4 %
HCT: 30.3 % — ABNORMAL LOW (ref 39.0–52.0)
Hemoglobin: 9.9 g/dL — ABNORMAL LOW (ref 13.0–17.0)
Immature Granulocytes: 2 %
Lymphocytes Relative: 11 %
Lymphs Abs: 0.9 10*3/uL (ref 0.7–4.0)
MCH: 26.9 pg (ref 26.0–34.0)
MCHC: 32.7 g/dL (ref 30.0–36.0)
MCV: 82.3 fL (ref 80.0–100.0)
Monocytes Absolute: 0.4 10*3/uL (ref 0.1–1.0)
Monocytes Relative: 5 %
Neutro Abs: 6.8 10*3/uL (ref 1.7–7.7)
Neutrophils Relative %: 77 %
Platelets: 307 10*3/uL (ref 150–400)
RBC: 3.68 MIL/uL — ABNORMAL LOW (ref 4.22–5.81)
RDW: 18 % — ABNORMAL HIGH (ref 11.5–15.5)
WBC: 8.7 10*3/uL (ref 4.0–10.5)
nRBC: 0 % (ref 0.0–0.2)

## 2020-04-16 LAB — RENAL FUNCTION PANEL
Albumin: 2.4 g/dL — ABNORMAL LOW (ref 3.5–5.0)
Anion gap: 17 — ABNORMAL HIGH (ref 5–15)
BUN: 46 mg/dL — ABNORMAL HIGH (ref 8–23)
CO2: 26 mmol/L (ref 22–32)
Calcium: 8.5 mg/dL — ABNORMAL LOW (ref 8.9–10.3)
Chloride: 94 mmol/L — ABNORMAL LOW (ref 98–111)
Creatinine, Ser: 8.22 mg/dL — ABNORMAL HIGH (ref 0.61–1.24)
GFR, Estimated: 6 mL/min — ABNORMAL LOW (ref >60)
Glucose, Bld: 148 mg/dL — ABNORMAL HIGH (ref 70–99)
Phosphorus: 4.1 mg/dL (ref 2.5–4.6)
Potassium: 3.4 mmol/L — ABNORMAL LOW (ref 3.5–5.1)
Sodium: 137 mmol/L (ref 135–145)

## 2020-04-16 LAB — GLUCOSE, CAPILLARY
Glucose-Capillary: 121 mg/dL — ABNORMAL HIGH (ref 70–99)
Glucose-Capillary: 134 mg/dL — ABNORMAL HIGH (ref 70–99)
Glucose-Capillary: 130 mg/dL — ABNORMAL HIGH (ref 70–99)
Glucose-Capillary: 127 mg/dL — ABNORMAL HIGH (ref 70–99)

## 2020-04-16 LAB — BODY FLUID CULTURE: Culture: NO GROWTH

## 2020-04-16 LAB — VANCOMYCIN, RANDOM: Vancomycin Rm: 39

## 2020-04-16 MED ORDER — LIDOCAINE HCL URETHRAL/MUCOSAL 2 % EX GEL
1.0000 "application " | Freq: Once | CUTANEOUS | Status: AC
  Administered 2020-04-16: 1 via TOPICAL
  Filled 2020-04-16: qty 5

## 2020-04-16 MED ORDER — KETOROLAC TROMETHAMINE 30 MG/ML IJ SOLN
15.0000 mg | Freq: Once | INTRAMUSCULAR | Status: AC
  Administered 2020-04-16: 15 mg via INTRAVENOUS
  Filled 2020-04-16: qty 1

## 2020-04-16 MED ORDER — VANCOMYCIN VARIABLE DOSE PER UNSTABLE RENAL FUNCTION (PHARMACIST DOSING): Status: DC

## 2020-04-16 NOTE — Progress Notes (Signed)
PROGRESS NOTE  Thomas [name]:[id number] DOB: 11/19/1940 DOA: 04/12/2020 PCP: Medicine, [facility]  HPI/Recap of past 24 hours: HPI from Dr [name] is a 79 y.o. male with medical history significant of end-stage renal disease on peritoneal dialysis, COPD, diverticular disease, hypertension, COVID-19 infection in January of this year, hyperlipidemia, hypothyroidism, nasal mass status post nose removal, history of subdural hematoma and obstructive sleep apnea oxygen dependent with facemask who was brought in with abdominal pain fever and chills.  Patient apparently had some issues with his peritoneal dialysis fluid and nonsterile procedure and exchange of fluid was performed few days ago. Started having significant abdominal pain altered mental status and fever.  Patient admitted for further management.  In the ER, patient was noted to have diffuse abdominal tenderness with rebound consistent with peritonitis.  Aspiration of his peritoneal fluid showed cloudy fluid which has been sent to the lab, peritoneal fluid culture and blood cultures have been taken, started on antibiotics with suspected spontaneous bacterial peritonitis.  Noted to have temperature 100.9 blood pressure 166/81 pulse 110 respiratory rate of 22 oxygen sat 90%.  Sodium is 134 potassium 4.3 chloride of 89 CO2 26 glucose 154 BUN 59 creatinine 9.72 and a gap of 19.  Troponin is 24 lactic acid 2.4 white count 14.2 hemoglobin 11.1 platelets of 297.  COVID-19 screen is negative.      Today, patient denies any worsening abdominal pain, although abdomen still distended, denies any nausea/vomiting, fever/chills, chest pain, shortness of breath.   Assessment/Plan: Principal Problem:   Sepsis ([place]) Active Problems:   Coronary atherosclerosis   Esophageal reflux   End stage renal disease (HCC)   COPD (chronic obstructive pulmonary disease) (HCC)   Spontaneous bacterial peritonitis ([place])   Dialysis-associated  peritonitis (HCC)   Goals of care, counseling/discussion   Palliative care by specialist   DNR (do not resuscitate)   Severe sepsis likely 2/2 peritonitis associated with PD On admission, temp 100.9, heart rate 110, respiratory rate 22, WBC 14.2, lactic acid 2.4 with altered mental status Likely complication from peritoneal dialysis Currently afebrile, with resolved leukocytosis Lactic acid, procalcitonin elevated (worsened likely due to ESRD, not getting dialysis) BC x2 NGTD Peritoneal fluid analysis culture NGTD Continue IV Zosyn, vancomycin Monitor closely  Acute on chronic hypoxic respiratory failure Possible CAP History of COPD Patient noted to be tachypneic, requiring about 6 L of O2 via facemask, 5 L at home Chest x-ray showing multifocal left lung infiltrate, with small associated parapneumonic effusion Continue IV Zosyn, vancomycin for now, duo nebs, inhaler Supplemental O2  ESRD On PD, PD catheter malfunction, nephrology plan to switch HD, first session done on 04/15/20, next on 04/18/20 Nephrology consulted  Hypokalemia Correction per HD  Anemia of chronic kidney disease/iron deficiency Baseline hemoglobin around 12 Anemia panel showed iron 17, sats 12, ferritin 796 On weekly Epogen Daily CBC, monitor for any signs of bleeding  Hypertension Stable Continue home Imdur, hydralazine as needed  Diabetes mellitus type 2 A1c on 04/13/2020 was 7.5 SSI, Accu-Cheks, Lantus, hypoglycemic protocol  GERD Continue PPI  OSA Continue oxygen via facemask  Obesity Lifestyle modification advised  Goals of care discussion Patient with multiple comorbidities, poor prognosis Palliative consult for further goals of care discussion, now made DNR      Malnutrition Type:      Malnutrition Characteristics:      Nutrition Interventions:       Estimated body mass index is 30.04 kg/m as calculated from the following:  Height as of this encounter: 5' 9.02"  (1.753 m).   Weight as of this encounter: 92.3 kg.     Code Status: Full  Family Communication: Discussed with daughter on 04/15/20  Disposition Plan: Status is: Inpatient  Remains inpatient appropriate because:Inpatient level of care appropriate due to severity of illness   Dispo: The patient is from: Home              Anticipated d/c is to: SNF              Anticipated d/c date is: 2 days              Patient currently is not medically stable to d/c.    Consultants:  Nephrology  Procedures:  None  Antimicrobials:  IV Zosyn   Vancomycin  DVT prophylaxis: Heparin [place]   Objective: Vitals:   04/15/20 2356 04/16/20 0454 04/16/20 0500 04/16/20 1142  BP: 123/66 (!) 140/44  (!) 154/61  Pulse: 89 81  76  Resp: 20 16  16   Temp: 97.6 F (36.4 C) 98.2 F (36.8 C)  98.6 F (37 C)  TempSrc: Oral Oral  Oral  SpO2: 100% 95%  98%  Weight:   92.3 kg   Height:        Intake/Output Summary (Last 24 hours) at 04/16/2020 1526 Last data filed at 04/16/2020 1300 Gross per 24 hour  Intake 154 ml  Output 750 ml  Net -596 ml   Filed Weights   04/14/20 0500 04/15/20 0509 04/16/20 0500  Weight: 96.9 kg 96.6 kg 92.3 kg    Exam:  General:  Absent nose s/p surgery, chronically ill-appearing  Cardiovascular: S1, S2 present  Respiratory:  Diminished breath sounds bilaterally  Abdomen: Soft, non-tender, distended, bowel sounds present  Musculoskeletal: No bilateral pedal edema noted  Skin: Normal  Psychiatry:  Normal mood   Data Reviewed: CBC: Recent Labs  Lab 04/12/20 2133 04/13/20 0300 04/14/20 0416 04/15/20 0448 04/16/20 0408  WBC 14.2* 17.5* 15.8* 12.1* 8.7  NEUTROABS 13.0*  --  13.3* 9.9* 6.8  HGB 11.0* 10.0* 10.6* 11.4* 9.9*  HCT 33.7* 31.3* 33.3* 35.1* 30.3*  MCV 83.6 84.1 84.1 83.2 82.3  PLT 297 266 264 302 081   Basic Metabolic Panel: Recent Labs  Lab 04/12/20 2133 04/13/20 0119 04/13/20 0300 04/14/20 0416 04/15/20 0448 04/16/20 0408    NA 134*  --  132* 132* 131* 137  K 4.3  --  4.3 3.7 3.1* 3.4*  CL 89*  --  92* 88* 85* 94*  CO2 26  --  22 28 27 26   GLUCOSE 154*  --  165* 211* 145* 148*  BUN 59*  --  60* 62* 73* 46*  CREATININE 9.72*  --  9.57* 9.42* 10.62* 8.22*  CALCIUM 9.0  --  8.3* 8.3* 8.1* 8.5*  MG  --  1.7  --   --   --   --   PHOS  --   --   --  4.9* 6.0* 4.1   GFR: Estimated Creatinine Clearance: 8.2 mL/min (A) (by C-G formula based on SCr of 8.22 mg/dL (H)). Liver Function Tests: Recent Labs  Lab 04/12/20 2133 04/13/20 0300 04/14/20 0416 04/15/20 0448 04/16/20 0408  AST 19 17  --   --   --   ALT 15 13  --   --   --   ALKPHOS 137* 105  --   --   --   BILITOT 0.7 0.9  --   --   --  PROT 7.2 6.2*  --   --   --   ALBUMIN 2.9* 2.5* 2.5* 2.2* 2.4*   No results for input(s): LIPASE, AMYLASE in the last 168 hours. No results for input(s): AMMONIA in the last 168 hours. Coagulation Profile: Recent Labs  Lab 04/13/20 0300  INR 1.1   Cardiac Enzymes: No results for input(s): CKTOTAL, CKMB, CKMBINDEX, TROPONINI in the last 168 hours. BNP (last 3 results) No results for input(s): PROBNP in the last 8760 hours. HbA1C: No results for input(s): HGBA1C in the last 72 hours. CBG: Recent Labs  Lab 04/15/20 1208 04/15/20 1605 04/15/20 2032 04/16/20 0740 04/16/20 1138  GLUCAP 145* 144* 117* 121* 134*   Lipid Profile: No results for input(s): CHOL, HDL, LDLCALC, TRIG, CHOLHDL, LDLDIRECT in the last 72 hours. Thyroid Function Tests: No results for input(s): TSH, T4TOTAL, FREET4, T3FREE, THYROIDAB in the last 72 hours. Anemia Panel: Recent Labs    04/14/20 0416  VITAMINB12 1,492*  FOLATE 44.0  FERRITIN 796*  TIBC 139*  IRON 17*   Urine analysis:    Component Value Date/Time   COLORURINE YELLOW 06/15/2008 1856   APPEARANCEUR CLEAR 06/15/2008 1856   LABSPEC >1.030 (H) 06/15/2008 1856   PHURINE 5.0 06/15/2008 1856   GLUCOSEU 250 06/29/2012 1308   HGBUR TRACE (A) 06/15/2008 1856    BILIRUBINUR SMALL (A) 06/15/2008 1856   KETONESUR TRACE (A) 06/15/2008 1856   PROTEINUR >300 (A) 06/15/2008 1856   UROBILINOGEN 0.2 06/15/2008 1856   NITRITE NEGATIVE 06/15/2008 1856   LEUKOCYTESUR NEGATIVE 06/15/2008 1856   Sepsis Labs: @LABRCNTIP (procalcitonin:4,lacticidven:4)  ) Recent Results (from the past 240 hour(s))  Culture, blood (routine x 2)     Status: None (Preliminary result)   Collection Time: 04/12/20  9:33 PM   Specimen: BLOOD  Result Value Ref Range Status   Specimen Description BLOOD RIGHT ASSIST CONTROL  Final   Special Requests   Final    BOTTLES DRAWN AEROBIC AND ANAEROBIC Blood Culture results may not be optimal due to an inadequate volume of blood received in culture bottles   Culture   Final    NO GROWTH 4 DAYS Performed at [facility], [address], [place], [place] [address]    Report Status PENDING  Incomplete  Respiratory Panel by RT PCR (Flu A&B, Covid) - Nasopharyngeal Swab     Status: None   Collection Time: 04/12/20  9:38 PM   Specimen: Nasopharyngeal Swab  Result Value Ref Range Status   SARS Coronavirus 2 by RT PCR NEGATIVE NEGATIVE Final    Comment: (NOTE) SARS-CoV-2 target nucleic acids are NOT DETECTED.  The SARS-CoV-2 RNA is generally detectable in upper respiratoy specimens during the acute phase of infection. The lowest concentration of SARS-CoV-2 viral copies this assay can detect is 131 copies/mL. A negative result does not preclude SARS-Cov-2 infection and should not be used as the sole basis for treatment or other patient management decisions. A negative result may occur with  improper specimen collection/handling, submission of specimen other than nasopharyngeal swab, presence of viral mutation(s) within the areas targeted by this assay, and inadequate number of viral copies (<131 copies/mL). A negative result must be combined with clinical observations, patient history, and epidemiological information.  The expected result is Negative.  Fact Sheet for Patients:  PinkCheek.be  Fact Sheet for Healthcare Providers:  GravelBags.it  This test is no t yet approved or cleared by the [place] FDA and  has been authorized for detection and/or diagnosis of SARS-CoV-2 by  FDA under an Emergency Use Authorization (EUA). This EUA will remain  in effect (meaning this test can be used) for the duration of the COVID-19 declaration under Section 564(b)(1) of the Act, 21 U.S.C. section 360bbb-3(b)(1), unless the authorization is terminated or revoked sooner.     Influenza A by PCR NEGATIVE NEGATIVE Final   Influenza B by PCR NEGATIVE NEGATIVE Final    Comment: (NOTE) The Xpert Xpress SARS-CoV-2/FLU/RSV assay is intended as an aid in  the diagnosis of influenza from Nasopharyngeal swab specimens and  should not be used as a sole basis for treatment. Nasal washings and  aspirates are unacceptable for Xpert Xpress SARS-CoV-2/FLU/RSV  testing.  Fact Sheet for Patients: PinkCheek.be  Fact Sheet for Healthcare Providers: GravelBags.it  This test is not yet approved or cleared by the [place] FDA and  has been authorized for detection and/or diagnosis of SARS-CoV-2 by  FDA under an Emergency Use Authorization (EUA). This EUA will remain  in effect (meaning this test can be used) for the duration of the  Covid-19 declaration under Section 564(b)(1) of the Act, 21  U.S.C. section 360bbb-3(b)(1), unless the authorization is  terminated or revoked. Performed at [facility], [address] [place], [place] [address]   Peritoneal Fluid culture ( includes gram stain)     Status: None   Collection Time: 04/12/20 10:28 PM   Specimen: Peritoneal Washings; Peritoneal Fluid  Result Value Ref Range Status   Specimen Description   Final    PERITONEAL Performed at [facility], [address], [place], [place] [address]    Special Requests   Final    NONE Performed at [facility], [address] [place], [place] [address]    Gram Stain   Final    ABUNDANT WBC PRESENT, PREDOMINANTLY PMN NO ORGANISMS SEEN    Culture   Final    NO GROWTH Performed at [facility], [place] [address], [place], [place] [address]    Report Status 04/16/2020 FINAL  Final  Culture, blood (Routine X 2) w Reflex to ID Panel     Status: None (Preliminary result)   Collection Time: 04/13/20  6:40 PM   Specimen: BLOOD  Result Value Ref Range Status   Specimen Description BLOOD RIGHT ANTECUBITAL  Final   Special Requests   Final    BOTTLES DRAWN AEROBIC AND ANAEROBIC Blood Culture adequate volume   Culture   Final    NO GROWTH 3 DAYS Performed at [facility], [address], [place], [place] [address]    Report Status PENDING  Incomplete  C Difficile Quick Screen w PCR reflex     Status: None   Collection Time: 04/14/20 10:05 PM   Specimen: STOOL  Result Value Ref Range Status   C Diff antigen NEGATIVE NEGATIVE Final   C Diff toxin NEGATIVE NEGATIVE Final   C Diff interpretation No C. difficile detected.  Final    Comment: Performed at [facility], [address] [place],  [address]      Studies: No results found.  Scheduled Meds: . albuterol  2 mg Oral BID  . calcium acetate  1,334 mg Oral TID WC  . calcium-vitamin D   Oral Daily  . Chlorhexidine Gluconate Cloth  6 each Topical Q0600  . cholecalciferol  2,000 Units Oral BID  . epoetin (EPOGEN/PROCRIT) injection  10,000 Units Subcutaneous Weekly  . fluticasone furoate-vilanterol  1 puff Inhalation Daily   And  .  umeclidinium bromide  1 puff Inhalation Daily  . gentamicin cream  1 application Topical Daily  . heparin injection (subcutaneous)  5,000 Units Subcutaneous Q8H  . insulin aspart  0-5 Units Subcutaneous QHS  . insulin aspart  0-6 Units Subcutaneous  TID WC  . insulin glargine  10 Units Subcutaneous QHS  . isosorbide mononitrate  30 mg Oral Daily  . levalbuterol  0.63 mg Nebulization BID  . multivitamin  1 tablet Oral QHS  . pantoprazole  40 mg Oral Daily  . vancomycin variable dose per unstable renal function (pharmacist dosing)   Does not apply See admin instructions    Continuous Infusions: . piperacillin-tazobactam (ZOSYN)  IV 2.25 g (04/16/20 1245)     LOS: 4 days     Alma Friendly, MD [facility]  If 7PM-7AM, please contact night-coverage [email or web address] 04/16/2020, 3:26 PM

## 2020-04-16 NOTE — Plan of Care (Signed)
Continuing with plan of care. 

## 2020-04-16 NOTE — Progress Notes (Signed)
[place], [place] 04/16/20  Subjective:   LOS: 4 Thomas [name] is a 79 y.o. male with medical history of end-stage renal disease on peritoneal dialysis, COPD, diverticular disease, hypertension, COVID-19 infection in January 2021, hyperlipidemia, hypothyroidism, nasal mass status post removal, and obstructive sleep apnea oxygen dependent with facemask who presents to ED for altered mental status.  Nephrology was referred for recommendations of ESRD on PD.  Peritoneal dialysis catheter was not working. Therefore underwent hemodialysis yesterday.    Objective:  Vital signs in last 24 hours:  Temp:  [97 F (36.1 C)-98.6 F (37 C)] 98.6 F (37 C) (11/13 1142) Pulse Rate:  [76-89] 76 (11/13 1142) Resp:  [16-20] 16 (11/13 1142) BP: (122-154)/(44-70) 154/61 (11/13 1142) SpO2:  [95 %-100 %] 98 % (11/13 1142) Weight:  [92.3 kg] 92.3 kg (11/13 0500)  Weight change: -4.3 kg Filed Weights   04/14/20 0500 04/15/20 0509 04/16/20 0500  Weight: 96.9 kg 96.6 kg 92.3 kg    Intake/Output:    Intake/Output Summary (Last 24 hours) at 04/16/2020 1438 Last data filed at 04/16/2020 1300 Gross per 24 hour  Intake 154 ml  Output 750 ml  Net -596 ml     Physical Exam: General: Resting in bed, in no acute distress  HEENT Rhinectomy, turbinates visible and pink  Pulm/lungs basilar rales, normal effort  CVS/Heart Regular rate and rhythm  Abdomen:  Distended +   Extremities: Trace peripheral edema  Neurologic: Oriented x 3, speech clear and appropriate  Skin: No rashes or lesions noted  Access: Peritoneal catheter with dressing clean,dry and intact   LUA AVF +bruit,+thrill    Basic Metabolic Panel:  Recent Labs  Lab 04/12/20 2133 04/12/20 2133 04/13/20 0119 04/13/20 0300 04/13/20 0300 04/14/20 0416 04/15/20 0448 04/16/20 0408  NA 134*  --   --  132*  --  132* 131* 137  K 4.3  --   --  4.3  --  3.7 3.1* 3.4*  CL 89*  --   --  92*  --  88* 85*  94*  CO2 26  --   --  22  --  28 27 26   GLUCOSE 154*  --   --  165*  --  211* 145* 148*  BUN 59*  --   --  60*  --  62* 73* 46*  CREATININE 9.72*  --   --  9.57*  --  9.42* 10.62* 8.22*  CALCIUM 9.0   < >  --  8.3*   < > 8.3* 8.1* 8.5*  MG  --   --  1.7  --   --   --   --   --   PHOS  --   --   --   --   --  4.9* 6.0* 4.1   < > = values in this interval not displayed.     CBC: Recent Labs  Lab 04/12/20 2133 04/13/20 0300 04/14/20 0416 04/15/20 0448 04/16/20 0408  WBC 14.2* 17.5* 15.8* 12.1* 8.7  NEUTROABS 13.0*  --  13.3* 9.9* 6.8  HGB 11.0* 10.0* 10.6* 11.4* 9.9*  HCT 33.7* 31.3* 33.3* 35.1* 30.3*  MCV 83.6 84.1 84.1 83.2 82.3  PLT 297 266 264 302 307      Lab Results  Component Value Date   HEPBSAG Negative 01/25/2019      Microbiology:  Recent Results (from the past 240 hour(s))  Culture, blood (routine x 2)     Status: None (Preliminary result)  Collection Time: 04/12/20  9:33 PM   Specimen: BLOOD  Result Value Ref Range Status   Specimen Description BLOOD RIGHT ASSIST CONTROL  Final   Special Requests   Final    BOTTLES DRAWN AEROBIC AND ANAEROBIC Blood Culture results may not be optimal due to an inadequate volume of blood received in culture bottles   Culture   Final    NO GROWTH 4 DAYS Performed at [facility], [address], [place], [place] [address]    Report Status PENDING  Incomplete  Respiratory Panel by RT PCR (Flu A&B, Covid) - Nasopharyngeal Swab     Status: None   Collection Time: 04/12/20  9:38 PM   Specimen: Nasopharyngeal Swab  Result Value Ref Range Status   SARS Coronavirus 2 by RT PCR NEGATIVE NEGATIVE Final    Comment: (NOTE) SARS-CoV-2 target nucleic acids are NOT DETECTED.  The SARS-CoV-2 RNA is generally detectable in upper respiratoy specimens during the acute phase of infection. The lowest concentration of SARS-CoV-2 viral copies this assay can detect is 131 copies/mL. A negative result does not preclude  SARS-Cov-2 infection and should not be used as the sole basis for treatment or other patient management decisions. A negative result may occur with  improper specimen collection/handling, submission of specimen other than nasopharyngeal swab, presence of viral mutation(s) within the areas targeted by this assay, and inadequate number of viral copies (<131 copies/mL). A negative result must be combined with clinical observations, patient history, and epidemiological information. The expected result is Negative.  Fact Sheet for Patients:  PinkCheek.be  Fact Sheet for Healthcare Providers:  GravelBags.it  This test is no t yet approved or cleared by the [place] FDA and  has been authorized for detection and/or diagnosis of SARS-CoV-2 by FDA under an Emergency Use Authorization (EUA). This EUA will remain  in effect (meaning this test can be used) for the duration of the COVID-19 declaration under Section 564(b)(1) of the Act, 21 U.S.C. section 360bbb-3(b)(1), unless the authorization is terminated or revoked sooner.     Influenza A by PCR NEGATIVE NEGATIVE Final   Influenza B by PCR NEGATIVE NEGATIVE Final    Comment: (NOTE) The Xpert Xpress SARS-CoV-2/FLU/RSV assay is intended as an aid in  the diagnosis of influenza from Nasopharyngeal swab specimens and  should not be used as a sole basis for treatment. Nasal washings and  aspirates are unacceptable for Xpert Xpress SARS-CoV-2/FLU/RSV  testing.  Fact Sheet for Patients: PinkCheek.be  Fact Sheet for Healthcare Providers: GravelBags.it  This test is not yet approved or cleared by the [place] FDA and  has been authorized for detection and/or diagnosis of SARS-CoV-2 by  FDA under an Emergency Use Authorization (EUA). This EUA will remain  in effect (meaning this test can be used) for the duration of the   Covid-19 declaration under Section 564(b)(1) of the Act, 21  U.S.C. section 360bbb-3(b)(1), unless the authorization is  terminated or revoked. Performed at [facility], [address] [place], [place] [address]   Peritoneal Fluid culture ( includes gram stain)     Status: None   Collection Time: 04/12/20 10:28 PM   Specimen: Peritoneal Washings; Peritoneal Fluid  Result Value Ref Range Status   Specimen Description   Final    PERITONEAL Performed at [facility], [address], [place], [place] [address]    Special Requests   Final    NONE Performed at [facility], [place],  [place] [address]    Gram Stain   Final    ABUNDANT WBC PRESENT, PREDOMINANTLY PMN NO ORGANISMS SEEN    Culture   Final    NO GROWTH Performed at [facility], [place] [address] [place], [place] [address]    Report Status 04/16/2020 FINAL  Final  Culture, blood (Routine X 2) w Reflex to ID Panel     Status: None (Preliminary result)   Collection Time: 04/13/20  6:40 PM   Specimen: BLOOD  Result Value Ref Range Status   Specimen Description BLOOD RIGHT ANTECUBITAL  Final   Special Requests   Final    BOTTLES DRAWN AEROBIC AND ANAEROBIC Blood Culture adequate volume   Culture   Final    NO GROWTH 3 DAYS Performed at [facility], [address] [place], [place] [address]    Report Status PENDING  Incomplete  C Difficile Quick Screen w PCR reflex     Status: None   Collection Time: 04/14/20 10:05 PM   Specimen: STOOL  Result Value Ref Range Status   C Diff antigen NEGATIVE NEGATIVE Final   C Diff toxin NEGATIVE NEGATIVE Final   C Diff interpretation No C. difficile detected.  Final    Comment: Performed at [facility], [address] [place] [address]    Coagulation Studies: No results for input(s): LABPROT, INR in the last 72 hours.  Urinalysis: No results for input(s): COLORURINE, LABSPEC, PHURINE, GLUCOSEU,  HGBUR, BILIRUBINUR, KETONESUR, PROTEINUR, UROBILINOGEN, NITRITE, LEUKOCYTESUR in the last 72 hours.  Invalid input(s): APPERANCEUR    Imaging: No results found.   Medications:   . piperacillin-tazobactam (ZOSYN)  IV 2.25 g (04/16/20 1245)   . albuterol  2 mg Oral BID  . calcium acetate  1,334 mg Oral TID WC  . calcium-vitamin D   Oral Daily  . Chlorhexidine Gluconate Cloth  6 each Topical Q0600  . cholecalciferol  2,000 Units Oral BID  . epoetin (EPOGEN/PROCRIT) injection  10,000 Units Subcutaneous Weekly  . fluticasone furoate-vilanterol  1 puff Inhalation Daily   And  . umeclidinium bromide  1 puff Inhalation Daily  . gentamicin cream  1 application Topical Daily  . heparin injection (subcutaneous)  5,000 Units Subcutaneous Q8H  . insulin aspart  0-5 Units Subcutaneous QHS  . insulin aspart  0-6 Units Subcutaneous TID WC  . insulin glargine  10 Units Subcutaneous QHS  . isosorbide mononitrate  30 mg Oral Daily  . levalbuterol  0.63 mg Nebulization BID  . multivitamin  1 tablet Oral QHS  . pantoprazole  40 mg Oral Daily  . vancomycin variable dose per unstable renal function (pharmacist dosing)   Does not apply See admin instructions   acetaminophen **OR** acetaminophen, albuterol, calcium acetate **AND** calcium acetate, heparin, hydrALAZINE, ondansetron **OR** ondansetron (ZOFRAN) IV  Assessment/ Plan:  79 y.o. male with ESRD on PD, COPD, Diverticular disease, HTN, Covid 19 infection in Jan 2021, HLD, hypothyroidism, nasal mass removal, h/o Sub dural hematoma, OSA was admitted on 04/12/2020 for  Principal Problem:   Sepsis [facility]) Active Problems:   Coronary atherosclerosis   Esophageal reflux   End stage renal disease (HCC)   COPD (chronic obstructive pulmonary disease) ([place])   Spontaneous bacterial peritonitis ([place])   Dialysis-associated peritonitis ([place])   Goals of care, counseling/discussion   Palliative care by specialist   DNR (do not resuscitate)  Peritonitis  associated with peritoneal dialysis, initial encounter ([place]) [T85.71XA] Sepsis ([place]) [A41.9] Altered mental status, unspecified altered  mental status type [R41.82] Sepsis without acute organ dysfunction, due to unspecified organism ([place]) [A41.9]  #. ESRD Patient had issues with peritoneal dialysis access.  Issues noted with draining.  We will likely need to readdress this as an outpatient as currently more senior PD nurses unavailable.  Underwent hemodialysis yesterday.  Next hemodialysis treatment scheduled for Monday.  # Acute Peritonitis Abdominal pain better better today, but has significant distension Maintain on vancomycin and Zosyn.  #. Anemia of CKD  Lab Results  Component Value Date   HGB 9.9 (L) 04/16/2020   Will continue Epogen weekly  #. Secondary hyperparathyroidism of renal origin N 25.81      Component Value Date/Time   PTH 218 (H) 01/26/2019 1228   Lab Results  Component Value Date   PHOS 4.1 04/16/2020  Continue calcium acetate 2 tablets p.o. 3 times daily with meals.  #. Diabetes type 2 with CKD Hgb A1c MFr Bld (%)  Date Value  04/13/2020 7.5 (H)  Blood glucose readings within acceptable range  #Hyponatremia Serum sodium up to 137 after reinitiating hemodialysis.  #Hypokalemia  Potassium up to 3.4 as compared to yesterday.   LOS: 4 Thomas [name] 11/13/20212:38 PM  [place], [place]

## 2020-04-16 NOTE — Progress Notes (Addendum)
Pharmacy Antibiotic Note  Thomas [name] is a 79 y.o. male admitted on 04/12/2020 with spontaneous bacterial peritonitis.  Pharmacy was consulted for vancomycin  dosing. 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 There were some issues with PD again last night. It's unclear if there was any filtration during the two failed PD sessions. Because of this he is scheduled for HD today. The patient is noted to be non-anuric. He has had 2 previous vancomycin doses of 2000 mg on 11/9 and 11/11  -pt on Zosyn 2.25gm IV q8h (HD dosing)  (poss. SBP)  Plan:  Day 4 Vanc-  Vanc level post HD yesterday 11/12 @2018 = 39 mcg/ml. Will hold vancomycin again today with HD as patient also did not get full HD session.   Vancomycin level after next HD  Goal vancomycin level 15 - 25 mcg/mL  Vancomycin 1000 mg after each HD when level is in range  Assess vancomycin daily based on HD/PD needs   Subsequent vancomycin levels as clinically indicated    Height: 5' 9.02" (175.3 cm) Weight: 92.3 kg (203 lb 7.8 oz) IBW/kg (Calculated) : 70.74  Temp (24hrs), Avg:97.7 F (36.5 C), Min:97 F (36.1 C), Max:98.2 F (36.8 C)  Recent Labs  Lab 04/12/20 2133 04/13/20 0119 04/13/20 0300 04/14/20 0416 04/15/20 0448 04/15/20 2018 04/16/20 0408  WBC 14.2*  --  17.5* 15.8* 12.1*  --  8.7  CREATININE 9.72*  --  9.57* 9.42* 10.62*  --  8.22*  LATICACIDVEN 2.4* 2.4*  --  2.1*  --   --   --   VANCORANDOM  --   --   --   --   --  39  --     Estimated Creatinine Clearance: 8.2 mL/min (A) (by C-G formula based on SCr of 8.22 mg/dL (H)).    Antimicrobials this admission: 11/9 Cefepime>>11/10 11/10 Flagyl>> 11/10 11/10 pip/tazo >> 11/10 vanc >>  Microbiology results: 11/9 BCx: NG x 4 days 11/9 peritoneal fluid Cx: NG x 2 days 11/1 C diff: negative 11/9 SARS CoV-2: negative 11/9 influenza A/B: negative   Thank you for allowing pharmacy to be a part of this patient's care.  Noralee Space, PharmD,  BCPS Clinical Pharmacist 04/16/2020 7:26 AM

## 2020-04-16 NOTE — Progress Notes (Signed)
Patient had erythema and irritation to the groin, testicles, and buttocks due to diarrhea. A rectal tube was put in per NP Ouma order. Barrier cream applied and lidocaine. Patient continues to be very sensitive when wiping skin. Output for rectal tube was 750 ml at end of shift.

## 2020-04-17 LAB — CBC WITH DIFFERENTIAL/PLATELET
Abs Immature Granulocytes: 0.22 10*3/uL — ABNORMAL HIGH (ref 0.00–0.07)
Basophils Absolute: 0.1 10*3/uL (ref 0.0–0.1)
Basophils Relative: 1 %
Eosinophils Absolute: 0.6 10*3/uL — ABNORMAL HIGH (ref 0.0–0.5)
Eosinophils Relative: 7 %
HCT: 33.3 % — ABNORMAL LOW (ref 39.0–52.0)
Hemoglobin: 10.6 g/dL — ABNORMAL LOW (ref 13.0–17.0)
Immature Granulocytes: 3 %
Lymphocytes Relative: 15 %
Lymphs Abs: 1.2 10*3/uL (ref 0.7–4.0)
MCH: 26.6 pg (ref 26.0–34.0)
MCHC: 31.8 g/dL (ref 30.0–36.0)
MCV: 83.5 fL (ref 80.0–100.0)
Monocytes Absolute: 0.5 10*3/uL (ref 0.1–1.0)
Monocytes Relative: 7 %
Neutro Abs: 5.2 10*3/uL (ref 1.7–7.7)
Neutrophils Relative %: 67 %
Platelets: 318 10*3/uL (ref 150–400)
RBC: 3.99 MIL/uL — ABNORMAL LOW (ref 4.22–5.81)
RDW: 17.6 % — ABNORMAL HIGH (ref 11.5–15.5)
WBC: 7.7 10*3/uL (ref 4.0–10.5)
nRBC: 0 % (ref 0.0–0.2)

## 2020-04-17 LAB — GLUCOSE, CAPILLARY
Glucose-Capillary: 121 mg/dL — ABNORMAL HIGH (ref 70–99)
Glucose-Capillary: 121 mg/dL — ABNORMAL HIGH (ref 70–99)
Glucose-Capillary: 150 mg/dL — ABNORMAL HIGH (ref 70–99)
Glucose-Capillary: 189 mg/dL — ABNORMAL HIGH (ref 70–99)

## 2020-04-17 LAB — RENAL FUNCTION PANEL
Albumin: 2.3 g/dL — ABNORMAL LOW (ref 3.5–5.0)
Anion gap: 18 — ABNORMAL HIGH (ref 5–15)
BUN: 55 mg/dL — ABNORMAL HIGH (ref 8–23)
CO2: 24 mmol/L (ref 22–32)
Calcium: 8.6 mg/dL — ABNORMAL LOW (ref 8.9–10.3)
Chloride: 96 mmol/L — ABNORMAL LOW (ref 98–111)
Creatinine, Ser: 9.56 mg/dL — ABNORMAL HIGH (ref 0.61–1.24)
GFR, Estimated: 5 mL/min — ABNORMAL LOW (ref >60)
Glucose, Bld: 123 mg/dL — ABNORMAL HIGH (ref 70–99)
Phosphorus: 4.7 mg/dL — ABNORMAL HIGH (ref 2.5–4.6)
Potassium: 3.5 mmol/L (ref 3.5–5.1)
Sodium: 138 mmol/L (ref 135–145)

## 2020-04-17 LAB — CULTURE, BLOOD (ROUTINE X 2): Culture: NO GROWTH

## 2020-04-17 MED ORDER — SODIUM CHLORIDE 0.9 % IV SOLN
INTRAVENOUS | Status: DC | PRN
  Administered 2020-04-17: 250 mL via INTRAVENOUS

## 2020-04-17 NOTE — Progress Notes (Signed)
Ch visited with Pt in response to OR for support because Pt feels lonely. Ch introduced, self, and Pt says "they may not have told you about me then". Ch asked what it was, and Pt replied "I am a fundamental faith believer." He explained to Ch that he believes that women should not preach. Ch told Pt "but I don't preach." Pt asked if Ch pastors a church; Ch responded that she doesn't. Ch said that as a chaplain she supports patients in ways they prefer, and pray with them if they like. Pt requested Ch to pray that he would be discharged sooner than the hospital staff are stating. Ch prayed with him. Pt was grateful for prayer.

## 2020-04-17 NOTE — Progress Notes (Signed)
[place], [place] 04/17/20  Subjective:   LOS: 5 Thomas [name] is a 79 y.o. male with medical history of end-stage renal disease on peritoneal dialysis, COPD, diverticular disease, hypertension, COVID-19 infection in January 2021, hyperlipidemia, hypothyroidism, nasal mass status post removal, and obstructive sleep apnea oxygen dependent with facemask who presents to ED for altered mental status.  Nephrology was referred for recommendations of ESRD on PD.  Patient last had dialysis treatment on Friday. Peritoneal dialysis catheter not working therefore he underwent hemodialysis.    Objective:  Vital signs in last 24 hours:  Temp:  [97.6 F (36.4 C)-98.5 F (36.9 C)] 97.8 F (36.6 C) (11/14 1250) Pulse Rate:  [78-92] 92 (11/14 1250) Resp:  [16-20] 20 (11/14 1250) BP: (143-171)/(50-65) 171/61 (11/14 1250) SpO2:  [93 %-99 %] 93 % (11/14 1250) Weight:  [93.4 kg] 93.4 kg (11/14 0500)  Weight change: 1.1 kg Filed Weights   04/15/20 0509 04/16/20 0500 04/17/20 0500  Weight: 96.6 kg 92.3 kg 93.4 kg    Intake/Output:    Intake/Output Summary (Last 24 hours) at 04/17/2020 1317 Last data filed at 04/17/2020 0700 Gross per 24 hour  Intake 50 ml  Output 90 ml  Net -40 ml     Physical Exam: General: Resting in bed, in no acute distress  HEENT Rhinectomy, turbinates visible and pink  Pulm/lungs basilar rales, normal effort  CVS/Heart Regular rate and rhythm  Abdomen:  Distended +   Extremities: Trace peripheral edema  Neurologic: Oriented x 3, speech clear and appropriate  Skin: No rashes or lesions noted  Access: Peritoneal catheter with dressing clean,dry and intact   LUA AVF +bruit,+thrill    Basic Metabolic Panel:  Recent Labs  Lab 04/12/20 2133 04/13/20 0119 04/13/20 0300 04/13/20 0300 04/14/20 0416 04/14/20 0416 04/15/20 0448 04/16/20 0408 04/17/20 0629  NA   < >  --  132*  --  132*  --  131* 137 138  K   < >  --  4.3  --   3.7  --  3.1* 3.4* 3.5  CL   < >  --  92*  --  88*  --  85* 94* 96*  CO2   < >  --  22  --  28  --  27 26 24   GLUCOSE   < >  --  165*  --  211*  --  145* 148* 123*  BUN   < >  --  60*  --  62*  --  73* 46* 55*  CREATININE   < >  --  9.57*  --  9.42*  --  10.62* 8.22* 9.56*  CALCIUM   < >  --  8.3*   < > 8.3*   < > 8.1* 8.5* 8.6*  MG  --  1.7  --   --   --   --   --   --   --   PHOS  --   --   --   --  4.9*  --  6.0* 4.1 4.7*   < > = values in this interval not displayed.     CBC: Recent Labs  Lab 04/12/20 2133 04/12/20 2133 04/13/20 0300 04/14/20 0416 04/15/20 0448 04/16/20 0408 04/17/20 0629  WBC 14.2*   < > 17.5* 15.8* 12.1* 8.7 7.7  NEUTROABS 13.0*  --   --  13.3* 9.9* 6.8 5.2  HGB 11.0*   < > 10.0* 10.6* 11.4* 9.9* 10.6*  HCT 33.7*   < >  31.3* 33.3* 35.1* 30.3* 33.3*  MCV 83.6   < > 84.1 84.1 83.2 82.3 83.5  PLT 297   < > 266 264 302 307 318   < > = values in this interval not displayed.      Lab Results  Component Value Date   HEPBSAG Negative 01/25/2019      Microbiology:  Recent Results (from the past 240 hour(s))  Culture, blood (routine x 2)     Status: None   Collection Time: 04/12/20  9:33 PM   Specimen: BLOOD  Result Value Ref Range Status   Specimen Description BLOOD RIGHT ASSIST CONTROL  Final   Special Requests   Final    BOTTLES DRAWN AEROBIC AND ANAEROBIC Blood Culture results may not be optimal due to an inadequate volume of blood received in culture bottles   Culture   Final    NO GROWTH 5 DAYS Performed at [facility], [address] [place], [place] [address]    Report Status 04/17/2020 FINAL  Final  Respiratory Panel by RT PCR (Flu A&B, Covid) - Nasopharyngeal Swab     Status: None   Collection Time: 04/12/20  9:38 PM   Specimen: Nasopharyngeal Swab  Result Value Ref Range Status   SARS Coronavirus 2 by RT PCR NEGATIVE NEGATIVE Final    Comment: (NOTE) SARS-CoV-2 target nucleic acids are NOT DETECTED.  The SARS-CoV-2 RNA  is generally detectable in upper respiratoy specimens during the acute phase of infection. The lowest concentration of SARS-CoV-2 viral copies this assay can detect is 131 copies/mL. A negative result does not preclude SARS-Cov-2 infection and should not be used as the sole basis for treatment or other patient management decisions. A negative result may occur with  improper specimen collection/handling, submission of specimen other than nasopharyngeal swab, presence of viral mutation(s) within the areas targeted by this assay, and inadequate number of viral copies (<131 copies/mL). A negative result must be combined with clinical observations, patient history, and epidemiological information. The expected result is Negative.  Fact Sheet for Patients:  PinkCheek.be  Fact Sheet for Healthcare Providers:  GravelBags.it  This test is no t yet approved or cleared by the [place] FDA and  has been authorized for detection and/or diagnosis of SARS-CoV-2 by FDA under an Emergency Use Authorization (EUA). This EUA will remain  in effect (meaning this test can be used) for the duration of the COVID-19 declaration under Section 564(b)(1) of the Act, 21 U.S.C. section 360bbb-3(b)(1), unless the authorization is terminated or revoked sooner.     Influenza A by PCR NEGATIVE NEGATIVE Final   Influenza B by PCR NEGATIVE NEGATIVE Final    Comment: (NOTE) The Xpert Xpress SARS-CoV-2/FLU/RSV assay is intended as an aid in  the diagnosis of influenza from Nasopharyngeal swab specimens and  should not be used as a sole basis for treatment. Nasal washings and  aspirates are unacceptable for Xpert Xpress SARS-CoV-2/FLU/RSV  testing.  Fact Sheet for Patients: PinkCheek.be  Fact Sheet for Healthcare Providers: GravelBags.it  This test is not yet approved or cleared by the [place] FDA and  has been authorized for detection and/or diagnosis of SARS-CoV-2 by  FDA under an Emergency Use Authorization (EUA). This EUA will remain  in effect (meaning this test can be used) for the duration of the  Covid-19 declaration under Section 564(b)(1) of the Act, 21  U.S.C. section 360bbb-3(b)(1), unless the authorization is  terminated or revoked. Performed at [facility], [place]  [address] [place], [place] [address]   Peritoneal Fluid culture ( includes gram stain)     Status: None   Collection Time: 04/12/20 10:28 PM   Specimen: Peritoneal Washings; Peritoneal Fluid  Result Value Ref Range Status   Specimen Description   Final    PERITONEAL Performed at [facility], [address] [place], [place] [address]    Special Requests   Final    NONE Performed at [facility], [address] [place], [place] [address]    Gram Stain   Final    ABUNDANT WBC PRESENT, PREDOMINANTLY PMN NO ORGANISMS SEEN    Culture   Final    NO GROWTH Performed at [facility], [place] [address] [place], [place] [address]    Report Status 04/16/2020 FINAL  Final  Culture, blood (Routine X 2) w Reflex to ID Panel     Status: None (Preliminary result)   Collection Time: 04/13/20  6:40 PM   Specimen: BLOOD  Result Value Ref Range Status   Specimen Description BLOOD RIGHT ANTECUBITAL  Final   Special Requests   Final    BOTTLES DRAWN AEROBIC AND ANAEROBIC Blood Culture adequate volume   Culture   Final    NO GROWTH 4 DAYS Performed at [facility], [address] [place], [place] [address]    Report Status PENDING  Incomplete  C Difficile Quick Screen w PCR reflex     Status: None   Collection Time: 04/14/20 10:05 PM   Specimen: STOOL  Result Value Ref Range Status   C Diff antigen NEGATIVE NEGATIVE Final   C Diff toxin NEGATIVE NEGATIVE Final   C Diff interpretation No C. difficile detected.  Final    Comment: Performed at [facility], [address] [place], [place] [address]    Coagulation Studies: No results for input(s): LABPROT, INR in the last 72 hours.  Urinalysis: No results for input(s): COLORURINE, LABSPEC, PHURINE, GLUCOSEU, HGBUR, BILIRUBINUR, KETONESUR, PROTEINUR, UROBILINOGEN, NITRITE, LEUKOCYTESUR in the last 72 hours.  Invalid input(s): APPERANCEUR    Imaging: No results found.   Medications:   . sodium chloride 250 mL (04/17/20 8299)  . piperacillin-tazobactam (ZOSYN)  IV 2.25 g (04/17/20 1224)   . albuterol  2 mg Oral BID  . calcium acetate  1,334 mg Oral TID WC  . calcium-vitamin D   Oral Daily  . Chlorhexidine Gluconate Cloth  6 each Topical Q0600  . cholecalciferol  2,000 Units Oral BID  . epoetin (EPOGEN/PROCRIT) injection  10,000 Units Subcutaneous Weekly  . fluticasone furoate-vilanterol  1 puff Inhalation Daily   And  . umeclidinium bromide  1 puff Inhalation Daily  . gentamicin cream  1 application Topical Daily  . heparin injection (subcutaneous)  5,000 Units Subcutaneous Q8H  . insulin aspart  0-5 Units Subcutaneous QHS  . insulin aspart  0-6 Units Subcutaneous TID WC  . insulin glargine  10 Units Subcutaneous QHS  . isosorbide mononitrate  30 mg Oral Daily  . levalbuterol  0.63 mg Nebulization BID  . multivitamin  1 tablet Oral QHS  . pantoprazole  40 mg Oral Daily  . vancomycin variable dose per unstable renal function (pharmacist dosing)   Does not apply See admin instructions   sodium chloride, acetaminophen **OR** acetaminophen, albuterol, calcium acetate **AND** calcium acetate, heparin, hydrALAZINE, ondansetron **OR** ondansetron (ZOFRAN) IV  Assessment/ Plan:  79 y.o. male with ESRD on PD, COPD, Diverticular disease, HTN, Covid 19 infection in Jan 2021,  HLD, hypothyroidism, nasal mass removal, h/o Sub dural hematoma, OSA was admitted on 04/12/2020 for  Principal Problem:   Sepsis [facility]) Active Problems:   Coronary atherosclerosis   Esophageal  reflux   End stage renal disease (HCC)   COPD (chronic obstructive pulmonary disease) (HCC)   Spontaneous bacterial peritonitis ([place])   Dialysis-associated peritonitis ([place])   Goals of care, counseling/discussion   Palliative care by specialist   DNR (do not resuscitate)  Peritonitis associated with peritoneal dialysis, initial encounter ([place]) [T85.71XA] Sepsis ([place]) [A41.9] Altered mental status, unspecified altered mental status type [R41.82] Sepsis without acute organ dysfunction, due to unspecified organism ([place]) [A41.9]  #. ESRD Patient had issues with peritoneal dialysis access.  Issues noted with draining.  We will likely need to readdress this as an outpatient as currently more senior PD nurses unavailable.  -Hopefully we can readdress patient's PD catheter tomorrow.  We will plan for hemodialysis again tomorrow.  # Acute Peritonitis Continue vancomycin and Zosyn for now.  #. Anemia of CKD  Lab Results  Component Value Date   HGB 10.6 (L) 04/17/2020   Will continue Epogen weekly  #. Secondary hyperparathyroidism of renal origin N 25.81      Component Value Date/Time   PTH 218 (H) 01/26/2019 1228   Lab Results  Component Value Date   PHOS 4.7 (H) 04/17/2020  Continue to periodically monitor abdominal metabolism parameters..  #. Diabetes type 2 with CKD Hgb A1c MFr Bld (%)  Date Value  04/13/2020 7.5 (H)  Blood glucose readings within acceptable range  #Hyponatremia Continue to periodically monitor serum sodium.  #Hypokalemia  Potassium up to 3.5 and acceptable.   LOS: 5 Thomas [name] 11/14/20211:17 PM  [place], [place]

## 2020-04-17 NOTE — Plan of Care (Signed)
Continuing with plan of care. 

## 2020-04-17 NOTE — Progress Notes (Signed)
Patient was withdrawn during first half of night shift. He stated that he did "not feel like it was worth living life" due to his illness. Patient also stated that he missed his wife. NP Ouma was informed and thought a psych evaluation was appropriate but afterwards decided that a consult with spiritual services to talk to the chaplain would benefit patient. Patient was in a better mood and very talkative during last hours of night shift. Stated that he was ready to go home.

## 2020-04-17 NOTE — Progress Notes (Signed)
PROGRESS NOTE  Thomas [name] KNL:[id number] DOB: 1940/11/12 DOA: 04/12/2020 PCP: [facility], [facility]  HPI/Recap of past 24 hours: HPI from Dr [name] is a 79 y.o. male with medical history significant of end-stage renal disease on peritoneal dialysis, COPD, diverticular disease, hypertension, COVID-19 infection in January of this year, hyperlipidemia, hypothyroidism, nasal mass status post nose removal, history of subdural hematoma and obstructive sleep apnea oxygen dependent with facemask who was brought in with abdominal pain fever and chills.  Patient apparently had some issues with his peritoneal dialysis fluid and nonsterile procedure and exchange of fluid was performed few days ago. Started having significant abdominal pain altered mental status and fever.  Patient admitted for further management.  In the ER, patient was noted to have diffuse abdominal tenderness with rebound consistent with peritonitis.  Aspiration of his peritoneal fluid showed cloudy fluid which has been sent to the lab, peritoneal fluid culture and blood cultures have been taken, started on antibiotics with suspected spontaneous bacterial peritonitis.  Noted to have temperature 100.9 blood pressure 166/81 pulse 110 respiratory rate of 22 oxygen sat 90%.  Sodium is 134 potassium 4.3 chloride of 89 CO2 26 glucose 154 BUN 59 creatinine 9.72 and a gap of 19.  Troponin is 24 lactic acid 2.4 white count 14.2 hemoglobin 11.1 platelets of 297.  COVID-19 screen is negative.     Today, patient denies any worsening abdominal pain, fever/chills, nausea/vomiting, chest pain, shortness of breath.  Eager to be discharged home.    Assessment/Plan: Principal Problem:   Sepsis ([place]) Active Problems:   Coronary atherosclerosis   Esophageal reflux   End stage renal disease (HCC)   COPD (chronic obstructive pulmonary disease) (HCC)   Spontaneous bacterial peritonitis ([place])   Dialysis-associated peritonitis (HCC)    Goals of care, counseling/discussion   Palliative care by specialist   DNR (do not resuscitate)   Severe sepsis likely 2/2 peritonitis associated with PD On admission, temp 100.9, heart rate 110, respiratory rate 22, WBC 14.2, lactic acid 2.4 with altered mental status Likely complication from peritoneal dialysis Currently afebrile, with resolved leukocytosis Lactic acid, procalcitonin elevated (worsened likely due to ESRD, not getting dialysis) BC x2 NGTD Peritoneal fluid analysis culture NGTD Continue IV Zosyn, vancomycin Monitor closely  Acute on chronic hypoxic respiratory failure Possible CAP History of COPD Patient noted to be tachypneic, requiring about 6 L of O2 via facemask, 5 L at home Chest x-ray showing multifocal left lung infiltrate, with small associated parapneumonic effusion Continue IV Zosyn, vancomycin for now, duo nebs, inhaler Supplemental O2  ESRD On PD, PD catheter malfunction, nephrology plan to switch HD, first session done on 04/15/20, next on 04/18/20 Nephrology consulted  Hypokalemia Correction per HD  Anemia of chronic kidney disease/iron deficiency Baseline hemoglobin around 12 Anemia panel showed iron 17, sats 12, ferritin 796 On weekly Epogen Daily CBC, monitor for any signs of bleeding  Hypertension Stable Continue home Imdur, hydralazine as needed  Diabetes mellitus type 2 A1c on 04/13/2020 was 7.5 SSI, Accu-Cheks, Lantus, hypoglycemic protocol  GERD Continue PPI  OSA Continue oxygen via facemask  Obesity Lifestyle modification advised  Goals of care discussion Patient with multiple comorbidities, poor prognosis Palliative consult for further goals of care discussion, now made DNR      Malnutrition Type:      Malnutrition Characteristics:      Nutrition Interventions:       Estimated body mass index is 30.39 kg/m as calculated from the following:  Height as of this encounter: 5' 9.02" (1.753 m).   Weight  as of this encounter: 93.4 kg.     Code Status: Full  Family Communication: Discussed with daughter on 04/15/20  Disposition Plan: Status is: Inpatient  Remains inpatient appropriate because:Inpatient level of care appropriate due to severity of illness   Dispo: The patient is from: Home              Anticipated d/c is to: SNF              Anticipated d/c date is: 1 day              Patient currently is not medically stable to d/c.    Consultants:  Nephrology  Procedures:  None  Antimicrobials:  IV Zosyn   Vancomycin  DVT prophylaxis: Heparin [place]   Objective: Vitals:   04/17/20 0411 04/17/20 0500 04/17/20 0724 04/17/20 1250  BP: (!) 161/65   (!) 171/61  Pulse: 84   92  Resp: 18   20  Temp: 97.6 F (36.4 C)   97.8 F (36.6 C)  TempSrc: Axillary   Oral  SpO2: 98%  98% 93%  Weight:  93.4 kg    Height:        Intake/Output Summary (Last 24 hours) at 04/17/2020 1536 Last data filed at 04/17/2020 1300 Gross per 24 hour  Intake 0 ml  Output 90 ml  Net -90 ml   Filed Weights   04/15/20 0509 04/16/20 0500 04/17/20 0500  Weight: 96.6 kg 92.3 kg 93.4 kg    Exam:  General:  Absent nose s/p surgery, chronically ill-appearing  Cardiovascular: S1, S2 present  Respiratory:  Diminished breath sounds bilaterally  Abdomen: Soft, non-tender, distended, bowel sounds present  Musculoskeletal: No bilateral pedal edema noted  Skin: Normal  Psychiatry:  Normal mood   Data Reviewed: CBC: Recent Labs  Lab 04/12/20 2133 04/12/20 2133 04/13/20 0300 04/14/20 0416 04/15/20 0448 04/16/20 0408 04/17/20 0629  WBC 14.2*   < > 17.5* 15.8* 12.1* 8.7 7.7  NEUTROABS 13.0*  --   --  13.3* 9.9* 6.8 5.2  HGB 11.0*   < > 10.0* 10.6* 11.4* 9.9* 10.6*  HCT 33.7*   < > 31.3* 33.3* 35.1* 30.3* 33.3*  MCV 83.6   < > 84.1 84.1 83.2 82.3 83.5  PLT 297   < > 266 264 302 307 318   < > = values in this interval not displayed.   Basic Metabolic Panel: Recent Labs  Lab  04/12/20 2133 04/13/20 0119 04/13/20 0300 04/14/20 0416 04/15/20 0448 04/16/20 0408 04/17/20 0629  NA   < >  --  132* 132* 131* 137 138  K   < >  --  4.3 3.7 3.1* 3.4* 3.5  CL   < >  --  92* 88* 85* 94* 96*  CO2   < >  --  22 28 27 26 24   GLUCOSE   < >  --  165* 211* 145* 148* 123*  BUN   < >  --  60* 62* 73* 46* 55*  CREATININE   < >  --  9.57* 9.42* 10.62* 8.22* 9.56*  CALCIUM   < >  --  8.3* 8.3* 8.1* 8.5* 8.6*  MG  --  1.7  --   --   --   --   --   PHOS  --   --   --  4.9* 6.0* 4.1 4.7*   < > = values  in this interval not displayed.   GFR: Estimated Creatinine Clearance: 7.1 mL/min (A) (by C-G formula based on SCr of 9.56 mg/dL (H)). Liver Function Tests: Recent Labs  Lab 04/12/20 2133 04/12/20 2133 04/13/20 0300 04/14/20 0416 04/15/20 0448 04/16/20 0408 04/17/20 0629  AST 19  --  17  --   --   --   --   ALT 15  --  13  --   --   --   --   ALKPHOS 137*  --  105  --   --   --   --   BILITOT 0.7  --  0.9  --   --   --   --   PROT 7.2  --  6.2*  --   --   --   --   ALBUMIN 2.9*   < > 2.5* 2.5* 2.2* 2.4* 2.3*   < > = values in this interval not displayed.   No results for input(s): LIPASE, AMYLASE in the last 168 hours. No results for input(s): AMMONIA in the last 168 hours. Coagulation Profile: Recent Labs  Lab 04/13/20 0300  INR 1.1   Cardiac Enzymes: No results for input(s): CKTOTAL, CKMB, CKMBINDEX, TROPONINI in the last 168 hours. BNP (last 3 results) No results for input(s): PROBNP in the last 8760 hours. HbA1C: No results for input(s): HGBA1C in the last 72 hours. CBG: Recent Labs  Lab 04/16/20 1138 04/16/20 1600 04/16/20 2204 04/17/20 0741 04/17/20 1203  GLUCAP 134* 130* 127* 121* 121*   Lipid Profile: No results for input(s): CHOL, HDL, LDLCALC, TRIG, CHOLHDL, LDLDIRECT in the last 72 hours. Thyroid Function Tests: No results for input(s): TSH, T4TOTAL, FREET4, T3FREE, THYROIDAB in the last 72 hours. Anemia Panel: No results for input(s):  VITAMINB12, FOLATE, FERRITIN, TIBC, IRON, RETICCTPCT in the last 72 hours. Urine analysis:    Component Value Date/Time   COLORURINE YELLOW 06/15/2008 1856   APPEARANCEUR CLEAR 06/15/2008 1856   LABSPEC >1.030 (H) 06/15/2008 1856   PHURINE 5.0 06/15/2008 1856   GLUCOSEU 250 06/29/2012 1308   HGBUR TRACE (A) 06/15/2008 1856   BILIRUBINUR SMALL (A) 06/15/2008 1856   KETONESUR TRACE (A) 06/15/2008 1856   PROTEINUR >300 (A) 06/15/2008 1856   UROBILINOGEN 0.2 06/15/2008 1856   NITRITE NEGATIVE 06/15/2008 1856   LEUKOCYTESUR NEGATIVE 06/15/2008 1856   Sepsis Labs: @LABRCNTIP (procalcitonin:4,lacticidven:4)  ) Recent Results (from the past 240 hour(s))  Culture, blood (routine x 2)     Status: None   Collection Time: 04/12/20  9:33 PM   Specimen: BLOOD  Result Value Ref Range Status   Specimen Description BLOOD RIGHT ASSIST CONTROL  Final   Special Requests   Final    BOTTLES DRAWN AEROBIC AND ANAEROBIC Blood Culture results may not be optimal due to an inadequate volume of blood received in culture bottles   Culture   Final    NO GROWTH 5 DAYS Performed at [facility], [address] [place], [place] [address]    Report Status 04/17/2020 FINAL  Final  Respiratory Panel by RT PCR (Flu A&B, Covid) - Nasopharyngeal Swab     Status: None   Collection Time: 04/12/20  9:38 PM   Specimen: Nasopharyngeal Swab  Result Value Ref Range Status   SARS Coronavirus 2 by RT PCR NEGATIVE NEGATIVE Final    Comment: (NOTE) SARS-CoV-2 target nucleic acids are NOT DETECTED.  The SARS-CoV-2 RNA is generally detectable in upper respiratoy specimens during the acute phase of infection. The lowest  concentration of SARS-CoV-2 viral copies this assay can detect is 131 copies/mL. A negative result does not preclude SARS-Cov-2 infection and should not be used as the sole basis for treatment or other patient management decisions. A negative result may occur with  improper specimen  collection/handling, submission of specimen other than nasopharyngeal swab, presence of viral mutation(s) within the areas targeted by this assay, and inadequate number of viral copies (<131 copies/mL). A negative result must be combined with clinical observations, patient history, and epidemiological information. The expected result is Negative.  Fact Sheet for Patients:  PinkCheek.be  Fact Sheet for Healthcare Providers:  GravelBags.it  This test is no t yet approved or cleared by the [place] FDA and  has been authorized for detection and/or diagnosis of SARS-CoV-2 by FDA under an Emergency Use Authorization (EUA). This EUA will remain  in effect (meaning this test can be used) for the duration of the COVID-19 declaration under Section 564(b)(1) of the Act, 21 U.S.C. section 360bbb-3(b)(1), unless the authorization is terminated or revoked sooner.     Influenza A by PCR NEGATIVE NEGATIVE Final   Influenza B by PCR NEGATIVE NEGATIVE Final    Comment: (NOTE) The Xpert Xpress SARS-CoV-2/FLU/RSV assay is intended as an aid in  the diagnosis of influenza from Nasopharyngeal swab specimens and  should not be used as a sole basis for treatment. Nasal washings and  aspirates are unacceptable for Xpert Xpress SARS-CoV-2/FLU/RSV  testing.  Fact Sheet for Patients: PinkCheek.be  Fact Sheet for Healthcare Providers: GravelBags.it  This test is not yet approved or cleared by the [place] FDA and  has been authorized for detection and/or diagnosis of SARS-CoV-2 by  FDA under an Emergency Use Authorization (EUA). This EUA will remain  in effect (meaning this test can be used) for the duration of the  Covid-19 declaration under Section 564(b)(1) of the Act, 21  U.S.C. section 360bbb-3(b)(1), unless the authorization is  terminated or revoked. Performed at [facility], [address] [place], [place] [address]   Peritoneal Fluid culture ( includes gram stain)     Status: None   Collection Time: 04/12/20 10:28 PM   Specimen: Peritoneal Washings; Peritoneal Fluid  Result Value Ref Range Status   Specimen Description   Final    PERITONEAL Performed at [facility], [address] [place], [place] [address]    Special Requests   Final    NONE Performed at [facility], [address] [place], [place] [address]    Gram Stain   Final    ABUNDANT WBC PRESENT, PREDOMINANTLY PMN NO ORGANISMS SEEN    Culture   Final    NO GROWTH Performed at [facility], [place] [address], [place], [place] [address]    Report Status 04/16/2020 FINAL  Final  Culture, blood (Routine X 2) w Reflex to ID Panel     Status: None (Preliminary result)   Collection Time: 04/13/20  6:40 PM   Specimen: BLOOD  Result Value Ref Range Status   Specimen Description BLOOD RIGHT ANTECUBITAL  Final   Special Requests   Final    BOTTLES DRAWN AEROBIC AND ANAEROBIC Blood Culture adequate volume   Culture   Final    NO GROWTH 4 DAYS Performed at [facility], [address], [place], [place] [address]    Report Status PENDING  Incomplete  C Difficile Quick Screen w PCR reflex     Status: None   Collection Time:  04/14/20 10:05 PM   Specimen: STOOL  Result Value Ref Range Status   C Diff antigen NEGATIVE NEGATIVE Final   C Diff toxin NEGATIVE NEGATIVE Final   C Diff interpretation No C. difficile detected.  Final    Comment: Performed at [facility], [address] [place], [place] [address]      Studies: No results found.  Scheduled Meds: . albuterol  2 mg Oral BID  . calcium acetate  1,334 mg Oral TID WC  . calcium-vitamin D   Oral Daily  . Chlorhexidine Gluconate Cloth  6 each Topical Q0600  . cholecalciferol  2,000 Units Oral BID  . epoetin (EPOGEN/PROCRIT) injection  10,000 Units Subcutaneous Weekly  .  fluticasone furoate-vilanterol  1 puff Inhalation Daily   And  . umeclidinium bromide  1 puff Inhalation Daily  . gentamicin cream  1 application Topical Daily  . heparin injection (subcutaneous)  5,000 Units Subcutaneous Q8H  . insulin aspart  0-5 Units Subcutaneous QHS  . insulin aspart  0-6 Units Subcutaneous TID WC  . insulin glargine  10 Units Subcutaneous QHS  . isosorbide mononitrate  30 mg Oral Daily  . levalbuterol  0.63 mg Nebulization BID  . multivitamin  1 tablet Oral QHS  . pantoprazole  40 mg Oral Daily  . vancomycin variable dose per unstable renal function (pharmacist dosing)   Does not apply See admin instructions    Continuous Infusions: . sodium chloride 250 mL (04/17/20 8315)  . piperacillin-tazobactam (ZOSYN)  IV 2.25 g (04/17/20 1224)     LOS: 5 days     Alma Friendly, MD [facility]  If 7PM-7AM, please contact night-coverage [email or web address] 04/17/2020, 3:36 PM

## 2020-04-17 NOTE — Progress Notes (Signed)
Physical Therapy Treatment Patient Details Name: Thomas [name] MRN: [id number] DOB: 1940-11-11 Today's Date: 04/17/2020    History of Present Illness Pt is a 79yo M admitted to [facility] on 04/12/20 due to AMS, fever, generalized weakness, and diffused low back and abdominal pain (consistent with peritonitis) after being found by family to have fallen out of bed. Upon admission pt was tachycardic and febrile, concerning for sepsis. Significant PMH includes: HTN, diabetes, CAD, ESRD on PD, COPD on 5L via face tent, hypothyroidism, and nasal cancer s/p nose amputation. Per family report, there was an issue with his PD on 04/11/20 where the connection from dialysate to catheter became undone, resulting in significant leakage. Chest x-ray concerning for multifocal PNA, CT head/c-spine negative for acute process, and CT abdomen/pelvis show intraperitoneal free air likely related to recent issue with PD catheter.    PT Comments    Patient agrees to PT treatment.He has 3/5 BLE hip flex and knee extension strength. He has good sitting balance and fair  standing balance with UE support with RW.  He is min A with bed mobility, min assist for transfers sit to stand with RW. He ambulates with RW 6 feet with min assist. He reports that he does not want to go to a SNF and he has support and assist to go home and would like to go home.  Patient will continue to benefit from skilled PT to improve mobility and strength.   Follow Up Recommendations  SNF;Supervision/Assistance - 24 hour     Equipment Recommendations  Rolling walker with 5" wheels    Recommendations for Other Services       Precautions / Restrictions Restrictions Weight Bearing Restrictions: No    Mobility  Bed Mobility Overal bed mobility: Needs Assistance Bed Mobility: Supine to Sit;Sit to Supine     Supine to sit: Min assist Sit to supine: Supervision      Transfers Overall transfer level: Modified independent Equipment used:  Rolling walker (2 wheeled)             General transfer comment: static standing good with RW  Ambulation/Gait Ambulation/Gait assistance: Min guard Gait Distance (Feet): 6 Feet Assistive device: Rolling walker (2 wheeled) Gait Pattern/deviations: Step-to pattern Gait velocity:  (decreased)       Stairs             Wheelchair Mobility    Modified Rankin (Stroke Patients Only)       Balance Overall balance assessment: Modified Independent Sitting-balance support: No upper extremity supported;Feet supported Sitting balance-Leahy Scale: Good         Standing balance comment:  (fair with RW)                            Cognition Arousal/Alertness: Awake/alert Behavior During Therapy: WFL for tasks assessed/performed Overall Cognitive Status: Within Functional Limits for tasks assessed                   Orientation Level: Person;Place;Time;Situation     Following Commands: Follows multi-step commands with increased time              Exercises      General Comments        Pertinent Vitals/Pain Pain Assessment: No/denies pain    Home Living                      Prior Function  PT Goals (current goals can now be found in the care plan section) Acute Rehab PT Goals Patient Stated Goal: to go home PT Goal Formulation: With patient Potential to Achieve Goals: Good Progress towards PT goals: Progressing toward goals    Frequency    Min 2X/week      PT Plan Current plan remains appropriate    Co-evaluation              AM-PAC PT "6 Clicks" Mobility   Outcome Measure  Help needed turning from your back to your side while in a flat bed without using bedrails?: A Little Help needed moving from lying on your back to sitting on the side of a flat bed without using bedrails?: A Little Help needed moving to and from a bed to a chair (including a wheelchair)?: A Little Help needed standing up  from a chair using your arms (e.g., wheelchair or bedside chair)?: A Little Help needed to walk in hospital room?: A Little Help needed climbing 3-5 steps with a railing? : A Lot 6 Click Score: 17    End of Session Equipment Utilized During Treatment: Gait belt Activity Tolerance: Patient tolerated treatment well Patient left: in bed;with bed alarm set Nurse Communication: Mobility status PT Visit Diagnosis: Muscle weakness (generalized) (M62.81);Difficulty in walking, not elsewhere classified (R26.2)     Time: 2542-7062 PT Time Calculation (min) (ACUTE ONLY): 30 min  Charges:  $Gait Training: 8-22 mins $Therapeutic Activity: 8-22 mins                        Arelia Sneddon S, PT DPT 04/17/2020, 11:26 AM

## 2020-04-18 LAB — RENAL FUNCTION PANEL
Albumin: 2.2 g/dL — ABNORMAL LOW (ref 3.5–5.0)
Anion gap: 17 — ABNORMAL HIGH (ref 5–15)
BUN: 60 mg/dL — ABNORMAL HIGH (ref 8–23)
CO2: 23 mmol/L (ref 22–32)
Calcium: 8.8 mg/dL — ABNORMAL LOW (ref 8.9–10.3)
Chloride: 99 mmol/L (ref 98–111)
Creatinine, Ser: 10.6 mg/dL — ABNORMAL HIGH (ref 0.61–1.24)
GFR, Estimated: 5 mL/min — ABNORMAL LOW (ref >60)
Glucose, Bld: 164 mg/dL — ABNORMAL HIGH (ref 70–99)
Phosphorus: 5.1 mg/dL — ABNORMAL HIGH (ref 2.5–4.6)
Potassium: 3.6 mmol/L (ref 3.5–5.1)
Sodium: 139 mmol/L (ref 135–145)

## 2020-04-18 LAB — CBC WITH DIFFERENTIAL/PLATELET
Abs Immature Granulocytes: 0.38 10*3/uL — ABNORMAL HIGH (ref 0.00–0.07)
Basophils Absolute: 0.1 10*3/uL (ref 0.0–0.1)
Basophils Relative: 1 %
Eosinophils Absolute: 0.6 10*3/uL — ABNORMAL HIGH (ref 0.0–0.5)
Eosinophils Relative: 7 %
HCT: 30.2 % — ABNORMAL LOW (ref 39.0–52.0)
Hemoglobin: 9.9 g/dL — ABNORMAL LOW (ref 13.0–17.0)
Immature Granulocytes: 5 %
Lymphocytes Relative: 16 %
Lymphs Abs: 1.3 10*3/uL (ref 0.7–4.0)
MCH: 26.9 pg (ref 26.0–34.0)
MCHC: 32.8 g/dL (ref 30.0–36.0)
MCV: 82.1 fL (ref 80.0–100.0)
Monocytes Absolute: 0.6 10*3/uL (ref 0.1–1.0)
Monocytes Relative: 8 %
Neutro Abs: 5.3 10*3/uL (ref 1.7–7.7)
Neutrophils Relative %: 63 %
Platelets: 315 10*3/uL (ref 150–400)
RBC: 3.68 MIL/uL — ABNORMAL LOW (ref 4.22–5.81)
RDW: 17.6 % — ABNORMAL HIGH (ref 11.5–15.5)
WBC: 8.2 10*3/uL (ref 4.0–10.5)
nRBC: 0 % (ref 0.0–0.2)

## 2020-04-18 LAB — CULTURE, BLOOD (ROUTINE X 2)
Culture: NO GROWTH
Special Requests: ADEQUATE

## 2020-04-18 LAB — GLUCOSE, CAPILLARY
Glucose-Capillary: 152 mg/dL — ABNORMAL HIGH (ref 70–99)
Glucose-Capillary: 121 mg/dL — ABNORMAL HIGH (ref 70–99)
Glucose-Capillary: 169 mg/dL — ABNORMAL HIGH (ref 70–99)

## 2020-04-18 LAB — PHOSPHORUS: Phosphorus: 5 mg/dL — ABNORMAL HIGH (ref 2.5–4.6)

## 2020-04-18 MED ORDER — ALTEPLASE 2 MG IJ SOLR
4.0000 mg | Freq: Once | INTRAMUSCULAR | Status: AC
  Administered 2020-04-18: 6 mg
  Filled 2020-04-18: qty 4

## 2020-04-18 NOTE — Progress Notes (Addendum)
SLP Cancellation Note  Patient Details Name: Thomas [name] MRN: [id number] DOB: 1941-03-24   Cancelled treatment:  Reason Eval/Treat Not Completed: Other (comment) (Pt in dialysis)   Pt was in dialysis at time of evaluation. ST services to f/u at a later time when pt is available.     Quintella Baton  Graduate Clinician  04/18/2020, 3:58 PM    The information in this patient note, response to treatment, and overall treatment plan developed has been reviewed and agreed upon after reviewing documentation. This session was performed under the supervision of this licensed clinician.     Orinda Kenner, MS, [place] Speech Language Pathologist Rehab Services [phone]

## 2020-04-18 NOTE — Progress Notes (Signed)
PROGRESS NOTE  Thomas [name] IZT:[id number] DOB: [birth year] DOA: 04/12/2020 PCP: [facility], [facility]  HPI/Recap of past 24 hours: HPI from Dr [name] is a 79 y.o. male with medical history significant of end-stage renal disease on peritoneal dialysis, COPD, diverticular disease, hypertension, COVID-19 infection in January of this year, hyperlipidemia, hypothyroidism, nasal mass status post nose removal, history of subdural hematoma and obstructive sleep apnea oxygen dependent with facemask who was brought in with abdominal pain fever and chills.  Patient apparently had some issues with his peritoneal dialysis fluid and nonsterile procedure and exchange of fluid was performed few days ago. Started having significant abdominal pain altered mental status and fever.  Patient admitted for further management.  In the ER, patient was noted to have diffuse abdominal tenderness with rebound consistent with peritonitis.  Aspiration of his peritoneal fluid showed cloudy fluid which has been sent to the lab, peritoneal fluid culture and blood cultures have been taken, started on antibiotics with suspected spontaneous bacterial peritonitis.  Noted to have temperature 100.9 blood pressure 166/81 pulse 110 respiratory rate of 22 oxygen sat 90%.  Sodium is 134 potassium 4.3 chloride of 89 CO2 26 glucose 154 BUN 59 creatinine 9.72 and a gap of 19.  Troponin is 24 lactic acid 2.4 white count 14.2 hemoglobin 11.1 platelets of 297.  COVID-19 screen is negative.     Patient denies any new complaints, denies any abdominal pain, nausea/vomiting, fever/chills, shortness of breath, chest pain.  Planning to declot his PD catheter as patient is eager to be discharged home and wants to continue PD instead of HD    Assessment/Plan: Principal Problem:   Sepsis ([place]) Active Problems:   Coronary atherosclerosis   Esophageal reflux   End stage renal disease (HCC)   COPD (chronic obstructive pulmonary  disease) (HCC)   Spontaneous bacterial peritonitis ([place])   Dialysis-associated peritonitis ([place])   Goals of care, counseling/discussion   Palliative care by specialist   DNR (do not resuscitate)   Severe sepsis likely 2/2 peritonitis associated with PD On admission, temp 100.9, heart rate 110, respiratory rate 22, WBC 14.2, lactic acid 2.4 with altered mental status Likely complication from peritoneal dialysis Currently afebrile, with resolved leukocytosis Lactic acid, procalcitonin elevated (worsened likely due to ESRD, not getting dialysis) BC x2 NGTD Peritoneal fluid analysis culture NGTD Continue IV Zosyn, vancomycin Monitor closely  Acute on chronic hypoxic respiratory failure Possible CAP History of COPD Patient noted to be tachypneic, requiring about 6 L of O2 via facemask, 5 L at home Chest x-ray showing multifocal left lung infiltrate, with small associated parapneumonic effusion Continue IV Zosyn, vancomycin for now, duo nebs, inhaler Supplemental O2  ESRD On PD, PD catheter malfunction, nephrology plan to switch HD, first session done on 04/15/20, another HD done on 04/18/20 Nephrology consulted  Hypokalemia Correction per HD  Anemia of chronic kidney disease/iron deficiency Baseline hemoglobin around 12 Anemia panel showed iron 17, sats 12, ferritin 796 On weekly Epogen Daily CBC, monitor for any signs of bleeding  Hypertension Stable Continue home Imdur, hydralazine as needed  Diabetes mellitus type 2 A1c on 04/13/2020 was 7.5 SSI, Accu-Cheks, Lantus, hypoglycemic protocol  GERD Continue PPI  OSA Continue oxygen via facemask  Obesity Lifestyle modification advised  Goals of care discussion Patient with multiple comorbidities, poor prognosis Palliative consult for further goals of care discussion, now made DNR      Malnutrition Type:      Malnutrition Characteristics:  Nutrition Interventions:       Estimated body mass  index is 30.49 kg/m as calculated from the following:   Height as of this encounter: 5' 9.02" (1.753 m).   Weight as of this encounter: 93.7 kg.     Code Status: Full  Family Communication: Discussed with daughter on 04/18/20  Disposition Plan: Status is: Inpatient  Remains inpatient appropriate because:Inpatient level of care appropriate due to severity of illness   Dispo: The patient is from: Home              Anticipated d/c is to: SNF, wants to go home              Anticipated d/c date is: 1 day              Patient currently is not medically stable to d/c.    Consultants:  Nephrology  Procedures:  None  Antimicrobials:  IV Zosyn   Vancomycin  DVT prophylaxis: Heparin [place]   Objective: Vitals:   04/18/20 1430 04/18/20 1445 04/18/20 1500 04/18/20 1607  BP: (!) 118/49 122/65 (!) 139/59 (!) 141/81  Pulse: 87 87 87 82  Resp: 14 18 18 17   Temp:    (!) 97.5 F (36.4 C)  TempSrc:    Oral  SpO2:    97%  Weight:      Height:        Intake/Output Summary (Last 24 hours) at 04/18/2020 1836 Last data filed at 04/18/2020 1500 Gross per 24 hour  Intake 697.73 ml  Output 2000 ml  Net -1302.27 ml   Filed Weights   04/16/20 0500 04/17/20 0500 04/18/20 0455  Weight: 92.3 kg 93.4 kg 93.7 kg    Exam:  General:  Absent nose s/p surgery, chronically ill-appearing  Cardiovascular: S1, S2 present  Respiratory:  CTA B  Abdomen: Soft, non-tender, distended, bowel sounds present  Musculoskeletal: No bilateral pedal edema noted  Skin: Normal  Psychiatry:  Normal mood   Data Reviewed: CBC: Recent Labs  Lab 04/14/20 0416 04/15/20 0448 04/16/20 0408 04/17/20 0629 04/18/20 0451  WBC 15.8* 12.1* 8.7 7.7 8.2  NEUTROABS 13.3* 9.9* 6.8 5.2 5.3  HGB 10.6* 11.4* 9.9* 10.6* 9.9*  HCT 33.3* 35.1* 30.3* 33.3* 30.2*  MCV 84.1 83.2 82.3 83.5 82.1  PLT 264 302 307 318 481   Basic Metabolic Panel: Recent Labs  Lab 04/13/20 0119 04/13/20 0300 04/14/20 0416  04/15/20 0448 04/16/20 0408 04/17/20 0629 04/18/20 0451  NA  --    < > 132* 131* 137 138 139  K  --    < > 3.7 3.1* 3.4* 3.5 3.6  CL  --    < > 88* 85* 94* 96* 99  CO2  --    < > 28 27 26 24 23   GLUCOSE  --    < > 211* 145* 148* 123* 164*  BUN  --    < > 62* 73* 46* 55* 60*  CREATININE  --    < > 9.42* 10.62* 8.22* 9.56* 10.60*  CALCIUM  --    < > 8.3* 8.1* 8.5* 8.6* 8.8*  MG 1.7  --   --   --   --   --   --   PHOS  --   --  4.9* 6.0* 4.1 4.7* 5.0*   5.1*   < > = values in this interval not displayed.   GFR: Estimated Creatinine Clearance: 6.4 mL/min (A) (by C-G formula based on SCr of 10.6 mg/dL (  H)). Liver Function Tests: Recent Labs  Lab 04/12/20 2133 04/12/20 2133 04/13/20 0300 04/13/20 0300 04/14/20 0416 04/15/20 0448 04/16/20 0408 04/17/20 0629 04/18/20 0451  AST 19  --  17  --   --   --   --   --   --   ALT 15  --  13  --   --   --   --   --   --   ALKPHOS 137*  --  105  --   --   --   --   --   --   BILITOT 0.7  --  0.9  --   --   --   --   --   --   PROT 7.2  --  6.2*  --   --   --   --   --   --   ALBUMIN 2.9*   < > 2.5*   < > 2.5* 2.2* 2.4* 2.3* 2.2*   < > = values in this interval not displayed.   No results for input(s): LIPASE, AMYLASE in the last 168 hours. No results for input(s): AMMONIA in the last 168 hours. Coagulation Profile: Recent Labs  Lab 04/13/20 0300  INR 1.1   Cardiac Enzymes: No results for input(s): CKTOTAL, CKMB, CKMBINDEX, TROPONINI in the last 168 hours. BNP (last 3 results) No results for input(s): PROBNP in the last 8760 hours. HbA1C: No results for input(s): HGBA1C in the last 72 hours. CBG: Recent Labs  Lab 04/17/20 1203 04/17/20 1721 04/17/20 2025 04/18/20 0748 04/18/20 1608  GLUCAP 121* 150* 189* 152* 121*   Lipid Profile: No results for input(s): CHOL, HDL, LDLCALC, TRIG, CHOLHDL, LDLDIRECT in the last 72 hours. Thyroid Function Tests: No results for input(s): TSH, T4TOTAL, FREET4, T3FREE, THYROIDAB in the last  72 hours. Anemia Panel: No results for input(s): VITAMINB12, FOLATE, FERRITIN, TIBC, IRON, RETICCTPCT in the last 72 hours. Urine analysis:    Component Value Date/Time   COLORURINE YELLOW 06/15/2008 1856   APPEARANCEUR CLEAR 06/15/2008 1856   LABSPEC >1.030 (H) 06/15/2008 1856   PHURINE 5.0 06/15/2008 1856   GLUCOSEU 250 06/29/2012 1308   HGBUR TRACE (A) 06/15/2008 1856   BILIRUBINUR SMALL (A) 06/15/2008 1856   KETONESUR TRACE (A) 06/15/2008 1856   PROTEINUR >300 (A) 06/15/2008 1856   UROBILINOGEN 0.2 06/15/2008 1856   NITRITE NEGATIVE 06/15/2008 1856   LEUKOCYTESUR NEGATIVE 06/15/2008 1856   Sepsis Labs: @LABRCNTIP (procalcitonin:4,lacticidven:4)  ) Recent Results (from the past 240 hour(s))  Culture, blood (routine x 2)     Status: None   Collection Time: 04/12/20  9:33 PM   Specimen: BLOOD  Result Value Ref Range Status   Specimen Description BLOOD RIGHT ASSIST CONTROL  Final   Special Requests   Final    BOTTLES DRAWN AEROBIC AND ANAEROBIC Blood Culture results may not be optimal due to an inadequate volume of blood received in culture bottles   Culture   Final    NO GROWTH 5 DAYS Performed at [facility], [address], [place], [place] [address]    Report Status 04/17/2020 FINAL  Final  Respiratory Panel by RT PCR (Flu A&B, Covid) - Nasopharyngeal Swab     Status: None   Collection Time: 04/12/20  9:38 PM   Specimen: Nasopharyngeal Swab  Result Value Ref Range Status   SARS Coronavirus 2 by RT PCR NEGATIVE NEGATIVE Final    Comment: (NOTE) SARS-CoV-2 target nucleic acids are NOT DETECTED.  The SARS-CoV-2  RNA is generally detectable in upper respiratoy specimens during the acute phase of infection. The lowest concentration of SARS-CoV-2 viral copies this assay can detect is 131 copies/mL. A negative result does not preclude SARS-Cov-2 infection and should not be used as the sole basis for treatment or other patient management decisions. A negative  result may occur with  improper specimen collection/handling, submission of specimen other than nasopharyngeal swab, presence of viral mutation(s) within the areas targeted by this assay, and inadequate number of viral copies (<131 copies/mL). A negative result must be combined with clinical observations, patient history, and epidemiological information. The expected result is Negative.  Fact Sheet for Patients:  PinkCheek.be  Fact Sheet for Healthcare Providers:  GravelBags.it  This test is no t yet approved or cleared by the [place] FDA and  has been authorized for detection and/or diagnosis of SARS-CoV-2 by FDA under an Emergency Use Authorization (EUA). This EUA will remain  in effect (meaning this test can be used) for the duration of the COVID-19 declaration under Section 564(b)(1) of the Act, 21 U.S.C. section 360bbb-3(b)(1), unless the authorization is terminated or revoked sooner.     Influenza A by PCR NEGATIVE NEGATIVE Final   Influenza B by PCR NEGATIVE NEGATIVE Final    Comment: (NOTE) The Xpert Xpress SARS-CoV-2/FLU/RSV assay is intended as an aid in  the diagnosis of influenza from Nasopharyngeal swab specimens and  should not be used as a sole basis for treatment. Nasal washings and  aspirates are unacceptable for Xpert Xpress SARS-CoV-2/FLU/RSV  testing.  Fact Sheet for Patients: PinkCheek.be  Fact Sheet for Healthcare Providers: GravelBags.it  This test is not yet approved or cleared by the [place] FDA and  has been authorized for detection and/or diagnosis of SARS-CoV-2 by  FDA under an Emergency Use Authorization (EUA). This EUA will remain  in effect (meaning this test can be used) for the duration of the  Covid-19 declaration under Section 564(b)(1) of the Act, 21  U.S.C. section 360bbb-3(b)(1), unless the authorization is    terminated or revoked. Performed at [facility], [address] [place], [place] [address]   Peritoneal Fluid culture ( includes gram stain)     Status: None   Collection Time: 04/12/20 10:28 PM   Specimen: Peritoneal Washings; Peritoneal Fluid  Result Value Ref Range Status   Specimen Description   Final    PERITONEAL Performed at [facility], [address] [place], [place] [address]    Special Requests   Final    NONE Performed at [facility], [address] [place], [place] [address]    Gram Stain   Final    ABUNDANT WBC PRESENT, PREDOMINANTLY PMN NO ORGANISMS SEEN    Culture   Final    NO GROWTH Performed at [facility], [place] [address], [place], [place] [address]    Report Status 04/16/2020 FINAL  Final  Culture, blood (Routine X 2) w Reflex to ID Panel     Status: None   Collection Time: 04/13/20  6:40 PM   Specimen: BLOOD  Result Value Ref Range Status   Specimen Description BLOOD RIGHT ANTECUBITAL  Final   Special Requests   Final    BOTTLES DRAWN AEROBIC AND ANAEROBIC Blood Culture adequate volume   Culture   Final    NO GROWTH 5 DAYS Performed at [facility], [address] [place], [place] [address]    Report Status 04/18/2020 FINAL  Final  C  Difficile Quick Screen w PCR reflex     Status: None   Collection Time: 04/14/20 10:05 PM   Specimen: STOOL  Result Value Ref Range Status   C Diff antigen NEGATIVE NEGATIVE Final   C Diff toxin NEGATIVE NEGATIVE Final   C Diff interpretation No C. difficile detected.  Final    Comment: Performed at [facility], [address] [place], [place] [address]      Studies: No results found.  Scheduled Meds:  albuterol  2 mg Oral BID   alteplase  4 mg Intracatheter Once   calcium acetate  1,334 mg Oral TID WC   calcium-vitamin D   Oral Daily   Chlorhexidine Gluconate Cloth  6 each Topical Q0600   cholecalciferol  2,000 Units Oral BID    epoetin (EPOGEN/PROCRIT) injection  10,000 Units Subcutaneous Weekly   fluticasone furoate-vilanterol  1 puff Inhalation Daily   And   umeclidinium bromide  1 puff Inhalation Daily   gentamicin cream  1 application Topical Daily   heparin injection (subcutaneous)  5,000 Units Subcutaneous Q8H   insulin aspart  0-5 Units Subcutaneous QHS   insulin aspart  0-6 Units Subcutaneous TID WC   insulin glargine  10 Units Subcutaneous QHS   isosorbide mononitrate  30 mg Oral Daily   levalbuterol  0.63 mg Nebulization BID   multivitamin  1 tablet Oral QHS   pantoprazole  40 mg Oral Daily   vancomycin variable dose per unstable renal function (pharmacist dosing)   Does not apply See admin instructions    Continuous Infusions:  sodium chloride Stopped (04/18/20 0502)   piperacillin-tazobactam (ZOSYN)  IV 2.25 g (04/18/20 1812)     LOS: 6 days     Alma Friendly, MD [facility]  If 7PM-7AM, please contact night-coverage [email or web address] 04/18/2020, 6:36 PM

## 2020-04-18 NOTE — TOC Initial Note (Signed)
Transition of Care [facility]) - Initial/Assessment Note    Patient Details  Name: Thomas [name] MRN: [id number] Date of Birth: [birth year]  Transition [facility] CM/SW Contact:    Beverly Sessions, RN Phone Number: 04/18/2020, 10:31 AM  Clinical Narrative:                 Patient lives at home with his wife.  They live in an in law suite in the home of his daughter and son in law  Daughter and son in law are primary care givers and perform PD in the home  Assessment completed with daughter Jonelle Sidle.   Daughter provides transportation PCP [facility].  Pharmacy - [facility]. Denies issues obtaining medications  Patient has RW, nebulizer, home O2 5L through [place], [place] and lift chair.   PT has assessed patient and recommends SNF.  Daughter is not interested in SNF at this time. States that he has been previously did not do well, and also did not tolerate out patient HD well. Patient would have to transition back to HD in order to be a candidate for SNF  Patient open with [facility].  Daughter wants him to return to home with resumption of home health services at discharge.    I have notified daughter that should they changed their mind and want to pursue SNF to notify me as soon as possible.  As we would have to find and accept a bed at facility, transition to HD, and obtain insurance authorization. She has my direct contact information   Expected Discharge Plan: [place] Barriers to Discharge: Continued Medical Work up   Patient Goals and CMS Choice        Expected Discharge Plan and Services Expected Discharge Plan: [place]   Discharge Planning Services: CM Consult   Living arrangements for the past 2 months: [place] Arranged: RN, PT [facility] Agency: [place] ([facility]) Date [place]: 04/18/20   Representative spoke with at [employer]: Corene Cornea  Prior  Living Arrangements/Services Living arrangements for the past 2 months: [place] Lives with:: Adult Children, Spouse Patient language and need for interpreter reviewed:: Yes Do you feel safe going back to the place where you live?: Yes      Need for Family Participation in Patient Care: Yes (Comment) Care giver support system in place?: Yes (comment) Current home services: DME Criminal Activity/Legal Involvement Pertinent to Current Situation/Hospitalization: No - Comment as needed  Activities of Daily Living Home Assistive Devices/Equipment: Gilford Rile (specify type), Grab bars around toilet, Grab bars in shower ADL Screening (condition at time of admission) Patient's cognitive ability adequate to safely complete daily activities?: Yes Is the patient deaf or have difficulty hearing?: No Does the patient have difficulty seeing, even when wearing glasses/contacts?: No Does the patient have difficulty concentrating, remembering, or making decisions?: Yes Patient able to express need for assistance with ADLs?: Yes Does the patient have difficulty dressing or bathing?: Yes Independently performs ADLs?: Yes (appropriate for developmental age) Does the patient have difficulty walking or climbing stairs?: Yes Weakness of Legs: Both Weakness of Arms/Hands: Both  Permission Sought/Granted                  Emotional Assessment  Orientation: : Fluctuating Orientation (Suspected and/or reported [place])   Psych Involvement: No (comment)  Admission diagnosis:  Peritonitis associated with peritoneal dialysis, initial encounter ([place]) [T85.71XA] Sepsis ([place]) [A41.9] Altered mental status, unspecified altered mental status type [R41.82] Sepsis without acute organ dysfunction, due to unspecified organism [facility]) [A41.9] Patient Active Problem List   Diagnosis Date Noted  . Dialysis-associated peritonitis ([place])   . Goals of care, counseling/discussion   . Palliative care by  specialist   . DNR (do not resuscitate)   . Spontaneous bacterial peritonitis ([place]) 04/12/2020  . Pneumonia due to COVID-19 virus 06/09/2019  . Sepsis ([place]) 06/08/2019  . Thrombocytopenia ([place]) 06/08/2019  . Chronic respiratory failure with hypoxia ([place]) 06/08/2019  . COPD (chronic obstructive pulmonary disease) ([place])   . HTN (hypertension)   . COVID-19 virus infection   . Volume overload 01/26/2019  . Pneumonia 01/25/2019  . Generalized weakness 01/24/2019  . Congestive heart failure ([place]) 05/11/2016  . Venous stenosis of left upper extremity 05/11/2016  . End stage renal disease ([place]) 06/19/2013  . Rectal bleeding 11/27/2012  . Upper abdominal pain 07/07/2012  . Anemia 07/07/2012  . Chronic nausea 07/07/2012  . Microscopic colitis 03/07/2012  . Diarrhea of presumed infectious origin 02/01/2010  . DIVERTICULITIS, HX OF 02/01/2010  . CONSTIPATION 09/10/2008  . ABDOMINAL PAIN, LOWER 09/10/2008  . Insulin-requiring or dependent type II diabetes mellitus ([place]) 09/09/2008  . CIGARETTE SMOKER 09/09/2008  . Coronary atherosclerosis 09/09/2008  . 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 Esophageal reflux 09/09/2008  . HIATAL HERNIA 09/09/2008  . HOARSENESS 09/09/2008   PCP:  Medicine, [facility]:   [place], [place] [address] [place] [address] Phone: [phone] Fax: [phone]     Social Determinants of Health (SDOH) Interventions    Readmission Risk Interventions Readmission Risk Prevention Plan 04/18/2020 01/30/2019  Transportation Screening Complete Complete  PCP or Specialist Appt within 3-5 Days - Complete  HRI or [place] Complete Complete  Social Work Consult for [place] Planning/Counseling - Complete  Palliative Care Screening - Not Applicable  Medication Review Press photographer) Complete Complete  Some recent data might be hidden

## 2020-04-18 NOTE — Care Management Important Message (Signed)
Important Message  Patient Details  Name: [name] MRN: [id number] Date of Birth: 1941-05-12   Medicare Important Message Given:  Yes     Dannette Barbara 04/18/2020, 11:16 AM

## 2020-04-18 NOTE — Progress Notes (Signed)
PT Cancellation Note  Patient Details Name: [name] MRN: [id number] DOB: [birth year]   Cancelled Treatment:    Reason Eval/Treat Not Completed: Patient at procedure or test/unavailable.  Pt currently at dialysis (not available for PT session).  Will re-attempt PT session at a later date/time.  Leitha Bleak, PT 04/18/20, 12:04 PM

## 2020-04-18 NOTE — Progress Notes (Signed)
[facility], [place] 04/18/20  Subjective:   LOS: 6 Patient resting in bed, in no acute distress. His abdomen still appears distended, but denies abdominal  pain or tenderness. No reported nausea, vomiting or SOB.He continues on O2 6L via mask.    Objective:  Vital signs in last 24 hours:  Temp:  [97.5 F (36.4 C)-98.8 F (37.1 C)] 98.8 F (37.1 C) (11/15 1118) Pulse Rate:  [82-86] 86 (11/15 0818) Resp:  [18-20] 20 (11/15 0818) BP: (125-178)/(43-63) 151/60 (11/15 1118) SpO2:  [94 %-97 %] 94 % (11/15 0851) FiO2 (%):  [35 %] 35 % (11/15 0851) Weight:  [93.7 kg] 93.7 kg (11/15 0455)  Weight change: 0.3 kg Filed Weights   04/16/20 0500 04/17/20 0500 04/18/20 0455  Weight: 92.3 kg 93.4 kg 93.7 kg    Intake/Output:    Intake/Output Summary (Last 24 hours) at 04/18/2020 1327 Last data filed at 04/18/2020 0529 Gross per 24 hour  Intake 894.54 ml  Output --  Net 894.54 ml     Physical Exam: General: In no acute distress  HEENT Rhinectomy, turbinates visible and pink  Pulm/lungs Respiration even,unlabored,Fine crackles + at the bases  CVS/Heart Regular rate and rhythm  Abdomen:  Distended + +   Extremities: Trace peripheral edema  Neurologic: Oriented x 3, speech clear and appropriate  Skin: No rashes or lesions noted  Access: Peritoneal catheter with dressing clean,dry and intact   LUA AVF +bruit,+thrill    Basic Metabolic Panel:  Recent Labs  Lab 04/13/20 0119 04/13/20 0300 04/14/20 0416 04/14/20 0416 04/15/20 0448 04/15/20 0448 04/16/20 0408 04/17/20 0629 04/18/20 0451  NA  --    < > 132*  --  131*  --  137 138 139  K  --    < > 3.7  --  3.1*  --  3.4* 3.5 3.6  CL  --    < > 88*  --  85*  --  94* 96* 99  CO2  --    < > 28  --  27  --  26 24 23   GLUCOSE  --    < > 211*  --  145*  --  148* 123* 164*  BUN  --    < > 62*  --  73*  --  46* 55* 60*  CREATININE  --    < > 9.42*  --  10.62*  --  8.22* 9.56* 10.60*  CALCIUM  --     < > 8.3*   < > 8.1*   < > 8.5* 8.6* 8.8*  MG 1.7  --   --   --   --   --   --   --   --   PHOS  --   --  4.9*  --  6.0*  --  4.1 4.7* 5.0*  5.1*   < > = values in this interval not displayed.     CBC: Recent Labs  Lab 04/14/20 0416 04/15/20 0448 04/16/20 0408 04/17/20 0629 04/18/20 0451  WBC 15.8* 12.1* 8.7 7.7 8.2  NEUTROABS 13.3* 9.9* 6.8 5.2 5.3  HGB 10.6* 11.4* 9.9* 10.6* 9.9*  HCT 33.3* 35.1* 30.3* 33.3* 30.2*  MCV 84.1 83.2 82.3 83.5 82.1  PLT 264 302 307 318 315      Lab Results  Component Value Date   HEPBSAG Negative 01/25/2019      Microbiology:  Recent Results (from the past 240 hour(s))  Culture, blood (routine x 2)  Status: None   Collection Time: 04/12/20  9:33 PM   Specimen: BLOOD  Result Value Ref Range Status   Specimen Description BLOOD RIGHT ASSIST CONTROL  Final   Special Requests   Final    BOTTLES DRAWN AEROBIC AND ANAEROBIC Blood Culture results may not be optimal due to an inadequate volume of blood received in culture bottles   Culture   Final    NO GROWTH 5 DAYS Performed at [facility], [address] [place], [place] [address]    Report Status 04/17/2020 FINAL  Final  Respiratory Panel by RT PCR (Flu A&B, Covid) - Nasopharyngeal Swab     Status: None   Collection Time: 04/12/20  9:38 PM   Specimen: Nasopharyngeal Swab  Result Value Ref Range Status   SARS Coronavirus 2 by RT PCR NEGATIVE NEGATIVE Final    Comment: (NOTE) SARS-CoV-2 target nucleic acids are NOT DETECTED.  The SARS-CoV-2 RNA is generally detectable in upper respiratoy specimens during the acute phase of infection. The lowest concentration of SARS-CoV-2 viral copies this assay can detect is 131 copies/mL. A negative result does not preclude SARS-Cov-2 infection and should not be used as the sole basis for treatment or other patient management decisions. A negative result may occur with  improper specimen collection/handling, submission of specimen  other than nasopharyngeal swab, presence of viral mutation(s) within the areas targeted by this assay, and inadequate number of viral copies (<131 copies/mL). A negative result must be combined with clinical observations, patient history, and epidemiological information. The expected result is Negative.  Fact Sheet for Patients:  PinkCheek.be  Fact Sheet for Healthcare Providers:  GravelBags.it  This test is no t yet approved or cleared by the [place] FDA and  has been authorized for detection and/or diagnosis of SARS-CoV-2 by FDA under an Emergency Use Authorization (EUA). This EUA will remain  in effect (meaning this test can be used) for the duration of the COVID-19 declaration under Section 564(b)(1) of the Act, 21 U.S.C. section 360bbb-3(b)(1), unless the authorization is terminated or revoked sooner.     Influenza A by PCR NEGATIVE NEGATIVE Final   Influenza B by PCR NEGATIVE NEGATIVE Final    Comment: (NOTE) The Xpert Xpress SARS-CoV-2/FLU/RSV assay is intended as an aid in  the diagnosis of influenza from Nasopharyngeal swab specimens and  should not be used as a sole basis for treatment. Nasal washings and  aspirates are unacceptable for Xpert Xpress SARS-CoV-2/FLU/RSV  testing.  Fact Sheet for Patients: PinkCheek.be  Fact Sheet for Healthcare Providers: GravelBags.it  This test is not yet approved or cleared by the [place] FDA and  has been authorized for detection and/or diagnosis of SARS-CoV-2 by  FDA under an Emergency Use Authorization (EUA). This EUA will remain  in effect (meaning this test can be used) for the duration of the  Covid-19 declaration under Section 564(b)(1) of the Act, 21  U.S.C. section 360bbb-3(b)(1), unless the authorization is  terminated or revoked. Performed at [facility], [address]  [place], [place] [address]   Peritoneal Fluid culture ( includes gram stain)     Status: None   Collection Time: 04/12/20 10:28 PM   Specimen: Peritoneal Washings; Peritoneal Fluid  Result Value Ref Range Status   Specimen Description   Final    PERITONEAL Performed at [facility], [address], [place], [place] [address]    Special Requests   Final    NONE Performed at [facility],  [place], [place] [address]    Gram Stain   Final    ABUNDANT WBC PRESENT, PREDOMINANTLY PMN NO ORGANISMS SEEN    Culture   Final    NO GROWTH Performed at [facility] [address], [place], [place] [address]    Report Status 04/16/2020 FINAL  Final  Culture, blood (Routine X 2) w Reflex to ID Panel     Status: None   Collection Time: 04/13/20  6:40 PM   Specimen: BLOOD  Result Value Ref Range Status   Specimen Description BLOOD RIGHT ANTECUBITAL  Final   Special Requests   Final    BOTTLES DRAWN AEROBIC AND ANAEROBIC Blood Culture adequate volume   Culture   Final    NO GROWTH 5 DAYS Performed at [facility], [address], [place], [place] [address]    Report Status 04/18/2020 FINAL  Final  C Difficile Quick Screen w PCR reflex     Status: None   Collection Time: 04/14/20 10:05 PM   Specimen: STOOL  Result Value Ref Range Status   C Diff antigen NEGATIVE NEGATIVE Final   C Diff toxin NEGATIVE NEGATIVE Final   C Diff interpretation No C. difficile detected.  Final    Comment: Performed at [facility], [address] [place], [place] [address]    Coagulation Studies: No results for input(s): LABPROT, INR in the last 72 hours.  Urinalysis: No results for input(s): COLORURINE, LABSPEC, PHURINE, GLUCOSEU, HGBUR, BILIRUBINUR, KETONESUR, PROTEINUR, UROBILINOGEN, NITRITE, LEUKOCYTESUR in the last 72 hours.  Invalid input(s): APPERANCEUR    Imaging: No results found.   Medications:   . sodium chloride Stopped (04/18/20  0502)  . piperacillin-tazobactam (ZOSYN)  IV 2.25 g (04/18/20 0502)   . albuterol  2 mg Oral BID  . calcium acetate  1,334 mg Oral TID WC  . calcium-vitamin D   Oral Daily  . Chlorhexidine Gluconate Cloth  6 each Topical Q0600  . cholecalciferol  2,000 Units Oral BID  . epoetin (EPOGEN/PROCRIT) injection  10,000 Units Subcutaneous Weekly  . fluticasone furoate-vilanterol  1 puff Inhalation Daily   And  . umeclidinium bromide  1 puff Inhalation Daily  . gentamicin cream  1 application Topical Daily  . heparin injection (subcutaneous)  5,000 Units Subcutaneous Q8H  . insulin aspart  0-5 Units Subcutaneous QHS  . insulin aspart  0-6 Units Subcutaneous TID WC  . insulin glargine  10 Units Subcutaneous QHS  . isosorbide mononitrate  30 mg Oral Daily  . levalbuterol  0.63 mg Nebulization BID  . multivitamin  1 tablet Oral QHS  . pantoprazole  40 mg Oral Daily  . vancomycin variable dose per unstable renal function (pharmacist dosing)   Does not apply See admin instructions   sodium chloride, acetaminophen **OR** acetaminophen, albuterol, calcium acetate **AND** calcium acetate, heparin, hydrALAZINE, ondansetron **OR** ondansetron (ZOFRAN) IV  Assessment/ Plan:  79 y.o. male with ESRD on PD, COPD, Diverticular disease, HTN, Covid 19 infection in Jan 2021, HLD, hypothyroidism, nasal mass removal, h/o Sub dural hematoma, OSA was admitted on 04/12/2020 for  Principal Problem:   Sepsis ([place]) Active Problems:   Coronary atherosclerosis   Esophageal reflux   End stage renal disease (H[facility])   COPD (chronic obstructive pulmonary disease) ([place])   Spontaneous bacterial peritonitis ([place])   Dialysis-associated peritonitis ([place])   Goals of care, counseling/discussion   Palliative care by specialist   DNR (do not resuscitate)  Peritonitis associated with peritoneal dialysis,  initial encounter ([place]) [T85.71XA] Sepsis ([place]) [A41.9] Altered mental status, unspecified altered mental status type  [R41.82] Sepsis without acute organ dysfunction, due to unspecified organism ([place]) [A41.9]  #. ESRD Patient is receiving hemodialysis as he was having problems with peritoneal dialysis sessions  Plan for HD today Will continue monitoring  # Acute Peritonitis No abdominal pain or distension Patient is on antibiotics, Vancomycin and Zocyn Blood cultures and peritoneal fluid cultures normal  #. Anemia of CKD  Lab Results  Component Value Date   HGB 9.9 (L) 04/18/2020  At goal, will continue Epogen  #. Secondary hyperparathyroidism of renal origin N 25.81      Component Value Date/Time   PTH 218 (H) 01/26/2019 1228   Lab Results  Component Value Date   PHOS 5.1 (H) 04/18/2020   PHOS 5.0 (H) 04/18/2020  Will continue Phoslo and Calcium supplements  #. Diabetes type 2 with CKD Hgb A1c MFr Bld (%)  Date Value  04/13/2020 7.5 (H)  Patient is on Insulin Aspart and Insulin Glargine  #Hyponatremia Sodium normalized to 139  #Hypokalemia  Potassium 3.6    LOS: 6 Gaither Biehn 11/15/20211:27 PM  [facility] [place], [place]

## 2020-04-19 LAB — CBC WITH DIFFERENTIAL/PLATELET
Abs Immature Granulocytes: 0.82 10*3/uL — ABNORMAL HIGH (ref 0.00–0.07)
Basophils Absolute: 0.1 10*3/uL (ref 0.0–0.1)
Basophils Relative: 1 %
Eosinophils Absolute: 0.6 10*3/uL — ABNORMAL HIGH (ref 0.0–0.5)
Eosinophils Relative: 5 %
HCT: 32 % — ABNORMAL LOW (ref 39.0–52.0)
Hemoglobin: 10.1 g/dL — ABNORMAL LOW (ref 13.0–17.0)
Immature Granulocytes: 8 %
Lymphocytes Relative: 17 %
Lymphs Abs: 1.8 10*3/uL (ref 0.7–4.0)
MCH: 26.3 pg (ref 26.0–34.0)
MCHC: 31.6 g/dL (ref 30.0–36.0)
MCV: 83.3 fL (ref 80.0–100.0)
Monocytes Absolute: 0.7 10*3/uL (ref 0.1–1.0)
Monocytes Relative: 7 %
Neutro Abs: 6.6 10*3/uL (ref 1.7–7.7)
Neutrophils Relative %: 62 %
Platelets: 313 10*3/uL (ref 150–400)
RBC: 3.84 MIL/uL — ABNORMAL LOW (ref 4.22–5.81)
RDW: 17.4 % — ABNORMAL HIGH (ref 11.5–15.5)
Smear Review: NORMAL
WBC: 10.5 10*3/uL (ref 4.0–10.5)
nRBC: 0 % (ref 0.0–0.2)

## 2020-04-19 LAB — RENAL FUNCTION PANEL
Albumin: 2.6 g/dL — ABNORMAL LOW (ref 3.5–5.0)
Anion gap: 16 — ABNORMAL HIGH (ref 5–15)
BUN: 26 mg/dL — ABNORMAL HIGH (ref 8–23)
CO2: 26 mmol/L (ref 22–32)
Calcium: 8.7 mg/dL — ABNORMAL LOW (ref 8.9–10.3)
Chloride: 97 mmol/L — ABNORMAL LOW (ref 98–111)
Creatinine, Ser: 6.1 mg/dL — ABNORMAL HIGH (ref 0.61–1.24)
GFR, Estimated: 9 mL/min — ABNORMAL LOW (ref >60)
Glucose, Bld: 119 mg/dL — ABNORMAL HIGH (ref 70–99)
Phosphorus: 3.2 mg/dL (ref 2.5–4.6)
Potassium: 3.4 mmol/L — ABNORMAL LOW (ref 3.5–5.1)
Sodium: 139 mmol/L (ref 135–145)

## 2020-04-19 LAB — GLUCOSE, CAPILLARY
Glucose-Capillary: 117 mg/dL — ABNORMAL HIGH (ref 70–99)
Glucose-Capillary: 256 mg/dL — ABNORMAL HIGH (ref 70–99)
Glucose-Capillary: 162 mg/dL — ABNORMAL HIGH (ref 70–99)

## 2020-04-19 LAB — VANCOMYCIN, RANDOM: Vancomycin Rm: 24

## 2020-04-19 MED ORDER — VANCOMYCIN HCL IN DEXTROSE 1-5 GM/200ML-% IV SOLN
1000.0000 mg | INTRAVENOUS | Status: DC
  Filled 2020-04-19: qty 200

## 2020-04-19 MED ORDER — POTASSIUM CHLORIDE CRYS ER 20 MEQ PO TBCR
40.0000 meq | EXTENDED_RELEASE_TABLET | Freq: Once | ORAL | Status: AC
  Administered 2020-04-19: 40 meq via ORAL
  Filled 2020-04-19: qty 2

## 2020-04-19 NOTE — Progress Notes (Signed)
PT Cancellation Note  Patient Details Name: Doc DOO MRN: [id number] DOB: 05-25-1941   Cancelled Treatment:     PT attempt, PT hold. Pt not appropriate to participate at this time. Currently on PHD. Will continue to follow per POC progressing as able per pt tolerance.   Willette Pa 04/19/2020, 4:00 PM

## 2020-04-19 NOTE — Progress Notes (Addendum)
Pharmacy Antibiotic Note  Thomas [name] is a 79 y.o. male admitted on 04/12/2020 with spontaneous bacterial peritonitis.  Pharmacy was consulted for vancomycin  dosing. 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 There were some issues with PD again last night. It's unclear if there was any filtration during the two failed PD sessions. Because of this he is scheduled for HD today. The patient is noted to be non-anuric. He has had 2 previous vancomycin doses of 2000 mg on 11/9 and 11/11  -pt on Zosyn 2.25gm IV q8h (HD dosing)  (poss. SBP)  Plan:  Day 4 Vanc-  Vanc level post HD yesterday 11/12 @2018 = 39 mcg/ml. Will hold vancomycin again today with HD as patient also did not get full HD session.   Vancomycin level after next HD  Goal vancomycin level 15 - 25 mcg/mL  Vancomycin 1000 mg after each HD when level is in range  Assess vancomycin daily based on HD/PD needs   Subsequent vancomycin levels as clinically indicated  11/16: Vanc level @ 2228 = 24  Will continue pt on Vanc 1 gm IV Q MWF -HD session.   Height: 5' 9.02" (175.3 cm) Weight: 93.4 kg (205 lb 14.6 oz) IBW/kg (Calculated) : 70.74  Temp (24hrs), Avg:98.1 F (36.7 C), Min:97.5 F (36.4 C), Max:98.8 F (37.1 C)  Recent Labs  Lab 04/12/20 2133 04/13/20 0119 04/13/20 0300 04/14/20 0416 04/14/20 0416 04/15/20 0448 04/15/20 2018 04/16/20 0408 04/17/20 0629 04/18/20 0451 04/18/20 2228 04/19/20 0558  WBC 14.2*  --    < > 15.8*   < > 12.1*  --  8.7 7.7 8.2  --  PENDING  CREATININE 9.72*  --    < > 9.42*   < > 10.62*  --  8.22* 9.56* 10.60*  --  6.10*  LATICACIDVEN 2.4* 2.4*  --  2.1*  --   --   --   --   --   --   --   --   VANCORANDOM  --   --   --   --   --   --  39  --   --   --  24  --    < > = values in this interval not displayed.    Estimated Creatinine Clearance: 11.1 mL/min (A) (by C-G formula based on SCr of 6.1 mg/dL (H)).    Antimicrobials this admission: 11/9 Cefepime>>11/10 11/10  Flagyl>> 11/10 11/10 pip/tazo >> 11/10 vanc >>  Microbiology results: 11/9 BCx: NG x 4 days 11/9 peritoneal fluid Cx: NG x 2 days 11/1 C diff: negative 11/9 SARS CoV-2: negative 11/9 influenza A/B: negative   Thank you for allowing pharmacy to be a part of this patient's care.  Orene Desanctis, PharmD, BCPS Clinical Pharmacist 04/19/2020 6:49 AM

## 2020-04-19 NOTE — Discharge Summary (Signed)
Discharge Summary  Thomas [name] JTT:[id number] DOB: 1941/05/30  PCP: [facility]  Admit date: 04/12/2020 Discharge date: 04/19/2020  Time spent: 40 mins  Recommendations for Outpatient Follow-up:  1. Follow-up with PCP in 1 week 2. Nephrology follow-up   Discharge Diagnoses:  Active Hospital Problems   Diagnosis Date Noted  . Sepsis ([place]) 06/08/2019  . Dialysis-associated peritonitis ([place])   . Goals of care, counseling/discussion   . Palliative care by specialist   . DNR (do not resuscitate)   . Spontaneous bacterial peritonitis ([place]) 04/12/2020  . COPD (chronic obstructive pulmonary disease) ([place])   . End stage renal disease ([place]) 06/19/2013  . Coronary atherosclerosis 09/09/2008  . Esophageal reflux 09/09/2008    Resolved Hospital Problems  No resolved problems to display.    Discharge Condition: Stable  Diet recommendation: Heart healthy  Vitals:   04/19/20 1113 04/19/20 1545  BP: (!) 149/58 (!) 142/58  Pulse: 85 85  Resp: 17 18  Temp: 98.6 F (37 C) 98.5 F (36.9 C)  SpO2: 95% (!) 88%    History of present illness:  Thomas E Hookeris a 79 y.o.malewith medical history significant ofend-stage renal disease on peritoneal dialysis, COPD, diverticular disease, hypertension, COVID-19 infection in January of this year, hyperlipidemia, hypothyroidism, nasal mass status post nose removal, history of subdural hematoma and obstructive sleep apnea oxygen dependent with facemask who was brought in with abdominal pain fever and chills. Patient apparently had some issues with his peritoneal dialysis fluid and nonsterileprocedure and exchange of fluid was performed few days ago. Started having significant abdominal pain altered mental status and fever.  Patient admitted for further management.  In the ER, patient was noted to have diffuse abdominal tenderness with rebound consistent with peritonitis. Aspiration of his peritoneal fluid showed cloudy fluid  which has been sent to the lab, peritoneal fluid culture and blood cultures have been taken, started on antibiotics with suspected spontaneous bacterial peritonitis.  Patient admitted for further management.    Today, patient denies any new complaints, denies any abdominal pain, nausea/vomiting, fever/chills, shortness of breath, chest pain.  PD catheter now functioning as per nephrology, stable to discharge to continue PD at home.  Nephrology also arranged outpatient PD RN for IV ceftazidime and vancomycin.  Advised to follow-up with PCP in 1 week as well as nephrology.  Discussed discharge plan with daughter over the phone.     Hospital Course:  Principal Problem:   Sepsis ([place]) Active Problems:   Coronary atherosclerosis   Esophageal reflux   End stage renal disease (H[facility])   COPD (chronic obstructive pulmonary disease) (HCC)   Spontaneous bacterial peritonitis (H[facility])   Dialysis-associated peritonitis (H[facility])   Goals of care, counseling/discussion   Palliative care by specialist   DNR (do not resuscitate)   Severe sepsis likely 2/2 peritonitis associated with PD On admission, temp 100.9, heart rate 110, respiratory rate 22, WBC 14.2, lactic acid 2.4 with altered mental status Currently afebrile, with resolved leukocytosis BC x2 NGTD Peritoneal fluid analysis culture NGTD Nephrology recommended IV ceftazidime and vancomycin intraperitoneal as an outpatient for 10 days, this was arranged with outpatient PD nurse Follow-up with PCP, nephrology  Acute on chronic hypoxic respiratory failure Possible CAP History of COPD Patient noted to be tachypneic, requiring about 6 L of O2 via facemask, 5 L at home Chest x-ray showing multifocal left lung infiltrate, with small associated parapneumonic effusion Completed 7 days of IV Zosyn, continue duonebs, inhalers Supplemental O2  ESRD PD catheter malfunction, resolved (had  2 sessions of HD on 11/12 and 11/15)  Follow-up with  nephrology  Hypokalemia Correction per HD  Anemia of chronic kidney disease/iron deficiency Baseline hemoglobin around 12 Anemia panel showed iron 17, sats 12, ferritin 796 On weekly Epogen Follow-up with nephrology  Hypertension Stable Continue home Imdur  Diabetes mellitus type 2 A1c on 04/13/2020 was 7.5 Continue home insulin regimen  GERD Continue PPI  OSA Continue oxygen via facemask  Obesity Lifestyle modification advised  Goals of care discussion Patient with multiple comorbidities, poor prognosis Palliative consult for further goals of care discussion, now made DNR        Malnutrition Type:      Malnutrition Characteristics:      Nutrition Interventions:      Estimated body mass index is 30.39 kg/m as calculated from the following:   Height as of this encounter: 5' 9.02" (1.753 m).   Weight as of this encounter: 93.4 kg.    Procedures:  None  Consultations: Nephrology   Discharge Exam: BP (!) 142/58 (BP Location: Right Arm)   Pulse 85   Temp 98.5 F (36.9 C)   Resp 18   Ht 5' 9.02" (1.753 m)   Wt 93.4 kg   SpO2 (!) 88%   BMI 30.39 kg/m   General: NAD Cardiovascular: S1, S2 present Respiratory: CTA B Abdomen: Soft, nontender, slightly distended, bowel sounds present    Discharge Instructions You were cared for by a hospitalist during your hospital stay. If you have any questions about your discharge medications or the care you received while you were in the hospital after you are discharged, you can call the unit and asked to speak with the hospitalist on call if the hospitalist that took care of you is not available. Once you are discharged, your primary care physician will handle any further medical issues. Please note that NO REFILLS for any discharge medications will be authorized once you are discharged, as it is imperative that you return to your primary care physician (or establish a relationship with a primary  care physician if you do not have one) for your aftercare needs so that they can reassess your need for medications and monitor your lab values.  Discharge Instructions    Diet - low sodium heart healthy   Complete by: As directed    Increase activity slowly   Complete by: As directed    No wound care   Complete by: As directed      Allergies as of 04/19/2020      Reactions   Ace Inhibitors Anaphylaxis   Swelling in throat and neck   Statins Anaphylaxis, Swelling   Caused feet, tongue and throat to swell.   Codeine Other (See Comments)   Muscle Spasms   Tape Hives      Medication List    STOP taking these medications   acetaminophen 500 MG tablet Commonly known as: TYLENOL   diphenhydrAMINE 25 MG tablet Commonly known as: BENADRYL   diphenhydrAMINE-zinc acetate cream Commonly known as: BENADRYL   lidocaine-prilocaine cream Commonly known as: EMLA   omeprazole 20 MG capsule Commonly known as: PRILOSEC   Stiolto Respimat 2.5-2.5 MCG/ACT Aers Generic drug: Tiotropium Bromide-Olodaterol     TAKE these medications   albuterol 108 (90 Base) MCG/ACT inhaler Commonly known as: VENTOLIN HFA Inhale 1-2 puffs into the lungs every 4 (four) hours as needed for wheezing or shortness of breath.   albuterol (2.5 MG/3ML) 0.083% nebulizer solution Commonly known as: PROVENTIL Take 2.5 mg  by nebulization every 6 (six) hours as needed for wheezing or shortness of breath.   albuterol 2 MG/5ML syrup Commonly known as: VENTOLIN Take 2 mg by mouth in the morning and at bedtime.   ALPRAZolam 1 MG tablet Commonly known as: XANAX Take 1 mg by mouth at bedtime as needed for sleep.   b complex-vitamin c-folic acid 0.8 MG Tabs tablet Take 1 tablet by mouth at bedtime.   CALCIUM + D3 PO Take 1 tablet by mouth daily.   calcium acetate 667 MG capsule Commonly known as: PHOSLO Take 667-1,334 mg by mouth See admin instructions. Take 2 capsules (1334 mg) by mouth with each meal &  take 1 capsule (667 mg) by mouth with each snack.   dicyclomine 20 MG tablet Commonly known as: BENTYL Take 20 mg by mouth See admin instructions. Take 1 tablet (20 mg) by mouth scheduled at night, may take up to 3 additional doses as needed for abdominal issues .   Fish Oil 1000 MG Caps Take 2,000 mg by mouth 2 (two) times a day.   hydrocortisone cream 1 % Apply 1 application topically 2 (two) times daily as needed for itching.   insulin lispro 100 UNIT/ML injection Commonly known as: HUMALOG Inject 0.03 mLs (3 Units total) into the skin 3 (three) times daily with meals. What changed: how much to take   isosorbide mononitrate 30 MG 24 hr tablet Commonly known as: IMDUR Take 30 mg by mouth daily.   Lantus 100 UNIT/ML injection Generic drug: insulin glargine Inject 0.08 mLs (8 Units total) into the skin daily. What changed:   how much to take  when to take this   loperamide 1 MG/5ML solution Commonly known as: IMODIUM Take 1-2 mg by mouth as needed for diarrhea or loose stools.   magnesium oxide 400 MG tablet Commonly known as: MAG-OX Take 400 mg by mouth at bedtime.   Nitrostat 0.4 MG SL tablet Generic drug: nitroGLYCERIN Place 0.4 mg under the tongue every 5 (five) minutes as needed for chest pain.   ondansetron 4 MG tablet Commonly known as: ZOFRAN Take 4 mg by mouth every 6 (six) hours as needed for nausea or vomiting.   pantoprazole 40 MG tablet Commonly known as: PROTONIX Take 40 mg by mouth daily.   REDNESS RELIEF OP Place 1 drop into both eyes daily as needed (redness/ dryness).   Trelegy Ellipta 100-62.5-25 MCG/INH Aepb Generic drug: Fluticasone-Umeclidin-Vilant Inhale 1 puff into the lungs daily.   vitamin B-12 1000 MCG tablet Commonly known as: CYANOCOBALAMIN Take 1,000 mcg by mouth every evening.   Vitamin D3 50 MCG (2000 UT) capsule Take 2,000 Units by mouth 2 (two) times daily.      Allergies  Allergen Reactions  . Ace Inhibitors  Anaphylaxis    Swelling in throat and neck  . Statins Anaphylaxis and Swelling    Caused feet, tongue and throat to swell.  . Codeine Other (See Comments)    Muscle Spasms  . [facility]. Schedule an appointment as soon as possible for a visit in 1 week(s).   Specialty: Internal Medicine Contact information: [place] [address] [phone]                The results of significant diagnostics from this hospitalization (including imaging, microbiology, ancillary and laboratory) are listed below for reference.    Significant Diagnostic Studies: CT Abdomen Pelvis Wo Contrast  Result  Date: 04/12/2020 CLINICAL DATA:  78 year old male with abdominal pain. EXAM: CT ABDOMEN AND PELVIS WITHOUT CONTRAST TECHNIQUE: Multidetector CT imaging of the abdomen and pelvis was performed following the standard protocol without IV contrast. COMPARISON:  CT abdomen pelvis dated 01/20/2020. FINDINGS: Evaluation of this exam is limited in the absence of intravenous contrast. Lower chest: Partially visualized small left pleural effusion with compressive atelectasis of the majority of the visualized left lower lobe versus pneumonia. Clusters of airspace density involving the right lung base noted which may represent atelectasis but concerning for pneumonia. Clinical correlation is recommended. There is 3 vessel coronary vascular calcification. There is pneumoperitoneum with pockets of air primarily in the upper abdomen anteriorly. The pneumoperitoneum is new since the prior CT and although may have been introduced via the peritoneal dialysis catheter, the possibility of bowel perforation is not excluded. Clinical correlation is recommended. There is a small ascites, new since the prior CT. Hepatobiliary: Probable early changes of cirrhosis. There is slight scalloping of the dome of the liver. No intrahepatic biliary dilatation. Cholecystectomy.  Pancreas: Unremarkable. No pancreatic ductal dilatation or surrounding inflammatory changes. Spleen: Normal in size without focal abnormality. Adrenals/Urinary Tract: The adrenal glands unremarkable. Moderate bilateral renal parenchyma atrophy. There is a 3.5 cm right renal upper pole cyst with areas of peripheral calcification. This is similar to prior CT. Several additional smaller renal lesions noted which are not characterized on this noncontrast CT. There is no hydronephrosis on either side. The visualized ureters and urinary bladder appear unremarkable. Stomach/Bowel: There is sigmoid diverticulosis with muscular hypertrophy. Evaluation for possible active inflammation is limited due to ascites. There is no bowel obstruction. The appendix is not visualized due to ascites. Vascular/Lymphatic: Advanced aortoiliac atherosclerotic disease. The IVC is unremarkable. No portal venous gas. There is no adenopathy. Reproductive: The prostate and seminal vesicles are grossly unremarkable. No pelvic mass. Other: Small fat containing umbilical hernia. A peritoneal dialysis catheter is noted with tip in the right hemipelvis in similar position. Musculoskeletal: Degenerative changes of the spine. No acute osseous pathology. IMPRESSION: 1. Pneumoperitoneum, new since the prior CT. Although this may have been introduced via the peritoneal dialysis catheter, the possibility of bowel perforation is not excluded. Clinical correlation is recommended. 2. Sigmoid diverticulosis.  No bowel obstruction. 3. Early changes of cirrhosis. 4. Small ascites, new since the prior CT. Peritoneal dialysis catheter in similar position as the prior CT. 5. Partially visualized small left pleural effusion with left lower lobe atelectasis versus pneumonia. Clusters of airspace density involving the right lung base may represent atelectasis but concerning for pneumonia. Clinical correlation is recommended. 6. Aortic Atherosclerosis (ICD10-I70.0).  These results were called by telephone at the time of interpretation on 04/12/2020 at 11:18 pm to provider [facility] , who verbally acknowledged these results. Electronically Signed   By: Anner Crete M.D.   On: 04/12/2020 23:23   DG Chest 2 View  Result Date: 04/12/2020 CLINICAL DATA:  Dyspnea, fever EXAM: CHEST - 2 VIEW COMPARISON:  01/20/2020 FINDINGS: Lung volumes are small with asymmetric left-sided volume loss again noted. There has developed focal pulmonary infiltrate within the lingula and posterior segment of the left lower lobe, likely infectious in the appropriate clinical setting. Small left pleural effusion is present, possibly representing a parapneumonic effusion. No pneumothorax. Cardiac size within normal limits. No acute bone abnormality. IMPRESSION: Multifocal left lung infiltrate, likely infectious, with small associated parapneumonic effusion. Electronically Signed   By: Fidela Salisbury MD   On: 04/12/2020 23:05  CT Head Wo Contrast  Result Date: 04/12/2020 CLINICAL DATA:  Altered level of consciousness, fell out of bed EXAM: CT HEAD WITHOUT CONTRAST TECHNIQUE: Contiguous axial images were obtained from the base of the skull through the vertex without intravenous contrast. 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 No acute infarct or hemorrhage. Lateral ventricles and midline structures are unremarkable. No new extra-axial fluid collections. No mass effect. Vascular: No hyperdense vessel or unexpected calcification. Skull: Normal. Negative for fracture or focal lesion. Sinuses/Orbits: Extensive sinonasal postsurgical changes again noted, incompletely evaluated. Paranasal sinuses are clear. Other: None. IMPRESSION: 1. No acute infarct or hemorrhage. 2. Decreased size of the right frontal subdural hematoma now  measuring up to 9 mm in thickness, previously 22 mm. No mass effect. Electronically Signed   By: Randa Ngo M.D.   On: 04/12/2020 23:04   CT Cervical Spine Wo Contrast  Result Date: 04/12/2020 CLINICAL DATA:  Fall, neck trauma EXAM: CT CERVICAL SPINE WITHOUT CONTRAST TECHNIQUE: Multidetector CT imaging of the cervical spine was performed without intravenous contrast. Multiplanar CT image reconstructions were also generated. COMPARISON:  None. FINDINGS: The examination is is slightly limited by motion artifact. Alignment: There is straightening of the cervical spine, possibly positional in nature or related to underlying muscular spasm. No listhesis. Skull base and vertebrae: The craniocervical junction is unremarkable. The atlantodental interval is normal. No acute fracture of the cervical spine. No lytic or blastic bone lesion. Soft tissues and spinal canal: No prevertebral fluid or swelling. No visible canal hematoma. Disc levels: Review of the sagittal reformats demonstrates preservation of vertebral body heights. There is intervertebral disc space narrowing and endplate remodeling of H4-R7, most severe at C5-6 and C6-7 in keeping with changes of moderate to severe degenerative disc disease. The prevertebral soft tissues are not thickened. The spinal canal is narrowed with posteriorly oriented disc osteophytes at C5-6 and C6-7 resulting in moderate central canal stenosis. Review of the axial images demonstrates multilevel uncovertebral arthrosis resulting in multilevel mild-to-moderate neural foraminal narrowing, seen bilaterally at C3-4, on the right at C5-6, and bilaterally, left greater than right, at C6-7. Posterior disc osteophyte complex at C5-6 and C6-7 results in moderate central canal stenosis with mild flattening of the thecal sac. Upper chest: Left pleural effusion is noted, partially visualized. Other: None significant IMPRESSION: No acute fracture of the cervical spine. Multilevel  degenerative disc and degenerative joint disease resulting in multilevel neural foraminal narrowing and moderate central canal stenosis as described above. Left pleural effusion noted. Electronically Signed   By: Fidela Salisbury MD   On: 04/12/2020 23:12    Microbiology: Recent Results (from the past 240 hour(s))  Culture, blood (routine x 2)     Status: None   Collection Time: 04/12/20  9:33 PM   Specimen: BLOOD  Result Value Ref Range Status   Specimen Description BLOOD RIGHT ASSIST CONTROL  Final   Special Requests   Final    BOTTLES DRAWN AEROBIC AND ANAEROBIC Blood Culture results may not be optimal due to an inadequate volume of blood received in culture bottles   Culture   Final    NO GROWTH 5 DAYS Performed at [facility], [address], [place], [place] [address]    Report Status 04/17/2020 FINAL  Final  Respiratory Panel by RT PCR (Flu A&B, Covid) - Nasopharyngeal Swab  Status: None   Collection Time: 04/12/20  9:38 PM   Specimen: Nasopharyngeal Swab  Result Value Ref Range Status   SARS Coronavirus 2 by RT PCR NEGATIVE NEGATIVE Final    Comment: (NOTE) SARS-CoV-2 target nucleic acids are NOT DETECTED.  The SARS-CoV-2 RNA is generally detectable in upper respiratoy specimens during the acute phase of infection. The lowest concentration of SARS-CoV-2 viral copies this assay can detect is 131 copies/mL. A negative result does not preclude SARS-Cov-2 infection and should not be used as the sole basis for treatment or other patient management decisions. A negative result may occur with  improper specimen collection/handling, submission of specimen other than nasopharyngeal swab, presence of viral mutation(s) within the areas targeted by this assay, and inadequate number of viral copies (<131 copies/mL). A negative result must be combined with clinical observations, patient history, and epidemiological information. The expected result is Negative.  Fact Sheet  for Patients:  PinkCheek.be  Fact Sheet for Healthcare Providers:  GravelBags.it  This test is no t yet approved or cleared by the [place] FDA and  has been authorized for detection and/or diagnosis of SARS-CoV-2 by FDA under an Emergency Use Authorization (EUA). This EUA will remain  in effect (meaning this test can be used) for the duration of the COVID-19 declaration under Section 564(b)(1) of the Act, 21 U.S.C. section 360bbb-3(b)(1), unless the authorization is terminated or revoked sooner.     Influenza A by PCR NEGATIVE NEGATIVE Final   Influenza B by PCR NEGATIVE NEGATIVE Final    Comment: (NOTE) The Xpert Xpress SARS-CoV-2/FLU/RSV assay is intended as an aid in  the diagnosis of influenza from Nasopharyngeal swab specimens and  should not be used as a sole basis for treatment. Nasal washings and  aspirates are unacceptable for Xpert Xpress SARS-CoV-2/FLU/RSV  testing.  Fact Sheet for Patients: PinkCheek.be  Fact Sheet for Healthcare Providers: GravelBags.it  This test is not yet approved or cleared by the [place] FDA and  has been authorized for detection and/or diagnosis of SARS-CoV-2 by  FDA under an Emergency Use Authorization (EUA). This EUA will remain  in effect (meaning this test can be used) for the duration of the  Covid-19 declaration under Section 564(b)(1) of the Act, 21  U.S.C. section 360bbb-3(b)(1), unless the authorization is  terminated or revoked. Performed at [facility], [address] [place], [place] [address]   Peritoneal Fluid culture ( includes gram stain)     Status: None   Collection Time: 04/12/20 10:28 PM   Specimen: Peritoneal Washings; Peritoneal Fluid  Result Value Ref Range Status   Specimen Description   Final    PERITONEAL Performed at [facility], [address] [place],  [place] [address]    Special Requests   Final    NONE Performed at [facility], [address] [place], [place] [address]    Gram Stain   Final    ABUNDANT WBC PRESENT, PREDOMINANTLY PMN NO ORGANISMS SEEN    Culture   Final    NO GROWTH Performed at [facility], [place] [address] [place], [place] [address]    Report Status 04/16/2020 FINAL  Final  Culture, blood (Routine X 2) w Reflex to ID Panel     Status: None   Collection Time: 04/13/20  6:40 PM   Specimen: BLOOD  Result Value Ref Range Status   Specimen Description BLOOD RIGHT ANTECUBITAL  Final   Special Requests   Final  BOTTLES DRAWN AEROBIC AND ANAEROBIC Blood Culture adequate volume   Culture   Final    NO GROWTH 5 DAYS Performed at [facility], [address] [place], [place] [address]    Report Status 04/18/2020 FINAL  Final  C Difficile Quick Screen w PCR reflex     Status: None   Collection Time: 04/14/20 10:05 PM   Specimen: STOOL  Result Value Ref Range Status   C Diff antigen NEGATIVE NEGATIVE Final   C Diff toxin NEGATIVE NEGATIVE Final   C Diff interpretation No C. difficile detected.  Final    Comment: Performed at [facility], [address] [place], [place] [address]     Labs: Basic Metabolic Panel: Recent Labs  Lab 04/13/20 0119 04/13/20 0300 04/15/20 0448 04/16/20 0408 04/17/20 0629 04/18/20 0451 04/19/20 0558  NA  --    < > 131* 137 138 139 139  K  --    < > 3.1* 3.4* 3.5 3.6 3.4*  CL  --    < > 85* 94* 96* 99 97*  CO2  --    < > 27 26 24 23 26   GLUCOSE  --    < > 145* 148* 123* 164* 119*  BUN  --    < > 73* 46* 55* 60* 26*  CREATININE  --    < > 10.62* 8.22* 9.56* 10.60* 6.10*  CALCIUM  --    < > 8.1* 8.5* 8.6* 8.8* 8.7*  MG 1.7  --   --   --   --   --   --   PHOS  --    < > 6.0* 4.1 4.7* 5.0*  5.1* 3.2   < > = values in this interval not displayed.   Liver Function Tests: Recent Labs  Lab 04/12/20 2133 04/12/20 2133 04/13/20 0300  04/14/20 0416 04/15/20 0448 04/16/20 0408 04/17/20 0629 04/18/20 0451 04/19/20 0558  AST 19  --  17  --   --   --   --   --   --   ALT 15  --  13  --   --   --   --   --   --   ALKPHOS 137*  --  105  --   --   --   --   --   --   BILITOT 0.7  --  0.9  --   --   --   --   --   --   PROT 7.2  --  6.2*  --   --   --   --   --   --   ALBUMIN 2.9*   < > 2.5*   < > 2.2* 2.4* 2.3* 2.2* 2.6*   < > = values in this interval not displayed.   No results for input(s): LIPASE, AMYLASE in the last 168 hours. No results for input(s): AMMONIA in the last 168 hours. CBC: Recent Labs  Lab 04/15/20 0448 04/16/20 0408 04/17/20 0629 04/18/20 0451 04/19/20 0558  WBC 12.1* 8.7 7.7 8.2 10.5  NEUTROABS 9.9* 6.8 5.2 5.3 6.6  HGB 11.4* 9.9* 10.6* 9.9* 10.1*  HCT 35.1* 30.3* 33.3* 30.2* 32.0*  MCV 83.2 82.3 83.5 82.1 83.3  PLT 302 307 318 315 313   Cardiac Enzymes: No results for input(s): CKTOTAL, CKMB, CKMBINDEX, TROPONINI in the last 168 hours. BNP: BNP (last 3 results) No results for input(s): BNP in the last 8760 hours.  ProBNP (last 3  results) No results for input(s): PROBNP in the last 8760 hours.  CBG: Recent Labs  Lab 04/18/20 0748 04/18/20 1608 04/18/20 2044 04/19/20 0827 04/19/20 1223  GLUCAP 152* 121* 169* 117* 256*       Signed:  Alma Friendly, MD [facility] 04/19/2020, 3:57 PM

## 2020-04-19 NOTE — Progress Notes (Signed)
[facility], [place] 04/19/20  Subjective:   LOS: 7 Patient appears pleasant and comfortable.  He reports appetite is poor, but denies nausea or vomiting . We are planning for a session of peritoneal dialysis during the day today to better trouble shoot if any malfunctioning occurs during the treatment.    Objective:  Vital signs in last 24 hours:  Temp:  [97.5 F (36.4 C)-98.6 F (37 C)] 98.6 F (37 C) (11/16 1113) Pulse Rate:  [81-91] 85 (11/16 1113) Resp:  [14-19] 17 (11/16 1113) BP: (116-159)/(46-81) 149/58 (11/16 1113) SpO2:  [91 %-97 %] 95 % (11/16 1113) FiO2 (%):  [35 %] 35 % (11/16 0825) Weight:  [93.4 kg] 93.4 kg (11/16 0500)  Weight change: -0.3 kg Filed Weights   04/17/20 0500 04/18/20 0455 04/19/20 0500  Weight: 93.4 kg 93.7 kg 93.4 kg    Intake/Output:    Intake/Output Summary (Last 24 hours) at 04/19/2020 1127 Last data filed at 04/18/2020 1500 Gross per 24 hour  Intake --  Output 2000 ml  Net -2000 ml     Physical Exam: General:  Resting in bed, in no acute distress  HEENT Rhinectomy, turbinates visible and pink  Pulm/lungs  lungs with fine crackles bilaterally, continues to be on supplemental O2 via mask,Respiration symmetrical, unlabored  CVS/Heart  S1-S2, no rubs or gallops  Abdomen:   Distended  Extremities: No peripheral edema  Neurologic: Awake, alert,oriented  Skin: No acute rashes or lesions noted  Access: Peritoneal catheter with dressing clean,dry and intact   LUA AVF +bruit,+thrill    Basic Metabolic Panel:  Recent Labs  Lab 04/13/20 0119 04/13/20 0300 04/15/20 0448 04/15/20 0448 04/16/20 0408 04/16/20 0408 04/17/20 0629 04/18/20 0451 04/19/20 0558  NA  --    < > 131*  --  137  --  138 139 139  K  --    < > 3.1*  --  3.4*  --  3.5 3.6 3.4*  CL  --    < > 85*  --  94*  --  96* 99 97*  CO2  --    < > 27  --  26  --  24 23 26   GLUCOSE  --    < > 145*  --  148*  --  123* 164* 119*  BUN  --    <  > 73*  --  46*  --  55* 60* 26*  CREATININE  --    < > 10.62*  --  8.22*  --  9.56* 10.60* 6.10*  CALCIUM  --    < > 8.1*   < > 8.5*   < > 8.6* 8.8* 8.7*  MG 1.7  --   --   --   --   --   --   --   --   PHOS  --    < > 6.0*  --  4.1  --  4.7* 5.0*  5.1* 3.2   < > = values in this interval not displayed.     CBC: Recent Labs  Lab 04/15/20 0448 04/16/20 0408 04/17/20 0629 04/18/20 0451 04/19/20 0558  WBC 12.1* 8.7 7.7 8.2 10.5  NEUTROABS 9.9* 6.8 5.2 5.3 6.6  HGB 11.4* 9.9* 10.6* 9.9* 10.1*  HCT 35.1* 30.3* 33.3* 30.2* 32.0*  MCV 83.2 82.3 83.5 82.1 83.3  PLT 302 307 318 315 313      Lab Results  Component Value Date   HEPBSAG Negative 01/25/2019  Microbiology:  Recent Results (from the past 240 hour(s))  Culture, blood (routine x 2)     Status: None   Collection Time: 04/12/20  9:33 PM   Specimen: BLOOD  Result Value Ref Range Status   Specimen Description BLOOD RIGHT ASSIST CONTROL  Final   Special Requests   Final    BOTTLES DRAWN AEROBIC AND ANAEROBIC Blood Culture results may not be optimal due to an inadequate volume of blood received in culture bottles   Culture   Final    NO GROWTH 5 DAYS Performed at [facility], [address] [place], [place] [address]    Report Status 04/17/2020 FINAL  Final  Respiratory Panel by RT PCR (Flu A&B, Covid) - Nasopharyngeal Swab     Status: None   Collection Time: 04/12/20  9:38 PM   Specimen: Nasopharyngeal Swab  Result Value Ref Range Status   SARS Coronavirus 2 by RT PCR NEGATIVE NEGATIVE Final    Comment: (NOTE) SARS-CoV-2 target nucleic acids are NOT DETECTED.  The SARS-CoV-2 RNA is generally detectable in upper respiratoy specimens during the acute phase of infection. The lowest concentration of SARS-CoV-2 viral copies this assay can detect is 131 copies/mL. A negative result does not preclude SARS-Cov-2 infection and should not be used as the sole basis for treatment or other patient  management decisions. A negative result may occur with  improper specimen collection/handling, submission of specimen other than nasopharyngeal swab, presence of viral mutation(s) within the areas targeted by this assay, and inadequate number of viral copies (<131 copies/mL). A negative result must be combined with clinical observations, patient history, and epidemiological information. The expected result is Negative.  Fact Sheet for Patients:  PinkCheek.be  Fact Sheet for Healthcare Providers:  GravelBags.it  This test is no t yet approved or cleared by the [place] FDA and  has been authorized for detection and/or diagnosis of SARS-CoV-2 by FDA under an Emergency Use Authorization (EUA). This EUA will remain  in effect (meaning this test can be used) for the duration of the COVID-19 declaration under Section 564(b)(1) of the Act, 21 U.S.C. section 360bbb-3(b)(1), unless the authorization is terminated or revoked sooner.     Influenza A by PCR NEGATIVE NEGATIVE Final   Influenza B by PCR NEGATIVE NEGATIVE Final    Comment: (NOTE) The Xpert Xpress SARS-CoV-2/FLU/RSV assay is intended as an aid in  the diagnosis of influenza from Nasopharyngeal swab specimens and  should not be used as a sole basis for treatment. Nasal washings and  aspirates are unacceptable for Xpert Xpress SARS-CoV-2/FLU/RSV  testing.  Fact Sheet for Patients: PinkCheek.be  Fact Sheet for Healthcare Providers: GravelBags.it  This test is not yet approved or cleared by the [place] FDA and  has been authorized for detection and/or diagnosis of SARS-CoV-2 by  FDA under an Emergency Use Authorization (EUA). This EUA will remain  in effect (meaning this test can be used) for the duration of the  Covid-19 declaration under Section 564(b)(1) of the Act, 21  U.S.C. section 360bbb-3(b)(1),  unless the authorization is  terminated or revoked. Performed at [facility], [address] [place], [place] [address]   Peritoneal Fluid culture ( includes gram stain)     Status: None   Collection Time: 04/12/20 10:28 PM   Specimen: Peritoneal Washings; Peritoneal Fluid  Result Value Ref Range Status   Specimen Description   Final    PERITONEAL Performed at [facility], [place],  [place] [address]    Special Requests   Final    NONE Performed at [facility], [address] [place], [place] [address]    Gram Stain   Final    ABUNDANT WBC PRESENT, PREDOMINANTLY PMN NO ORGANISMS SEEN    Culture   Final    NO GROWTH Performed at [facility], [place] [address] [place], [place] [address]    Report Status 04/16/2020 FINAL  Final  Culture, blood (Routine X 2) w Reflex to ID Panel     Status: None   Collection Time: 04/13/20  6:40 PM   Specimen: BLOOD  Result Value Ref Range Status   Specimen Description BLOOD RIGHT ANTECUBITAL  Final   Special Requests   Final    BOTTLES DRAWN AEROBIC AND ANAEROBIC Blood Culture adequate volume   Culture   Final    NO GROWTH 5 DAYS Performed at [facility], [address] [place], [place] [address]    Report Status 04/18/2020 FINAL  Final  C Difficile Quick Screen w PCR reflex     Status: None   Collection Time: 04/14/20 10:05 PM   Specimen: STOOL  Result Value Ref Range Status   C Diff antigen NEGATIVE NEGATIVE Final   C Diff toxin NEGATIVE NEGATIVE Final   C Diff interpretation No C. difficile detected.  Final    Comment: Performed at [facility], [address] [place], [place] [address]    Coagulation Studies: No results for input(s): LABPROT, INR in the last 72 hours.  Urinalysis: No results for input(s): COLORURINE, LABSPEC, PHURINE, GLUCOSEU, HGBUR, BILIRUBINUR, KETONESUR, PROTEINUR, UROBILINOGEN, NITRITE, LEUKOCYTESUR in the last 72 hours.  Invalid  input(s): APPERANCEUR    Imaging: No results found.   Medications:   . sodium chloride Stopped (04/18/20 0502)  . piperacillin-tazobactam (ZOSYN)  IV 2.25 g (04/19/20 0446)  . [START ON 04/20/2020] vancomycin     . albuterol  2 mg Oral BID  . calcium acetate  1,334 mg Oral TID WC  . calcium-vitamin D   Oral Daily  . Chlorhexidine Gluconate Cloth  6 each Topical Q0600  . cholecalciferol  2,000 Units Oral BID  . epoetin (EPOGEN/PROCRIT) injection  10,000 Units Subcutaneous Weekly  . fluticasone furoate-vilanterol  1 puff Inhalation Daily   And  . umeclidinium bromide  1 puff Inhalation Daily  . gentamicin cream  1 application Topical Daily  . heparin injection (subcutaneous)  5,000 Units Subcutaneous Q8H  . insulin aspart  0-5 Units Subcutaneous QHS  . insulin aspart  0-6 Units Subcutaneous TID WC  . insulin glargine  10 Units Subcutaneous QHS  . isosorbide mononitrate  30 mg Oral Daily  . levalbuterol  0.63 mg Nebulization BID  . multivitamin  1 tablet Oral QHS  . pantoprazole  40 mg Oral Daily  . vancomycin variable dose per unstable renal function (pharmacist dosing)   Does not apply See admin instructions   sodium chloride, acetaminophen **OR** acetaminophen, albuterol, calcium acetate **AND** calcium acetate, heparin, hydrALAZINE, ondansetron **OR** ondansetron (ZOFRAN) IV  Assessment/ Plan:  79 y.o. male with ESRD on PD, COPD, Diverticular disease, HTN, Covid 19 infection in Jan 2021, HLD, hypothyroidism, nasal mass removal, h/o Sub dural hematoma, OSA was admitted on 04/12/2020 for  Principal Problem:   Sepsis [facility]) Active Problems:   Coronary atherosclerosis   Esophageal reflux   End stage renal disease ([place])   COPD (chronic obstructive pulmonary disease) ([place])   Spontaneous bacterial peritonitis ([place])  Dialysis-associated peritonitis ([place])   Goals of care, counseling/discussion   Palliative care by specialist   DNR (do not resuscitate)  Peritonitis associated  with peritoneal dialysis, initial encounter ([place]) [T85.71XA] Sepsis ([place]) [A41.9] Altered mental status, unspecified altered mental status type [R41.82] Sepsis without acute organ dysfunction, due to unspecified organism ([place]) [A41.9]  #. ESRD Patient received hemodialysis yesterday, tolerated well We will plan for a session of peritoneal dialysis during the day today Patient prefers to be on peritoneal dialysis at home  # Acute Peritonitis Abdominal pain and distention resolving He continues to be on antibiotics  #. Anemia of CKD  Lab Results  Component Value Date   HGB 10.1 (L) 04/19/2020  No acute indication for Epogen We will continue monitoring CBCs  #. Secondary hyperparathyroidism of renal origin N 25.81      Component Value Date/Time   PTH 218 (H) 01/26/2019 1228   Lab Results  Component Value Date   PHOS 3.2 04/19/2020  We will continue monitoring bone mineral metabolism parameters  #. Diabetes type 2 with CKD Hgb A1c MFr Bld (%)  Date Value  04/13/2020 7.5 (H)  Blood glucose levels within acceptable range during hospitalization   #Hyponatremia Sodium stays stable, 139 today  #Hypokalemia  Potassium 3.4  Klor-Con 40 mEq p.o. today    LOS: 7 Timathy Newberry 11/16/202111:27 AM  [facility] [place], [place]

## 2020-04-19 NOTE — Progress Notes (Signed)
Noted patient to be in Drain 1/5 at this time. Patient drains better while lying on his back and the next phase of treatment resumed to Fill 2/5. Will continue to monitor.

## 2020-04-19 NOTE — Progress Notes (Signed)
Noted patient to continue to be in 1/5 drain phase at this time. PD catheter positional and drains better with patient lying on his back. Writer educated the patient on this at this time. 1/5 drains completed while writer at bedside and tx continued to [place] 3/5 at 1430pm. Dr. Holley Raring made aware of patient having negative UF during treatments.

## 2020-04-19 NOTE — Progress Notes (Signed)
OT Cancellation Note  Patient Details Name: Thomas [name] MRN: [id number] DOB: 06/22/1940   Cancelled Treatment:    Reason Eval/Treat Not Completed: Patient at procedure or test/ unavailable  Pt on peritoneal dialysis at this time. Will f/u for OT treatment at later date/time as able. Thank you.  Gerrianne Scale, [place], OTR/L ascom [phone] 04/19/20, 10:37 AM

## 2020-04-19 NOTE — TOC Transition Note (Signed)
Transition of Care [facility]) - CM/SW Discharge Note   Patient Details  Name: CALDER OBLINGER MRN: [id number] Date of Birth: March 16, 1941  [facility]) CM/SW Contact:  Beverly Sessions, RN Phone Number: 04/19/2020, 4:05 PM   Clinical Narrative:    Patient to discharge home today. Daughter to transport [place] with [place] notified of discharge  Elvera Bicker dialysis liaison notified of discharge Daughter states her local PD nurse has already connected with her about arranging antibiotics with PD tomorrow  Daughter to bring portable O2 for transport    Final next level of care: [place] Barriers to Discharge: No Barriers Identified   Patient Goals and CMS Choice        Discharge Placement                       Discharge Plan and Services   Discharge Planning Services: CM Consult                      HH Arranged: RN, PT, Nurse's Aide [facility] Agency: [place] ([place]) Date [facility] Agency Contacted: 04/19/20   Representative spoke with at [place]: [place] ([place]) Interventions     Readmission Risk Interventions Readmission Risk Prevention Plan 04/18/2020 01/30/2019  Transportation Screening Complete Complete  PCP or Specialist Appt within 3-5 Days - Complete  HRI or [place] Complete Complete  Social Work Consult for [place] Planning/Counseling - Complete  Palliative Care Screening - Not Applicable  Medication Review [employer]) Complete Complete  Some recent data might be hidden

## 2020-04-19 NOTE — Progress Notes (Signed)
Writer spoke with primary RN, T. Elnoria Howard to inform of the patients difficulties with draining unless on his back and his negative UF alarms needing to be bypassed once a certain percentage of fluid drained has been reached. Writer states to primary nurse to call dialysis nurse with any concerns and dialysis nurse can coach on how to bypass this phase once reached. RN states understanding.

## 2020-06-07 IMAGING — XA IR DAILY SHUNT INTRO NEEDLE/INTRACATH INITIAL W/IMG LEFT
1 series · 14 of 24 positions shown · IV contrast (IODINE)
Comparison: none

INDICATION: 78-year-old with end-stage renal disease and left radiocephalic
fistula. Concern for a thrombosed fistula.

[Series 300: dsa body · 14 of 24 slices shown]
[im 1/24]
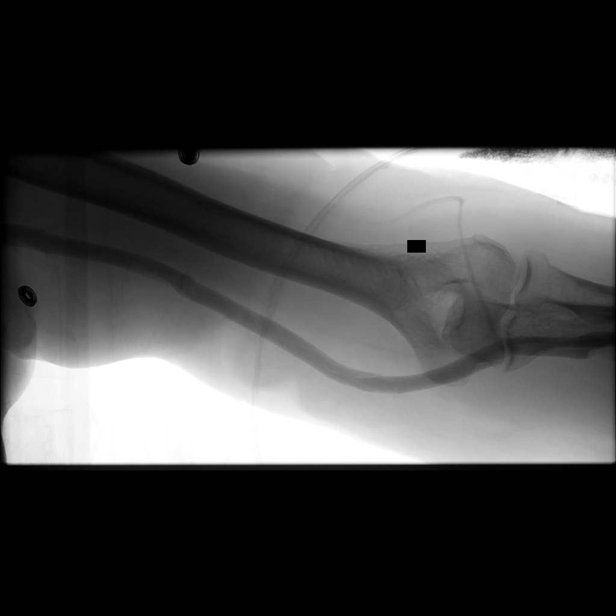
[im 3/24]
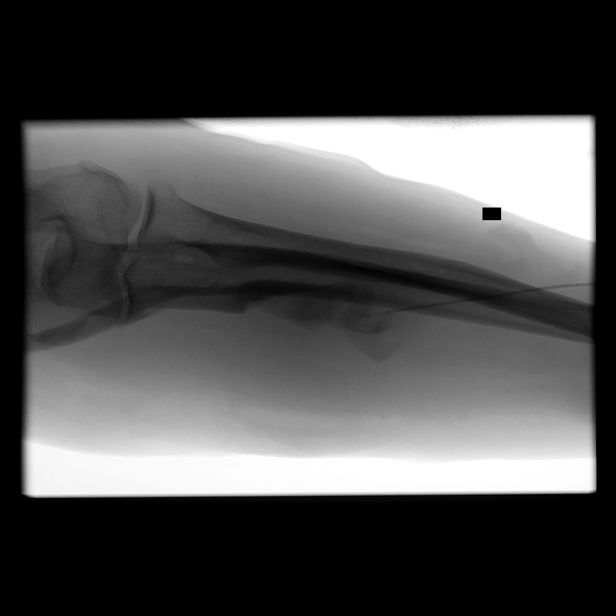
[im 5/24]
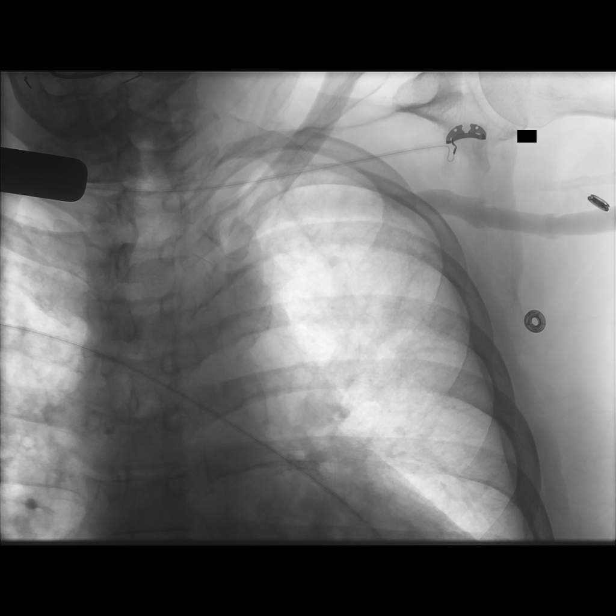
[im 7/24]
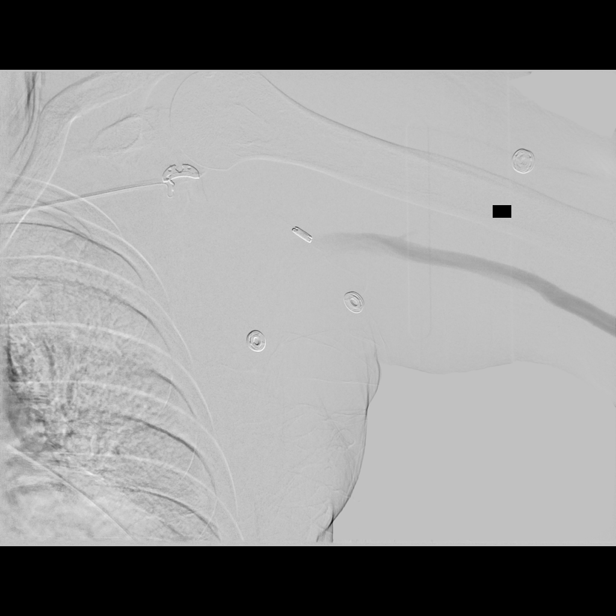
[im 8/24]
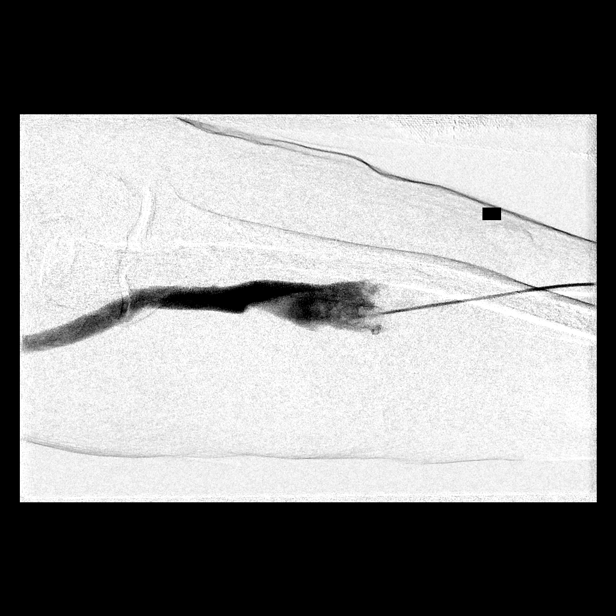
[im 10/24]
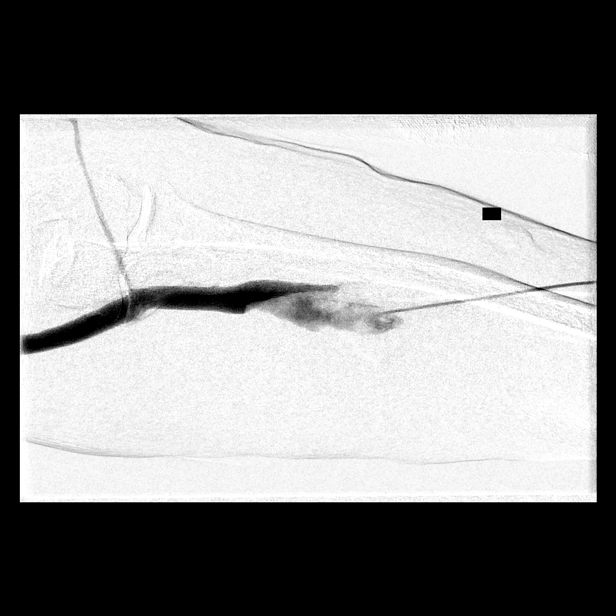
[im 12/24]
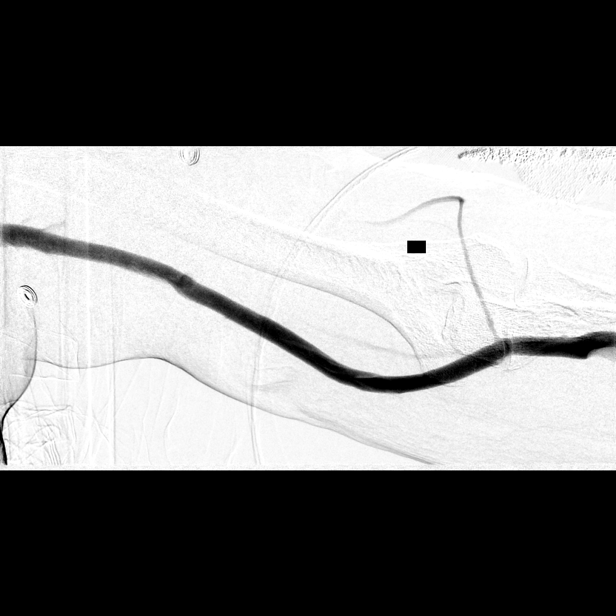
[im 13/24]
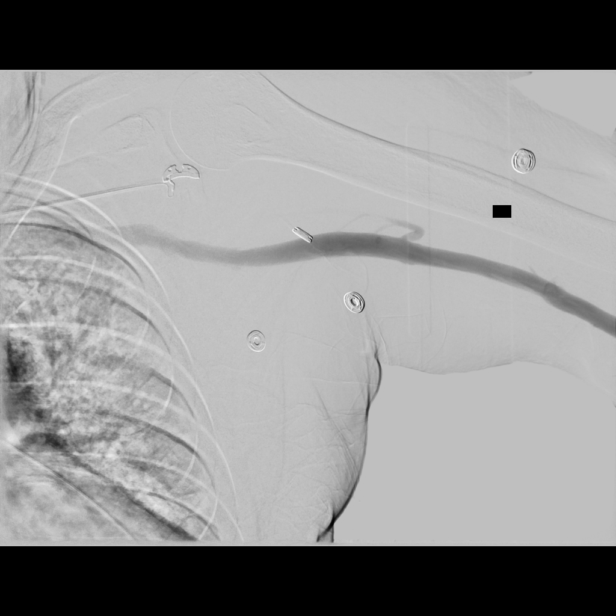
[im 15/24]
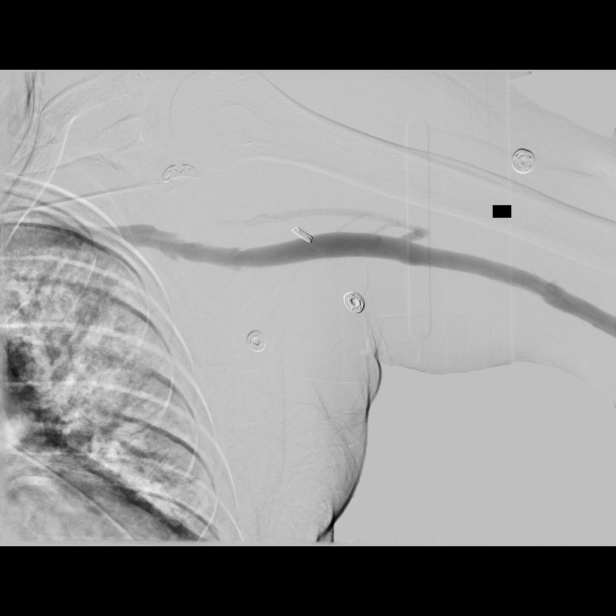
[im 17/24]
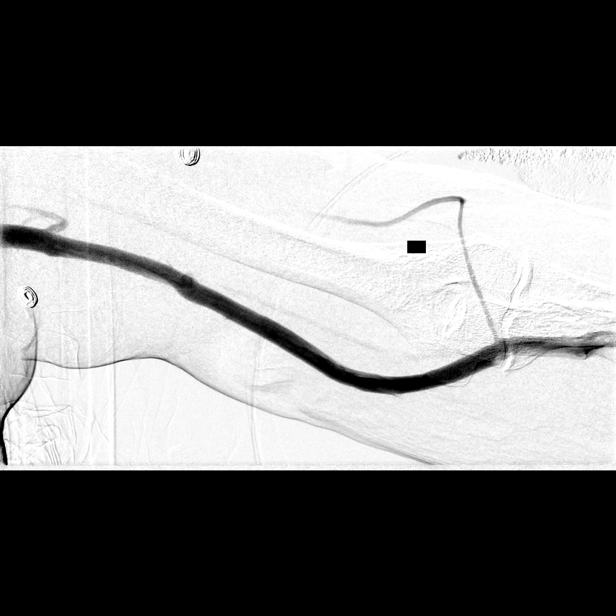
[im 19/24]
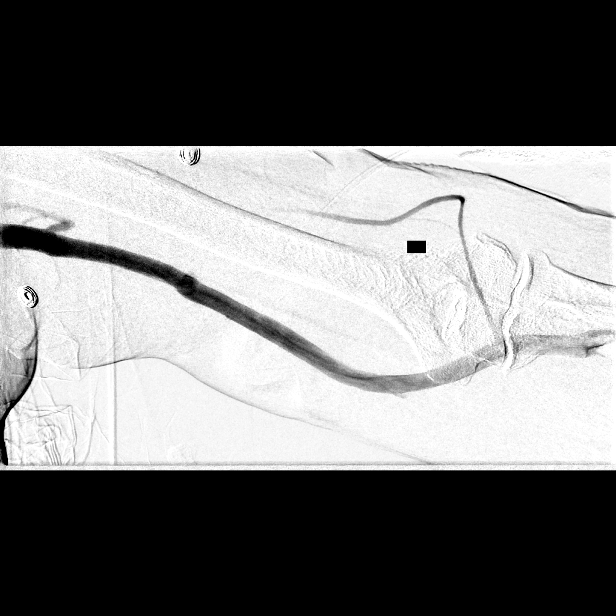
[im 20/24]
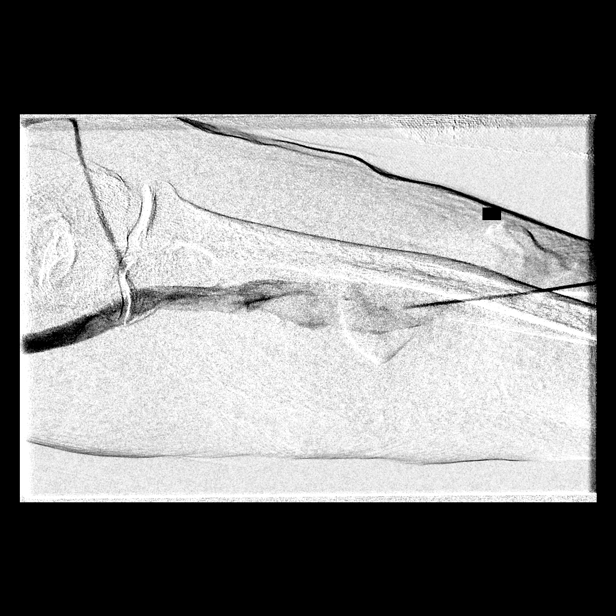
[im 22/24]
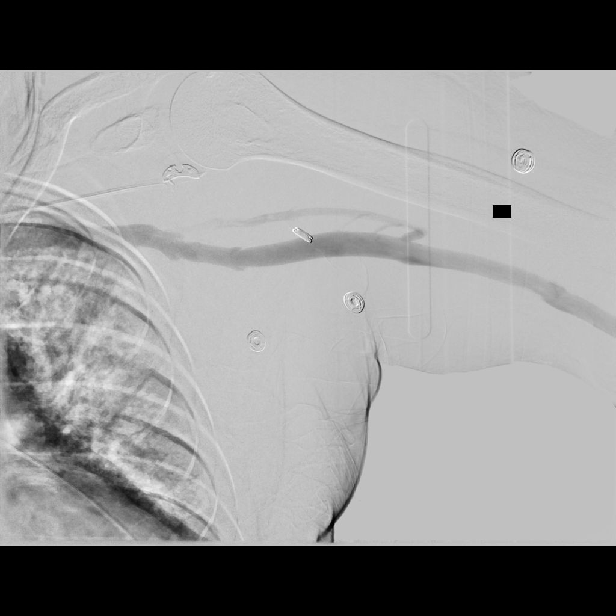
[im 24/24]
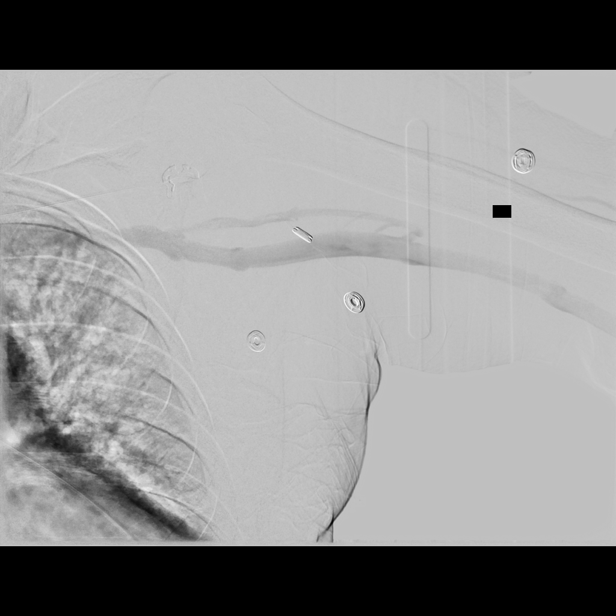

[14 of 24 positions shown; findings below may reference images not displayed]

EXAM:
LEFT UPPER EXTREMITY FISTULOGRAM

ULTRASOUND GUIDANCE FOR VASCULAR ACCESS

MEDICATIONS:
None.

ANESTHESIA/SEDATION:
None

FLUOROSCOPY TIME:  Fluoroscopy Time: 24 seconds, 18 mGy

CONTRAST:  20 mL Omnipaque 300

COMPLICATIONS:
None immediate.

PROCEDURE:
Informed written consent was obtained from the patient after a
thorough discussion of the procedural risks, benefits and
alternatives. All questions were addressed. Maximal Sterile Barrier
Technique was utilized including caps, mask, sterile gowns, sterile
gloves, sterile drape, hand hygiene and skin antiseptic. A timeout
was performed prior to the initiation of the procedure.

The left forearm AV fistula was identified with ultrasound. Skin was
anesthetized with 1% lidocaine. Using ultrasound guidance, the
distal cephalic vein was punctured using a 21 gauge needle and a
micropuncture dilator set was placed. Fistulogram images were
obtained. Unfortunately, the micropuncture catheter was dislodged
while trying to obtain additional images of the distal forearm and
reflux images. Small amount of contrast, less than 10 mL,
extravasated into the soft tissues when the catheter was dislodged.
No additional fistulogram images were obtained after the catheter
was dislodged. Light manual compression was placed over the puncture
site until there was hemostasis. Bandage placed at the puncture
site.
FINDINGS: Left arm fistula is patent. Arterial anastomosis appears to be
widely patent based on ultrasound. Aneurysmal segment of cephalic
vein in the mid forearm contains a large amount of nonocclusive
adherent thrombus along the superficial wall of the vein. The
cephalic vein is widely patent in the left upper arm and the central
veins are widely patent.
IMPRESSION: Left radiocephalic fistula is patent.

Large amount of nonocclusive thrombus within an aneurysmal segment
of the mid forearm cephalic vein. The thrombus is adherent to the
superficial side of the vein and would explain why there is no flow
or poor flow when this area is accessed. As a result, this aneurysm
segment containing the adherent thrombus was demarcated with a pen.
Recommend trying to access the fistula proximal or distal to this
demarcated segment. Reportedly, the patient is scheduled to
follow-up with vascular surgery.

ACCESS:
This access remains amenable to future percutaneous interventions as
clinically indicated.

## 2020-06-14 ENCOUNTER — Encounter (INDEPENDENT_AMBULATORY_CARE_PROVIDER_SITE_OTHER): Payer: Self-pay | Admitting: *Deleted

## 2020-08-02 DEATH — deceased

## 2020-08-30 IMAGING — DX PORTABLE CHEST - 1 VIEW SAME DAY
1 series · 1 of 1 positions shown · non-contrast
Comparison: None.

CLINICAL DATA: Status post dialysis catheter placement

EXAM:
PORTABLE CHEST 1 VIEW

[chest ap]
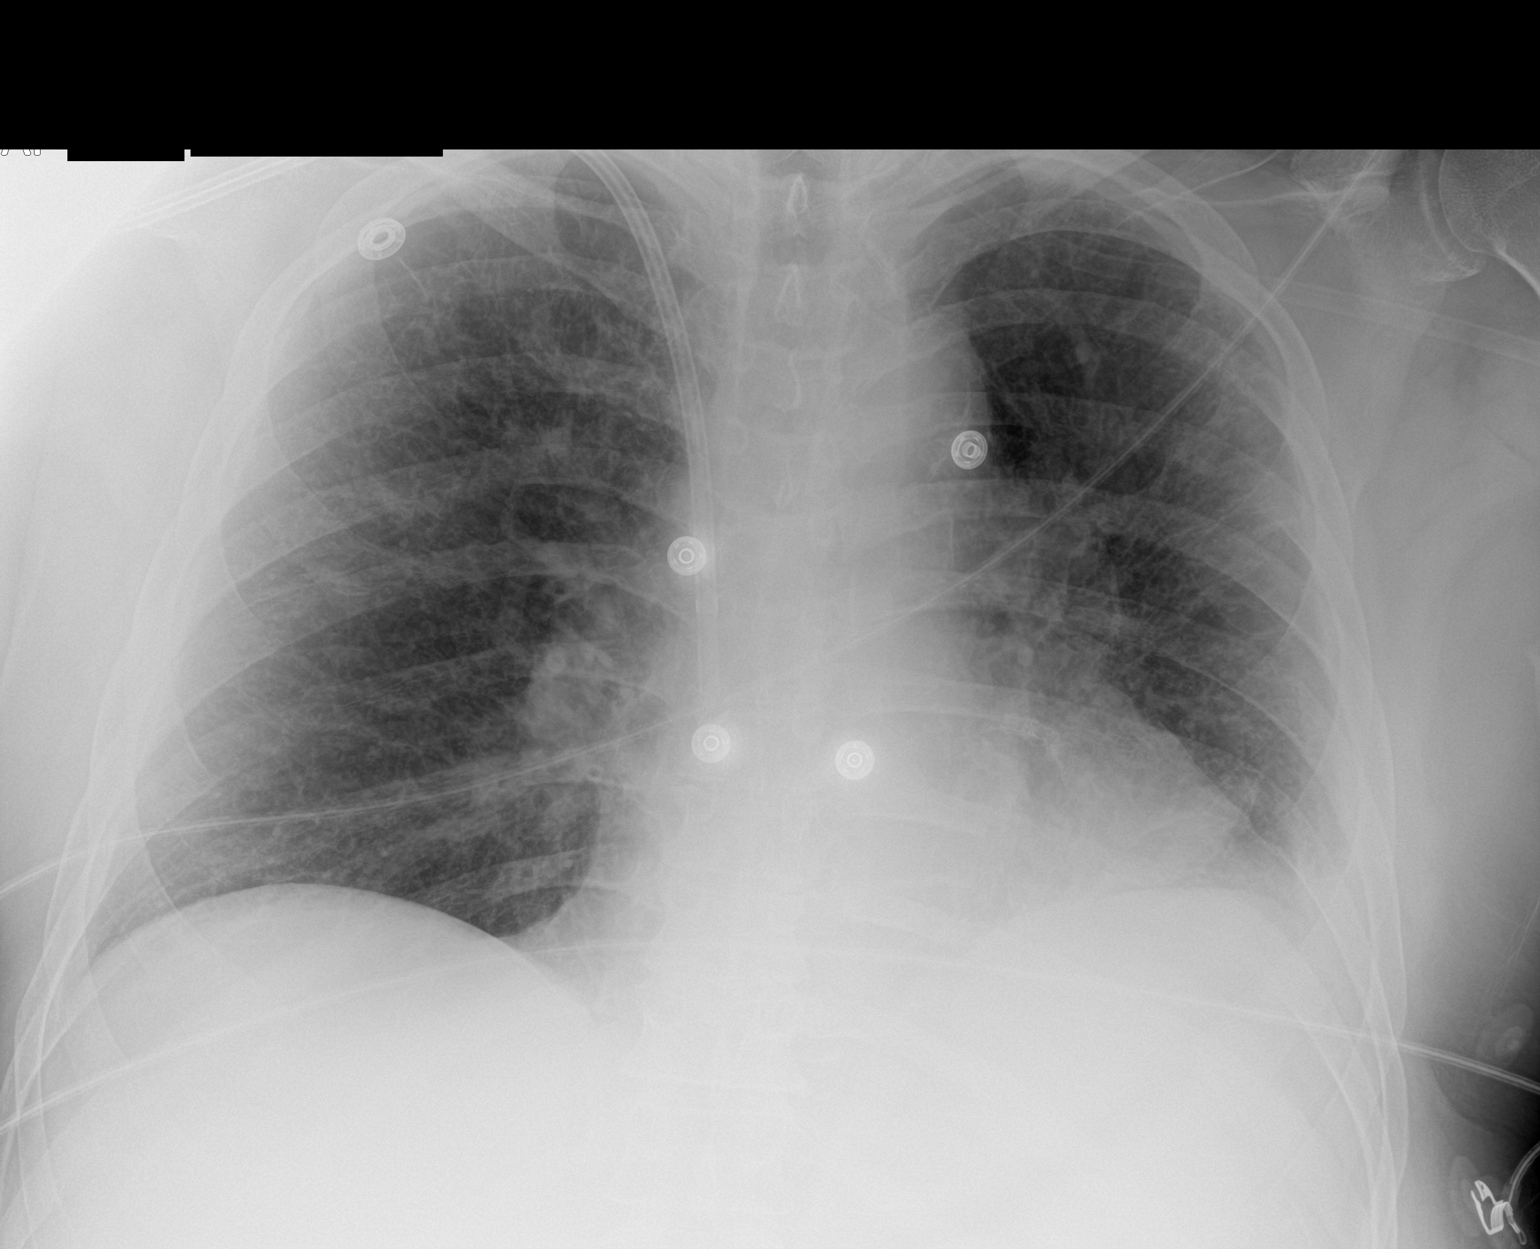

[1 of 1 positions shown; findings below may reference images not displayed]

FINDINGS: Cardiac shadow is within normal limits. Right jugular dialysis
catheter is noted in satisfactory position at the cavoatrial
junction. Lungs are well aerated bilaterally. Mild vascular
congestion is seen.
IMPRESSION: No pneumothorax following central line placement. The catheter tip
is noted in satisfactory position.

Mild vascular congestion is noted.

## 2020-09-02 IMAGING — DX PORTABLE CHEST - 1 VIEW
2 series · 2 of 2 positions shown · non-contrast
Comparison: 01/21/2019

CLINICAL DATA: Fever, weakness

EXAM:
PORTABLE CHEST 1 VIEW

[chest ap (1 of 2)]
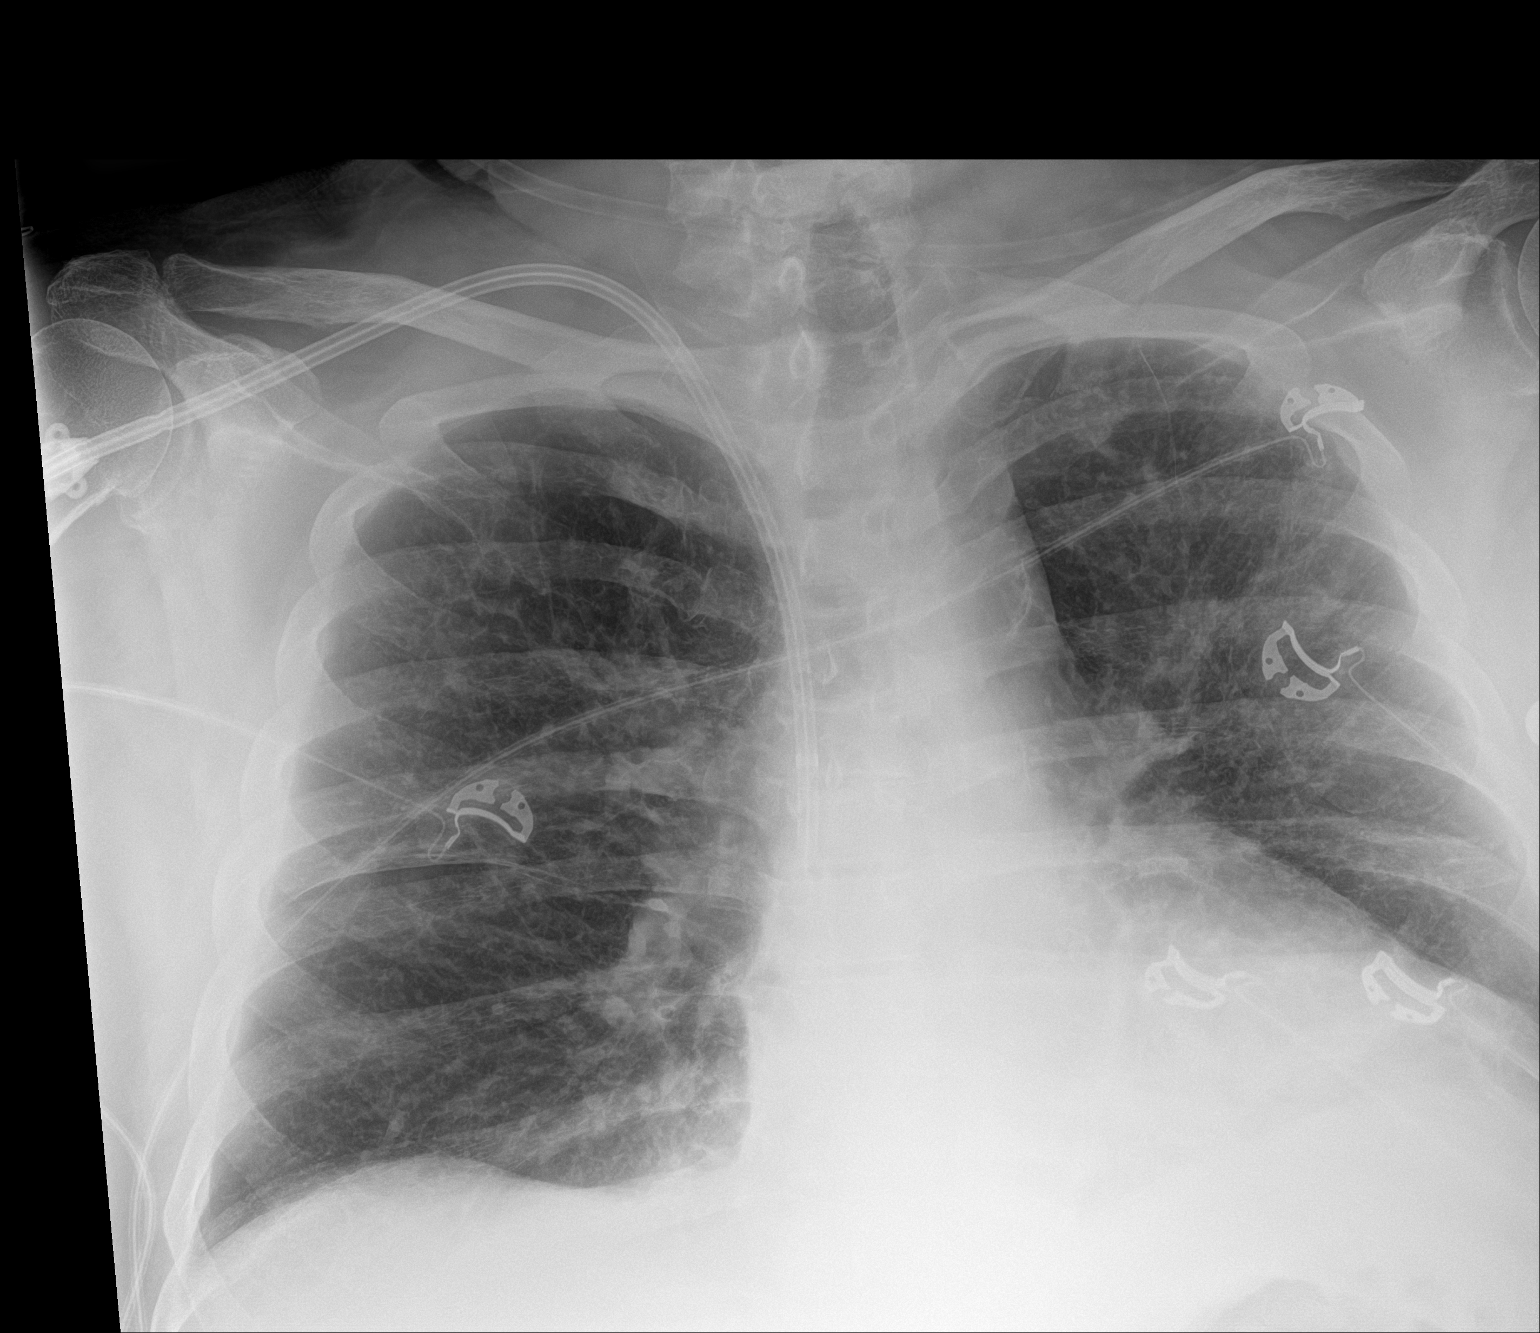

[chest ap (2 of 2)]
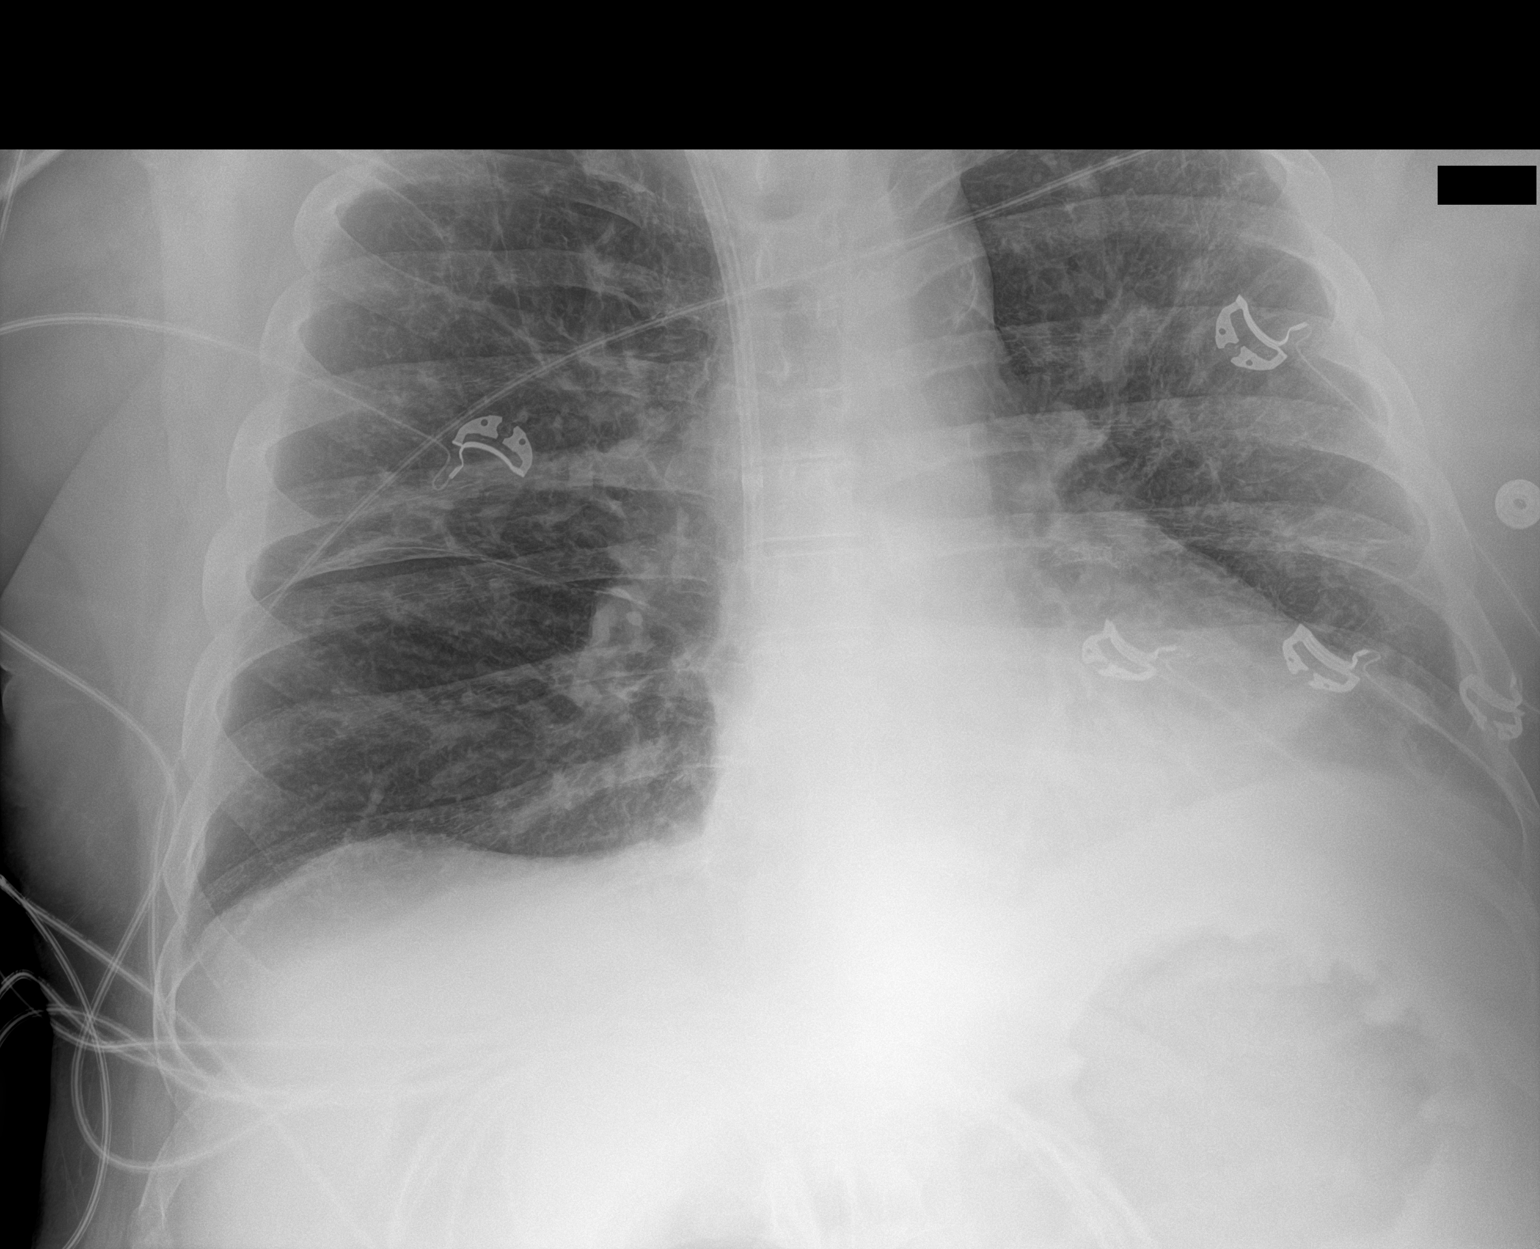

[2 of 2 positions shown; findings below may reference images not displayed]

FINDINGS: Right dialysis catheter in place, unchanged. Mild cardiomegaly and
vascular congestion. Left base opacity, likely atelectasis. No overt
edema or effusions. No acute bony abnormality.
IMPRESSION: The megaly, vascular congestion.

Left base opacity, likely atelectasis.

## 2020-09-19 ENCOUNTER — Ambulatory Visit (INDEPENDENT_AMBULATORY_CARE_PROVIDER_SITE_OTHER): Payer: Medicare Other | Admitting: Gastroenterology
